# Patient Record
Sex: Male | Born: 1965
Health system: Southern US, Community
[De-identification: ages and names within clinical notes are randomized; demographics above are authoritative.]

## PROBLEM LIST (undated history)

## (undated) DIAGNOSIS — K859 Acute pancreatitis without necrosis or infection, unspecified: Secondary | ICD-10-CM

## (undated) DIAGNOSIS — I10 Essential (primary) hypertension: Secondary | ICD-10-CM

## (undated) DIAGNOSIS — E119 Type 2 diabetes mellitus without complications: Secondary | ICD-10-CM

## (undated) DIAGNOSIS — M109 Gout, unspecified: Secondary | ICD-10-CM

## (undated) HISTORY — PX: CHOLECYSTECTOMY: SHX55

---

## 2001-08-28 ENCOUNTER — Ambulatory Visit (HOSPITAL_COMMUNITY): Admission: RE | Admit: 2001-08-28 | Discharge: 2001-08-28 | Payer: Self-pay | Admitting: Internal Medicine

## 2011-05-25 ENCOUNTER — Other Ambulatory Visit: Payer: Self-pay | Admitting: Physician Assistant

## 2011-07-16 ENCOUNTER — Other Ambulatory Visit: Payer: Self-pay | Admitting: Physician Assistant

## 2011-08-11 ENCOUNTER — Other Ambulatory Visit: Payer: Self-pay | Admitting: Physician Assistant

## 2011-08-17 ENCOUNTER — Other Ambulatory Visit: Payer: Self-pay | Admitting: Physician Assistant

## 2011-09-08 ENCOUNTER — Other Ambulatory Visit: Payer: Self-pay | Admitting: Physician Assistant

## 2011-10-30 ENCOUNTER — Other Ambulatory Visit: Payer: Self-pay | Admitting: Physician Assistant

## 2013-07-14 ENCOUNTER — Ambulatory Visit: Payer: Self-pay | Admitting: Physician Assistant

## 2014-09-17 ENCOUNTER — Ambulatory Visit: Payer: Self-pay | Admitting: Internal Medicine

## 2014-09-28 ENCOUNTER — Ambulatory Visit: Payer: Self-pay | Admitting: Family

## 2015-05-12 ENCOUNTER — Ambulatory Visit: Payer: Self-pay | Admitting: Internal Medicine

## 2018-02-17 ENCOUNTER — Ambulatory Visit: Payer: Self-pay | Admitting: Family

## 2018-09-12 ENCOUNTER — Other Ambulatory Visit: Payer: Self-pay

## 2018-09-12 ENCOUNTER — Ambulatory Visit (HOSPITAL_COMMUNITY)
Admission: EM | Admit: 2018-09-12 | Discharge: 2018-09-12 | Disposition: A | Discharge status: Home / Self Care | Payer: BC Managed Care – PPO | Attending: Family Medicine | Admitting: Family Medicine

## 2018-09-12 ENCOUNTER — Encounter (HOSPITAL_COMMUNITY): Payer: Self-pay

## 2018-09-12 DIAGNOSIS — N4889 Other specified disorders of penis: Secondary | ICD-10-CM | POA: Diagnosis present

## 2018-09-12 HISTORY — DX: Gout, unspecified: M10.9

## 2018-09-12 HISTORY — DX: Type 2 diabetes mellitus without complications: E11.9

## 2018-09-12 LAB — POCT URINALYSIS DIP (DEVICE)
Glucose, UA: 500 mg/dL — AB
Hgb urine dipstick: NEGATIVE
Ketones, ur: 15 mg/dL — AB
Leukocytes,Ua: NEGATIVE
Nitrite: NEGATIVE
Protein, ur: 30 mg/dL — AB
Specific Gravity, Urine: 1.025 (ref 1.005–1.030)
Urobilinogen, UA: 0.2 mg/dL (ref 0.0–1.0)
pH: 5.5 (ref 5.0–8.0)

## 2018-09-12 MED ORDER — CLOTRIMAZOLE 1 % EX OINT: 1.0000 "application " | TOPICAL_OINTMENT | Freq: Two times a day (BID) | CUTANEOUS | 0 refills | Status: DC

## 2018-09-12 NOTE — ED Provider Notes (Signed)
[facility]    CSN: [id number] Arrival date & time: 09/12/18  4492     History   Chief Complaint Chief Complaint  Patient presents with  . Urinary Tract Infection    HPI Caleb Chambers is a 53 y.o. male.   Caleb Chambers presents with complaints of penile irritation which started approximately 6 days ago. Throbbing discomfort. Redness to tip of penis. Painful even when he showered today, burning. Denies lesions or pustules. Denies  Any previous similar. No pain with urination. No urinary frequency but states he does have the sensation of needing to urinate. Has been drinking plenty of water. Started insulin 2.5 weeks ago and he was concerned this was possible source. He is not sexually active. Denies any regular masturbation as he states he has difficulty with arousal overall. No back pain, no abdominal pain. No penile discharge. No fevers. No drainage from the affected area.     ROS per HPI, negative if not otherwise mentioned.      Past Medical History:  Diagnosis Date  . Diabetes mellitus without complication (HCC)   . Gout     There are no active problems to display for this patient.   History reviewed. No pertinent surgical history.     Home Medications    Prior to Admission medications   Medication Sig Start Date End Date Taking? Authorizing Provider  allopurinol (ZYLOPRIM) 100 MG tablet Take 100 mg by mouth daily.   Yes [provider]  bumetanide (BUMEX) 0.5 MG tablet Take 0.5 mg by mouth daily.   Yes [provider]  insulin glargine (LANTUS) 100 UNIT/ML injection Inject into the skin daily.   Yes [provider]  lisinopril (ZESTRIL) 10 MG tablet Take 10 mg by mouth daily.   Yes [provider]  metFORMIN (GLUCOPHAGE) 850 MG tablet Take 500 mg by mouth 2 (two) times daily with a meal.    Yes [provider]  metFORMIN (GLUCOPHAGE-XR) 500 MG 24 hr tablet Take 500 mg by mouth daily with breakfast.    Yes [provider]  Clotrimazole 1 % OINT Apply 1 application topically 2 (two) times a day. 09/12/18   Georgetta Haber, NP  colchicine (COLCRYS) 0.6 MG tablet Take 1 tablet (0.6 mg total) by mouth 2 (two) times daily. Needs office visit (second notice) 10/30/11   Nelva Nay, PA-C    Family History Family History  Problem Relation Age of Onset  . Healthy Mother   . Hypertension Father     Social History Social History   Tobacco Use  . Smoking status: Never Smoker  . Smokeless tobacco: Never Used  Substance Use Topics  . Alcohol use: Not Currently  . Drug use: Not on file     Allergies   Penicillins   Review of Systems Review of Systems   Physical Exam Triage Vital Signs ED Triage Vitals  Enc Vitals Group     BP 09/12/18 0937 (!) 153/91     Pulse Rate 09/12/18 0932 (!) 111     Resp 09/12/18 0932 18     Temp 09/12/18 0932 98.8 F (37.1 C)     Temp Source 09/12/18 0932 Oral     SpO2 09/12/18 0932 100 %     Weight --      Height --      Head Circumference --      Peak Flow --      Pain Score 09/12/18 0926 0  Pain Loc --      Pain Edu? --      Excl. in GC? --    No data found.  Updated Vital Signs BP (!) 153/91   Pulse (!) 111 Comment: pt states he gets nervous in medical offices  Temp 98.8 F (37.1 C) (Oral)   Resp 18   SpO2 100%   Visual Acuity Right Eye Distance:   Left Eye Distance:   Bilateral Distance:    Right Eye Near:   Left Eye Near:    Bilateral Near:     Physical Exam Exam conducted with a chaperone present.  Constitutional:      Appearance: He is well-developed.  Cardiovascular:     Rate and Rhythm: Regular rhythm. Tachycardia present.  Pulmonary:     Effort: Pulmonary effort is normal.     Breath sounds: Normal breath sounds.  Genitourinary:    Penis: Circumcised.      Comments: Skin breakdown with redness and excoriation to glans penis in almost entire circumference of glans; no vesicles, ulceration or  pustules  Skin:    General: Skin is warm and dry.  Neurological:     Mental Status: He is alert and oriented to person, place, and time.      UC Treatments / Results  Labs (all labs ordered are listed, but only abnormal results are displayed) Labs Reviewed  POCT URINALYSIS DIP (DEVICE) - Abnormal; Notable for the following components:      Result Value   Glucose, UA 500 (*)    Bilirubin Urine SMALL (*)    Ketones, ur 15 (*)    Protein, ur 30 (*)    All other components within normal limits  URINE CYTOLOGY ANCILLARY ONLY    EKG None  Radiology No results found.  Procedures Procedures (including critical care time)  Medications Ordered in UC Medications - No data to display  Initial Impression / Assessment and Plan / UC Course  I have reviewed the triage vital signs and the nursing notes.  Pertinent labs & imaging results that were available during my care of the patient were reviewed by me and considered in my medical decision making (see chart for details).     Patient with excoriation, redness and rash to glans penis. Urine cytology collected and pending as well. Will start with clotrimazole with presence of elevated blood sugars. Low suspicion for HSV at this time. Patient endorses sensation of anxiety with being seen for this in clinic as evidenced by tachycardia, with chart review has had hr 109 in clinic in the past as well. Continue to follow with PCP as may need change in management if no improvement or if worsening. Skin care discussed. Patient verbalized understanding and agreeable to plan.   Final Clinical Impressions(s) / UC Diagnoses   Final diagnoses:  Penile irritation     Discharge Instructions     Use of cream as provided.  Avoid friction, moisture and heat as able.  Please follow up for recheck, especially if no improvement.    ED Prescriptions    Medication Sig Dispense Auth. Provider   Clotrimazole 1 % OINT Apply 1 application topically 2  (two) times a day. 1 Tube Georgetta Haber, NP     Controlled Substance Prescriptions [place] Controlled Substance Registry consulted? Not Applicable   Georgetta Haber, NP 09/12/18 1024

## 2018-09-12 NOTE — Discharge Instructions (Signed)
Use of cream as provided.  Avoid friction, moisture and heat as able.  Please follow up for recheck, especially if no improvement.

## 2018-09-12 NOTE — ED Triage Notes (Signed)
Patient presents to Urgent Care with complaints of burning sensation around his penis throughout the day since starting insulin 4 days ago. Patient reports he does not have burning DURING urination, and has not had sexual intercourse in a "very very very long time" and does not think it is a STD. Pt was also started on a diuretic and urinates very frequently now.

## 2018-09-15 LAB — URINE CYTOLOGY ANCILLARY ONLY
Chlamydia: NEGATIVE
Neisseria Gonorrhea: NEGATIVE
Trichomonas: NEGATIVE

## 2018-09-16 LAB — URINE CYTOLOGY ANCILLARY ONLY: Candida vaginitis: NEGATIVE

## 2019-04-20 ENCOUNTER — Emergency Department (HOSPITAL_COMMUNITY): Payer: BC Managed Care – PPO

## 2019-04-20 ENCOUNTER — Inpatient Hospital Stay (HOSPITAL_COMMUNITY)
Admission: EM | Admit: 2019-04-20 | Discharge: 2019-04-29 | DRG: 438 | Disposition: A | Discharge status: Home / Self Care | Payer: BC Managed Care – PPO | Attending: Internal Medicine | Admitting: Internal Medicine

## 2019-04-20 ENCOUNTER — Ambulatory Visit (HOSPITAL_COMMUNITY)
Admission: EM | Admit: 2019-04-20 | Discharge: 2019-04-20 | Disposition: A | Discharge status: Home / Self Care | Payer: BC Managed Care – PPO | Source: Home / Self Care

## 2019-04-20 ENCOUNTER — Other Ambulatory Visit: Payer: Self-pay

## 2019-04-20 ENCOUNTER — Encounter (HOSPITAL_COMMUNITY): Payer: Self-pay | Admitting: *Deleted

## 2019-04-20 DIAGNOSIS — M109 Gout, unspecified: Secondary | ICD-10-CM | POA: Diagnosis present

## 2019-04-20 DIAGNOSIS — E8809 Other disorders of plasma-protein metabolism, not elsewhere classified: Secondary | ICD-10-CM | POA: Diagnosis not present

## 2019-04-20 DIAGNOSIS — K5090 Crohn's disease, unspecified, without complications: Secondary | ICD-10-CM | POA: Diagnosis present

## 2019-04-20 DIAGNOSIS — Z6841 Body Mass Index (BMI) 40.0 and over, adult: Secondary | ICD-10-CM

## 2019-04-20 DIAGNOSIS — E8770 Fluid overload, unspecified: Secondary | ICD-10-CM | POA: Diagnosis not present

## 2019-04-20 DIAGNOSIS — T383X6A Underdosing of insulin and oral hypoglycemic [antidiabetic] drugs, initial encounter: Secondary | ICD-10-CM | POA: Diagnosis present

## 2019-04-20 DIAGNOSIS — R0602 Shortness of breath: Secondary | ICD-10-CM | POA: Diagnosis not present

## 2019-04-20 DIAGNOSIS — R188 Other ascites: Secondary | ICD-10-CM | POA: Diagnosis present

## 2019-04-20 DIAGNOSIS — Z881 Allergy status to other antibiotic agents status: Secondary | ICD-10-CM

## 2019-04-20 DIAGNOSIS — E860 Dehydration: Secondary | ICD-10-CM | POA: Diagnosis present

## 2019-04-20 DIAGNOSIS — Z794 Long term (current) use of insulin: Secondary | ICD-10-CM | POA: Diagnosis not present

## 2019-04-20 DIAGNOSIS — I10 Essential (primary) hypertension: Secondary | ICD-10-CM | POA: Diagnosis present

## 2019-04-20 DIAGNOSIS — N179 Acute kidney failure, unspecified: Secondary | ICD-10-CM | POA: Diagnosis present

## 2019-04-20 DIAGNOSIS — Z880 Allergy status to penicillin: Secondary | ICD-10-CM

## 2019-04-20 DIAGNOSIS — Z20822 Contact with and (suspected) exposure to covid-19: Secondary | ICD-10-CM | POA: Diagnosis present

## 2019-04-20 DIAGNOSIS — K851 Biliary acute pancreatitis without necrosis or infection: Secondary | ICD-10-CM | POA: Diagnosis not present

## 2019-04-20 DIAGNOSIS — Z91138 Patient's unintentional underdosing of medication regimen for other reason: Secondary | ICD-10-CM | POA: Diagnosis not present

## 2019-04-20 DIAGNOSIS — Z888 Allergy status to other drugs, medicaments and biological substances status: Secondary | ICD-10-CM

## 2019-04-20 DIAGNOSIS — K859 Acute pancreatitis without necrosis or infection, unspecified: Secondary | ICD-10-CM | POA: Diagnosis present

## 2019-04-20 DIAGNOSIS — R1013 Epigastric pain: Secondary | ICD-10-CM | POA: Diagnosis not present

## 2019-04-20 DIAGNOSIS — Z8249 Family history of ischemic heart disease and other diseases of the circulatory system: Secondary | ICD-10-CM

## 2019-04-20 DIAGNOSIS — Z9049 Acquired absence of other specified parts of digestive tract: Secondary | ICD-10-CM | POA: Diagnosis not present

## 2019-04-20 DIAGNOSIS — R935 Abnormal findings on diagnostic imaging of other abdominal regions, including retroperitoneum: Secondary | ICD-10-CM

## 2019-04-20 DIAGNOSIS — E876 Hypokalemia: Secondary | ICD-10-CM | POA: Diagnosis present

## 2019-04-20 DIAGNOSIS — K760 Fatty (change of) liver, not elsewhere classified: Secondary | ICD-10-CM | POA: Diagnosis present

## 2019-04-20 DIAGNOSIS — E1110 Type 2 diabetes mellitus with ketoacidosis without coma: Secondary | ICD-10-CM

## 2019-04-20 DIAGNOSIS — E871 Hypo-osmolality and hyponatremia: Secondary | ICD-10-CM | POA: Diagnosis not present

## 2019-04-20 DIAGNOSIS — Z79899 Other long term (current) drug therapy: Secondary | ICD-10-CM

## 2019-04-20 LAB — CBG MONITORING, ED
Glucose-Capillary: >600 mg/dL (ref 70–99)
Glucose-Capillary: >600 mg/dL (ref 70–99)
Glucose-Capillary: >600 mg/dL (ref 70–99)
Glucose-Capillary: >600 mg/dL (ref 70–99)
Glucose-Capillary: 546 mg/dL (ref 70–99)
Glucose-Capillary: 513 mg/dL (ref 70–99)
Glucose-Capillary: 425 mg/dL — ABNORMAL HIGH (ref 70–99)
Glucose-Capillary: 362 mg/dL — ABNORMAL HIGH (ref 70–99)
Glucose-Capillary: 307 mg/dL — ABNORMAL HIGH (ref 70–99)
Glucose-Capillary: 319 mg/dL — ABNORMAL HIGH (ref 70–99)

## 2019-04-20 LAB — LIPID PANEL
Cholesterol: 98 mg/dL (ref 0–200)
HDL: 39 mg/dL — ABNORMAL LOW (ref >40)
LDL Cholesterol: 45 mg/dL (ref 0–99)
Total CHOL/HDL Ratio: 2.5 RATIO
Triglycerides: 70 mg/dL (ref <150)
VLDL: 14 mg/dL (ref 0–40)

## 2019-04-20 LAB — COMPREHENSIVE METABOLIC PANEL
ALT: 45 U/L — ABNORMAL HIGH (ref 0–44)
AST: 51 U/L — ABNORMAL HIGH (ref 15–41)
Albumin: 4 g/dL (ref 3.5–5.0)
Alkaline Phosphatase: 93 U/L (ref 38–126)
Anion gap: 26 — ABNORMAL HIGH (ref 5–15)
BUN: 28 mg/dL — ABNORMAL HIGH (ref 6–20)
CO2: 10 mmol/L — ABNORMAL LOW (ref 22–32)
Calcium: 9 mg/dL (ref 8.9–10.3)
Chloride: 93 mmol/L — ABNORMAL LOW (ref 98–111)
Creatinine, Ser: 1.77 mg/dL — ABNORMAL HIGH (ref 0.61–1.24)
GFR calc Af Amer: 50 mL/min — ABNORMAL LOW (ref >60)
GFR calc non Af Amer: 43 mL/min — ABNORMAL LOW (ref >60)
Glucose, Bld: 870 mg/dL (ref 70–99)
Potassium: 5.1 mmol/L (ref 3.5–5.1)
Sodium: 129 mmol/L — ABNORMAL LOW (ref 135–145)
Total Bilirubin: 1.4 mg/dL — ABNORMAL HIGH (ref 0.3–1.2)
Total Protein: 7.9 g/dL (ref 6.5–8.1)

## 2019-04-20 LAB — POCT I-STAT EG7
Acid-base deficit: 13 mmol/L — ABNORMAL HIGH (ref 0.0–2.0)
Bicarbonate: 9.8 mmol/L — ABNORMAL LOW (ref 20.0–28.0)
Calcium, Ion: 0.84 mmol/L — CL (ref 1.15–1.40)
HCT: 56 % — ABNORMAL HIGH (ref 39.0–52.0)
Hemoglobin: 19 g/dL — ABNORMAL HIGH (ref 13.0–17.0)
O2 Saturation: 98 %
Potassium: 4.9 mmol/L (ref 3.5–5.1)
Sodium: 124 mmol/L — ABNORMAL LOW (ref 135–145)
TCO2: 10 mmol/L — ABNORMAL LOW (ref 22–32)
pCO2, Ven: 19.5 mmHg — CL (ref 44.0–60.0)
pH, Ven: 7.309 (ref 7.250–7.430)
pO2, Ven: 104 mmHg — ABNORMAL HIGH (ref 32.0–45.0)

## 2019-04-20 LAB — CBC
HCT: 55.8 % — ABNORMAL HIGH (ref 39.0–52.0)
Hemoglobin: 17.5 g/dL — ABNORMAL HIGH (ref 13.0–17.0)
MCH: 25.3 pg — ABNORMAL LOW (ref 26.0–34.0)
MCHC: 31.4 g/dL (ref 30.0–36.0)
MCV: 80.6 fL (ref 80.0–100.0)
Platelets: 302 10*3/uL (ref 150–400)
RBC: 6.92 MIL/uL — ABNORMAL HIGH (ref 4.22–5.81)
RDW: 14.6 % (ref 11.5–15.5)
WBC: 15.7 10*3/uL — ABNORMAL HIGH (ref 4.0–10.5)
nRBC: 0 % (ref 0.0–0.2)

## 2019-04-20 LAB — URINALYSIS, ROUTINE W REFLEX MICROSCOPIC
Bilirubin Urine: NEGATIVE
Glucose, UA: >=500 mg/dL — AB
Ketones, ur: 5 mg/dL — AB
Leukocytes,Ua: NEGATIVE
Nitrite: NEGATIVE
Protein, ur: NEGATIVE mg/dL
Specific Gravity, Urine: 1.022 (ref 1.005–1.030)
pH: 5 (ref 5.0–8.0)

## 2019-04-20 LAB — BASIC METABOLIC PANEL
Anion gap: 21 — ABNORMAL HIGH (ref 5–15)
BUN: 30 mg/dL — ABNORMAL HIGH (ref 6–20)
CO2: 9 mmol/L — ABNORMAL LOW (ref 22–32)
Calcium: 7.2 mg/dL — ABNORMAL LOW (ref 8.9–10.3)
Chloride: 100 mmol/L (ref 98–111)
Creatinine, Ser: 1.81 mg/dL — ABNORMAL HIGH (ref 0.61–1.24)
GFR calc Af Amer: 49 mL/min — ABNORMAL LOW (ref >60)
GFR calc non Af Amer: 42 mL/min — ABNORMAL LOW (ref >60)
Glucose, Bld: 693 mg/dL (ref 70–99)
Potassium: 5.9 mmol/L — ABNORMAL HIGH (ref 3.5–5.1)
Sodium: 130 mmol/L — ABNORMAL LOW (ref 135–145)

## 2019-04-20 LAB — BETA-HYDROXYBUTYRIC ACID
Beta-Hydroxybutyric Acid: 4.5 mmol/L — ABNORMAL HIGH (ref 0.05–0.27)
Beta-Hydroxybutyric Acid: 2.11 mmol/L — ABNORMAL HIGH (ref 0.05–0.27)

## 2019-04-20 LAB — RESPIRATORY PANEL BY RT PCR (FLU A&B, COVID)
Influenza A by PCR: NEGATIVE
Influenza B by PCR: NEGATIVE
SARS Coronavirus 2 by RT PCR: NEGATIVE

## 2019-04-20 LAB — LACTIC ACID, PLASMA: Lactic Acid, Venous: 5.4 mmol/L (ref 0.5–1.9)

## 2019-04-20 LAB — MAGNESIUM: Magnesium: 2.2 mg/dL (ref 1.7–2.4)

## 2019-04-20 LAB — LIPASE, BLOOD: Lipase: 1319 U/L — ABNORMAL HIGH (ref 11–51)

## 2019-04-20 LAB — PHOSPHORUS: Phosphorus: 3.5 mg/dL (ref 2.5–4.6)

## 2019-04-20 LAB — HIV ANTIBODY (ROUTINE TESTING W REFLEX): HIV Screen 4th Generation wRfx: NONREACTIVE

## 2019-04-20 MED ORDER — INSULIN REGULAR(HUMAN) IN NACL 100-0.9 UT/100ML-% IV SOLN
INTRAVENOUS | Status: DC
  Administered 2019-04-20: 13 [IU]/h via INTRAVENOUS
  Administered 2019-04-22: 4.8 [IU]/h via INTRAVENOUS
  Filled 2019-04-20 (×5): qty 100

## 2019-04-20 MED ORDER — HYDROMORPHONE HCL 1 MG/ML IJ SOLN
1.0000 mg | Freq: Once | INTRAMUSCULAR | Status: AC
  Administered 2019-04-20: 1 mg via INTRAVENOUS
  Filled 2019-04-20: qty 1

## 2019-04-20 MED ORDER — LACTATED RINGERS IV SOLN: INTRAVENOUS | Status: DC

## 2019-04-20 MED ORDER — NALOXONE HCL 0.4 MG/ML IJ SOLN: 0.4000 mg | INTRAMUSCULAR | Status: DC | PRN

## 2019-04-20 MED ORDER — ACETAMINOPHEN 325 MG PO TABS
650.0000 mg | ORAL_TABLET | Freq: Four times a day (QID) | ORAL | Status: DC | PRN
  Administered 2019-04-29: 650 mg via ORAL
  Filled 2019-04-20: qty 2

## 2019-04-20 MED ORDER — LACTATED RINGERS IV BOLUS: 1000.0000 mL | Freq: Once | INTRAVENOUS | Status: DC

## 2019-04-20 MED ORDER — SODIUM CHLORIDE 0.9 % IV BOLUS
2000.0000 mL | Freq: Once | INTRAVENOUS | Status: AC
  Administered 2019-04-20: 13:00:00 2000 mL via INTRAVENOUS

## 2019-04-20 MED ORDER — HEPARIN SODIUM (PORCINE) 5000 UNIT/ML IJ SOLN
5000.0000 [IU] | Freq: Three times a day (TID) | INTRAMUSCULAR | Status: DC
  Administered 2019-04-20 – 2019-04-29 (×25): 5000 [IU] via SUBCUTANEOUS
  Filled 2019-04-20 (×25): qty 1

## 2019-04-20 MED ORDER — ALLOPURINOL 100 MG PO TABS
100.0000 mg | ORAL_TABLET | Freq: Every day | ORAL | Status: DC
  Administered 2019-04-22 – 2019-04-29 (×8): 100 mg via ORAL
  Filled 2019-04-20 (×9): qty 1

## 2019-04-20 MED ORDER — HYDROMORPHONE HCL 1 MG/ML IJ SOLN: 0.5000 mg | INTRAMUSCULAR | Status: DC | PRN

## 2019-04-20 MED ORDER — DIPHENHYDRAMINE HCL 12.5 MG/5ML PO ELIX: 12.5000 mg | ORAL_SOLUTION | Freq: Four times a day (QID) | ORAL | Status: DC | PRN

## 2019-04-20 MED ORDER — LACTATED RINGERS IV BOLUS
1000.0000 mL | Freq: Once | INTRAVENOUS | Status: AC
  Administered 2019-04-20: 1000 mL via INTRAVENOUS

## 2019-04-20 MED ORDER — DEXTROSE-NACL 5-0.45 % IV SOLN: INTRAVENOUS | Status: DC

## 2019-04-20 MED ORDER — DIPHENHYDRAMINE HCL 50 MG/ML IJ SOLN: 12.5000 mg | Freq: Four times a day (QID) | INTRAMUSCULAR | Status: DC | PRN

## 2019-04-20 MED ORDER — SODIUM CHLORIDE 0.9% FLUSH
3.0000 mL | Freq: Once | INTRAVENOUS | Status: AC
  Administered 2019-04-20: 3 mL via INTRAVENOUS

## 2019-04-20 MED ORDER — HYDROMORPHONE 1 MG/ML IV SOLN
INTRAVENOUS | Status: DC
  Administered 2019-04-21 (×2): 3.8 mg via INTRAVENOUS
  Administered 2019-04-22: 0.9 mg via INTRAVENOUS
  Administered 2019-04-22: 2.1 mg via INTRAVENOUS
  Filled 2019-04-20 (×10): qty 30

## 2019-04-20 MED ORDER — ONDANSETRON HCL 4 MG/2ML IJ SOLN
4.0000 mg | Freq: Four times a day (QID) | INTRAMUSCULAR | Status: DC | PRN
  Administered 2019-04-23: 4 mg via INTRAVENOUS
  Filled 2019-04-20: qty 2

## 2019-04-20 MED ORDER — SODIUM CHLORIDE 0.9 % IV SOLN: INTRAVENOUS | Status: DC

## 2019-04-20 MED ORDER — SODIUM CHLORIDE 0.9% FLUSH: 9.0000 mL | INTRAVENOUS | Status: DC | PRN

## 2019-04-20 MED ORDER — ONDANSETRON HCL 4 MG/2ML IJ SOLN
4.0000 mg | Freq: Once | INTRAMUSCULAR | Status: AC
  Administered 2019-04-20: 4 mg via INTRAVENOUS
  Filled 2019-04-20: qty 2

## 2019-04-20 MED ORDER — DEXTROSE 50 % IV SOLN: 0.0000 mL | INTRAVENOUS | Status: DC | PRN

## 2019-04-20 MED ORDER — SODIUM CHLORIDE 0.9 % IV BOLUS
1000.0000 mL | Freq: Once | INTRAVENOUS | Status: AC
  Administered 2019-04-21: 1000 mL via INTRAVENOUS

## 2019-04-20 MED ORDER — HYDROMORPHONE HCL 1 MG/ML IJ SOLN
1.0000 mg | INTRAMUSCULAR | Status: DC | PRN
  Administered 2019-04-20 – 2019-04-21 (×4): 1 mg via INTRAVENOUS
  Filled 2019-04-20 (×4): qty 1

## 2019-04-20 MED ORDER — ONDANSETRON HCL 4 MG/2ML IJ SOLN
4.0000 mg | Freq: Four times a day (QID) | INTRAMUSCULAR | Status: DC | PRN
  Administered 2019-04-20: 4 mg via INTRAVENOUS
  Filled 2019-04-20: qty 2

## 2019-04-20 NOTE — ED Notes (Signed)
Please call mom for updates at [phone]

## 2019-04-20 NOTE — ED Triage Notes (Signed)
Pt reports having upper abd pain today with n/v. Pt feeling dehydrated and now sob.

## 2019-04-20 NOTE — Consult Note (Deleted)
Referring Provider:  [facility]         Primary Care Physician:  Patient, No Pcp Per Primary Gastroenterologist:              We were asked to see this patient for:  pancreatitis           ASSESSMENT /  PLAN     54 yo male with pmh significant for obesity, HTN, DM1, gout.   1. Acute pancreatitis with SIRS / elevated HCT and elevated BUN. Elevated HCT probably from DKA, but ? some third spacing from pancreatitis.. No acute fluid collections or necrosis on CTscan.  Etiology of pancreatitis, possibly hypertriglyceridemia.  Non-drinker and hx of cholecystectomy. Liver tests minimally elevated do doubt biliary pancreatitis. He does take an ACE inhibitor which has been associated with pancreatitis.  -- check triglycerides --Aggressive fluid resuscitation in progress. Getting 2 L bolus right now. Following that is scheduled for 75 ml / hr. Will increase that rate to 125 / hr.  --Monitor BUN, HCt -- monitor urinary output --pain control. He has Dilaudid ordered  2. DM, in DKA with metaabolic acidosis. Getting insulin drip.  3. Hyponatremia in setting of hyperglycemia. Glucose 870, corrected sodium 141  4. Hypocalemia. Ionized Ca+ low, ? Secondary to #1.   5. AKI, on CKD?   5. Severe steatosis on CTscan. Risk factors: diabetes, obesity. Minimal elevated in enzymes  HPI:    Chief Complaint:  Abdominal pain, nausea and vomitng  Caleb Chambers is a 54 y.o. male presenting to ED with two days history of nausea / vomiting and severe upper abdominal pain. He had loose stools last night but none today. In ED his white count is elevated, he is tachycardic with elevated HCT and BUN. He is in DKA. CT scan >>> pancreatitis. No previous history of pancreatitis. No FMH of pancreatitic problems. Saud says he felt fine prior to onset of pain two days ago. The pain is stabbing. It spans across upper abdomen, doesn't radiate and unrelieved with Tylenol at home.     Past Medical History:    Diagnosis Date  . Diabetes mellitus without complication (HCC)   . Gout    PSH:  cholecystectomy   Prior to Admission medications   Medication Sig Start Date End Date Taking? Authorizing Provider  allopurinol (ZYLOPRIM) 100 MG tablet Take 100 mg by mouth daily.    [provider]  bumetanide (BUMEX) 0.5 MG tablet Take 0.5 mg by mouth daily.    [provider]  Clotrimazole 1 % OINT Apply 1 application topically 2 (two) times a day. 09/12/18   Georgetta Haber, NP  colchicine (COLCRYS) 0.6 MG tablet Take 1 tablet (0.6 mg total) by mouth 2 (two) times daily. Needs office visit (second notice) 10/30/11   Nelva Nay, PA-C  insulin glargine (LANTUS) 100 UNIT/ML injection Inject into the skin daily.    [provider]  lisinopril (ZESTRIL) 10 MG tablet Take 10 mg by mouth daily.    [provider]  metFORMIN (GLUCOPHAGE) 850 MG tablet Take 500 mg by mouth 2 (two) times daily with a meal.     [provider]  metFORMIN (GLUCOPHAGE-XR) 500 MG 24 hr tablet Take 500 mg by mouth daily with breakfast.    [provider]    Current Facility-Administered Medications  Medication Dose Route Frequency Provider Last Rate Last Admin  . 0.9 %  sodium chloride infusion   Intravenous Continuous Pricilla Loveless, MD      .  acetaminophen (TYLENOL) tablet 650 mg  650 mg Oral Q6H PRN Mikey College T, MD      . Melene Muller ON 04/21/2019] allopurinol (ZYLOPRIM) tablet 100 mg  100 mg Oral Daily Zhang, Ilda Foil T, MD      . dextrose 5 %-0.45 % sodium chloride infusion   Intravenous Continuous Pricilla Loveless, MD      . dextrose 50 % solution 0-50 mL  0-50 mL Intravenous PRN Pricilla Loveless, MD      . heparin injection 5,000 Units  5,000 Units Subcutaneous Q8H Mikey College T, MD      . HYDROmorphone (DILAUDID) injection 0.5 mg  0.5 mg Intravenous Q4H PRN Mikey College T, MD      . HYDROmorphone (DILAUDID) injection 1 mg  1 mg Intravenous Q4H PRN Mikey College T, MD      .  insulin regular, human (MYXREDLIN) 100 units/ 100 mL infusion   Intravenous Continuous Pricilla Loveless, MD 13 mL/hr at 04/20/19 1446 13 Units/hr at 04/20/19 1446  . ondansetron (ZOFRAN) injection 4 mg  4 mg Intravenous Q6H PRN Emeline General, MD       Current Outpatient Medications  Medication Sig Dispense Refill  . allopurinol (ZYLOPRIM) 100 MG tablet Take 100 mg by mouth daily.    . bumetanide (BUMEX) 0.5 MG tablet Take 0.5 mg by mouth daily.    . Clotrimazole 1 % OINT Apply 1 application topically 2 (two) times a day. 1 Tube 0  . colchicine (COLCRYS) 0.6 MG tablet Take 1 tablet (0.6 mg total) by mouth 2 (two) times daily. Needs office visit (second notice) 30 tablet 0  . insulin glargine (LANTUS) 100 UNIT/ML injection Inject into the skin daily.    [name] lisinopril (ZESTRIL) 10 MG tablet Take 10 mg by mouth daily.    . metFORMIN (GLUCOPHAGE) 850 MG tablet Take 500 mg by mouth 2 (two) times daily with a meal.     . metFORMIN (GLUCOPHAGE-XR) 500 MG 24 hr tablet Take 500 mg by mouth daily with breakfast.      Allergies as of 04/20/2019 - Review Complete 04/20/2019  Allergen Reaction Noted  . Penicillins Anaphylaxis and Hives 09/12/2018    Family History  Problem Relation Age of Onset  . Healthy Mother   . Hypertension Father     Social History   Socioeconomic History  . Marital status: Single    Spouse name: Not on file  . Number of children: Not on file  . Years of education: Not on file  . Highest education level: Not on file  Occupational History  . Not on file  Tobacco Use  . Smoking status: Never Smoker  . Smokeless tobacco: Never Used  Substance and Sexual Activity  . Alcohol use: Not Currently  . Drug use: Not on file  . Sexual activity: Not Currently  Other Topics Concern  . Not on file  Social History Narrative  . Not on file   Social Determinants of Health   Financial Resource Strain:   . Difficulty of Paying Living Expenses: Not on file  Food Insecurity:   .  Worried About Programme researcher, broadcasting/film/video in the Last Year: Not on file  . Ran Out of Food in the Last Year: Not on file  Transportation Needs:   . Lack of Transportation (Medical): Not on file  . Lack of Transportation (Non-Medical): Not on file  Physical Activity:   . Days of Exercise per Week: Not on file  . Minutes of Exercise per  Session: Not on file  Stress:   . Feeling of Stress : Not on file  Social Connections:   . Frequency of Communication with Friends and Family: Not on file  . Frequency of Social Gatherings with Friends and Family: Not on file  . Attends Religious Services: Not on file  . Active Member of Clubs or Organizations: Not on file  . Attends Archivist Meetings: Not on file  . Marital Status: Not on file  Intimate Partner Violence:   . Fear of Current or Ex-Partner: Not on file  . Emotionally Abused: Not on file  . Physically Abused: Not on file  . Sexually Abused: Not on file    Review of Systems: All systems reviewed and negative except where noted in HPI.  Physical Exam: Vital signs in last 24 hours: Temp:  [97.8 F (36.6 C)] 97.8 F (36.6 C) (01/04 0844) Pulse Rate:  [111-131] 111 (01/04 1430) Resp:  [22-32] 32 (01/04 1430) BP: (127-131)/(78-87) 127/78 (01/04 1430) SpO2:  [99 %] 99 % (01/04 1430)   General:   Alert, obese male in significant physical discomfort from abdominal pain.  Psych:  Pleasant, cooperative. Normal mood and affect. Eyes:  Pupils equal, sclera clear, no icterus.   Conjunctiva pink. Ears:  Normal auditory acuity. Nose:  No deformity, discharge,  or lesions. Neck:  Supple; no masses Lungs:  Clear throughout to auscultation.   No wheezes, crackles, or rhonchi.  Heart:  Sinus tachycardia. No lower extremity edema Abdomen:  Soft, non-distended, nontender, BS active, no palp mass   Rectal:  Deferred  Msk:  Symmetrical without gross deformities. . Neurologic:  Alert and  oriented x4;  grossly normal neurologically. Skin:   Intact without significant lesions or rashes.   Intake/Output from previous day: No intake/output data recorded. Intake/Output this shift: No intake/output data recorded.  Lab Results: Recent Labs    04/20/19 0923 04/20/19 1253  WBC 15.7*  --   HGB 17.5* 19.0*  HCT 55.8* 56.0*  PLT 302  --    BMET Recent Labs    04/20/19 0923 04/20/19 1253  NA 129* 124*  K 5.1 4.9  CL 93*  --   CO2 10*  --   GLUCOSE 870*  --   BUN 28*  --   CREATININE 1.77*  --   CALCIUM 9.0  --    LFT Recent Labs    04/20/19 0923  PROT 7.9  ALBUMIN 4.0  AST 51*  ALT 45*  ALKPHOS 93  BILITOT 1.4*   PT/INR No results for input(s): LABPROT, INR in the last 72 hours. Hepatitis Panel No results for input(s): HEPBSAG, HCVAB, HEPAIGM, HEPBIGM in the last 72 hours.   . CBC Latest Ref Rng & Units 04/20/2019 04/20/2019  WBC 4.0 - 10.5 K/uL - 15.7(H)  Hemoglobin 13.0 - 17.0 g/dL 19.0(H) 17.5(H)  Hematocrit 39.0 - 52.0 % 56.0(H) 55.8(H)  Platelets 150 - 400 K/uL - 302    . CMP Latest Ref Rng & Units 04/20/2019 04/20/2019  Glucose 70 - 99 mg/dL - 870(HH)  BUN 6 - 20 mg/dL - 28(H)  Creatinine 0.61 - 1.24 mg/dL - 1.77(H)  Sodium 135 - 145 mmol/L 124(L) 129(L)  Potassium 3.5 - 5.1 mmol/L 4.9 5.1  Chloride 98 - 111 mmol/L - 93(L)  CO2 22 - 32 mmol/L - 10(L)  Calcium 8.9 - 10.3 mg/dL - 9.0  Total Protein 6.5 - 8.1 g/dL - 7.9  Total Bilirubin 0.3 - 1.2 mg/dL - 1.4(H)  Alkaline  Phos 38 - 126 U/L - 93  AST 15 - 41 U/L - 51(H)  ALT 0 - 44 U/L - 45(H)   Studies/Results: CT ABDOMEN PELVIS WO CONTRAST  Addendum Date: 04/20/2019   ADDENDUM REPORT: 04/20/2019 13:40 ADDENDUM: The patient does have a midline skin lesion over the mid sternum measuring approximately 4.0 by 1.5 cm of unknown etiology. Electronically Signed   By: Francene Boyers M.D.   On: 04/20/2019 13:40   Result Date: 04/20/2019 CLINICAL DATA:  Upper abdominal pain with nausea and vomiting. Acute pancreatitis. EXAM: CT ABDOMEN AND PELVIS WITHOUT  CONTRAST TECHNIQUE: Multidetector CT imaging of the abdomen and pelvis was performed following the standard protocol without IV contrast. COMPARISON:  None. FINDINGS: Lower chest: No acute abnormality. Tiny calcified granuloma in the right lower lobe. No effusions. Hepatobiliary: Severe hepatic steatosis. Cholecystectomy. No focal liver lesions. No biliary ductal dilatation. Pancreas: Peripancreatic soft tissue edema with edema extending into the mesentery with a small amount of ascites around the spleen and around the inferior aspect of the right lobe of the liver. Spleen: Normal in size without focal abnormality. Adrenals/Urinary Tract: Adrenal glands are unremarkable. Kidneys are normal, without renal calculi, focal lesion, or hydronephrosis. Bladder is unremarkable. Stomach/Bowel: Stomach is within normal limits. Appendix appears normal. No evidence of bowel wall thickening, distention, or inflammatory changes. Vascular/Lymphatic: No significant vascular findings are present. No enlarged abdominal or pelvic lymph nodes. Reproductive: Prostate is unremarkable. Other: There is a small amount of ascites pericolic gutters and adjacent to the spleen and inferior aspect the liver as well as mesenteric edema, all felt pancreatitis. No abdominal wall hernias. Musculoskeletal: No acute or significant osseous findings. IMPRESSION: 1. Acute pancreatitis. 2. Severe hepatic steatosis. 3. Small amount of ascites. Electronically Signed: By: Francene Boyers M.D. On: 04/20/2019 13:14   DG Chest 2 View  Result Date: 04/20/2019 CLINICAL DATA:  Shortness of breath and dizziness EXAM: CHEST - 2 VIEW COMPARISON:  None. FINDINGS: Lungs are clear. Heart is upper normal in size with pulmonary vascularity normal. No adenopathy. No bone lesions. IMPRESSION: No edema or consolidation.  Heart upper normal in size. Electronically Signed   By: Bretta Bang III M.D.   On: 04/20/2019 10:03    Active Problems:   Acute pancreatitis    DKA (diabetic ketoacidoses) (HCC)    Willette Cluster, NP-C @  04/20/2019, 3:14 PM

## 2019-04-20 NOTE — H&P (Signed)
History and Physical    Caleb Chambers KZS:[id number] DOB: [birth year] DOA: 04/20/2019  PCP: Patient, No Pcp Per   Patient coming from: Home  I have personally briefly reviewed patient's old medical records in [facility]  Chief Complaint: ABd pain, nauseous vomiting diarrhea  HPI: Caleb Chambers is a 54 y.o. male with medical history significant of IDDM, HTN, abdominal pain and vomiting.  Started 2-3 days ago after heavy meal.  Pain is diffuse across his upper abdomen and severe, cramping like.  He has not taken anything specifically for it.  He has had 20-30 episodes of vomiting and loose diarrhea as well.  No fevers or cough. He stopped taking all his meds, including insulin and the Metformin since yesterday. ED Course: DKA, insulin drip was started. CT showed acute uncomplicated pancreatitis.  Review of Systems: As per HPI otherwise 10 point review of systems negative.    Past Medical History:  Diagnosis Date  . Diabetes mellitus without complication (HCC)   . Gout     History reviewed. No pertinent surgical history.   reports that he has never smoked. He has never used smokeless tobacco. He reports previous alcohol use. No history on file for drug.  Allergies  Allergen Reactions  . Penicillins Anaphylaxis and Hives    Family History  Problem Relation Age of Onset  . Healthy Mother   . Hypertension Father      Prior to Admission medications   Medication Sig Start Date End Date Taking? Authorizing Provider  allopurinol (ZYLOPRIM) 100 MG tablet Take 100 mg by mouth daily.    [provider]  bumetanide (BUMEX) 0.5 MG tablet Take 0.5 mg by mouth daily.    [provider]  Clotrimazole 1 % OINT Apply 1 application topically 2 (two) times a day. 09/12/18   Georgetta Haber, NP  colchicine (COLCRYS) 0.6 MG tablet Take 1 tablet (0.6 mg total) by mouth 2 (two) times daily. Needs office visit (second notice) 10/30/11   Nelva Nay, PA-C  insulin  glargine (LANTUS) 100 UNIT/ML injection Inject into the skin daily.    [provider]  lisinopril (ZESTRIL) 10 MG tablet Take 10 mg by mouth daily.    [provider]  metFORMIN (GLUCOPHAGE) 850 MG tablet Take 500 mg by mouth 2 (two) times daily with a meal.     [provider]  metFORMIN (GLUCOPHAGE-XR) 500 MG 24 hr tablet Take 500 mg by mouth daily with breakfast.    [provider]    Physical Exam: Vitals:   04/20/19 0844  BP: 131/87  Pulse: (!) 131  Resp: (!) 22  Temp: 97.8 F (36.6 C)  TempSrc: Oral  SpO2: 99%    Constitutional: NAD, calm, comfortable Vitals:   04/20/19 0844  BP: 131/87  Pulse: (!) 131  Resp: (!) 22  Temp: 97.8 F (36.6 C)  TempSrc: Oral  SpO2: 99%   Eyes: PERRL, lids and conjunctivae normal ENMT: Mucous membranes are dry. Posterior pharynx clear of any exudate or lesions.Normal dentition.  Neck: normal, supple, no masses, no thyromegaly Respiratory: clear to auscultation bilaterally, no wheezing, no crackles. Normal respiratory effort. No accessory muscle use.  Cardiovascular: Regular rate and rhythm, no murmurs / rubs / gallops. No extremity edema. 2+ pedal pulses. No carotid bruits.  Abdomen: severe tenderness on epigastric area, no rebound no guarding, no masses palpated. No hepatosplenomegaly. Bowel sounds positive.  Musculoskeletal: no clubbing / cyanosis. No joint deformity upper and lower extremities. Good  ROM, no contractures. Normal muscle tone.  Skin: no rashes, lesions, ulcers. No induration Neurologic: CN 2-12 grossly intact. Sensation intact, DTR normal. Strength 5/5 in all 4.  Psychiatric: Normal judgment and insight. Alert and oriented x 3. Normal mood.     Labs on Admission: I have personally reviewed following labs and imaging studies  CBC: Recent Labs  Lab 04/20/19 0923 04/20/19 1253  WBC 15.7*  --   HGB 17.5* 19.0*  HCT 55.8* 56.0*  MCV 80.6  --   PLT 302  --    Basic Metabolic  Panel: Recent Labs  Lab 04/20/19 0923 04/20/19 1253  NA 129* 124*  K 5.1 4.9  CL 93*  --   CO2 10*  --   GLUCOSE 870*  --   BUN 28*  --   CREATININE 1.77*  --   CALCIUM 9.0  --    GFR: CrCl cannot be calculated (Unknown ideal weight.). Liver Function Tests: Recent Labs  Lab 04/20/19 0923  AST 51*  ALT 45*  ALKPHOS 93  BILITOT 1.4*  PROT 7.9  ALBUMIN 4.0   Recent Labs  Lab 04/20/19 0923  LIPASE 1,319*   No results for input(s): AMMONIA in the last 168 hours. Coagulation Profile: No results for input(s): INR, PROTIME in the last 168 hours. Cardiac Enzymes: No results for input(s): CKTOTAL, CKMB, CKMBINDEX, TROPONINI in the last 168 hours. BNP (last 3 results) No results for input(s): PROBNP in the last 8760 hours. HbA1C: No results for input(s): HGBA1C in the last 72 hours. CBG: Recent Labs  Lab 04/20/19 1215  GLUCAP >600*   Lipid Profile: No results for input(s): CHOL, HDL, LDLCALC, TRIG, CHOLHDL, LDLDIRECT in the last 72 hours. Thyroid Function Tests: No results for input(s): TSH, T4TOTAL, FREET4, T3FREE, THYROIDAB in the last 72 hours. Anemia Panel: No results for input(s): VITAMINB12, FOLATE, FERRITIN, TIBC, IRON, RETICCTPCT in the last 72 hours. Urine analysis:    Component Value Date/Time   LABSPEC 1.025 09/12/2018 0949   PHURINE 5.5 09/12/2018 0949   GLUCOSEU 500 (A) 09/12/2018 0949   HGBUR NEGATIVE 09/12/2018 0949   BILIRUBINUR SMALL (A) 09/12/2018 0949   KETONESUR 15 (A) 09/12/2018 0949   PROTEINUR 30 (A) 09/12/2018 0949   UROBILINOGEN 0.2 09/12/2018 0949   NITRITE NEGATIVE 09/12/2018 0949   LEUKOCYTESUR NEGATIVE 09/12/2018 0949    Radiological Exams on Admission: CT ABDOMEN PELVIS WO CONTRAST  Addendum Date: 04/20/2019   ADDENDUM REPORT: 04/20/2019 13:40 ADDENDUM: The patient does have a midline skin lesion over the mid sternum measuring approximately 4.0 by 1.5 cm of unknown etiology. Electronically Signed   By: Francene Boyers M.D.   On:  04/20/2019 13:40   Result Date: 04/20/2019 CLINICAL DATA:  Upper abdominal pain with nausea and vomiting. Acute pancreatitis. EXAM: CT ABDOMEN AND PELVIS WITHOUT CONTRAST TECHNIQUE: Multidetector CT imaging of the abdomen and pelvis was performed following the standard protocol without IV contrast. COMPARISON:  None. FINDINGS: Lower chest: No acute abnormality. Tiny calcified granuloma in the right lower lobe. No effusions. Hepatobiliary: Severe hepatic steatosis. Cholecystectomy. No focal liver lesions. No biliary ductal dilatation. Pancreas: Peripancreatic soft tissue edema with edema extending into the mesentery with a small amount of ascites around the spleen and around the inferior aspect of the right lobe of the liver. Spleen: Normal in size without focal abnormality. Adrenals/Urinary Tract: Adrenal glands are unremarkable. Kidneys are normal, without renal calculi, focal lesion, or hydronephrosis. Bladder is unremarkable. Stomach/Bowel: Stomach is within normal limits. Appendix appears normal. No  evidence of bowel wall thickening, distention, or inflammatory changes. Vascular/Lymphatic: No significant vascular findings are present. No enlarged abdominal or pelvic lymph nodes. Reproductive: Prostate is unremarkable. Other: There is a small amount of ascites pericolic gutters and adjacent to the spleen and inferior aspect the liver as well as mesenteric edema, all felt pancreatitis. No abdominal wall hernias. Musculoskeletal: No acute or significant osseous findings. IMPRESSION: 1. Acute pancreatitis. 2. Severe hepatic steatosis. 3. Small amount of ascites. Electronically Signed: By: Lorriane Shire M.D. On: 04/20/2019 13:14   DG Chest 2 View  Result Date: 04/20/2019 CLINICAL DATA:  Shortness of breath and dizziness EXAM: CHEST - 2 VIEW COMPARISON:  None. FINDINGS: Lungs are clear. Heart is upper normal in size with pulmonary vascularity normal. No adenopathy. No bone lesions. IMPRESSION: No edema or  consolidation.  Heart upper normal in size. Electronically Signed   By: Lowella Grip III M.D.   On: 04/20/2019 10:03    EKG: Independently reviewed.   Assessment/Plan Active Problems:   Acute pancreatitis   DKA (diabetic ketoacidoses) (HCC)   Acute pancreatitis, BISAP score 2 (SIRS and elevated BUN), status post cystectomy and is nondrinker, will check triglyceride.  Discussed with GI PA, who will see the patient and give more recommendations.  N.p.o., fluid and pain meds Zofran.  SIRS, elevated WBC count, partly likely from hemoconcentration and patient's Hb lab also elevated. CT however shows no significant necrotizing lesions, x-ray showed no significant infiltrates will hold off antibiotics.  DKA, insulin drip for now, NPO.  HTN, hold off his home BP meds, until SIRS resolved.  No anion gap metabolic acidosis, from continuous nauseous vomiting diarrhea, fluids and supportive care.  AKI, likely from dehydration, will hold colchicine    DVT prophylaxis: Heparin SubQ Code Status: Full Family Communication: Mother over phone Disposition Plan: Home Consults called: [place] Admission status: PCU for insulin drip   Lequita Halt MD [facility] Pager [phone]  If 7PM-7AM, please contact night-coverage [email or web address] Password [email or web address]  04/20/2019, 2:20 PM

## 2019-04-20 NOTE — ED Notes (Signed)
Patient left before being called for registration, did not tell staff he was leaving, no answer after being called x3.

## 2019-04-20 NOTE — ED Provider Notes (Signed)
[facility] EMERGENCY DEPARTMENT Provider Note   CSN: [id number] Arrival date & time: 04/20/19  5284     History Chief Complaint  Patient presents with  . Abdominal Pain  . Emesis  . Shortness of Breath    Caleb Chambers is a 54 y.o. male.  HPI  54 year old male presents with abdominal pain and vomiting.  Started a couple days ago.  Pain is diffuse across his upper abdomen and severe.  He has not taken anything specifically for it.  He has had numerous episodes of vomiting and diarrhea as well.  No fevers or cough.  Denies any alcohol use.  Past Medical History:  Diagnosis Date  . Diabetes mellitus without complication (HCC)   . Gout     There are no problems to display for this patient.   History reviewed. No pertinent surgical history.     Family History  Problem Relation Age of Onset  . Healthy Mother   . Hypertension Father     Social History   Tobacco Use  . Smoking status: Never Smoker  . Smokeless tobacco: Never Used  Substance Use Topics  . Alcohol use: Not Currently  . Drug use: Not on file    Home Medications Prior to Admission medications   Medication Sig Start Date End Date Taking? Authorizing Provider  allopurinol (ZYLOPRIM) 100 MG tablet Take 100 mg by mouth daily.    [provider]  bumetanide (BUMEX) 0.5 MG tablet Take 0.5 mg by mouth daily.    [provider]  Clotrimazole 1 % OINT Apply 1 application topically 2 (two) times a day. 09/12/18   Georgetta Haber, NP  colchicine (COLCRYS) 0.6 MG tablet Take 1 tablet (0.6 mg total) by mouth 2 (two) times daily. Needs office visit (second notice) 10/30/11   Nelva Nay, PA-C  insulin glargine (LANTUS) 100 UNIT/ML injection Inject into the skin daily.    [provider]  lisinopril (ZESTRIL) 10 MG tablet Take 10 mg by mouth daily.    [provider]  metFORMIN (GLUCOPHAGE) 850 MG tablet Take 500 mg by mouth 2 (two) times daily with a meal.      [provider]  metFORMIN (GLUCOPHAGE-XR) 500 MG 24 hr tablet Take 500 mg by mouth daily with breakfast.    [provider]    Allergies    Penicillins  Review of Systems   Review of Systems  Constitutional: Negative for fever.  Respiratory: Negative for cough and shortness of breath.   Gastrointestinal: Positive for abdominal pain, diarrhea, nausea and vomiting.  All other systems reviewed and are negative.   Physical Exam Updated Vital Signs BP 131/87 (BP Location: Right Arm)   Pulse (!) 131   Temp 97.8 F (36.6 C) (Oral)   Resp (!) 22   SpO2 99%   Physical Exam Vitals and nursing note reviewed.  Constitutional:      General: He is in acute distress.     Appearance: He is well-developed. He is not diaphoretic.  HENT:     Head: Normocephalic and atraumatic.     Right Ear: External ear normal.     Left Ear: External ear normal.     Nose: Nose normal.  Eyes:     General:        Right eye: No discharge.        Left eye: No discharge.  Cardiovascular:     Rate and Rhythm: Regular rhythm. Tachycardia present.  Heart sounds: Normal heart sounds.  Pulmonary:     Effort: Pulmonary effort is normal.     Breath sounds: Normal breath sounds.  Abdominal:     Palpations: Abdomen is soft.     Tenderness: There is abdominal tenderness in the right upper quadrant, epigastric area and left upper quadrant.  Musculoskeletal:     Cervical back: Neck supple.  Skin:    General: Skin is warm and dry.  Neurological:     Mental Status: He is alert.  Psychiatric:        Mood and Affect: Mood is not anxious.     ED Results / Procedures / Treatments   Labs (all labs ordered are listed, but only abnormal results are displayed) Labs Reviewed  LIPASE, BLOOD - Abnormal; Notable for the following components:      Result Value   Lipase 1,319 (*)    All other components within normal limits  COMPREHENSIVE METABOLIC PANEL - Abnormal; Notable for the following  components:   Sodium 129 (*)    Chloride 93 (*)    CO2 10 (*)    Glucose, Bld 870 (*)    BUN 28 (*)    Creatinine, Ser 1.77 (*)    AST 51 (*)    ALT 45 (*)    Total Bilirubin 1.4 (*)    GFR calc non Af Amer 43 (*)    GFR calc Af Amer 50 (*)    Anion gap 26 (*)    All other components within normal limits  CBC - Abnormal; Notable for the following components:   WBC 15.7 (*)    RBC 6.92 (*)    Hemoglobin 17.5 (*)    HCT 55.8 (*)    MCH 25.3 (*)    All other components within normal limits  CULTURE, BLOOD (ROUTINE X 2)  CULTURE, BLOOD (ROUTINE X 2)  URINALYSIS, ROUTINE W REFLEX MICROSCOPIC  LACTIC ACID, PLASMA  LACTIC ACID, PLASMA  BETA-HYDROXYBUTYRIC ACID  I-STAT VENOUS BLOOD GAS, ED    EKG EKG Interpretation  Date/Time:  Monday April 20 2019 09:14:32 EST Ventricular Rate:  135 PR Interval:  144 QRS Duration: 88 QT Interval:  290 QTC Calculation: 435 R Axis:   -73 Text Interpretation: Sinus tachycardia Left anterior fascicular block Possible Lateral infarct , age undetermined Abnormal ECG No old tracing to compare Confirmed by Pricilla Loveless [phone]) on 04/20/2019 11:19:30 AM   Radiology DG Chest 2 View  Result Date: 04/20/2019 CLINICAL DATA:  Shortness of breath and dizziness EXAM: CHEST - 2 VIEW COMPARISON:  None. FINDINGS: Lungs are clear. Heart is upper normal in size with pulmonary vascularity normal. No adenopathy. No bone lesions. IMPRESSION: No edema or consolidation.  Heart upper normal in size. Electronically Signed   By: Bretta Bang III M.D.   On: 04/20/2019 10:03    Procedures .Critical Care Performed by: Pricilla Loveless, MD Authorized by: Pricilla Loveless, MD   Critical care provider statement:    Critical care time (minutes):  35   Critical care time was exclusive of:  Separately billable procedures and treating other patients   Critical care was necessary to treat or prevent imminent or life-threatening deterioration of the following  conditions:  Endocrine crisis   Critical care was time spent personally by me on the following activities:  Discussions with consultants, evaluation of patient's response to treatment, examination of patient, ordering and performing treatments and interventions, ordering and review of laboratory studies, ordering and review of radiographic studies, pulse  oximetry, re-evaluation of patient's condition, obtaining history from patient or surrogate and review of old charts   (including critical care time)  Medications Ordered in ED Medications  sodium chloride flush (NS) 0.9 % injection 3 mL (has no administration in time range)  sodium chloride 0.9 % bolus 2,000 mL (has no administration in time range)  ondansetron (ZOFRAN) injection 4 mg (has no administration in time range)  HYDROmorphone (DILAUDID) injection 1 mg (has no administration in time range)  lactated ringers bolus 1,000 mL (has no administration in time range)  lactated ringers bolus 1,000 mL (has no administration in time range)    ED Course  I have reviewed the triage vital signs and the nursing notes.  Pertinent labs & imaging results that were available during my care of the patient were reviewed by me and considered in my medical decision making (see chart for details).    MDM Rules/Calculators/A&P                      Patient appears to have acute pancreatitis, no clear cause.  This seems to be explaining his pain/vomiting.  CT otherwise unremarkable.  He is also in DKA though somewhat compensating.  He was given IV fluid boluses and started on insulin drip.  Potassium initially okay.  Admit to hospitalist service.  Caleb Chambers was evaluated in Emergency Department on 04/20/2019 for the symptoms described in the history of present illness. He was evaluated in the context of the global COVID-19 pandemic, which necessitated consideration that the patient might be at risk for infection with the SARS-CoV-2 virus that causes  COVID-19. Institutional protocols and algorithms that pertain to the evaluation of patients at risk for COVID-19 are in a state of rapid change based on information released by regulatory bodies including the CDC and federal and state organizations. These policies and algorithms were followed during the patient's care in the ED.  Final Clinical Impression(s) / ED Diagnoses Final diagnoses:  Acute pancreatitis without infection or necrosis, unspecified pancreatitis type  Diabetic ketoacidosis without coma associated with type 2 diabetes mellitus ([place])  Acute kidney injury [facility])    Rx / DC Orders ED Discharge Orders    None       Sherwood Gambler, MD 04/20/19 1645

## 2019-04-20 NOTE — ED Notes (Signed)
Glucose 870, notified Langston Masker, [place] for further orders.

## 2019-04-20 NOTE — Consult Note (Signed)
Reason for Consult: Acute pancreatitis Referring Physician: [facility]  [facility] [place] HPI: This is a 54 year old male who is well-known to me for a PMH of Crohn's disease on Humira and DM.  Two days ago he started to have some upper abdominal pain and the pain continued to worsen.  It was associated with severe nausea and vomiting.  He was brought to the hospital by his family, but he was initially unwilling to come to the hospital.  Upon arrival the patient was noted to be in severe abdominal pain, tachycardic, and tachypneic.  He was normotensive.  Over Christmas the patient states that he ate very poorly and his diabetes was under poor control.  Blood work in the ER showed an elevated in her lipase at 1,319, elevated creatinine at 1.77 (baseline around .7), HCT at 55.8, with an increase to 56.0, and glucose at 870.  He was diagnosed with an acute pancreatitis and DKA.  He received two liters of normal saline in the ER.  His CT scan shows an acute pancreatitis with hepatic steatosis and some mild ascites.  Past Medical History:  Diagnosis Date  . Diabetes mellitus without complication (HCC)   . Gout     History reviewed. No pertinent surgical history.  Family History  Problem Relation Age of Onset  . Healthy Mother   . Hypertension Father     Social History:  reports that he has never smoked. He has never used smokeless tobacco. He reports previous alcohol use. No history on file for drug.  Allergies:  Allergies  Allergen Reactions  . Penicillins Anaphylaxis and Hives    Medications:  Scheduled: . [START ON 04/21/2019] allopurinol  100 mg Oral Daily  . heparin  5,000 Units Subcutaneous Q8H  . HYDROmorphone   Intravenous Q4H   Continuous: . sodium chloride 125 mL/hr at 04/20/19 1600  . dextrose 5 % and 0.45% NaCl    . insulin 13 Units/hr (04/20/19 1610)  . lactated ringers    . lactated ringers      Results for orders placed or performed during the hospital encounter  of 04/20/19 (from the past 24 hour(s))  Lipase, blood     Status: Abnormal   Collection Time: 04/20/19  9:23 AM  Result Value Ref Range   Lipase 1,319 (H) 11 - 51 U/L  Comprehensive metabolic panel     Status: Abnormal   Collection Time: 04/20/19  9:23 AM  Result Value Ref Range   Sodium 129 (L) 135 - 145 mmol/L   Potassium 5.1 3.5 - 5.1 mmol/L   Chloride 93 (L) 98 - 111 mmol/L   CO2 10 (L) 22 - 32 mmol/L   Glucose, Bld 870 (HH) 70 - 99 mg/dL   BUN 28 (H) 6 - 20 mg/dL   Creatinine, Ser 1.61 (H) 0.61 - 1.24 mg/dL   Calcium 9.0 8.9 - 09.6 mg/dL   Total Protein 7.9 6.5 - 8.1 g/dL   Albumin 4.0 3.5 - 5.0 g/dL   AST 51 (H) 15 - 41 U/L   ALT 45 (H) 0 - 44 U/L   Alkaline Phosphatase 93 38 - 126 U/L   Total Bilirubin 1.4 (H) 0.3 - 1.2 mg/dL   GFR calc non Af Amer 43 (L) >60 mL/min   GFR calc Af Amer 50 (L) >60 mL/min   Anion gap 26 (H) 5 - 15  CBC     Status: Abnormal   Collection Time: 04/20/19  9:23 AM  Result Value  Ref Range   WBC 15.7 (H) 4.0 - 10.5 K/uL   RBC 6.92 (H) 4.22 - 5.81 MIL/uL   Hemoglobin 17.5 (H) 13.0 - 17.0 g/dL   HCT 50.5 (H) 39.7 - 67.3 %   MCV 80.6 80.0 - 100.0 fL   MCH 25.3 (L) 26.0 - 34.0 pg   MCHC 31.4 30.0 - 36.0 g/dL   RDW 41.9 37.9 - 02.4 %   Platelets 302 150 - 400 K/uL   nRBC 0.0 0.0 - 0.2 %  CBG monitoring, ED     Status: Abnormal   Collection Time: 04/20/19 12:15 PM  Result Value Ref Range   Glucose-Capillary >600 (HH) 70 - 99 mg/dL   Comment 1 Notify RN    Comment 2 Document in Chart   Beta-hydroxybutyric acid     Status: Abnormal   Collection Time: 04/20/19 12:38 PM  Result Value Ref Range   Beta-Hydroxybutyric Acid 4.50 (H) 0.05 - 0.27 mmol/L  POCT I-Stat EG7     Status: Abnormal   Collection Time: 04/20/19 12:53 PM  Result Value Ref Range   pH, Ven 7.309 7.250 - 7.430   pCO2, Ven 19.5 (LL) 44.0 - 60.0 mmHg   pO2, Ven 104.0 (H) 32.0 - 45.0 mmHg   Bicarbonate 9.8 (L) 20.0 - 28.0 mmol/L   TCO2 10 (L) 22 - 32 mmol/L   O2 Saturation 98.0 %    Acid-base deficit 13.0 (H) 0.0 - 2.0 mmol/L   Sodium 124 (L) 135 - 145 mmol/L   Potassium 4.9 3.5 - 5.1 mmol/L   Calcium, Ion 0.84 (LL) 1.15 - 1.40 mmol/L   HCT 56.0 (H) 39.0 - 52.0 %   Hemoglobin 19.0 (H) 13.0 - 17.0 g/dL   Patient temperature HIDE    Sample type VENOUS    Comment NOTIFIED PHYSICIAN   CBG monitoring, ED     Status: Abnormal   Collection Time: 04/20/19  2:43 PM  Result Value Ref Range   Glucose-Capillary >600 (HH) 70 - 99 mg/dL  CBG monitoring, ED     Status: Abnormal   Collection Time: 04/20/19  3:35 PM  Result Value Ref Range   Glucose-Capillary >600 (HH) 70 - 99 mg/dL   Comment 1 Document in Chart      CT ABDOMEN PELVIS WO CONTRAST  Addendum Date: 04/20/2019   ADDENDUM REPORT: 04/20/2019 13:40 ADDENDUM: The patient does have a midline skin lesion over the mid sternum measuring approximately 4.0 by 1.5 cm of unknown etiology. Electronically Signed   By: Francene Boyers M.D.   On: 04/20/2019 13:40   Result Date: 04/20/2019 CLINICAL DATA:  Upper abdominal pain with nausea and vomiting. Acute pancreatitis. EXAM: CT ABDOMEN AND PELVIS WITHOUT CONTRAST TECHNIQUE: Multidetector CT imaging of the abdomen and pelvis was performed following the standard protocol without IV contrast. COMPARISON:  None. FINDINGS: Lower chest: No acute abnormality. Tiny calcified granuloma in the right lower lobe. No effusions. Hepatobiliary: Severe hepatic steatosis. Cholecystectomy. No focal liver lesions. No biliary ductal dilatation. Pancreas: Peripancreatic soft tissue edema with edema extending into the mesentery with a small amount of ascites around the spleen and around the inferior aspect of the right lobe of the liver. Spleen: Normal in size without focal abnormality. Adrenals/Urinary Tract: Adrenal glands are unremarkable. Kidneys are normal, without renal calculi, focal lesion, or hydronephrosis. Bladder is unremarkable. Stomach/Bowel: Stomach is within normal limits. Appendix appears  normal. No evidence of bowel wall thickening, distention, or inflammatory changes. Vascular/Lymphatic: No significant vascular findings are present. No enlarged  abdominal or pelvic lymph nodes. Reproductive: Prostate is unremarkable. Other: There is a small amount of ascites pericolic gutters and adjacent to the spleen and inferior aspect the liver as well as mesenteric edema, all felt pancreatitis. No abdominal wall hernias. Musculoskeletal: No acute or significant osseous findings. IMPRESSION: 1. Acute pancreatitis. 2. Severe hepatic steatosis. 3. Small amount of ascites. Electronically Signed: By: Lorriane Shire M.D. On: 04/20/2019 13:14   DG Chest 2 View  Result Date: 04/20/2019 CLINICAL DATA:  Shortness of breath and dizziness EXAM: CHEST - 2 VIEW COMPARISON:  None. FINDINGS: Lungs are clear. Heart is upper normal in size with pulmonary vascularity normal. No adenopathy. No bone lesions. IMPRESSION: No edema or consolidation.  Heart upper normal in size. Electronically Signed   By: Lowella Grip III M.D.   On: 04/20/2019 10:03    ROS:  As stated above in the HPI otherwise negative.  Blood pressure 126/80, pulse (!) 120, temperature 97.8 F (36.6 C), temperature source Oral, resp. rate (!) 34, SpO2 93 %.    PE: Gen: Alert and Oriented, very uncomfortable, writhing at times in pain HEENT:  [place]/AT, EOMI Neck: Supple, no LAD Lungs: CTA Bilaterally CV: RRR without M/G/R ABM: Soft, tender in the upper abdomen, +BS Ext: No C/C/E  Assessment/Plan: 1) Acute pancreatitis. 2) ABM pain. 3) DKA.   The patient has a severe pancreatitis.  The etiology may be his uncontrolled DM, hypertriglyceridemia, or medications.  He is well-known to me and he does not use any illicit substances nor does he drink ETOH.  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  The patient is very uncomfortable and he will need to be treated aggressively with more IV hydration.  His HCT  increased up to 56, but it is unclear when he received the IV hydration in relation to the blood draw.  He will receive another liter of fluids, but he will receive LR this time.  The baseline IV hydration will be 300 ml/kg/hour at 3 ml/kg/hour.  Another liter of LR may need to be bolused depending on his blood work and response.  Part of the hemoconcentration is as a result of the DKA.  His baseline HGB is typically around 11 g/dL (HCT of 33).    Plan: 1) Aggressive IV hydration as outlined. 2) Dilaudid PCA. 3) NPO. 4) DKA management per Hospitalist. Latif Nazareno D 04/20/2019, 4:43 PM

## 2019-04-20 NOTE — ED Notes (Signed)
Glucose   693

## 2019-04-20 NOTE — ED Notes (Signed)
Got patient on there monitor patient is resting with call bell in reach  

## 2019-04-21 ENCOUNTER — Encounter (HOSPITAL_COMMUNITY): Payer: Self-pay | Admitting: Internal Medicine

## 2019-04-21 DIAGNOSIS — I10 Essential (primary) hypertension: Secondary | ICD-10-CM

## 2019-04-21 DIAGNOSIS — E871 Hypo-osmolality and hyponatremia: Secondary | ICD-10-CM

## 2019-04-21 LAB — CBG MONITORING, ED
Glucose-Capillary: 135 mg/dL — ABNORMAL HIGH (ref 70–99)
Glucose-Capillary: 152 mg/dL — ABNORMAL HIGH (ref 70–99)
Glucose-Capillary: 170 mg/dL — ABNORMAL HIGH (ref 70–99)
Glucose-Capillary: 174 mg/dL — ABNORMAL HIGH (ref 70–99)
Glucose-Capillary: 175 mg/dL — ABNORMAL HIGH (ref 70–99)
Glucose-Capillary: 194 mg/dL — ABNORMAL HIGH (ref 70–99)
Glucose-Capillary: 205 mg/dL — ABNORMAL HIGH (ref 70–99)
Glucose-Capillary: 208 mg/dL — ABNORMAL HIGH (ref 70–99)
Glucose-Capillary: 209 mg/dL — ABNORMAL HIGH (ref 70–99)
Glucose-Capillary: 216 mg/dL — ABNORMAL HIGH (ref 70–99)
Glucose-Capillary: 241 mg/dL — ABNORMAL HIGH (ref 70–99)
Glucose-Capillary: 246 mg/dL — ABNORMAL HIGH (ref 70–99)

## 2019-04-21 LAB — COMPREHENSIVE METABOLIC PANEL
ALT: 40 U/L (ref 0–44)
AST: 89 U/L — ABNORMAL HIGH (ref 15–41)
Albumin: 2.9 g/dL — ABNORMAL LOW (ref 3.5–5.0)
Alkaline Phosphatase: 61 U/L (ref 38–126)
Anion gap: 12 (ref 5–15)
BUN: 28 mg/dL — ABNORMAL HIGH (ref 6–20)
CO2: 18 mmol/L — ABNORMAL LOW (ref 22–32)
Calcium: 7.6 mg/dL — ABNORMAL LOW (ref 8.9–10.3)
Chloride: 106 mmol/L (ref 98–111)
Creatinine, Ser: 1.07 mg/dL (ref 0.61–1.24)
GFR calc Af Amer: >60 mL/min (ref >60)
GFR calc non Af Amer: >60 mL/min (ref >60)
Glucose, Bld: 198 mg/dL — ABNORMAL HIGH (ref 70–99)
Potassium: 4.8 mmol/L (ref 3.5–5.1)
Sodium: 136 mmol/L (ref 135–145)
Total Bilirubin: 1.3 mg/dL — ABNORMAL HIGH (ref 0.3–1.2)
Total Protein: 6.3 g/dL — ABNORMAL LOW (ref 6.5–8.1)

## 2019-04-21 LAB — CBC
HCT: 51 % (ref 39.0–52.0)
HCT: 49.5 % (ref 39.0–52.0)
HCT: 46.2 % (ref 39.0–52.0)
Hemoglobin: 16.6 g/dL (ref 13.0–17.0)
Hemoglobin: 15.7 g/dL (ref 13.0–17.0)
Hemoglobin: 15.1 g/dL (ref 13.0–17.0)
MCH: 25.5 pg — ABNORMAL LOW (ref 26.0–34.0)
MCH: 25.8 pg — ABNORMAL LOW (ref 26.0–34.0)
MCH: 25.3 pg — ABNORMAL LOW (ref 26.0–34.0)
MCHC: 32.5 g/dL (ref 30.0–36.0)
MCHC: 31.7 g/dL (ref 30.0–36.0)
MCHC: 32.7 g/dL (ref 30.0–36.0)
MCV: 78.2 fL — ABNORMAL LOW (ref 80.0–100.0)
MCV: 81.4 fL (ref 80.0–100.0)
MCV: 77.5 fL — ABNORMAL LOW (ref 80.0–100.0)
Platelets: 181 10*3/uL (ref 150–400)
Platelets: 175 10*3/uL (ref 150–400)
Platelets: 191 10*3/uL (ref 150–400)
RBC: 6.52 MIL/uL — ABNORMAL HIGH (ref 4.22–5.81)
RBC: 6.08 MIL/uL — ABNORMAL HIGH (ref 4.22–5.81)
RBC: 5.96 MIL/uL — ABNORMAL HIGH (ref 4.22–5.81)
RDW: 14.6 % (ref 11.5–15.5)
RDW: 14.5 % (ref 11.5–15.5)
RDW: 14.2 % (ref 11.5–15.5)
WBC: 14 10*3/uL — ABNORMAL HIGH (ref 4.0–10.5)
WBC: 12.6 10*3/uL — ABNORMAL HIGH (ref 4.0–10.5)
WBC: 11.1 10*3/uL — ABNORMAL HIGH (ref 4.0–10.5)
nRBC: 0 % (ref 0.0–0.2)
nRBC: 0 % (ref 0.0–0.2)
nRBC: 0 % (ref 0.0–0.2)

## 2019-04-21 LAB — LIPASE, BLOOD
Lipase: 684 U/L — ABNORMAL HIGH (ref 11–51)
Lipase: 554 U/L — ABNORMAL HIGH (ref 11–51)

## 2019-04-21 LAB — BASIC METABOLIC PANEL
Anion gap: 12 (ref 5–15)
BUN: 34 mg/dL — ABNORMAL HIGH (ref 6–20)
CO2: 20 mmol/L — ABNORMAL LOW (ref 22–32)
Calcium: 8 mg/dL — ABNORMAL LOW (ref 8.9–10.3)
Chloride: 103 mmol/L (ref 98–111)
Creatinine, Ser: 1.28 mg/dL — ABNORMAL HIGH (ref 0.61–1.24)
GFR calc Af Amer: >60 mL/min (ref >60)
GFR calc non Af Amer: >60 mL/min (ref >60)
Glucose, Bld: 219 mg/dL — ABNORMAL HIGH (ref 70–99)
Potassium: 4.2 mmol/L (ref 3.5–5.1)
Sodium: 135 mmol/L (ref 135–145)

## 2019-04-21 LAB — GLUCOSE, CAPILLARY
Glucose-Capillary: 149 mg/dL — ABNORMAL HIGH (ref 70–99)
Glucose-Capillary: 150 mg/dL — ABNORMAL HIGH (ref 70–99)
Glucose-Capillary: 157 mg/dL — ABNORMAL HIGH (ref 70–99)
Glucose-Capillary: 170 mg/dL — ABNORMAL HIGH (ref 70–99)
Glucose-Capillary: 193 mg/dL — ABNORMAL HIGH (ref 70–99)
Glucose-Capillary: 212 mg/dL — ABNORMAL HIGH (ref 70–99)
Glucose-Capillary: 252 mg/dL — ABNORMAL HIGH (ref 70–99)

## 2019-04-21 LAB — BETA-HYDROXYBUTYRIC ACID: Beta-Hydroxybutyric Acid: 0.16 mmol/L (ref 0.05–0.27)

## 2019-04-21 LAB — HEMOGLOBIN A1C
Hgb A1c MFr Bld: 13.1 % — ABNORMAL HIGH (ref 4.8–5.6)
Mean Plasma Glucose: 329 mg/dL

## 2019-04-21 LAB — LACTIC ACID, PLASMA
Lactic Acid, Venous: 3 mmol/L (ref 0.5–1.9)
Lactic Acid, Venous: 3.2 mmol/L (ref 0.5–1.9)

## 2019-04-21 NOTE — Plan of Care (Signed)
Monitoring patient closely.  Plan today is that patient will have better controlled pain.   Blood sugar levels will be managed.

## 2019-04-21 NOTE — Progress Notes (Signed)
Subjective: Still in pain.  Objective: Vital signs in last 24 hours: Temp:  [97.8 F (36.6 C)] 97.8 F (36.6 C) (01/04 0844) Pulse Rate:  [107-131] 107 (01/05 0515) Resp:  [21-34] 26 (01/05 0515) BP: (117-145)/(57-98) 144/98 (01/05 0515) SpO2:  [89 %-99 %] 91 % (01/05 0515)    Intake/Output from previous day: 01/04 0701 - 01/05 0700 In: 1000 [IV Piggyback:1000] Out: -  Intake/Output this shift: No intake/output data recorded.  General appearance: alert and moderate distress Resp: clear to auscultation bilaterally Cardio: regular rate and rhythm GI: tender in the upper abdomen Extremities: extremities normal, atraumatic, no cyanosis or edema  Lab Results: Recent Labs    04/20/19 0923 04/20/19 1253 04/21/19 0126 04/21/19 0630  WBC 15.7*  --  14.0* 12.6*  HGB 17.5* 19.0* 16.6 15.7  HCT 55.8* 56.0* 51.0 49.5  PLT 302  --  181 175   BMET Recent Labs    04/20/19 0923 04/20/19 1253 04/20/19 1747 04/21/19 0058  NA 129* 124* 130* 135  K 5.1 4.9 5.9* 4.2  CL 93*  --  100 103  CO2 10*  --  9* 20*  GLUCOSE 870*  --  693* 219*  BUN 28*  --  30* 34*  CREATININE 1.77*  --  1.81* 1.28*  CALCIUM 9.0  --  7.2* 8.0*   LFT Recent Labs    04/20/19 0923  PROT 7.9  ALBUMIN 4.0  AST 51*  ALT 45*  ALKPHOS 93  BILITOT 1.4*   PT/INR No results for input(s): LABPROT, INR in the last 72 hours. Hepatitis Panel No results for input(s): HEPBSAG, HCVAB, HEPAIGM, HEPBIGM in the last 72 hours. C-Diff No results for input(s): CDIFFTOX in the last 72 hours. Fecal Lactopherrin No results for input(s): FECLLACTOFRN in the last 72 hours.  Studies/Results: CT ABDOMEN PELVIS WO CONTRAST  Addendum Date: 04/20/2019   ADDENDUM REPORT: 04/20/2019 13:40 ADDENDUM: The patient does have a midline skin lesion over the mid sternum measuring approximately 4.0 by 1.5 cm of unknown etiology. Electronically Signed   By: Lorriane Shire M.D.   On: 04/20/2019 13:40   Result Date:  04/20/2019 CLINICAL DATA:  Upper abdominal pain with nausea and vomiting. Acute pancreatitis. EXAM: CT ABDOMEN AND PELVIS WITHOUT CONTRAST TECHNIQUE: Multidetector CT imaging of the abdomen and pelvis was performed following the standard protocol without IV contrast. COMPARISON:  None. FINDINGS: Lower chest: No acute abnormality. Tiny calcified granuloma in the right lower lobe. No effusions. Hepatobiliary: Severe hepatic steatosis. Cholecystectomy. No focal liver lesions. No biliary ductal dilatation. Pancreas: Peripancreatic soft tissue edema with edema extending into the mesentery with a small amount of ascites around the spleen and around the inferior aspect of the right lobe of the liver. Spleen: Normal in size without focal abnormality. Adrenals/Urinary Tract: Adrenal glands are unremarkable. Kidneys are normal, without renal calculi, focal lesion, or hydronephrosis. Bladder is unremarkable. Stomach/Bowel: Stomach is within normal limits. Appendix appears normal. No evidence of bowel wall thickening, distention, or inflammatory changes. Vascular/Lymphatic: No significant vascular findings are present. No enlarged abdominal or pelvic lymph nodes. Reproductive: Prostate is unremarkable. Other: There is a small amount of ascites pericolic gutters and adjacent to the spleen and inferior aspect the liver as well as mesenteric edema, all felt pancreatitis. No abdominal wall hernias. Musculoskeletal: No acute or significant osseous findings. IMPRESSION: 1. Acute pancreatitis. 2. Severe hepatic steatosis. 3. Small amount of ascites. Electronically Signed: By: Lorriane Shire M.D. On: 04/20/2019 13:14   DG Chest 2 View  Result Date: 04/20/2019 CLINICAL DATA:  Shortness of breath and dizziness EXAM: CHEST - 2 VIEW COMPARISON:  None. FINDINGS: Lungs are clear. Heart is upper normal in size with pulmonary vascularity normal. No adenopathy. No bone lesions. IMPRESSION: No edema or consolidation.  Heart upper normal in  size. Electronically Signed   By: Bretta Bang III M.D.   On: 04/20/2019 10:03    Medications:  Scheduled: . allopurinol  100 mg Oral Daily  . heparin  5,000 Units Subcutaneous Q8H  . HYDROmorphone   Intravenous Q4H   Continuous: . sodium chloride 125 mL/hr at 04/20/19 1600  . dextrose 5 % and 0.45% NaCl 75 mL/hr at 04/21/19 0028  . insulin 8.5 Units/hr (04/21/19 3557)  . lactated ringers 300 mL/hr at 04/20/19 2104    Assessment/Plan: 1) Acute pancreatitis. 2) DKA.   The patient is responding to the aggressive fluid resuscitation.  His HCT and creatinine are decreasing and his DKA is responding.  For reasons that are unclear he was not able to obtain his PCA and he is still in severe pain.  Plan: 1) Continue with aggressive fluid hydration at 300 ml/hour with LR. 2) Monitor HCT as a guide for adjusting his hydration. 3) I spoke with nursing about obtaining a PCA for him.   LOS: 1 day   Wilmer Santillo D 04/21/2019, 7:04 AM

## 2019-04-21 NOTE — ED Notes (Signed)
hospitalist at bedside

## 2019-04-21 NOTE — ED Notes (Signed)
Per Provider, Continue pt on Endotool until CBG Goal of 140-180 is met.

## 2019-04-21 NOTE — ED Notes (Signed)
Paged Provider regarding pts fluid intake

## 2019-04-21 NOTE — ED Notes (Signed)
PCA documentation  2.3mg  given 8 demands 6 delivered

## 2019-04-21 NOTE — Significant Event (Addendum)
Rapid Response Event Note  I assisted the ED RN in setting up the Dilaudid PCA Pump. Pump started, patient education done, continuous EtCO2/RR and SpO2 also started per protocol.    Francoise Chojnowski R

## 2019-04-21 NOTE — ED Notes (Signed)
Re-paged Provider regarding pts blood specimens

## 2019-04-21 NOTE — Progress Notes (Addendum)
PROGRESS NOTE    Caleb Chambers  WUX:[id number] DOB: [birth year] DOA: 04/20/2019 PCP: Patient, No Pcp Per   Brief Narrative:  HPI On 04/20/2019 by Dr. Wynetta Fines Caleb Chambers is a 54 y.o. male with medical history significant of IDDM, HTN, abdominal pain and vomiting. Started 2-3 days ago after heavy meal. Pain is diffuse across his upper abdomen and severe, cramping like. He has not taken anything specifically for it. He has had 20-30 episodes of vomiting and loose diarrhea as well. No fevers or cough. He stopped taking all his meds, including insulin and the Metformin since yesterday. Assessment & Plan   Acute pancreatitis/mildly elevated LFTs with abdominal pain, nausea, vomiting -Lipase 1319 on admission, however improved down to 684 -Patient was placed on IV fluids, currently n.p.o. -Has had a history of cholecystectomy -Gastroenterology consulted and appreciated.  Dr. Geoffry Paradise has recommended a PCA pump for his pain along with continuous aggressive IV fluid hydration -Continue antiemetics   DKA, anion gap metabolic acidosis -Suspect secondary to the above process -Presented with with anion gap of 26, glucose of 870 -Gap has since improved, has close -Patient placed on insulin drip, IV fluids -Bicarb improving slowly, up to 18 -Will obtain hemoglobin A1c -metformin and lantus held  Sepsis  -present on admission -unknown source, possibly secondary to the above -Leukocytosis improving, lactic acidosis also improving -Patient continues to be tachycardic and tachypneic which may be secondary to pain  Essential hypertension -BP currently stable -Lisinopril held  Acute kidney injury -Creatinine on admission was 1.77, slight worsening to 1.81 -However this morning is improved to 1.07 -Continue to monitor BMP  Hyponatremia -Sodium is 129 on admission, suspect secondary to DKA -Has since resolved, and improved to 135  DVT Prophylaxis Heparin  Code Status: Full  Family  Communication: None at bedside  Disposition Plan: Admitted.  Pending improvement in abdominal pain/pancreatitis and DKA.  Suspect home when stable  Consultants Gastroenterology  Procedures  None  Antibiotics   Anti-infectives (From admission, onward)   None      Subjective:   Caleb Chambers seen and examined today.  Continues to complain of abdominal pain, denies any further nausea or vomiting.  States he is now unable to eat and days.  Denies current chest pain or shortness of breath, dizziness or headache.   Objective:   Vitals:   04/21/19 0730 04/21/19 0815 04/21/19 0900 04/21/19 1014  BP: (!) 139/102 135/85 136/86   Pulse: (!) 113 (!) 108 (!) 109   Resp: (!) 23 (!) 23 (!) 25 (!) 28  Temp:      TempSrc:      SpO2: 93% 92% 95% 94%    Intake/Output Summary (Last 24 hours) at 04/21/2019 1041 Last data filed at 04/20/2019 1859 Gross per 24 hour  Intake 1000 ml  Output --  Net 1000 ml   There were no vitals filed for this visit.  Exam  General: Well developed, well nourished, NAD, appears stated age  72: NCAT, mucous membranes moist.   Cardiovascular: S1 S2 auscultated, tachycardic  Respiratory: Tachypneic however clear to auscultation  Abdomen: Soft, obese, generalized TTP, nondistended, positive bowel sounds  Extremities: warm dry without cyanosis clubbing or edema  Neuro: AAOx3, nonfocal  Psych: Appropriate mood and affect   Data Reviewed: I have personally reviewed following labs and imaging studies  CBC: Recent Labs  Lab 04/20/19 0923 04/20/19 1253 04/21/19 0126 04/21/19 0630  WBC 15.7*  --  14.0* 12.6*  HGB 17.5* 19.0* 16.6  15.7  HCT 55.8* 56.0* 51.0 49.5  MCV 80.6  --  78.2* 81.4  PLT 302  --  181 175   Basic Metabolic Panel: Recent Labs  Lab 04/20/19 0923 04/20/19 1253 04/20/19 1708 04/20/19 1747 04/21/19 0058 04/21/19 0630  NA 129* 124*  --  130* 135 136  K 5.1 4.9  --  5.9* 4.2 4.8  CL 93*  --   --  100 103 106  CO2 10*  --    --  9* 20* 18*  GLUCOSE 870*  --   --  693* 219* 198*  BUN 28*  --   --  30* 34* 28*  CREATININE 1.77*  --   --  1.81* 1.28* 1.07  CALCIUM 9.0  --   --  7.2* 8.0* 7.6*  MG  --   --  2.2  --   --   --   PHOS  --   --  3.5  --   --   --    GFR: CrCl cannot be calculated (Unknown ideal weight.). Liver Function Tests: Recent Labs  Lab 04/20/19 0923 04/21/19 0630  AST 51* 89*  ALT 45* 40  ALKPHOS 93 61  BILITOT 1.4* 1.3*  PROT 7.9 6.3*  ALBUMIN 4.0 2.9*   Recent Labs  Lab 04/20/19 0923 04/21/19 0630  LIPASE 1,319* 684*   No results for input(s): AMMONIA in the last 168 hours. Coagulation Profile: No results for input(s): INR, PROTIME in the last 168 hours. Cardiac Enzymes: No results for input(s): CKTOTAL, CKMB, CKMBINDEX, TROPONINI in the last 168 hours. BNP (last 3 results) No results for input(s): PROBNP in the last 8760 hours. HbA1C: No results for input(s): HGBA1C in the last 72 hours. CBG: Recent Labs  Lab 04/21/19 0452 04/21/19 0631 04/21/19 0730 04/21/19 0758 04/21/19 0842  GLUCAP 241* 194* 175* 174* 170*   Lipid Profile: Recent Labs    04/20/19 1708  CHOL 98  HDL 39*  LDLCALC 45  TRIG 70  CHOLHDL 2.5   Thyroid Function Tests: No results for input(s): TSH, T4TOTAL, FREET4, T3FREE, THYROIDAB in the last 72 hours. Anemia Panel: No results for input(s): VITAMINB12, FOLATE, FERRITIN, TIBC, IRON, RETICCTPCT in the last 72 hours. Urine analysis:    Component Value Date/Time   COLORURINE YELLOW 04/20/2019 1708   APPEARANCEUR CLEAR 04/20/2019 1708   LABSPEC 1.022 04/20/2019 1708   PHURINE 5.0 04/20/2019 1708   GLUCOSEU >=500 (A) 04/20/2019 1708   HGBUR SMALL (A) 04/20/2019 1708   BILIRUBINUR NEGATIVE 04/20/2019 1708   KETONESUR 5 (A) 04/20/2019 1708   PROTEINUR NEGATIVE 04/20/2019 1708   UROBILINOGEN 0.2 09/12/2018 0949   NITRITE NEGATIVE 04/20/2019 1708   LEUKOCYTESUR NEGATIVE 04/20/2019 1708   Sepsis  Labs: @LABRCNTIP (procalcitonin:4,lacticidven:4)  ) Recent Results (from the past 240 hour(s))  Respiratory Panel by RT PCR (Flu A&B, Covid) - Nasopharyngeal Swab     Status: None   Collection Time: 04/20/19  3:53 PM   Specimen: Nasopharyngeal Swab  Result Value Ref Range Status   SARS Coronavirus 2 by RT PCR NEGATIVE NEGATIVE Final    Comment: (NOTE) SARS-CoV-2 target nucleic acids are NOT DETECTED. The SARS-CoV-2 RNA is generally detectable in upper respiratoy specimens during the acute phase of infection. The lowest concentration of SARS-CoV-2 viral copies this assay can detect is 131 copies/mL. A negative result does not preclude SARS-Cov-2 infection and should not be used as the sole basis for treatment or other patient management decisions. A negative result may occur with  improper specimen collection/handling, submission of specimen other than nasopharyngeal swab, presence of viral mutation(s) within the areas targeted by this assay, and inadequate number of viral copies (<131 copies/mL). A negative result must be combined with clinical observations, patient history, and epidemiological information. The expected result is Negative. Fact Sheet for Patients:  https://www.moore.com/ Fact Sheet for Healthcare Providers:  https://www.young.biz/ This test is not yet ap proved or cleared by the [place] FDA and  has been authorized for detection and/or diagnosis of SARS-CoV-2 by FDA under an Emergency Use Authorization (EUA). This EUA will remain  in effect (meaning this test can be used) for the duration of the COVID-19 declaration under Section 564(b)(1) of the Act, 21 U.S.C. section 360bbb-3(b)(1), unless the authorization is terminated or revoked sooner.    Influenza A by PCR NEGATIVE NEGATIVE Final   Influenza B by PCR NEGATIVE NEGATIVE Final    Comment: (NOTE) The Xpert Xpress SARS-CoV-2/FLU/RSV assay is intended as an aid in   the diagnosis of influenza from Nasopharyngeal swab specimens and  should not be used as a sole basis for treatment. Nasal washings and  aspirates are unacceptable for Xpert Xpress SARS-CoV-2/FLU/RSV  testing. Fact Sheet for Patients: https://www.moore.com/ Fact Sheet for Healthcare Providers: https://www.young.biz/ This test is not yet approved or cleared by the [place] FDA and  has been authorized for detection and/or diagnosis of SARS-CoV-2 by  FDA under an Emergency Use Authorization (EUA). This EUA will remain  in effect (meaning this test can be used) for the duration of the  Covid-19 declaration under Section 564(b)(1) of the Act, 21  U.S.C. section 360bbb-3(b)(1), unless the authorization is  terminated or revoked. Performed at [facility], [address] [place], [place] [address]       Radiology Studies: CT ABDOMEN PELVIS WO CONTRAST  Addendum Date: 04/20/2019   ADDENDUM REPORT: 04/20/2019 13:40 ADDENDUM: The patient does have a midline skin lesion over the mid sternum measuring approximately 4.0 by 1.5 cm of unknown etiology. Electronically Signed   By: Francene Boyers M.D.   On: 04/20/2019 13:40   Result Date: 04/20/2019 CLINICAL DATA:  Upper abdominal pain with nausea and vomiting. Acute pancreatitis. EXAM: CT ABDOMEN AND PELVIS WITHOUT CONTRAST TECHNIQUE: Multidetector CT imaging of the abdomen and pelvis was performed following the standard protocol without IV contrast. COMPARISON:  None. FINDINGS: Lower chest: No acute abnormality. Tiny calcified granuloma in the right lower lobe. No effusions. Hepatobiliary: Severe hepatic steatosis. Cholecystectomy. No focal liver lesions. No biliary ductal dilatation. Pancreas: Peripancreatic soft tissue edema with edema extending into the mesentery with a small amount of ascites around the spleen and around the inferior aspect of the right lobe of the liver. Spleen: Normal in size  without focal abnormality. Adrenals/Urinary Tract: Adrenal glands are unremarkable. Kidneys are normal, without renal calculi, focal lesion, or hydronephrosis. Bladder is unremarkable. Stomach/Bowel: Stomach is within normal limits. Appendix appears normal. No evidence of bowel wall thickening, distention, or inflammatory changes. Vascular/Lymphatic: No significant vascular findings are present. No enlarged abdominal or pelvic lymph nodes. Reproductive: Prostate is unremarkable. Other: There is a small amount of ascites pericolic gutters and adjacent to the spleen and inferior aspect the liver as well as mesenteric edema, all felt pancreatitis. No abdominal wall hernias. Musculoskeletal: No acute or significant osseous findings. IMPRESSION: 1. Acute pancreatitis. 2. Severe hepatic steatosis. 3. Small amount of ascites. Electronically Signed: By: Francene Boyers M.D. On: 04/20/2019 13:14   DG Chest 2 View  Result Date: 04/20/2019  CLINICAL DATA:  Shortness of breath and dizziness EXAM: CHEST - 2 VIEW COMPARISON:  None. FINDINGS: Lungs are clear. Heart is upper normal in size with pulmonary vascularity normal. No adenopathy. No bone lesions. IMPRESSION: No edema or consolidation.  Heart upper normal in size. Electronically Signed   By: Bretta Bang III M.D.   On: 04/20/2019 10:03     Scheduled Meds: . allopurinol  100 mg Oral Daily  . heparin  5,000 Units Subcutaneous Q8H  . HYDROmorphone   Intravenous Q4H   Continuous Infusions: . sodium chloride 125 mL/hr at 04/20/19 1600  . dextrose 5 % and 0.45% NaCl 75 mL/hr at 04/21/19 0028  . insulin 7.5 Units/hr (04/21/19 0740)  . lactated ringers 300 mL/hr at 04/21/19 0930     LOS: 1 day   Time Spent in minutes   45 minutes  Kylon Philbrook D.O. on 04/21/2019 at 10:41 AM  Between 7am to 7pm - Please see pager noted on [email or web address]  After 7pm go to [email or web address]  And look for the night coverage person covering for me after hours  [facility] Office  [phone]

## 2019-04-21 NOTE — ED Notes (Signed)
Paged Provider regarding pts new Blood specimen results; awaiting further orders.

## 2019-04-21 NOTE — Progress Notes (Signed)
Inpatient Diabetes Program Recommendations  AACE/ADA: New Consensus Statement on Inpatient Glycemic Control (2015)  Target Ranges:  Prepandial:   less than 140 mg/dL      Peak postprandial:   less than 180 mg/dL (1-2 hours)      Critically ill patients:  140 - 180 mg/dL   Lab Results  Component Value Date   GLUCAP 216 (H) 04/21/2019    Review of Glycemic Control Results for MALEAK, BRAZZEL (MRN [id number]) as of 04/21/2019 11:23  Ref. Range 04/21/2019 06:31 04/21/2019 07:30 04/21/2019 07:58 04/21/2019 08:42 04/21/2019 11:03  Glucose-Capillary Latest Ref Range: 70 - 99 mg/dL 301 (H) 601 (H) 093 (H) 170 (H) 216 (H)  Results for RAYCE, BRAHMBHATT (MRN [id number]) as of 04/21/2019 11:23  Ref. Range 04/21/2019 06:30  Beta-Hydroxybutyric Acid Latest Ref Range: 0.05 - 0.27 mmol/L 0.16   Diabetes history: DM 2 Outpatient Diabetes medications:  Basaglar 18 units daily (per chart review from 12/02/18 MD visit) Metformin 500 mg bid Current orders for Inpatient glycemic control:  IV insulin/EndoTool  Inpatient Diabetes Program Recommendations:    Note patient admitted with pancreatitis.  Blood sugars improved however insulin drip rates variable.  Patient is currently NPO.  Most recent beta-hydroxybutyric acid =0.16.    If patient is to be transitioned off insulin drip, may consider weight based option.  Will ask RN to weigh patient when possible.  Will follow.    Thanks  Beryl Meager, RN, BC-ADM Inpatient Diabetes Coordinator Pager [phone] (8a-5p)

## 2019-04-22 LAB — CBC
HCT: 39 % (ref 39.0–52.0)
Hemoglobin: 13 g/dL (ref 13.0–17.0)
MCH: 25.9 pg — ABNORMAL LOW (ref 26.0–34.0)
MCHC: 33.3 g/dL (ref 30.0–36.0)
MCV: 77.8 fL — ABNORMAL LOW (ref 80.0–100.0)
Platelets: 143 10*3/uL — ABNORMAL LOW (ref 150–400)
RBC: 5.01 MIL/uL (ref 4.22–5.81)
RDW: 14.3 % (ref 11.5–15.5)
WBC: 8.1 10*3/uL (ref 4.0–10.5)
nRBC: 0 % (ref 0.0–0.2)

## 2019-04-22 LAB — GLUCOSE, CAPILLARY
Glucose-Capillary: 146 mg/dL — ABNORMAL HIGH (ref 70–99)
Glucose-Capillary: 149 mg/dL — ABNORMAL HIGH (ref 70–99)
Glucose-Capillary: 152 mg/dL — ABNORMAL HIGH (ref 70–99)
Glucose-Capillary: 156 mg/dL — ABNORMAL HIGH (ref 70–99)
Glucose-Capillary: 164 mg/dL — ABNORMAL HIGH (ref 70–99)
Glucose-Capillary: 169 mg/dL — ABNORMAL HIGH (ref 70–99)
Glucose-Capillary: 173 mg/dL — ABNORMAL HIGH (ref 70–99)
Glucose-Capillary: 173 mg/dL — ABNORMAL HIGH (ref 70–99)
Glucose-Capillary: 182 mg/dL — ABNORMAL HIGH (ref 70–99)
Glucose-Capillary: 183 mg/dL — ABNORMAL HIGH (ref 70–99)
Glucose-Capillary: 186 mg/dL — ABNORMAL HIGH (ref 70–99)

## 2019-04-22 LAB — BASIC METABOLIC PANEL
Anion gap: 5 (ref 5–15)
BUN: 11 mg/dL (ref 6–20)
CO2: 25 mmol/L (ref 22–32)
Calcium: 6.9 mg/dL — ABNORMAL LOW (ref 8.9–10.3)
Chloride: 106 mmol/L (ref 98–111)
Creatinine, Ser: 0.76 mg/dL (ref 0.61–1.24)
GFR calc Af Amer: >60 mL/min (ref >60)
GFR calc non Af Amer: >60 mL/min (ref >60)
Glucose, Bld: 175 mg/dL — ABNORMAL HIGH (ref 70–99)
Potassium: 3.3 mmol/L — ABNORMAL LOW (ref 3.5–5.1)
Sodium: 136 mmol/L (ref 135–145)

## 2019-04-22 LAB — HEMOGLOBIN A1C
Hgb A1c MFr Bld: 13.2 % — ABNORMAL HIGH (ref 4.8–5.6)
Mean Plasma Glucose: 332.14 mg/dL

## 2019-04-22 LAB — LIPASE, BLOOD: Lipase: 148 U/L — ABNORMAL HIGH (ref 11–51)

## 2019-04-22 LAB — LACTIC ACID, PLASMA: Lactic Acid, Venous: 1.3 mmol/L (ref 0.5–1.9)

## 2019-04-22 MED ORDER — HYDROMORPHONE 1 MG/ML IV SOLN
INTRAVENOUS | Status: DC
  Administered 2019-04-22: 3.22 mg via INTRAVENOUS
  Administered 2019-04-23: 1.98 mg via INTRAVENOUS
  Administered 2019-04-23: 1.8 mg via INTRAVENOUS
  Administered 2019-04-23 (×2): 2.4 mg via INTRAVENOUS
  Administered 2019-04-23: 30 mg via INTRAVENOUS
  Administered 2019-04-23 (×2): 2.1 mg via INTRAVENOUS
  Administered 2019-04-24: 2.4 mg via INTRAVENOUS
  Administered 2019-04-24: 1.8 mg via INTRAVENOUS
  Administered 2019-04-24: 2.1 mg via INTRAVENOUS
  Administered 2019-04-24: 1.8 mg via INTRAVENOUS
  Administered 2019-04-24: 3 mg via INTRAVENOUS
  Administered 2019-04-25: 2.1 mg via INTRAVENOUS
  Administered 2019-04-25: 30 mg via INTRAVENOUS
  Administered 2019-04-25 (×3): 2.1 mg via INTRAVENOUS
  Administered 2019-04-26: 1.2 mg via INTRAVENOUS
  Filled 2019-04-22 (×2): qty 30

## 2019-04-22 MED ORDER — INSULIN GLARGINE 100 UNIT/ML ~~LOC~~ SOLN
15.0000 [IU] | Freq: Every day | SUBCUTANEOUS | Status: DC
  Administered 2019-04-22 – 2019-04-23 (×2): 15 [IU] via SUBCUTANEOUS
  Filled 2019-04-22 (×2): qty 0.15

## 2019-04-22 MED ORDER — INSULIN ASPART 100 UNIT/ML ~~LOC~~ SOLN
0.0000 [IU] | SUBCUTANEOUS | Status: DC
  Administered 2019-04-22 (×3): 2 [IU] via SUBCUTANEOUS
  Administered 2019-04-23: 3 [IU] via SUBCUTANEOUS
  Administered 2019-04-23 (×2): 2 [IU] via SUBCUTANEOUS
  Administered 2019-04-23: 3 [IU] via SUBCUTANEOUS
  Administered 2019-04-23: 2 [IU] via SUBCUTANEOUS
  Administered 2019-04-23: 3 [IU] via SUBCUTANEOUS
  Administered 2019-04-24 – 2019-04-25 (×13): 2 [IU] via SUBCUTANEOUS
  Administered 2019-04-26: 3 [IU] via SUBCUTANEOUS
  Administered 2019-04-26 (×3): 2 [IU] via SUBCUTANEOUS
  Administered 2019-04-26 – 2019-04-27 (×2): 3 [IU] via SUBCUTANEOUS
  Administered 2019-04-27 (×2): 2 [IU] via SUBCUTANEOUS
  Administered 2019-04-27: 3 [IU] via SUBCUTANEOUS
  Administered 2019-04-27 (×2): 2 [IU] via SUBCUTANEOUS
  Administered 2019-04-27 – 2019-04-29 (×9): 3 [IU] via SUBCUTANEOUS

## 2019-04-22 MED ORDER — POTASSIUM CHLORIDE 10 MEQ/100ML IV SOLN
10.0000 meq | INTRAVENOUS | Status: AC
  Administered 2019-04-22 (×4): 10 meq via INTRAVENOUS
  Filled 2019-04-22: qty 100

## 2019-04-22 NOTE — Progress Notes (Signed)
MEDICATION RELATED CONSULT NOTE - INITIAL   Pharmacy Consult for Pain Management Indication: Pancreatitis  Allergies  Allergen Reactions  . Penicillins Anaphylaxis, Hives and Swelling    Did it involve swelling of the face/tongue/throat, SOB, or low BP? Yes Did it involve sudden or severe rash/hives, skin peeling, or any reaction on the inside of your mouth or nose? Yes Did you need to seek medical attention at a hospital or doctor's office? Yes When did it last happen?teenager If all above answers are "NO", may proceed with cephalosporin use.  . Clindamycin Other (See Comments)    Unknown reaction - reported by [facility] 09-30-13  . Metoprolol Other (See Comments)    Chest pains - reported by [facility] 02/08/14    Patient Measurements: Height: 5\' 11"  (180.3 cm) Weight: (!) 320 lb (145.2 kg) IBW/kg (Calculated) : 75.3   Vital Signs: Temp: 98.1 F (36.7 C) (01/06 0738) Temp Source: Oral (01/06 0738) BP: 136/68 (01/06 0407) Pulse Rate: 113 (01/06 0407) Intake/Output from previous day: 01/05 0701 - 01/06 0700 In: 4502.2 [I.V.:4502.2] Out: 950 [Urine:950] Intake/Output from this shift: No intake/output data recorded.  Labs: Recent Labs    04/20/19 0923 04/20/19 1253 04/20/19 1708 04/20/19 1747 04/21/19 0058 04/21/19 0630 04/21/19 1416 04/22/19 0729  WBC 15.7*  --   --    < >  --  12.6* 11.1* 8.1  HGB 17.5*  --   --    < >  --  15.7 15.1 13.0  HCT 55.8*  --   --    < >  --  49.5 46.2 39.0  PLT 302  --   --    < >  --  175 191 143*  CREATININE 1.77*   < >  --   --  1.28* 1.07  --  0.76  MG  --   --  2.2  --   --   --   --   --   PHOS  --   --  3.5  --   --   --   --   --   ALBUMIN 4.0  --   --   --   --  2.9*  --   --   PROT 7.9  --   --   --   --  6.3*  --   --   AST 51*  --   --   --   --  89*  --   --   ALT 45*  --   --   --   --  40  --   --   ALKPHOS 93  --   --   --   --  61  --   --   BILITOT 1.4*  --   --   --   --  1.3*  --   --     < > = values in this interval not displayed.   Estimated Creatinine Clearance: 157.8 mL/min (by C-G formula based on SCr of 0.76 mg/dL).   Microbiology: Recent Results (from the past 720 hour(s))  Culture, blood (routine x 2)     Status: None (Preliminary result)   Collection Time: 04/20/19 12:37 PM   Specimen: BLOOD  Result Value Ref Range Status   Specimen Description BLOOD LEFT ANTECUBITAL  Final   Special Requests   Final    BOTTLES DRAWN AEROBIC AND ANAEROBIC Blood Culture results may not be optimal due to  an inadequate volume of blood received in culture bottles   Culture   Final    NO GROWTH < 24 HOURS Performed at [facility], [address] [place], [place] [address]    Report Status PENDING  Incomplete  Respiratory Panel by RT PCR (Flu A&B, Covid) - Nasopharyngeal Swab     Status: None   Collection Time: 04/20/19  3:53 PM   Specimen: Nasopharyngeal Swab  Result Value Ref Range Status   SARS Coronavirus 2 by RT PCR NEGATIVE NEGATIVE Final    Comment: (NOTE) SARS-CoV-2 target nucleic acids are NOT DETECTED. The SARS-CoV-2 RNA is generally detectable in upper respiratoy specimens during the acute phase of infection. The lowest concentration of SARS-CoV-2 viral copies this assay can detect is 131 copies/mL. A negative result does not preclude SARS-Cov-2 infection and should not be used as the sole basis for treatment or other patient management decisions. A negative result may occur with  improper specimen collection/handling, submission of specimen other than nasopharyngeal swab, presence of viral mutation(s) within the areas targeted by this assay, and inadequate number of viral copies (<131 copies/mL). A negative result must be combined with clinical observations, patient history, and epidemiological information. The expected result is Negative. Fact Sheet for Patients:  https://www.moore.com/ Fact Sheet for Healthcare Providers:   https://www.young.biz/ This test is not yet ap proved or cleared by the [place] FDA and  has been authorized for detection and/or diagnosis of SARS-CoV-2 by FDA under an Emergency Use Authorization (EUA). This EUA will remain  in effect (meaning this test can be used) for the duration of the COVID-19 declaration under Section 564(b)(1) of the Act, 21 U.S.C. section 360bbb-3(b)(1), unless the authorization is terminated or revoked sooner.    Influenza A by PCR NEGATIVE NEGATIVE Final   Influenza B by PCR NEGATIVE NEGATIVE Final    Comment: (NOTE) The Xpert Xpress SARS-CoV-2/FLU/RSV assay is intended as an aid in  the diagnosis of influenza from Nasopharyngeal swab specimens and  should not be used as a sole basis for treatment. Nasal washings and  aspirates are unacceptable for Xpert Xpress SARS-CoV-2/FLU/RSV  testing. Fact Sheet for Patients: https://www.moore.com/ Fact Sheet for Healthcare Providers: https://www.young.biz/ This test is not yet approved or cleared by the [place] FDA and  has been authorized for detection and/or diagnosis of SARS-CoV-2 by  FDA under an Emergency Use Authorization (EUA). This EUA will remain  in effect (meaning this test can be used) for the duration of the  Covid-19 declaration under Section 564(b)(1) of the Act, 21  U.S.C. section 360bbb-3(b)(1), unless the authorization is  terminated or revoked. Performed at [facility], [address], [place], [place] [address]     Medical History: Past Medical History:  Diagnosis Date  . Diabetes mellitus without complication (HCC)   . Gout     Medications:  Scheduled:  . allopurinol  100 mg Oral Daily  . heparin  5,000 Units Subcutaneous Q8H  . HYDROmorphone   Intravenous Q4H    Assessment: 53 YOM admitted with abd pain, N/V and found to have DKA and acute pancreatitis. The patient was started on a full-dose dilaudid  PCA yesterday and still with uncontrolled pain per GI notes. Pharmacy consulted to provide recommendations for pain management.   The patient is currently NPO due to the pancreatitis so pain management will need to be provided intravenously or via a patch.   Of noting the patient did appear comfortable and he stated his  pain was "better" when I interviewed him this morning.   The pump was reviewed with the nursing staff - since initiation almost ~24 hours ago the patient has received a total of 9.8 mg of dilaudid, with 47 demands and 31 delivered doses.   The 1 hour dose limit is set at 1.25 mg - which means that the patient could deliver up to 30 mg per 24 hours and only delivered ~10 mg. I don't feel like making adjustments to the pump is absolutely necessary at this time however it is noted that the patient currently does not have a basal infusion rate. Adding a basal infusion rate would provide more consistent pain control and still allow for prn use of the PCA pump. Given the patient's last 24 hour use - would recommend starting at a low rate of 0.3 mg/hr which can be titrated further if needed. The patient was also educated on the 8 minute lock-out interval.   The patient needs to be monitored closely for over sedation and respiratory depression. The patient requires continuous ETCO2 monitoring while on the PCA pump. Be aware that administration of other sedative medications (e.g. benzodiazepines (Ativan, Valium, etc.), phenothiazines (Phenergan, etc.), antihistamines (Benadryl, etc.) can potentiate opioid effects.  Goal of Therapy:  Pain Control  Plan: - Patient education provided on appropriate use of the PCA pump and 8 minute lockout interval - Would recommend adding a basal rate of 0.3 mg/hr to help provide more consistent pain relief - Recommendations discussed with Dr. Catha Gosselin and will add the basal rate as discussed above.  - Pharmacy will monitor peripherally  Thank you for  allowing pharmacy to be a part of this patient's care.  Georgina Pillion, PharmD, BCPS Clinical Pharmacist Clinical phone for 04/22/2019: [phone] 04/22/2019 9:34 AM   **Pharmacist phone directory can now be found on [email or web address] ([email or web address]).  Listed under [facility].

## 2019-04-22 NOTE — Progress Notes (Signed)
Subjective: Patient complains about pain.  The full dose Dilaudid PCA is not helping.  Objective: Vital signs in last 24 hours: Temp:  [98 F (36.7 C)-99.4 F (37.4 C)] 98.2 F (36.8 C) (01/06 0407) Pulse Rate:  [108-119] 113 (01/06 0407) Resp:  [20-31] 20 (01/06 0407) BP: (119-151)/(61-104) 136/68 (01/06 0407) SpO2:  [91 %-100 %] 99 % (01/06 0407) Weight:  [145.2 kg] 145.2 kg (01/05 1538) Last BM Date: 04/21/19  Intake/Output from previous day: 01/05 0701 - 01/06 0700 In: 4502.2 [I.V.:4502.2] Out: 950 [Urine:950] Intake/Output this shift: No intake/output data recorded.  General appearance: moderate distress Resp: clear to auscultation bilaterally Cardio: regular rate and rhythm and tachycardic GI: tender in the upper abdomen Extremities: extremities normal, atraumatic, no cyanosis or edema  Lab Results: Recent Labs    04/21/19 0126 04/21/19 0630 04/21/19 1416  WBC 14.0* 12.6* 11.1*  HGB 16.6 15.7 15.1  HCT 51.0 49.5 46.2  PLT 181 175 191   BMET Recent Labs    04/20/19 1747 04/21/19 0058 04/21/19 0630  NA 130* 135 136  K 5.9* 4.2 4.8  CL 100 103 106  CO2 9* 20* 18*  GLUCOSE 693* 219* 198*  BUN 30* 34* 28*  CREATININE 1.81* 1.28* 1.07  CALCIUM 7.2* 8.0* 7.6*   LFT Recent Labs    04/21/19 0630  PROT 6.3*  ALBUMIN 2.9*  AST 89*  ALT 40  ALKPHOS 61  BILITOT 1.3*   PT/INR No results for input(s): LABPROT, INR in the last 72 hours. Hepatitis Panel No results for input(s): HEPBSAG, HCVAB, HEPAIGM, HEPBIGM in the last 72 hours. C-Diff No results for input(s): CDIFFTOX in the last 72 hours. Fecal Lactopherrin No results for input(s): FECLLACTOFRN in the last 72 hours.  Studies/Results: CT ABDOMEN PELVIS WO CONTRAST  Addendum Date: 04/20/2019   ADDENDUM REPORT: 04/20/2019 13:40 ADDENDUM: The patient does have a midline skin lesion over the mid sternum measuring approximately 4.0 by 1.5 cm of unknown etiology. Electronically Signed   By: Francene Boyers M.D.   On: 04/20/2019 13:40   Result Date: 04/20/2019 CLINICAL DATA:  Upper abdominal pain with nausea and vomiting. Acute pancreatitis. EXAM: CT ABDOMEN AND PELVIS WITHOUT CONTRAST TECHNIQUE: Multidetector CT imaging of the abdomen and pelvis was performed following the standard protocol without IV contrast. COMPARISON:  None. FINDINGS: Lower chest: No acute abnormality. Tiny calcified granuloma in the right lower lobe. No effusions. Hepatobiliary: Severe hepatic steatosis. Cholecystectomy. No focal liver lesions. No biliary ductal dilatation. Pancreas: Peripancreatic soft tissue edema with edema extending into the mesentery with a small amount of ascites around the spleen and around the inferior aspect of the right lobe of the liver. Spleen: Normal in size without focal abnormality. Adrenals/Urinary Tract: Adrenal glands are unremarkable. Kidneys are normal, without renal calculi, focal lesion, or hydronephrosis. Bladder is unremarkable. Stomach/Bowel: Stomach is within normal limits. Appendix appears normal. No evidence of bowel wall thickening, distention, or inflammatory changes. Vascular/Lymphatic: No significant vascular findings are present. No enlarged abdominal or pelvic lymph nodes. Reproductive: Prostate is unremarkable. Other: There is a small amount of ascites pericolic gutters and adjacent to the spleen and inferior aspect the liver as well as mesenteric edema, all felt pancreatitis. No abdominal wall hernias. Musculoskeletal: No acute or significant osseous findings. IMPRESSION: 1. Acute pancreatitis. 2. Severe hepatic steatosis. 3. Small amount of ascites. Electronically Signed: By: Francene Boyers M.D. On: 04/20/2019 13:14   DG Chest 2 View  Result Date: 04/20/2019 CLINICAL DATA:  Shortness of breath and  dizziness EXAM: CHEST - 2 VIEW COMPARISON:  None. FINDINGS: Lungs are clear. Heart is upper normal in size with pulmonary vascularity normal. No adenopathy. No bone lesions. IMPRESSION:  No edema or consolidation.  Heart upper normal in size. Electronically Signed   By: Lowella Grip III M.D.   On: 04/20/2019 10:03    Medications:  Scheduled: . allopurinol  100 mg Oral Daily  . heparin  5,000 Units Subcutaneous Q8H  . HYDROmorphone   Intravenous Q4H   Continuous: . sodium chloride Stopped (04/21/19 1549)  . dextrose 5 % and 0.45% NaCl 75 mL/hr at 04/22/19 0437  . insulin 4.8 Units/hr (04/22/19 0157)  . lactated ringers 300 mL/hr at 04/22/19 4010    Assessment/Plan: 1) Acute pancreatitis. 2) Uncontrolled pancreatitis pain. 3) Poorly controlled DM.   The patient has responded very well with IV hydration.  His urine output was 900 ml over the past 24 hours.  The AM labs are pending, but it is anticipated that his HCT will continue to drop down towards his baseline.  The pancreatitis pain appears to be the uncontrolled issue at this time.  The full dose Dilaudid PCA is not adequately controlling his pain.  Nursing reports that he was not able to sleep all night.  Plan: 1) Decrease LR down to 200 ml. 2) Pharmacy consult for better pain management. 3) Continue with blood sugar control per Hospitalist.  A1C is at 13%.  LOS: 2 days   Treveon Bourcier D 04/22/2019, 7:22 AM

## 2019-04-22 NOTE — Progress Notes (Signed)
Inpatient Diabetes Program Recommendations  AACE/ADA: New Consensus Statement on Inpatient Glycemic Control (2015)  Target Ranges:  Prepandial:   less than 140 mg/dL      Peak postprandial:   less than 180 mg/dL (1-2 hours)      Critically ill patients:  140 - 180 mg/dL   Lab Results  Component Value Date   GLUCAP 173 (H) 04/22/2019   HGBA1C 13.2 (H) 04/22/2019    Review of Glycemic Control  Diabetes history: Dm 2 Outpatient Diabetes medications: Basaglar 15 units one day, 10 units next day, 5 units the next day then regimen starts over. Trulicity 1.5 mg Saturday, Metformin 2000 mg Daily  Current orders for Inpatient glycemic control:  Lantus 15 units Daily  Inpatient Diabetes Program Recommendations:    Spoke with pt over the phone regarding A1c and glucose control at home. Pt reports seeing his PCP every 3 months and no changes were made at last visit. Discussed A1c level, glucose and A1c goals. Pt reports not having a meter at home and discussed that he could do better with his diet. Briefly discussed diet modifications and portion size to patient. Will attach education to VS for pt reference.   Note pt on Trulicity at home. Pt reports being on this for a long time, however one side effect is pancreatitis and GI side effects. Unsure how much this contributed to pt. Recommend Trulicity should not be restarted at time of d/c according to drug information.  Pt will need glucose meter kit at time of d/c. (order # [id number]).  Thanks,  Tama Headings RN, MSN, BC-ADM Inpatient Diabetes Coordinator Team Pager [phone] (8a-5p)

## 2019-04-22 NOTE — Plan of Care (Signed)
  Problem: Metabolic: Goal: Ability to maintain appropriate glucose levels will improve Outcome: Progressing   

## 2019-04-22 NOTE — Progress Notes (Signed)
PROGRESS NOTE    Caleb Chambers  QIH:[id number] DOB: [birth year] DOA: 04/20/2019 PCP: Patient, No Pcp Per   Brief Narrative:  HPI On 04/20/2019 by Dr. Wynetta Fines Caleb Chambers is a 54 y.o. male with medical history significant of IDDM, HTN, abdominal pain and vomiting. Started 2-3 days ago after heavy meal. Pain is diffuse across his upper abdomen and severe, cramping like. He has not taken anything specifically for it. He has had 20-30 episodes of vomiting and loose diarrhea as well. No fevers or cough. He stopped taking all his meds, including insulin and the Metformin since yesterday.  Interim history Admitted with DKA and acute pancreatitis.  Gastroenterology also consulted.  Patient was on an insulin drip however has now been transitioned off.  We will continue to monitor closely. Assessment & Plan   Acute pancreatitis/mildly elevated LFTs with abdominal pain, nausea, vomiting -Lipase 1319 on admission, however improved down to 142 -Patient was placed on IV fluids, currently n.p.o. -Has had a history of cholecystectomy -Gastroenterology consulted and appreciated.  Dr. Geoffry Paradise has recommended a PCA pump for his pain along with continuous aggressive IV fluid hydration -Continue antiemetics  -The patient is on a PCA pump, he feels his pain is not well controlled.  DKA, anion gap metabolic acidosis/ Diabetes mellitus, type II -Suspect secondary to the above process -Presented with with anion gap of 26, glucose of 870 -Has now improved and closed, bicarb is also resolved and up to 25 today. -Will transition off of insulin drip-started Lantus and insulin sliding scale every 4 hours -Diabetes coordinator consulted and appreciated -Hemoglobin A1c 13.2 -Of note patient is on Trulicity at home which may cause pancreatitis.  Should be discontinued upon discharge.  Sepsis  -present on admission -unknown source, possibly secondary to the above -Leukocytosis and lactic acidosis have  resolved -Patient continues to be tachycardic and tachypneic which may be secondary to pain  Essential hypertension -BP currently stable -Lisinopril held  Acute kidney injury -Creatinine on admission was 1.77, slight worsening to 1.81 -However this morning is improved to 0.76 -Continue to monitor BMP  Hyponatremia -Sodium is 129 on admission, suspect secondary to DKA -Has since resolved, and improved to 136  DVT Prophylaxis Heparin  Code Status: Full  Family Communication: None at bedside  Disposition Plan: Admitted.  Pending improvement in abdominal pain/pancreatitis as well as GI recommendations.  Suspect home when stable  Consultants Gastroenterology  Procedures  None  Antibiotics   Anti-infectives (From admission, onward)   None      Subjective:   Caleb Chambers seen and examined today.  Continues to complain of abdominal pain especially in the upper part of the abdomen.  Denies any nausea or vomiting.  Denies any chest pain or shortness of breath, dizziness or headache. Objective:   Vitals:   04/22/19 0400 04/22/19 0407 04/22/19 0738 04/22/19 0848  BP:  136/68    Pulse:  (!) 113    Resp: (!) 22 20  (!) 22  Temp:  98.2 F (36.8 C) 98.1 F (36.7 C)   TempSrc:  Oral Oral   SpO2: 100% 99%  96%  Weight:      Height:        Intake/Output Summary (Last 24 hours) at 04/22/2019 0946 Last data filed at 04/22/2019 0311 Gross per 24 hour  Intake 4470.87 ml  Output 950 ml  Net 3520.87 ml   Filed Weights   04/21/19 1538  Weight: (!) 145.2 kg   Exam  General: Well  developed, well nourished, NAD, appears stated age  HEENT: NCAT, mucous membranes moist.   Cardiovascular: S1 S2 auscultated, tachycardic  Respiratory: Tachypneic, however clear to auscultation  Abdomen: Soft, obese, TTP upper abdomen, nondistended, + bowel sounds  Extremities: warm dry without cyanosis clubbing or edema  Neuro: AAOx3, nonfocal  Psych: Appropriate mood and affect  Data  Reviewed: I have personally reviewed following labs and imaging studies  CBC: Recent Labs  Lab 04/20/19 0923 04/20/19 1253 04/21/19 0126 04/21/19 0630 04/21/19 1416 04/22/19 0729  WBC 15.7*  --  14.0* 12.6* 11.1* 8.1  HGB 17.5* 19.0* 16.6 15.7 15.1 13.0  HCT 55.8* 56.0* 51.0 49.5 46.2 39.0  MCV 80.6  --  78.2* 81.4 77.5* 77.8*  PLT 302  --  181 175 191 143*   Basic Metabolic Panel: Recent Labs  Lab 04/20/19 0923 04/20/19 1253 04/20/19 1708 04/20/19 1747 04/21/19 0058 04/21/19 0630 04/22/19 0729  NA 129* 124*  --  130* 135 136 136  K 5.1 4.9  --  5.9* 4.2 4.8 3.3*  CL 93*  --   --  100 103 106 106  CO2 10*  --   --  9* 20* 18* 25  GLUCOSE 870*  --   --  693* 219* 198* 175*  BUN 28*  --   --  30* 34* 28* 11  CREATININE 1.77*  --   --  1.81* 1.28* 1.07 0.76  CALCIUM 9.0  --   --  7.2* 8.0* 7.6* 6.9*  MG  --   --  2.2  --   --   --   --   PHOS  --   --  3.5  --   --   --   --    GFR: Estimated Creatinine Clearance: 157.8 mL/min (by C-G formula based on SCr of 0.76 mg/dL). Liver Function Tests: Recent Labs  Lab 04/20/19 0923 04/21/19 0630  AST 51* 89*  ALT 45* 40  ALKPHOS 93 61  BILITOT 1.4* 1.3*  PROT 7.9 6.3*  ALBUMIN 4.0 2.9*   Recent Labs  Lab 04/20/19 0923 04/21/19 0630 04/21/19 1416 04/22/19 0729  LIPASE 1,319* 684* 554* 148*   No results for input(s): AMMONIA in the last 168 hours. Coagulation Profile: No results for input(s): INR, PROTIME in the last 168 hours. Cardiac Enzymes: No results for input(s): CKTOTAL, CKMB, CKMBINDEX, TROPONINI in the last 168 hours. BNP (last 3 results) No results for input(s): PROBNP in the last 8760 hours. HbA1C: Recent Labs    04/20/19 1708 04/22/19 0243  HGBA1C 13.1* 13.2*   CBG: Recent Labs  Lab 04/22/19 0203 04/22/19 0410 04/22/19 0612 04/22/19 0802 04/22/19 0909  GLUCAP 169* 156* 183* 146* 173*   Lipid Profile: Recent Labs    04/20/19 1708  CHOL 98  HDL 39*  LDLCALC 45  TRIG 70  CHOLHDL  2.5   Thyroid Function Tests: No results for input(s): TSH, T4TOTAL, FREET4, T3FREE, THYROIDAB in the last 72 hours. Anemia Panel: No results for input(s): VITAMINB12, FOLATE, FERRITIN, TIBC, IRON, RETICCTPCT in the last 72 hours. Urine analysis:    Component Value Date/Time   COLORURINE YELLOW 04/20/2019 1708   APPEARANCEUR CLEAR 04/20/2019 1708   LABSPEC 1.022 04/20/2019 1708   PHURINE 5.0 04/20/2019 1708   GLUCOSEU >=500 (A) 04/20/2019 1708   HGBUR SMALL (A) 04/20/2019 1708   BILIRUBINUR NEGATIVE 04/20/2019 1708   KETONESUR 5 (A) 04/20/2019 1708   PROTEINUR NEGATIVE 04/20/2019 1708   UROBILINOGEN 0.2 09/12/2018 0949   NITRITE  NEGATIVE 04/20/2019 1708   LEUKOCYTESUR NEGATIVE 04/20/2019 1708   Sepsis Labs: @LABRCNTIP (procalcitonin:4,lacticidven:4)  ) Recent Results (from the past 240 hour(s))  Culture, blood (routine x 2)     Status: None (Preliminary result)   Collection Time: 04/20/19 12:37 PM   Specimen: BLOOD  Result Value Ref Range Status   Specimen Description BLOOD LEFT ANTECUBITAL  Final   Special Requests   Final    BOTTLES DRAWN AEROBIC AND ANAEROBIC Blood Culture results may not be optimal due to an inadequate volume of blood received in culture bottles   Culture   Final    NO GROWTH < 24 HOURS Performed at [facility], [address] [place], [place]    Report Status PENDING  Incomplete  Respiratory Panel by RT PCR (Flu A&B, Covid) - Nasopharyngeal Swab     Status: None   Collection Time: 04/20/19  3:53 PM   Specimen: Nasopharyngeal Swab  Result Value Ref Range Status   SARS Coronavirus 2 by RT PCR NEGATIVE NEGATIVE Final    Comment: (NOTE) SARS-CoV-2 target nucleic acids are NOT DETECTED. The SARS-CoV-2 RNA is generally detectable in upper respiratoy specimens during the acute phase of infection. The lowest concentration of SARS-CoV-2 viral copies this assay can detect is 131 copies/mL. A negative result does not preclude  SARS-Cov-2 infection and should not be used as the sole basis for treatment or other patient management decisions. A negative result may occur with  improper specimen collection/handling, submission of specimen other than nasopharyngeal swab, presence of viral mutation(s) within the areas targeted by this assay, and inadequate number of viral copies (<131 copies/mL). A negative result must be combined with clinical observations, patient history, and epidemiological information. The expected result is Negative. Fact Sheet for Patients:  06/18/19 Fact Sheet for Healthcare Providers:  https://www.moore.com/ This test is not yet ap proved or cleared by the https://www.young.biz/ FDA and  has been authorized for detection and/or diagnosis of SARS-CoV-2 by FDA under an Emergency Use Authorization (EUA). This EUA will remain  in effect (meaning this test can be used) for the duration of the COVID-19 declaration under Section 564(b)(1) of the Act, 21 U.S.C. section 360bbb-3(b)(1), unless the authorization is terminated or revoked sooner.    Influenza A by PCR NEGATIVE NEGATIVE Final   Influenza B by PCR NEGATIVE NEGATIVE Final    Comment: (NOTE) The Xpert Xpress SARS-CoV-2/FLU/RSV assay is intended as an aid in  the diagnosis of influenza from Nasopharyngeal swab specimens and  should not be used as a sole basis for treatment. Nasal washings and  aspirates are unacceptable for Xpert Xpress SARS-CoV-2/FLU/RSV  testing. Fact Sheet for Patients: [place] Fact Sheet for Healthcare Providers: https://www.moore.com/ This test is not yet approved or cleared by the https://www.young.biz/ FDA and  has been authorized for detection and/or diagnosis of SARS-CoV-2 by  FDA under an Emergency Use Authorization (EUA). This EUA will remain  in effect (meaning this test can be used) for the duration of the  Covid-19  declaration under Section 564(b)(1) of the Act, 21  U.S.C. section 360bbb-3(b)(1), unless the authorization is  terminated or revoked. Performed at [facility], [address] [place], [place]       Radiology Studies: CT ABDOMEN PELVIS WO CONTRAST  Addendum Date: 04/20/2019   ADDENDUM REPORT: 04/20/2019 13:40 ADDENDUM: The patient does have a midline skin lesion over the mid sternum measuring approximately 4.0 by 1.5 cm of unknown etiology. Electronically Signed  By: Francene Boyers M.D.   On: 04/20/2019 13:40   Result Date: 04/20/2019 CLINICAL DATA:  Upper abdominal pain with nausea and vomiting. Acute pancreatitis. EXAM: CT ABDOMEN AND PELVIS WITHOUT CONTRAST TECHNIQUE: Multidetector CT imaging of the abdomen and pelvis was performed following the standard protocol without IV contrast. COMPARISON:  None. FINDINGS: Lower chest: No acute abnormality. Tiny calcified granuloma in the right lower lobe. No effusions. Hepatobiliary: Severe hepatic steatosis. Cholecystectomy. No focal liver lesions. No biliary ductal dilatation. Pancreas: Peripancreatic soft tissue edema with edema extending into the mesentery with a small amount of ascites around the spleen and around the inferior aspect of the right lobe of the liver. Spleen: Normal in size without focal abnormality. Adrenals/Urinary Tract: Adrenal glands are unremarkable. Kidneys are normal, without renal calculi, focal lesion, or hydronephrosis. Bladder is unremarkable. Stomach/Bowel: Stomach is within normal limits. Appendix appears normal. No evidence of bowel wall thickening, distention, or inflammatory changes. Vascular/Lymphatic: No significant vascular findings are present. No enlarged abdominal or pelvic lymph nodes. Reproductive: Prostate is unremarkable. Other: There is a small amount of ascites pericolic gutters and adjacent to the spleen and inferior aspect the liver as well as mesenteric edema, all felt pancreatitis. No  abdominal wall hernias. Musculoskeletal: No acute or significant osseous findings. IMPRESSION: 1. Acute pancreatitis. 2. Severe hepatic steatosis. 3. Small amount of ascites. Electronically Signed: By: Francene Boyers M.D. On: 04/20/2019 13:14   DG Chest 2 View  Result Date: 04/20/2019 CLINICAL DATA:  Shortness of breath and dizziness EXAM: CHEST - 2 VIEW COMPARISON:  None. FINDINGS: Lungs are clear. Heart is upper normal in size with pulmonary vascularity normal. No adenopathy. No bone lesions. IMPRESSION: No edema or consolidation.  Heart upper normal in size. Electronically Signed   By: Bretta Bang III M.D.   On: 04/20/2019 10:03     Scheduled Meds: . allopurinol  100 mg Oral Daily  . heparin  5,000 Units Subcutaneous Q8H  . HYDROmorphone   Intravenous Q4H  . insulin glargine  15 Units Subcutaneous Daily   Continuous Infusions: . sodium chloride Stopped (04/21/19 1549)  . dextrose 5 % and 0.45% NaCl 75 mL/hr at 04/22/19 0437  . insulin 4.8 Units/hr (04/22/19 0157)  . lactated ringers 300 mL/hr at 04/22/19 0712     LOS: 2 days   Time Spent in minutes   45 minutes  Caleb Chambers D.O. on 04/22/2019 at 9:46 AM  Between 7am to 7pm - Please see pager noted on [email or web address]  After 7pm go to [email or web address]  And look for the night coverage person covering for me after hours  [facility] Office  [phone]

## 2019-04-23 ENCOUNTER — Inpatient Hospital Stay (HOSPITAL_COMMUNITY): Payer: BC Managed Care – PPO

## 2019-04-23 LAB — COMPREHENSIVE METABOLIC PANEL
ALT: 27 U/L (ref 0–44)
AST: 38 U/L (ref 15–41)
Albumin: 2.3 g/dL — ABNORMAL LOW (ref 3.5–5.0)
Alkaline Phosphatase: 55 U/L (ref 38–126)
Anion gap: 14 (ref 5–15)
BUN: 10 mg/dL (ref 6–20)
CO2: 18 mmol/L — ABNORMAL LOW (ref 22–32)
Calcium: 7 mg/dL — ABNORMAL LOW (ref 8.9–10.3)
Chloride: 105 mmol/L (ref 98–111)
Creatinine, Ser: 0.79 mg/dL (ref 0.61–1.24)
GFR calc Af Amer: >60 mL/min (ref >60)
GFR calc non Af Amer: >60 mL/min (ref >60)
Glucose, Bld: 203 mg/dL — ABNORMAL HIGH (ref 70–99)
Potassium: 3.7 mmol/L (ref 3.5–5.1)
Sodium: 137 mmol/L (ref 135–145)
Total Bilirubin: 1.2 mg/dL (ref 0.3–1.2)
Total Protein: 5.8 g/dL — ABNORMAL LOW (ref 6.5–8.1)

## 2019-04-23 LAB — CBC
HCT: 36.9 % — ABNORMAL LOW (ref 39.0–52.0)
Hemoglobin: 11.9 g/dL — ABNORMAL LOW (ref 13.0–17.0)
MCH: 25.6 pg — ABNORMAL LOW (ref 26.0–34.0)
MCHC: 32.2 g/dL (ref 30.0–36.0)
MCV: 79.5 fL — ABNORMAL LOW (ref 80.0–100.0)
Platelets: 166 10*3/uL (ref 150–400)
RBC: 4.64 MIL/uL (ref 4.22–5.81)
RDW: 14.6 % (ref 11.5–15.5)
WBC: 7.5 10*3/uL (ref 4.0–10.5)
nRBC: 0 % (ref 0.0–0.2)

## 2019-04-23 LAB — GLUCOSE, CAPILLARY
Glucose-Capillary: 174 mg/dL — ABNORMAL HIGH (ref 70–99)
Glucose-Capillary: 176 mg/dL — ABNORMAL HIGH (ref 70–99)
Glucose-Capillary: 181 mg/dL — ABNORMAL HIGH (ref 70–99)
Glucose-Capillary: 188 mg/dL — ABNORMAL HIGH (ref 70–99)
Glucose-Capillary: 211 mg/dL — ABNORMAL HIGH (ref 70–99)
Glucose-Capillary: 211 mg/dL — ABNORMAL HIGH (ref 70–99)
Glucose-Capillary: 220 mg/dL — ABNORMAL HIGH (ref 70–99)

## 2019-04-23 LAB — LIPASE, BLOOD: Lipase: 63 U/L — ABNORMAL HIGH (ref 11–51)

## 2019-04-23 LAB — BRAIN NATRIURETIC PEPTIDE: B Natriuretic Peptide: 99.2 pg/mL (ref 0.0–100.0)

## 2019-04-23 MED ORDER — INSULIN GLARGINE 100 UNIT/ML ~~LOC~~ SOLN
3.0000 [IU] | Freq: Once | SUBCUTANEOUS | Status: AC
  Administered 2019-04-23: 3 [IU] via SUBCUTANEOUS
  Filled 2019-04-23: qty 0.03

## 2019-04-23 MED ORDER — FUROSEMIDE 10 MG/ML IJ SOLN
20.0000 mg | Freq: Once | INTRAMUSCULAR | Status: AC
  Administered 2019-04-23: 20 mg via INTRAVENOUS
  Filled 2019-04-23: qty 4

## 2019-04-23 MED ORDER — INSULIN GLARGINE 100 UNIT/ML ~~LOC~~ SOLN
18.0000 [IU] | Freq: Every day | SUBCUTANEOUS | Status: DC
  Administered 2019-04-24 – 2019-04-28 (×5): 18 [IU] via SUBCUTANEOUS
  Filled 2019-04-23 (×6): qty 0.18

## 2019-04-23 NOTE — Plan of Care (Signed)
  Problem: Metabolic: Goal: Ability to maintain appropriate glucose levels will improve Outcome: Progressing   

## 2019-04-23 NOTE — Progress Notes (Signed)
7 mg hydromorphone- PCA  was wasted,  witnessed by the charge nurse - Loree Fee- RN

## 2019-04-23 NOTE — Progress Notes (Signed)
Subjective: Feeling much better, but SOB.  Objective: Vital signs in last 24 hours: Temp:  [98 F (36.7 C)-99 F (37.2 C)] 98.2 F (36.8 C) (01/07 0340) Pulse Rate:  [113-118] 113 (01/07 0340) Resp:  [16-27] 19 (01/07 0415) BP: (141-163)/(82-106) 163/94 (01/07 0340) SpO2:  [93 %-100 %] 93 % (01/07 0415) Last BM Date: 04/21/19  Intake/Output from previous day: 01/06 0701 - 01/07 0700 In: 3610.2 [I.V.:2963.5; IV Piggyback:646.7] Out: 1150 [Urine:1150] Intake/Output this shift: No intake/output data recorded.  General appearance: alert and no distress Resp: some fine crackles in the left base Cardio: regular rate and rhythm GI: tender in the upper abdomen Extremities: extremities normal, atraumatic, no cyanosis or edema  Lab Results: Recent Labs    04/21/19 1416 04/22/19 0729 04/23/19 0450  WBC 11.1* 8.1 7.5  HGB 15.1 13.0 11.9*  HCT 46.2 39.0 36.9*  PLT 191 143* 166   BMET Recent Labs    04/21/19 0630 04/22/19 0729 04/23/19 0450  NA 136 136 137  K 4.8 3.3* 3.7  CL 106 106 105  CO2 18* 25 18*  GLUCOSE 198* 175* 203*  BUN 28* 11 10  CREATININE 1.07 0.76 0.79  CALCIUM 7.6* 6.9* 7.0*   LFT Recent Labs    04/23/19 0450  PROT 5.8*  ALBUMIN 2.3*  AST 38  ALT 27  ALKPHOS 55  BILITOT 1.2   PT/INR No results for input(s): LABPROT, INR in the last 72 hours. Hepatitis Panel No results for input(s): HEPBSAG, HCVAB, HEPAIGM, HEPBIGM in the last 72 hours. C-Diff No results for input(s): CDIFFTOX in the last 72 hours. Fecal Lactopherrin No results for input(s): FECLLACTOFRN in the last 72 hours.  Studies/Results: No results found.  Medications:  Scheduled: . allopurinol  100 mg Oral Daily  . furosemide  20 mg Intravenous Once  . heparin  5,000 Units Subcutaneous Q8H  . HYDROmorphone   Intravenous Q4H  . insulin aspart  0-9 Units Subcutaneous Q4H  . insulin glargine  15 Units Subcutaneous Daily   Continuous: . sodium chloride Stopped (04/21/19 1549)   . dextrose 5 % and 0.45% NaCl 75 mL/hr at 04/22/19 0437  . insulin Stopped (04/22/19 1238)  . lactated ringers 200 mL/hr at 04/23/19 8101    Assessment/Plan: 1) Acute pancreatitis. 2) DM. 3) SOB.   The patient's SOB is most likely from fluid overload with the aggressive IV hydration.  Fine crackles were noted today, which was no present yesterday.  Pain is much better controlled.  Plan: 1) Lasix 20 mg IV x 1. 2) Decrease IV hydration to 125 ml/hour.  LOS: 3 days   Janille Draughon D 04/23/2019, 7:15 AM

## 2019-04-23 NOTE — Progress Notes (Signed)
PROGRESS NOTE    Caleb Chambers  PFX:[id number] DOB: 08-14-1965 DOA: 04/20/2019 PCP: Patient, No Pcp Per   Brief Narrative:  HPI On 04/20/2019 by Dr. Mikey College Caleb Chambers is a 54 y.o. male with medical history significant of IDDM, HTN, abdominal pain and vomiting. Started 2-3 days ago after heavy meal. Pain is diffuse across his upper abdomen and severe, cramping like. He has not taken anything specifically for it. He has had 20-30 episodes of vomiting and loose diarrhea as well. No fevers or cough. He stopped taking all his meds, including insulin and the Metformin since yesterday.  Interim history Admitted with DKA and acute pancreatitis.  Gastroenterology also consulted.  Patient was on an insulin drip however has now been transitioned off.  Her diet is improving.  Patient now with shortness of breath, suspect volume overload.  Chest x-ray obtained showing volume overload, no history of heart failure.  Assessment & Plan   Acute pancreatitis/mildly elevated LFTs with abdominal pain, nausea, vomiting -Lipase 1319 on admission, however improved down to 63 -Patient was placed on IV fluids, currently n.p.o.- will discuss with GI if patient can be placed on clear fluids -Has had a history of cholecystectomy -Gastroenterology consulted and appreciated.  Dr. Alto Denver has recommended a PCA pump for his pain along with continuous aggressive IV fluid hydration.  However now fluids have been reduced. -Continue antiemetics  -Patient feels his pain is better this morning, currently on a loaded PCA  Dyspnea -Suspect secondary to volume overload -Chest x-ray does show low volumes, and volume overload -Would like to discontinue IV fluids, however waiting to discuss this with gastroenterology -was given 1 dose of IV Lasix 20 mg -No history of CHF -Will obtain BNP  DKA, anion gap metabolic acidosis/ Diabetes mellitus, type II -Suspect secondary to the above process -Presented with with anion gap  of 26, glucose of 870 -Has now improved and closed, bicarb is also resolved and up to 25 today. -Will transition off of insulin drip-started Lantus and insulin sliding scale every 4 hours -Diabetes coordinator consulted and appreciated -Hemoglobin A1c 13.2 -Of note patient is on Trulicity at home which may cause pancreatitis.  Should be discontinued upon discharge.  Sepsis  -present on admission -unknown source, possibly secondary to the above -Leukocytosis and lactic acidosis have resolved -Patient continues to be tachycardic and tachypneic which may be secondary to pain  Essential hypertension -BP currently stable -Lisinopril held  Acute kidney injury -Creatinine on admission was 1.77, slight worsening to 1.81 -However this morning is improved to 0.79 -Continue to monitor BMP  Hyponatremia -Sodium is 129 on admission, suspect secondary to DKA -Has since resolved, and improved to 137  DVT Prophylaxis Heparin  Code Status: Full  Family Communication: None at bedside  Disposition Plan: Admitted.  Pending improvement in abdominal pain/pancreatitis as well as GI recommendations, as well respiratory status.  Suspect home when stable  Consultants Gastroenterology  Procedures  None  Antibiotics   Anti-infectives (From admission, onward)   None      Subjective:   Caleb Chambers seen and examined today.  Patient feels abdominal pain has mildly improved would like to drink water.  Denies current nausea or vomiting.  Denies chest pain.  Does have shortness of breath.  Feels that sitting up makes his breathing better.  Denies current dizziness or headache. Objective:   Vitals:   04/23/19 0013 04/23/19 0340 04/23/19 0415 04/23/19 0824  BP:  (!) 163/94    Pulse:  [name])  113    Resp: (!) 21 16 19  (!) 24  Temp:  98.2 F (36.8 C)    TempSrc:  Oral    SpO2: 93% 97% 93% 97%  Weight:      Height:        Intake/Output Summary (Last 24 hours) at 04/23/2019 1044 Last data filed  at 04/23/2019 0900 Gross per 24 hour  Intake 3610.2 ml  Output 1750 ml  Net 1860.2 ml   Filed Weights   04/21/19 1538  Weight: (!) 145.2 kg   Exam  General: Well developed, well nourished, NAD, appears stated age  HEENT: NCAT, mucous membranes moist.   Cardiovascular: S1 S2 auscultated, tachycardic  Respiratory: Diminished however clear auscultation, no wheezing, tachypneic  Abdomen: Soft, obese, upper abdominal TTP, nondistended, + bowel sounds  Extremities: warm dry without cyanosis clubbing or edema  Neuro: AAOx3, nonfocal  Psych: Appropriate mood and affect  Data Reviewed: I have personally reviewed following labs and imaging studies  CBC: Recent Labs  Lab 04/21/19 0126 04/21/19 0630 04/21/19 1416 04/22/19 0729 04/23/19 0450  WBC 14.0* 12.6* 11.1* 8.1 7.5  HGB 16.6 15.7 15.1 13.0 11.9*  HCT 51.0 49.5 46.2 39.0 36.9*  MCV 78.2* 81.4 77.5* 77.8* 79.5*  PLT 181 175 191 143* 166   Basic Metabolic Panel: Recent Labs  Lab 04/20/19 0923 04/20/19 1708 04/20/19 1747 04/21/19 0058 04/21/19 0630 04/22/19 0729 04/23/19 0450  NA   < >  --  130* 135 136 136 137  K   < >  --  5.9* 4.2 4.8 3.3* 3.7  CL  --   --  100 103 106 106 105  CO2  --   --  9* 20* 18* 25 18*  GLUCOSE  --   --  693* 219* 198* 175* 203*  BUN  --   --  30* 34* 28* 11 10  CREATININE  --   --  1.81* 1.28* 1.07 0.76 0.79  CALCIUM  --   --  7.2* 8.0* 7.6* 6.9* 7.0*  MG  --  2.2  --   --   --   --   --   PHOS  --  3.5  --   --   --   --   --    < > = values in this interval not displayed.   GFR: Estimated Creatinine Clearance: 157.8 mL/min (by C-G formula based on SCr of 0.79 mg/dL). Liver Function Tests: Recent Labs  Lab 04/20/19 0923 04/21/19 0630 04/23/19 0450  AST 51* 89* 38  ALT 45* 40 27  ALKPHOS 93 61 55  BILITOT 1.4* 1.3* 1.2  PROT 7.9 6.3* 5.8*  ALBUMIN 4.0 2.9* 2.3*   Recent Labs  Lab 04/20/19 0923 04/21/19 0630 04/21/19 1416 04/22/19 0729 04/23/19 0450  LIPASE 1,319*  684* 554* 148* 63*   No results for input(s): AMMONIA in the last 168 hours. Coagulation Profile: No results for input(s): INR, PROTIME in the last 168 hours. Cardiac Enzymes: No results for input(s): CKTOTAL, CKMB, CKMBINDEX, TROPONINI in the last 168 hours. BNP (last 3 results) No results for input(s): PROBNP in the last 8760 hours. HbA1C: Recent Labs    04/20/19 1708 04/22/19 0243  HGBA1C 13.1* 13.2*   CBG: Recent Labs  Lab 04/22/19 2001 04/22/19 2347 04/23/19 0005 04/23/19 0335 04/23/19 0738  GLUCAP 186* 164* 188* 181* 211*   Lipid Profile: Recent Labs    04/20/19 1708  CHOL 98  HDL 39*  LDLCALC 45  TRIG 70  CHOLHDL 2.5   Thyroid Function Tests: No results for input(s): TSH, T4TOTAL, FREET4, T3FREE, THYROIDAB in the last 72 hours. Anemia Panel: No results for input(s): VITAMINB12, FOLATE, FERRITIN, TIBC, IRON, RETICCTPCT in the last 72 hours. Urine analysis:    Component Value Date/Time   COLORURINE YELLOW 04/20/2019 1708   APPEARANCEUR CLEAR 04/20/2019 1708   LABSPEC 1.022 04/20/2019 1708   PHURINE 5.0 04/20/2019 1708   GLUCOSEU >=500 (A) 04/20/2019 1708   HGBUR SMALL (A) 04/20/2019 1708   BILIRUBINUR NEGATIVE 04/20/2019 1708   KETONESUR 5 (A) 04/20/2019 1708   PROTEINUR NEGATIVE 04/20/2019 1708   UROBILINOGEN 0.2 09/12/2018 0949   NITRITE NEGATIVE 04/20/2019 1708   LEUKOCYTESUR NEGATIVE 04/20/2019 1708   Sepsis Labs: @LABRCNTIP (procalcitonin:4,lacticidven:4)  ) Recent Results (from the past 240 hour(s))  Culture, blood (routine x 2)     Status: None (Preliminary result)   Collection Time: 04/20/19 12:37 PM   Specimen: BLOOD  Result Value Ref Range Status   Specimen Description BLOOD LEFT ANTECUBITAL  Final   Special Requests   Final    BOTTLES DRAWN AEROBIC AND ANAEROBIC Blood Culture results may not be optimal due to an inadequate volume of blood received in culture bottles   Culture   Final    NO GROWTH 2 DAYS Performed at [facility], [address], [place], [place]    Report Status PENDING  Incomplete  Respiratory Panel by RT PCR (Flu A&B, Covid) - Nasopharyngeal Swab     Status: None   Collection Time: 04/20/19  3:53 PM   Specimen: Nasopharyngeal Swab  Result Value Ref Range Status   SARS Coronavirus 2 by RT PCR NEGATIVE NEGATIVE Final    Comment: (NOTE) SARS-CoV-2 target nucleic acids are NOT DETECTED. The SARS-CoV-2 RNA is generally detectable in upper respiratoy specimens during the acute phase of infection. The lowest concentration of SARS-CoV-2 viral copies this assay can detect is 131 copies/mL. A negative result does not preclude SARS-Cov-2 infection and should not be used as the sole basis for treatment or other patient management decisions. A negative result may occur with  improper specimen collection/handling, submission of specimen other than nasopharyngeal swab, presence of viral mutation(s) within the areas targeted by this assay, and inadequate number of viral copies (<131 copies/mL). A negative result must be combined with clinical observations, patient history, and epidemiological information. The expected result is Negative. Fact Sheet for Patients:  06/18/19 Fact Sheet for Healthcare Providers:  https://www.moore.com/ This test is not yet ap proved or cleared by the https://www.young.biz/ FDA and  has been authorized for detection and/or diagnosis of SARS-CoV-2 by FDA under an Emergency Use Authorization (EUA). This EUA will remain  in effect (meaning this test can be used) for the duration of the COVID-19 declaration under Section 564(b)(1) of the Act, 21 U.S.C. section 360bbb-3(b)(1), unless the authorization is terminated or revoked sooner.    Influenza A by PCR NEGATIVE NEGATIVE Final   Influenza B by PCR NEGATIVE NEGATIVE Final    Comment: (NOTE) The Xpert Xpress SARS-CoV-2/FLU/RSV assay is intended as an aid in  the  diagnosis of influenza from Nasopharyngeal swab specimens and  should not be used as a sole basis for treatment. Nasal washings and  aspirates are unacceptable for Xpert Xpress SARS-CoV-2/FLU/RSV  testing. Fact Sheet for Patients: [place] Fact Sheet for Healthcare Providers: https://www.moore.com/ This test is not yet approved or cleared by the https://www.young.biz/ FDA and  has been authorized for detection and/or diagnosis of  SARS-CoV-2 by  FDA under an Emergency Use Authorization (EUA). This EUA will remain  in effect (meaning this test can be used) for the duration of the  Covid-19 declaration under Section 564(b)(1) of the Act, 21  U.S.C. section 360bbb-3(b)(1), unless the authorization is  terminated or revoked. Performed at [facility], [place] [address] [place], [place] [address]       Radiology Studies: DG CHEST PORT 1 VIEW  Result Date: 04/23/2019 CLINICAL DATA:  Shortness of breath evaluate for pulmonary edema EXAM: PORTABLE CHEST 1 VIEW COMPARISON:  04/20/2019 FINDINGS: Cardiomediastinal contours are stable, accentuated by low lung volumes on today's study and projectional, lordotic type positioning. There is silhouetting of the left hemidiaphragm. Perihilar opacities have developed on the right greater than left since the previous exam. No signs of blunting of costodiaphragmatic sulci. No acute bone finding. IMPRESSION: 1. Low lung volumes with silhouetting of the left hemidiaphragm potentially related to developing volume loss or consolidation in the left lung base. 2. Perihilar opacities have developed on the right greater than left. Findings could reflect asymmetric edema or atypical pneumonia. 3. Stable cardiomediastinal contours accounting for projection and low lung volume. Electronically Signed   By: Zetta Bills M.D.   On: 04/23/2019 08:49     Scheduled Meds: . allopurinol  100 mg Oral Daily  . heparin  5,000 Units  Subcutaneous Q8H  . HYDROmorphone   Intravenous Q4H  . insulin aspart  0-9 Units Subcutaneous Q4H  . insulin glargine  15 Units Subcutaneous Daily   Continuous Infusions: . sodium chloride Stopped (04/21/19 1549)  . dextrose 5 % and 0.45% NaCl 75 mL/hr at 04/22/19 0437  . insulin Stopped (04/22/19 1238)  . lactated ringers 125 mL/hr at 04/23/19 0825     LOS: 3 days   Time Spent in minutes   45 minutes  Donie Moulton D.O. on 04/23/2019 at 10:44 AM  Between 7am to 7pm - Please see pager noted on [email or web address]  After 7pm go to [email or web address]  And look for the night coverage person covering for me after hours  [facility] Office  [phone]

## 2019-04-23 NOTE — Progress Notes (Signed)
Inpatient Diabetes Program Recommendations  AACE/ADA: New Consensus Statement on Inpatient Glycemic Control (2015)  Target Ranges:  Prepandial:   less than 140 mg/dL      Peak postprandial:   less than 180 mg/dL (1-2 hours)      Critically ill patients:  140 - 180 mg/dL   Lab Results  Component Value Date   GLUCAP 211 (H) 04/23/2019   HGBA1C 13.2 (H) 04/22/2019    Review of Glycemic Control Results for LORIN, GAWRON (MRN [id number]) as of 04/23/2019 10:40  Ref. Range 04/23/2019 00:05 04/23/2019 03:35 04/23/2019 07:38  Glucose-Capillary Latest Ref Range: 70 - 99 mg/dL 188 (H) 181 (H) 211 (H)   Diabetes history: Dm 2 Outpatient Diabetes medications: Basaglar 15 units one day, 10 units next day, 5 units the next day then regimen starts over. Trulicity 1.5 mg Saturday, Metformin 2000 mg Daily  Current orders for Inpatient glycemic control:  Lantus 15 units Daily, Novolog 0-9 units Q4H  Inpatient Diabetes Program Recommendations:    Consider increasing Lantus to 18 units QD.    Note pt on Trulicity at home. Pt reports being on this for a long time, however one side effect is pancreatitis and GI side effects. Unsure how much this contributed to pt. Recommend Trulicity should not be restarted at time of d/c according to drug information.  Pt will need glucose meter kit at time of d/c. (order # [id number]).  Thanks, Bronson Curb, MSN, RNC-OB Diabetes Coordinator [phone] (8a-5p)

## 2019-04-24 LAB — MAGNESIUM: Magnesium: 1.8 mg/dL (ref 1.7–2.4)

## 2019-04-24 LAB — COMPREHENSIVE METABOLIC PANEL
ALT: 30 U/L (ref 0–44)
AST: 29 U/L (ref 15–41)
Albumin: 2.2 g/dL — ABNORMAL LOW (ref 3.5–5.0)
Alkaline Phosphatase: 56 U/L (ref 38–126)
Anion gap: 13 (ref 5–15)
BUN: 11 mg/dL (ref 6–20)
CO2: 23 mmol/L (ref 22–32)
Calcium: 7.1 mg/dL — ABNORMAL LOW (ref 8.9–10.3)
Chloride: 101 mmol/L (ref 98–111)
Creatinine, Ser: 0.69 mg/dL (ref 0.61–1.24)
GFR calc Af Amer: >60 mL/min (ref >60)
GFR calc non Af Amer: >60 mL/min (ref >60)
Glucose, Bld: 194 mg/dL — ABNORMAL HIGH (ref 70–99)
Potassium: 3.3 mmol/L — ABNORMAL LOW (ref 3.5–5.1)
Sodium: 137 mmol/L (ref 135–145)
Total Bilirubin: 0.9 mg/dL (ref 0.3–1.2)
Total Protein: 5.7 g/dL — ABNORMAL LOW (ref 6.5–8.1)

## 2019-04-24 LAB — CBC
HCT: 34.6 % — ABNORMAL LOW (ref 39.0–52.0)
Hemoglobin: 10.9 g/dL — ABNORMAL LOW (ref 13.0–17.0)
MCH: 25.2 pg — ABNORMAL LOW (ref 26.0–34.0)
MCHC: 31.5 g/dL (ref 30.0–36.0)
MCV: 79.9 fL — ABNORMAL LOW (ref 80.0–100.0)
Platelets: 194 10*3/uL (ref 150–400)
RBC: 4.33 MIL/uL (ref 4.22–5.81)
RDW: 14.8 % (ref 11.5–15.5)
WBC: 6.3 10*3/uL (ref 4.0–10.5)
nRBC: 0.3 % — ABNORMAL HIGH (ref 0.0–0.2)

## 2019-04-24 LAB — GLUCOSE, CAPILLARY
Glucose-Capillary: 154 mg/dL — ABNORMAL HIGH (ref 70–99)
Glucose-Capillary: 161 mg/dL — ABNORMAL HIGH (ref 70–99)
Glucose-Capillary: 172 mg/dL — ABNORMAL HIGH (ref 70–99)
Glucose-Capillary: 191 mg/dL — ABNORMAL HIGH (ref 70–99)
Glucose-Capillary: 198 mg/dL — ABNORMAL HIGH (ref 70–99)
Glucose-Capillary: 200 mg/dL — ABNORMAL HIGH (ref 70–99)

## 2019-04-24 MED ORDER — FUROSEMIDE 10 MG/ML IJ SOLN
40.0000 mg | Freq: Once | INTRAMUSCULAR | Status: AC
  Administered 2019-04-24: 40 mg via INTRAVENOUS
  Filled 2019-04-24: qty 4

## 2019-04-24 MED ORDER — POTASSIUM CHLORIDE CRYS ER 20 MEQ PO TBCR
40.0000 meq | EXTENDED_RELEASE_TABLET | Freq: Once | ORAL | Status: AC
  Administered 2019-04-24: 40 meq via ORAL
  Filled 2019-04-24: qty 2

## 2019-04-24 NOTE — Progress Notes (Signed)
Subjective: Abdominal pain with clear liquids.  Objective: Vital signs in last 24 hours: Temp:  [97.5 F (36.4 C)-100.1 F (37.8 C)] 99.3 F (37.4 C) (01/08 1126) Pulse Rate:  [98-110] 98 (01/08 1126) Resp:  [18-26] 19 (01/08 1126) BP: (120-151)/(69-87) 149/87 (01/08 1126) SpO2:  [93 %-100 %] 100 % (01/08 1126) Last BM Date: 04/21/19  Intake/Output from previous day: 01/07 0701 - 01/08 0700 In: 720 [P.O.:720] Out: 1950 [Urine:1950] Intake/Output this shift: No intake/output data recorded.  General appearance: alert and no distress Resp: some fine crackles in the right lung base. Cardio: regular rate and rhythm GI: tender in the upper abdomen Extremities: extremities normal, atraumatic, no cyanosis or edema  Lab Results: Recent Labs    04/22/19 0729 04/23/19 0450 04/24/19 0412  WBC 8.1 7.5 6.3  HGB 13.0 11.9* 10.9*  HCT 39.0 36.9* 34.6*  PLT 143* 166 194   BMET Recent Labs    04/22/19 0729 04/23/19 0450 04/24/19 0412  NA 136 137 137  K 3.3* 3.7 3.3*  CL 106 105 101  CO2 25 18* 23  GLUCOSE 175* 203* 194*  BUN 11 10 11   CREATININE 0.76 0.79 0.69  CALCIUM 6.9* 7.0* 7.1*   LFT Recent Labs    04/24/19 0412  PROT 5.7*  ALBUMIN 2.2*  AST 29  ALT 30  ALKPHOS 56  BILITOT 0.9   PT/INR No results for input(s): LABPROT, INR in the last 72 hours. Hepatitis Panel No results for input(s): HEPBSAG, HCVAB, HEPAIGM, HEPBIGM in the last 72 hours. C-Diff No results for input(s): CDIFFTOX in the last 72 hours. Fecal Lactopherrin No results for input(s): FECLLACTOFRN in the last 72 hours.  Studies/Results: DG CHEST PORT 1 VIEW  Result Date: 04/23/2019 CLINICAL DATA:  Shortness of breath evaluate for pulmonary edema EXAM: PORTABLE CHEST 1 VIEW COMPARISON:  04/20/2019 FINDINGS: Cardiomediastinal contours are stable, accentuated by low lung volumes on today's study and projectional, lordotic type positioning. There is silhouetting of the left hemidiaphragm. Perihilar  opacities have developed on the right greater than left since the previous exam. No signs of blunting of costodiaphragmatic sulci. No acute bone finding. IMPRESSION: 1. Low lung volumes with silhouetting of the left hemidiaphragm potentially related to developing volume loss or consolidation in the left lung base. 2. Perihilar opacities have developed on the right greater than left. Findings could reflect asymmetric edema or atypical pneumonia. 3. Stable cardiomediastinal contours accounting for projection and low lung volume. Electronically Signed   By: 06/18/2019 M.D.   On: 04/23/2019 08:49    Medications:  Scheduled: . allopurinol  100 mg Oral Daily  . heparin  5,000 Units Subcutaneous Q8H  . HYDROmorphone   Intravenous Q4H  . insulin aspart  0-9 Units Subcutaneous Q4H  . insulin glargine  18 Units Subcutaneous Daily   Continuous:   Assessment/Plan: 1) Acute pancreatitis. 2) DM. 3) Fluid overload.   The patient's breathing is better with lasix.  He received another dosing today by Dr. 06/21/2019 at 40 mg.  There is still some crackles in the right lung base.  His blood work values are now back to baseline.  With the fluid overload he was discontinued from IV fluids and he was provided with clear liquids, but he has pain with PO intake.  At this time it does not appear that he is ready for PO intake.  Plan: 1) NPO. 2) Agree with another dosing of lasix. 3) Follow HCT/BUN/Cr as he is NPO and receiving any IV fluids. 4)  Continue with Dilaudid PCA. 5) Advance diet only when the patient feels that he is ready for the transition.  LOS: 4 days   Jennifr Gaeta D 04/24/2019, 12:23 PM

## 2019-04-24 NOTE — Progress Notes (Signed)
PROGRESS NOTE    Caleb Chambers  IWP:[id number] DOB: Apr 02, 1966 DOA: 04/20/2019 PCP: Patient, No Pcp Per   Brief Narrative:  HPI On 04/20/2019 by Dr. Mikey College Caleb Chambers is a 54 y.o. male with medical history significant of IDDM, HTN, abdominal pain and vomiting. Started 2-3 days ago after heavy meal. Pain is diffuse across his upper abdomen and severe, cramping like. He has not taken anything specifically for it. He has had 20-30 episodes of vomiting and loose diarrhea as well. No fevers or cough. He stopped taking all his meds, including insulin and the Metformin since yesterday.  Interim history Admitted with DKA and acute pancreatitis.  Gastroenterology also consulted.  Patient was on an insulin drip however has now been transitioned off.  Her diet is improving.  Patient now with shortness of breath, suspect volume overload.  Chest x-ray obtained showing volume overload, no history of heart failure.  Assessment & Plan   Acute pancreatitis/mildly elevated LFTs with abdominal pain, nausea, vomiting -Lipase 1319 on admission, however improved down to 63 -Patient was placed on IV fluids, currently n.p.o.- will discuss with GI if patient can be placed on clear fluids -Has had a history of cholecystectomy -Gastroenterology consulted and appreciated.  Dr. Alto Denver has recommended a PCA pump for his pain along with continuous aggressive IV fluid hydration.   -IVF discontinued -Continue antiemetics  -Patient feels his pain is better this morning, currently on dilaudid PCA -placed on clear liquids, however patient feels abdominal pain with clears  Dyspnea -Suspect secondary to volume overload -Chest x-ray does show low volumes, and volume overload -Discontinued IVF  -will give additional dose of lasix -No history of CHF -BNP 99.2  DKA, anion gap metabolic acidosis/ Diabetes mellitus, type II -Suspect secondary to the above process -Presented with with anion gap of 26, glucose of  870 -Has now improved and closed, bicarb is also resolved and up to 25 today. -Will transition off of insulin drip-started Lantus and insulin sliding scale every 4 hours -Diabetes coordinator consulted and appreciated -Hemoglobin A1c 13.2 -Of note patient is on Trulicity at home which may cause pancreatitis.  Should be discontinued upon discharge.  Sepsis  -present on admission -unknown source, possibly secondary to the above -Leukocytosis and lactic acidosis have resolved -Patient continues to be tachycardic and tachypneic which may be secondary to pain- improving   Essential hypertension -BP currently stable -Lisinopril held  Acute kidney injury -Creatinine on admission was 1.77, slight worsening to 1.81 -However this morning is improved to 0.69 -Continue to monitor BMP  Hyponatremia -Sodium is 129 on admission, suspect secondary to DKA -Has since resolved, and improved to 137  DVT Prophylaxis Heparin  Code Status: Full  Family Communication: None at bedside  Disposition Plan: Admitted.  Pending improvement in abdominal pain/pancreatitis as well as GI recommendations, as well respiratory status.  Suspect home when stable  Consultants Gastroenterology  Procedures  None  Antibiotics   Anti-infectives (From admission, onward)   None      Subjective:   Caleb Chambers seen and examined today.  These have abdominal pain, and feels that whenever he drinks goes straight through him.  Continues to have some nausea, however no vomiting.  Denies current chest pain, dizziness or headache.  Feels shortness of breath is mildly improved. Objective:   Vitals:   04/24/19 0405 04/24/19 0425 04/24/19 0726 04/24/19 0814  BP: (!) 151/83  120/75   Pulse: (!) 101  100   Resp: (!) 22 20 18  20  Temp: 98.2 F (36.8 C)  98.4 F (36.9 C)   TempSrc: Oral  Oral   SpO2: 98% 98% 99% 94%  Weight:      Height:        Intake/Output Summary (Last 24 hours) at 04/24/2019 1120 Last data  filed at 04/24/2019 0500 Gross per 24 hour  Intake 720 ml  Output 1050 ml  Net -330 ml   Filed Weights   04/21/19 1538  Weight: (!) 145.2 kg   Exam  General: Well developed, well nourished, NAD  HEENT: NCAT, mucous membranes moist.   Cardiovascular: S1 S2 auscultated, RRR  Respiratory: Diminished breath sounds, no wheezing   Abdomen: Soft, obese, upper abdominal TTP, nondistended, + bowel sounds  Extremities: warm dry without cyanosis clubbing or edema  Neuro: AAOx3, nonfocal  Psych: Appropriate mood and affect, pleasant  Data Reviewed: I have personally reviewed following labs and imaging studies  CBC: Recent Labs  Lab 04/21/19 0630 04/21/19 1416 04/22/19 0729 04/23/19 0450 04/24/19 0412  WBC 12.6* 11.1* 8.1 7.5 6.3  HGB 15.7 15.1 13.0 11.9* 10.9*  HCT 49.5 46.2 39.0 36.9* 34.6*  MCV 81.4 77.5* 77.8* 79.5* 79.9*  PLT 175 191 143* 166 540   Basic Metabolic Panel: Recent Labs  Lab 04/20/19 1708 04/21/19 0058 04/21/19 0630 04/22/19 0729 04/23/19 0450 04/24/19 0412  NA  --  135 136 136 137 137  K  --  4.2 4.8 3.3* 3.7 3.3*  CL  --  103 106 106 105 101  CO2  --  20* 18* 25 18* 23  GLUCOSE  --  219* 198* 175* 203* 194*  BUN  --  34* 28* 11 10 11   CREATININE  --  1.28* 1.07 0.76 0.79 0.69  CALCIUM  --  8.0* 7.6* 6.9* 7.0* 7.1*  MG 2.2  --   --   --   --  1.8  PHOS 3.5  --   --   --   --   --    GFR: Estimated Creatinine Clearance: 157.8 mL/min (by C-G formula based on SCr of 0.69 mg/dL). Liver Function Tests: Recent Labs  Lab 04/20/19 0923 04/21/19 0630 04/23/19 0450 04/24/19 0412  AST 51* 89* 38 29  ALT 45* 40 27 30  ALKPHOS 93 61 55 56  BILITOT 1.4* 1.3* 1.2 0.9  PROT 7.9 6.3* 5.8* 5.7*  ALBUMIN 4.0 2.9* 2.3* 2.2*   Recent Labs  Lab 04/20/19 0923 04/21/19 0630 04/21/19 1416 04/22/19 0729 04/23/19 0450  LIPASE 1,319* 684* 554* 148* 63*   No results for input(s): AMMONIA in the last 168 hours. Coagulation Profile: No results for  input(s): INR, PROTIME in the last 168 hours. Cardiac Enzymes: No results for input(s): CKTOTAL, CKMB, CKMBINDEX, TROPONINI in the last 168 hours. BNP (last 3 results) No results for input(s): PROBNP in the last 8760 hours. HbA1C: Recent Labs    04/22/19 0243  HGBA1C 13.2*   CBG: Recent Labs  Lab 04/23/19 1943 04/23/19 2000 04/24/19 0039 04/24/19 0400 04/24/19 0729  GLUCAP 176* 174* 198* 191* 161*   Lipid Profile: No results for input(s): CHOL, HDL, LDLCALC, TRIG, CHOLHDL, LDLDIRECT in the last 72 hours. Thyroid Function Tests: No results for input(s): TSH, T4TOTAL, FREET4, T3FREE, THYROIDAB in the last 72 hours. Anemia Panel: No results for input(s): VITAMINB12, FOLATE, FERRITIN, TIBC, IRON, RETICCTPCT in the last 72 hours. Urine analysis:    Component Value Date/Time   COLORURINE YELLOW 04/20/2019 1708   APPEARANCEUR CLEAR 04/20/2019 1708  LABSPEC 1.022 04/20/2019 1708   PHURINE 5.0 04/20/2019 1708   GLUCOSEU >=500 (A) 04/20/2019 1708   HGBUR SMALL (A) 04/20/2019 1708   BILIRUBINUR NEGATIVE 04/20/2019 1708   KETONESUR 5 (A) 04/20/2019 1708   PROTEINUR NEGATIVE 04/20/2019 1708   UROBILINOGEN 0.2 09/12/2018 0949   NITRITE NEGATIVE 04/20/2019 1708   LEUKOCYTESUR NEGATIVE 04/20/2019 1708   Sepsis Labs: @LABRCNTIP (procalcitonin:4,lacticidven:4)  ) Recent Results (from the past 240 hour(s))  Culture, blood (routine x 2)     Status: None (Preliminary result)   Collection Time: 04/20/19 12:37 PM   Specimen: BLOOD  Result Value Ref Range Status   Specimen Description BLOOD LEFT ANTECUBITAL  Final   Special Requests   Final    BOTTLES DRAWN AEROBIC AND ANAEROBIC Blood Culture results may not be optimal due to an inadequate volume of blood received in culture bottles   Culture   Final    NO GROWTH 4 DAYS Performed at [facility], [address] [place], [place]    Report Status PENDING  Incomplete  Respiratory Panel by RT PCR (Flu A&B, Covid) -  Nasopharyngeal Swab     Status: None   Collection Time: 04/20/19  3:53 PM   Specimen: Nasopharyngeal Swab  Result Value Ref Range Status   SARS Coronavirus 2 by RT PCR NEGATIVE NEGATIVE Final    Comment: (NOTE) SARS-CoV-2 target nucleic acids are NOT DETECTED. The SARS-CoV-2 RNA is generally detectable in upper respiratoy specimens during the acute phase of infection. The lowest concentration of SARS-CoV-2 viral copies this assay can detect is 131 copies/mL. A negative result does not preclude SARS-Cov-2 infection and should not be used as the sole basis for treatment or other patient management decisions. A negative result may occur with  improper specimen collection/handling, submission of specimen other than nasopharyngeal swab, presence of viral mutation(s) within the areas targeted by this assay, and inadequate number of viral copies (<131 copies/mL). A negative result must be combined with clinical observations, patient history, and epidemiological information. The expected result is Negative. Fact Sheet for Patients:  06/18/19 Fact Sheet for Healthcare Providers:  https://www.moore.com/ This test is not yet ap proved or cleared by the https://www.young.biz/ FDA and  has been authorized for detection and/or diagnosis of SARS-CoV-2 by FDA under an Emergency Use Authorization (EUA). This EUA will remain  in effect (meaning this test can be used) for the duration of the COVID-19 declaration under Section 564(b)(1) of the Act, 21 U.S.C. section 360bbb-3(b)(1), unless the authorization is terminated or revoked sooner.    Influenza A by PCR NEGATIVE NEGATIVE Final   Influenza B by PCR NEGATIVE NEGATIVE Final    Comment: (NOTE) The Xpert Xpress SARS-CoV-2/FLU/RSV assay is intended as an aid in  the diagnosis of influenza from Nasopharyngeal swab specimens and  should not be used as a sole basis for treatment. Nasal washings and  aspirates  are unacceptable for Xpert Xpress SARS-CoV-2/FLU/RSV  testing. Fact Sheet for Patients: [place] Fact Sheet for Healthcare Providers: https://www.moore.com/ This test is not yet approved or cleared by the https://www.young.biz/ FDA and  has been authorized for detection and/or diagnosis of SARS-CoV-2 by  FDA under an Emergency Use Authorization (EUA). This EUA will remain  in effect (meaning this test can be used) for the duration of the  Covid-19 declaration under Section 564(b)(1) of the Act, 21  U.S.C. section 360bbb-3(b)(1), unless the authorization is  terminated or revoked. Performed at [facility], [address]  [place], [place] [address]       Radiology Studies: DG CHEST PORT 1 VIEW  Result Date: 04/23/2019 CLINICAL DATA:  Shortness of breath evaluate for pulmonary edema EXAM: PORTABLE CHEST 1 VIEW COMPARISON:  04/20/2019 FINDINGS: Cardiomediastinal contours are stable, accentuated by low lung volumes on today's study and projectional, lordotic type positioning. There is silhouetting of the left hemidiaphragm. Perihilar opacities have developed on the right greater than left since the previous exam. No signs of blunting of costodiaphragmatic sulci. No acute bone finding. IMPRESSION: 1. Low lung volumes with silhouetting of the left hemidiaphragm potentially related to developing volume loss or consolidation in the left lung base. 2. Perihilar opacities have developed on the right greater than left. Findings could reflect asymmetric edema or atypical pneumonia. 3. Stable cardiomediastinal contours accounting for projection and low lung volume. Electronically Signed   By: Donzetta Kohut M.D.   On: 04/23/2019 08:49     Scheduled Meds: . allopurinol  100 mg Oral Daily  . heparin  5,000 Units Subcutaneous Q8H  . HYDROmorphone   Intravenous Q4H  . insulin aspart  0-9 Units Subcutaneous Q4H  . insulin glargine  18 Units Subcutaneous  Daily   Continuous Infusions:    LOS: 4 days   Time Spent in minutes   45 minutes  Joven Mom D.O. on 04/24/2019 at 11:20 AM  Between 7am to 7pm - Please see pager noted on [email or web address]  After 7pm go to [email or web address]  And look for the night coverage person covering for me after hours  [facility] Office  [phone]

## 2019-04-25 DIAGNOSIS — R1013 Epigastric pain: Secondary | ICD-10-CM

## 2019-04-25 DIAGNOSIS — R935 Abnormal findings on diagnostic imaging of other abdominal regions, including retroperitoneum: Secondary | ICD-10-CM

## 2019-04-25 DIAGNOSIS — K851 Biliary acute pancreatitis without necrosis or infection: Secondary | ICD-10-CM

## 2019-04-25 LAB — COMPREHENSIVE METABOLIC PANEL
ALT: 30 U/L (ref 0–44)
AST: 25 U/L (ref 15–41)
Albumin: 2.1 g/dL — ABNORMAL LOW (ref 3.5–5.0)
Alkaline Phosphatase: 60 U/L (ref 38–126)
Anion gap: 14 (ref 5–15)
BUN: 7 mg/dL (ref 6–20)
CO2: 23 mmol/L (ref 22–32)
Calcium: 7.5 mg/dL — ABNORMAL LOW (ref 8.9–10.3)
Chloride: 100 mmol/L (ref 98–111)
Creatinine, Ser: 0.71 mg/dL (ref 0.61–1.24)
GFR calc Af Amer: >60 mL/min (ref >60)
GFR calc non Af Amer: >60 mL/min (ref >60)
Glucose, Bld: 183 mg/dL — ABNORMAL HIGH (ref 70–99)
Potassium: 3 mmol/L — ABNORMAL LOW (ref 3.5–5.1)
Sodium: 137 mmol/L (ref 135–145)
Total Bilirubin: 0.9 mg/dL (ref 0.3–1.2)
Total Protein: 5.5 g/dL — ABNORMAL LOW (ref 6.5–8.1)

## 2019-04-25 LAB — GLUCOSE, CAPILLARY
Glucose-Capillary: 168 mg/dL — ABNORMAL HIGH (ref 70–99)
Glucose-Capillary: 191 mg/dL — ABNORMAL HIGH (ref 70–99)
Glucose-Capillary: 165 mg/dL — ABNORMAL HIGH (ref 70–99)
Glucose-Capillary: 170 mg/dL — ABNORMAL HIGH (ref 70–99)
Glucose-Capillary: 161 mg/dL — ABNORMAL HIGH (ref 70–99)
Glucose-Capillary: 159 mg/dL — ABNORMAL HIGH (ref 70–99)
Glucose-Capillary: 177 mg/dL — ABNORMAL HIGH (ref 70–99)

## 2019-04-25 LAB — CBC
HCT: 35.1 % — ABNORMAL LOW (ref 39.0–52.0)
Hemoglobin: 11.3 g/dL — ABNORMAL LOW (ref 13.0–17.0)
MCH: 25.4 pg — ABNORMAL LOW (ref 26.0–34.0)
MCHC: 32.2 g/dL (ref 30.0–36.0)
MCV: 78.9 fL — ABNORMAL LOW (ref 80.0–100.0)
Platelets: 205 10*3/uL (ref 150–400)
RBC: 4.45 MIL/uL (ref 4.22–5.81)
RDW: 14.8 % (ref 11.5–15.5)
WBC: 6.5 10*3/uL (ref 4.0–10.5)
nRBC: 0.6 % — ABNORMAL HIGH (ref 0.0–0.2)

## 2019-04-25 LAB — CULTURE, BLOOD (ROUTINE X 2): Culture: NO GROWTH

## 2019-04-25 MED ORDER — POTASSIUM CHLORIDE 10 MEQ/100ML IV SOLN
10.0000 meq | INTRAVENOUS | Status: AC
  Administered 2019-04-25 (×4): 10 meq via INTRAVENOUS
  Filled 2019-04-25 (×4): qty 100

## 2019-04-25 MED ORDER — POTASSIUM CHLORIDE 10 MEQ/100ML IV SOLN
10.0000 meq | INTRAVENOUS | Status: AC
  Administered 2019-04-25 (×2): 10 meq via INTRAVENOUS
  Filled 2019-04-25: qty 100

## 2019-04-25 NOTE — Progress Notes (Signed)
[facility] COVERING FOR DR. PATRICK HUNG   HISTORY OF PRESENT ILLNESS:  Caleb Chambers is a 54 y.o. male who is known to Dr. Elnoria Howard for Crohn's disease.  Admitted with acute pancreatitis of uncertain etiology.  Patient tells me that he continues to have abdominal pain.  Better than admission.  He describes it as 7 out of 10.  PCA pump helps.  Pain seems to be exacerbated by meals.  He is ambulating.  He looks well.  Laboratories: Blood work looks good.  Liver tests are normal.  Albumin 2.1.  White blood cell count 6.5.  Lipase 2 days ago 63.  BUN 7 and creatinine 0.7  REVIEW OF SYSTEMS:  All non-GI ROS negative except for  Past Medical History:  Diagnosis Date  . Diabetes mellitus without complication (HCC)   . Gout     History reviewed. No pertinent surgical history.  Social History Caleb Chambers  reports that he has never smoked. He has never used smokeless tobacco. He reports previous alcohol use. No history on file for drug.  family history includes Healthy in his mother; Hypertension in his father.  Allergies  Allergen Reactions  . Penicillins Anaphylaxis, Hives and Swelling    Did it involve swelling of the face/tongue/throat, SOB, or low BP? Yes Did it involve sudden or severe rash/hives, skin peeling, or any reaction on the inside of your mouth or nose? Yes Did you need to seek medical attention at a hospital or doctor's office? Yes When did it last happen?teenager If all above answers are "NO", may proceed with cephalosporin use.  . Clindamycin Other (See Comments)    Unknown reaction - reported by [facility] 09-30-13  . Metoprolol Other (See Comments)    Chest pains - [facility] by [facility] and  [facility] 02/08/14       PHYSICAL EXAMINATION: Vital signs: BP 131/73   Pulse (!) 103   Temp 98.4 F (36.9 C) (Oral)   Resp 20   Ht 5\' 11"  (1.803 m)   Wt (!) 145.2 kg   SpO2 99%   BMI 44.63 kg/m   Constitutional: generally well-appearing, no acute  distress Psychiatric: alert and oriented x3, cooperative Eyes: extraocular movements intact, anicteric, conjunctiva pink Mouth: oral pharynx moist, no lesions Neck: supple no lymphadenopathy Cardiovascular: heart regular rate and rhythm, no murmur Lungs: clear to auscultation bilaterally Abdomen: Markedly obese with mild tenderness in the epigastric region, nondistended, no obvious ascites, no peritoneal signs, normal bowel sounds, no organomegaly Rectal: Omitted Extremities: no clubbing or cyanosis.  Trace lower extremity edema bilaterally Skin: no lesions on visible extremities Neuro: No focal deficits.  Cranial nerves intact  ASSESSMENT:  1.  Acute pancreatitis of uncertain etiology.  No worrisome features.  Not toxic.  The main issue now is postprandial pain 2.  History of Crohn's.  Quiesced sent on Humira 3.  Diabetes and obesity   PLAN:  1.  Advance diet as tolerated 2.  Encouraged to ambulate regularly 3.  Will be ready for discharge when his p.o. intake is good and pain level manageable.  . Wilhemina Bonito., M.D. [facility] Division of Gastroenterology

## 2019-04-25 NOTE — Progress Notes (Signed)
PROGRESS NOTE    Caleb Chambers  NWG:[id number] DOB: 1966/01/07 DOA: 04/20/2019 PCP: Patient, No Pcp Per   Brief Narrative:  HPI On 04/20/2019 by Dr. Wynetta Fines Caleb Chambers is a 54 y.o. male with medical history significant of IDDM, HTN, abdominal pain and vomiting. Started 2-3 days ago after heavy meal. Pain is diffuse across his upper abdomen and severe, cramping like. He has not taken anything specifically for it. He has had 20-30 episodes of vomiting and loose diarrhea as well. No fevers or cough. He stopped taking all his meds, including insulin and the Metformin since yesterday.  Interim history Admitted with DKA and acute pancreatitis.  Gastroenterology also consulted.  Patient was on an insulin drip however has now been transitioned off.  Her diet is improving.  Patient now with shortness of breath, suspect volume overload.  Chest x-ray obtained showing volume overload, no history of heart failure. Given IV lasix. Was placed on clear liquids but continued to have abdominal pain. Would like to try liquids again today. Assessment & Plan   Acute pancreatitis/mildly elevated LFTs with abdominal pain, nausea, vomiting -Lipase 1319 on admission, however improved down to 63 -Patient was placed on IV fluids, currently n.p.o.- will discuss with GI if patient can be placed on clear fluids -Has had a history of cholecystectomy -Gastroenterology consulted and appreciated.  Dr. Benson Norway recommended a PCA pump for his pain along with continuous aggressive IV fluid hydration.   -IVF discontinued due to dyspnea -Continue antiemetics  -Patient feels his pain is better this morning, currently on dilaudid PCA -placed on clear liquids, however patient feels abdominal pain with clears and was made NPO on 1/8. He would like to try again today.  Dyspnea -improving  -Suspect secondary to volume overload -Chest x-ray does show low volumes, and volume overload -Discontinued IVF  -Was given dose of IV  lasix -No history of CHF -BNP 99.2  DKA, anion gap metabolic acidosis/ Diabetes mellitus, type II -Suspect secondary to the above process -Presented with with anion gap of 26, glucose of 870 -Has now improved and closed, bicarb is also resolved and up to 25 today. -Will transition off of insulin drip-started Lantus and insulin sliding scale every 4 hours -Diabetes coordinator consulted and appreciated -Hemoglobin A1c 13.2 -Of note patient is on Trulicity at home which may cause pancreatitis.  Should be discontinued upon discharge.  Sepsis  -present on admission -unknown source, possibly secondary to the above -Leukocytosis and lactic acidosis have resolved -Patient continues to be tachycardic and tachypneic which may be secondary to pain- improving   Essential hypertension -BP currently stable -Lisinopril held  Acute kidney injury -Creatinine on admission was 1.77, slight worsening to 1.81 -However this morning is improved to 0.71 -Continue to monitor BMP  Hyponatremia -Sodium is 129 on admission, suspect secondary to DKA -Has since resolved, and improved to 137  DVT Prophylaxis Heparin  Code Status: Full  Family Communication: None at bedside  Disposition Plan: Admitted.  Pending improvement in abdominal pain/pancreatitis as well as GI recommendations, as well respiratory status.  Suspect home when stable  Consultants Gastroenterology  Procedures  None  Antibiotics   Anti-infectives (From admission, onward)   None      Subjective:   Caleb Chambers seen and examined today.  Abdominal pain is mildly improved.  Denies any nausea or vomiting at this time.  Denies chest pain, shortness of breath, diarrhea constipation, dizziness or headache.  Patient does wish to try liquids again, as he  wants to drink cold water.  He also inquires about discharge. Objective:   Vitals:   04/25/19 0411 04/25/19 0414 04/25/19 0731 04/25/19 0858  BP: (!) 153/83  (!) 147/67   Pulse:  (!) 106  (!) 102   Resp: (!) 21 20 18 17   Temp: 98.1 F (36.7 C)  99.2 F (37.3 C)   TempSrc: Oral  Oral   SpO2: 98%  100% 98%  Weight:      Height:        Intake/Output Summary (Last 24 hours) at 04/25/2019 0913 Last data filed at 04/25/2019 0400 Gross per 24 hour  Intake 33.8 ml  Output 3450 ml  Net -3416.2 ml   Filed Weights   04/21/19 1538  Weight: (!) 145.2 kg   Exam  General: Well developed, well nourished, NAD, appears stated age  HEENT: NCAT, mucous membranes moist.   Cardiovascular: S1 S2 auscultated, RRR  Respiratory: Diminished breath sounds, no wheezing  Abdomen: Soft, obese, nontender, nondistended, + bowel sounds  Extremities: warm dry without cyanosis clubbing or edema  Neuro: AAOx3, nonfocal  Skin: Without rashes exudates or nodules  Psych: Appropriate mood and affect, pleasant  Data Reviewed: I have personally reviewed following labs and imaging studies  CBC: Recent Labs  Lab 04/21/19 1416 04/22/19 0729 04/23/19 0450 04/24/19 0412 04/25/19 0359  WBC 11.1* 8.1 7.5 6.3 6.5  HGB 15.1 13.0 11.9* 10.9* 11.3*  HCT 46.2 39.0 36.9* 34.6* 35.1*  MCV 77.5* 77.8* 79.5* 79.9* 78.9*  PLT 191 143* 166 194 205   Basic Metabolic Panel: Recent Labs  Lab 04/20/19 1708 04/21/19 0630 04/22/19 0729 04/23/19 0450 04/24/19 0412 04/25/19 0359  NA  --  136 136 137 137 137  K  --  4.8 3.3* 3.7 3.3* 3.0*  CL  --  106 106 105 101 100  CO2  --  18* 25 18* 23 23  GLUCOSE  --  198* 175* 203* 194* 183*  BUN  --  28* 11 10 11 7   CREATININE  --  1.07 0.76 0.79 0.69 0.71  CALCIUM  --  7.6* 6.9* 7.0* 7.1* 7.5*  MG 2.2  --   --   --  1.8  --   PHOS 3.5  --   --   --   --   --    GFR: Estimated Creatinine Clearance: 157.8 mL/min (by C-G formula based on SCr of 0.71 mg/dL). Liver Function Tests: Recent Labs  Lab 04/20/19 0923 04/21/19 0630 04/23/19 0450 04/24/19 0412 04/25/19 0359  AST 51* 89* 38 29 25  ALT 45* 40 27 30 30   ALKPHOS 93 61 55 56 60    BILITOT 1.4* 1.3* 1.2 0.9 0.9  PROT 7.9 6.3* 5.8* 5.7* 5.5*  ALBUMIN 4.0 2.9* 2.3* 2.2* 2.1*   Recent Labs  Lab 04/20/19 0923 04/21/19 0630 04/21/19 1416 04/22/19 0729 04/23/19 0450  LIPASE 1,319* 684* 554* 148* 63*   No results for input(s): AMMONIA in the last 168 hours. Coagulation Profile: No results for input(s): INR, PROTIME in the last 168 hours. Cardiac Enzymes: No results for input(s): CKTOTAL, CKMB, CKMBINDEX, TROPONINI in the last 168 hours. BNP (last 3 results) No results for input(s): PROBNP in the last 8760 hours. HbA1C: No results for input(s): HGBA1C in the last 72 hours. CBG: Recent Labs  Lab 04/24/19 1518 04/24/19 2021 04/25/19 0002 04/25/19 0417 04/25/19 0728  GLUCAP 154* 200* 168* 191* 165*   Lipid Profile: No results for input(s): CHOL, HDL, LDLCALC, TRIG, CHOLHDL, LDLDIRECT  in the last 72 hours. Thyroid Function Tests: No results for input(s): TSH, T4TOTAL, FREET4, T3FREE, THYROIDAB in the last 72 hours. Anemia Panel: No results for input(s): VITAMINB12, FOLATE, FERRITIN, TIBC, IRON, RETICCTPCT in the last 72 hours. Urine analysis:    Component Value Date/Time   COLORURINE YELLOW 04/20/2019 1708   APPEARANCEUR CLEAR 04/20/2019 1708   LABSPEC 1.022 04/20/2019 1708   PHURINE 5.0 04/20/2019 1708   GLUCOSEU >=500 (A) 04/20/2019 1708   HGBUR SMALL (A) 04/20/2019 1708   BILIRUBINUR NEGATIVE 04/20/2019 1708   KETONESUR 5 (A) 04/20/2019 1708   PROTEINUR NEGATIVE 04/20/2019 1708   UROBILINOGEN 0.2 09/12/2018 0949   NITRITE NEGATIVE 04/20/2019 1708   LEUKOCYTESUR NEGATIVE 04/20/2019 1708   Sepsis Labs: @LABRCNTIP (procalcitonin:4,lacticidven:4)  ) Recent Results (from the past 240 hour(s))  Culture, blood (routine x 2)     Status: None   Collection Time: 04/20/19 12:37 PM   Specimen: BLOOD  Result Value Ref Range Status   Specimen Description BLOOD LEFT ANTECUBITAL  Final   Special Requests   Final    BOTTLES DRAWN AEROBIC AND ANAEROBIC  Blood Culture results may not be optimal due to an inadequate volume of blood received in culture bottles   Culture   Final    NO GROWTH 5 DAYS Performed at [facility], [address], [place], [place]    Report Status 04/25/2019 FINAL  Final  Respiratory Panel by RT PCR (Flu A&B, Covid) - Nasopharyngeal Swab     Status: None   Collection Time: 04/20/19  3:53 PM   Specimen: Nasopharyngeal Swab  Result Value Ref Range Status   SARS Coronavirus 2 by RT PCR NEGATIVE NEGATIVE Final    Comment: (NOTE) SARS-CoV-2 target nucleic acids are NOT DETECTED. The SARS-CoV-2 RNA is generally detectable in upper respiratoy specimens during the acute phase of infection. The lowest concentration of SARS-CoV-2 viral copies this assay can detect is 131 copies/mL. A negative result does not preclude SARS-Cov-2 infection and should not be used as the sole basis for treatment or other patient management decisions. A negative result may occur with  improper specimen collection/handling, submission of specimen other than nasopharyngeal swab, presence of viral mutation(s) within the areas targeted by this assay, and inadequate number of viral copies (<131 copies/mL). A negative result must be combined with clinical observations, patient history, and epidemiological information. The expected result is Negative. Fact Sheet for Patients:  06/18/19 Fact Sheet for Healthcare Providers:  https://www.moore.com/ This test is not yet ap proved or cleared by the https://www.young.biz/ FDA and  has been authorized for detection and/or diagnosis of SARS-CoV-2 by FDA under an Emergency Use Authorization (EUA). This EUA will remain  in effect (meaning this test can be used) for the duration of the COVID-19 declaration under Section 564(b)(1) of the Act, 21 U.S.C. section 360bbb-3(b)(1), unless the authorization is terminated or revoked sooner.    Influenza A  by PCR NEGATIVE NEGATIVE Final   Influenza B by PCR NEGATIVE NEGATIVE Final    Comment: (NOTE) The Xpert Xpress SARS-CoV-2/FLU/RSV assay is intended as an aid in  the diagnosis of influenza from Nasopharyngeal swab specimens and  should not be used as a sole basis for treatment. Nasal washings and  aspirates are unacceptable for Xpert Xpress SARS-CoV-2/FLU/RSV  testing. Fact Sheet for Patients: [place] Fact Sheet for Healthcare Providers: https://www.moore.com/ This test is not yet approved or cleared by the https://www.young.biz/ FDA and  has been authorized for detection and/or diagnosis of  SARS-CoV-2 by  FDA under an Emergency Use Authorization (EUA). This EUA will remain  in effect (meaning this test can be used) for the duration of the  Covid-19 declaration under Section 564(b)(1) of the Act, 21  U.S.C. section 360bbb-3(b)(1), unless the authorization is  terminated or revoked. Performed at [facility], [address], [place], [place] [address]       Radiology Studies: No results found.   Scheduled Meds: . allopurinol  100 mg Oral Daily  . heparin  5,000 Units Subcutaneous Q8H  . HYDROmorphone   Intravenous Q4H  . insulin aspart  0-9 Units Subcutaneous Q4H  . insulin glargine  18 Units Subcutaneous Daily   Continuous Infusions: . potassium chloride 10 mEq (04/25/19 0823)     LOS: 5 days   Time Spent in minutes   45 minutes  Loni Delbridge D.O. on 04/25/2019 at 9:13 AM  Between 7am to 7pm - Please see pager noted on [email or web address]  After 7pm go to [email or web address]  And look for the night coverage person covering for me after hours  [facility] Office  [phone]

## 2019-04-26 DIAGNOSIS — K859 Acute pancreatitis without necrosis or infection, unspecified: Principal | ICD-10-CM

## 2019-04-26 LAB — GLUCOSE, CAPILLARY
Glucose-Capillary: 165 mg/dL — ABNORMAL HIGH (ref 70–99)
Glucose-Capillary: 168 mg/dL — ABNORMAL HIGH (ref 70–99)
Glucose-Capillary: 215 mg/dL — ABNORMAL HIGH (ref 70–99)
Glucose-Capillary: 203 mg/dL — ABNORMAL HIGH (ref 70–99)
Glucose-Capillary: 176 mg/dL — ABNORMAL HIGH (ref 70–99)

## 2019-04-26 LAB — BASIC METABOLIC PANEL
Anion gap: 16 — ABNORMAL HIGH (ref 5–15)
BUN: 8 mg/dL (ref 6–20)
CO2: 18 mmol/L — ABNORMAL LOW (ref 22–32)
Calcium: 7.6 mg/dL — ABNORMAL LOW (ref 8.9–10.3)
Chloride: 101 mmol/L (ref 98–111)
Creatinine, Ser: 0.68 mg/dL (ref 0.61–1.24)
GFR calc Af Amer: >60 mL/min (ref >60)
GFR calc non Af Amer: >60 mL/min (ref >60)
Glucose, Bld: 171 mg/dL — ABNORMAL HIGH (ref 70–99)
Potassium: 3.8 mmol/L (ref 3.5–5.1)
Sodium: 135 mmol/L (ref 135–145)

## 2019-04-26 LAB — CBC
HCT: 35.4 % — ABNORMAL LOW (ref 39.0–52.0)
Hemoglobin: 11.4 g/dL — ABNORMAL LOW (ref 13.0–17.0)
MCH: 25.7 pg — ABNORMAL LOW (ref 26.0–34.0)
MCHC: 32.2 g/dL (ref 30.0–36.0)
MCV: 79.9 fL — ABNORMAL LOW (ref 80.0–100.0)
Platelets: 179 10*3/uL (ref 150–400)
RBC: 4.43 MIL/uL (ref 4.22–5.81)
RDW: 15 % (ref 11.5–15.5)
WBC: 8.9 10*3/uL (ref 4.0–10.5)
nRBC: 0.8 % — ABNORMAL HIGH (ref 0.0–0.2)

## 2019-04-26 LAB — LIPASE, BLOOD: Lipase: 32 U/L (ref 11–51)

## 2019-04-26 MED ORDER — ONDANSETRON HCL 4 MG/2ML IJ SOLN
4.0000 mg | Freq: Three times a day (TID) | INTRAMUSCULAR | Status: DC
  Administered 2019-04-26 – 2019-04-29 (×9): 4 mg via INTRAVENOUS
  Filled 2019-04-26 (×10): qty 2

## 2019-04-26 MED ORDER — HYDROMORPHONE HCL 1 MG/ML IJ SOLN
1.0000 mg | INTRAMUSCULAR | Status: DC | PRN
  Administered 2019-04-26 – 2019-04-28 (×5): 1 mg via INTRAVENOUS
  Filled 2019-04-26 (×7): qty 1

## 2019-04-26 NOTE — Progress Notes (Signed)
PCA pump discontinued and switched to IV Dilaudid. 75ml of dilaudid left in PCA pump. Wasted in [employer].

## 2019-04-26 NOTE — Progress Notes (Signed)
[facility] COVERING FOR DR. HUNG  HISTORY OF PRESENT ILLNESS:  TARVARES LANT is a 54 y.o. male with colitis and Crohn's disease on Humira who was admitted with acute pancreatitis.  Etiology undetermined.  Patient is lying in bed.  No complaints.  However, tells me that his pain is about the same.  Eating some.  No bowel movements with passing gas.  No nausea or vomiting.  He tells me that he is ambulating.  Laboratories: White blood cell count 8.9, hemoglobin 11.4, BUN 8, creatinine 0.68, lipase now normal at 32  REVIEW OF SYSTEMS:  All non-GI ROS negative except for  Past Medical History:  Diagnosis Date  . Diabetes mellitus without complication (HCC)   . Gout     History reviewed. No pertinent surgical history.  Social History YESHAYA VATH  reports that he has never smoked. He has never used smokeless tobacco. He reports previous alcohol use. No history on file for drug.  family history includes Healthy in his mother; Hypertension in his father.  Allergies  Allergen Reactions  . Penicillins Anaphylaxis, Hives and Swelling    Did it involve swelling of the face/tongue/throat, SOB, or low BP? Yes Did it involve sudden or severe rash/hives, skin peeling, or any reaction on the inside of your mouth or nose? Yes Did you need to seek medical attention at a hospital or doctor's office? Yes When did it last happen?teenager If all above answers are "NO", may proceed with cephalosporin use.  . Clindamycin Other (See Comments)    Unknown reaction - reported by [facility] and [facility] 09-30-13  . Metoprolol Other (See Comments)    Chest pains - reported by [facility] and  [facility] 02/08/14       PHYSICAL EXAMINATION: Vital signs: BP (!) 145/70 (BP Location: Right Wrist)   Pulse (!) 102   Temp 97.6 F (36.4 C) (Oral)   Resp 16   Ht 5\' 11"  (1.803 m)   Wt (!) 145.2 kg   SpO2 98%   BMI 44.63 kg/m   Constitutional: generally well-appearing, no acute distress Psychiatric:  alert and oriented x3, cooperative Eyes: extraocular movements intact, anicteric, conjunctiva pink Mouth: oral pharynx moist, no lesions Neck: supple no lymphadenopathy Cardiovascular: heart regular rate and rhythm, no murmur Lungs: clear to auscultation bilaterally Abdomen: soft, obese, mildly tender in the epigastric region, nondistended, no obvious ascites, no peritoneal signs, normal bowel sounds, no organomegaly Rectal: Omitted Extremities: no clubbing, cyanosis, or lower extremity edema bilaterally Skin: no lesions on visible extremities Neuro: No focal deficits.  Cranial nerves intact  ASSESSMENT:  1.  Acute pancreatitis.  Etiology undetermined 2.  History of Crohn's disease.  Question   PLAN:  1.  Advance diet as tolerated 2.  Continue ambulating 3.  Dr. to resume GI care tomorrow  Elnoria Howard. Wilhemina Bonito., M.D. [facility] Division of Gastroenterology

## 2019-04-26 NOTE — Progress Notes (Signed)
PROGRESS NOTE    Caleb Chambers  ZSW:[id number] DOB: [birth year] DOA: 04/20/2019 PCP: Patient, No Pcp Per   Brief Narrative:  HPI On 04/20/2019 by Dr. Wynetta Fines Caleb Chambers is a 54 y.o. male with medical history significant of IDDM, HTN, abdominal pain and vomiting. Started 2-3 days ago after heavy meal. Pain is diffuse across his upper abdomen and severe, cramping like. He has not taken anything specifically for it. He has had 20-30 episodes of vomiting and loose diarrhea as well. No fevers or cough. He stopped taking all his meds, including insulin and the Metformin since yesterday.  Interim history Admitted with DKA and acute pancreatitis.  Gastroenterology also consulted.  Patient was on an insulin drip however has now been transitioned off.  Her diet is improving.  Patient now with shortness of breath, suspect volume overload.  Chest x-ray obtained showing volume overload, no history of heart failure. Given IV lasix. Was placed on clear liquids but continued to have abdominal pain.  Assessment & Plan   Acute pancreatitis/mildly elevated LFTs with abdominal pain, nausea, vomiting -Progressing very slowly -Lipase 1319 on admission, however improved down to 63 -Patient was placed on IV fluids, currently n.p.o.- will discuss with GI if patient can be placed on clear fluids -Has had a history of cholecystectomy -Gastroenterology consulted and appreciated.  Dr. Benson Norway recommended a PCA pump for his pain along with continuous aggressive IV fluid hydration.   -IVF discontinued due to dyspnea -Continue antiemetics  -Patient feels his pain is better this morning, currently on dilaudid PCA-transition off of PCA pump and placed on IV as needed -Able to tolerate some liquids. -Will schedule Zofran 3 times daily to see if patient is able to tolerate more oral intake  Dyspnea -Improving -Suspect secondary to volume overload -Chest x-ray does show low volumes, and volume  overload -Discontinued IVF  -Was given dose of IV lasix -No history of CHF -BNP 99.2  DKA, anion gap metabolic acidosis/ Diabetes mellitus, type II -Suspect secondary to the above process -Presented with with anion gap of 26, glucose of 870 -Has now improved and closed, bicarb is also resolved and up to 25 today. -Will transition off of insulin drip-started Lantus and insulin sliding scale every 4 hours -Diabetes coordinator consulted and appreciated -Hemoglobin A1c 13.2 -Of note patient is on Trulicity at home which may cause pancreatitis.  Should be discontinued upon discharge.  Sepsis  -present on admission -unknown source, possibly secondary to the above -Leukocytosis and lactic acidosis have resolved -Patient continues to be tachycardic and tachypneic which may be secondary to pain- improving   Essential hypertension -BP currently stable -Lisinopril held  Acute kidney injury -Creatinine on admission was 1.77, slight worsening to 1.81 -However this morning is improved to 0.68 -Continue to monitor BMP  Hyponatremia -Sodium is 129 on admission, suspect secondary to DKA -Has since resolved, and improved to 135  DVT Prophylaxis Heparin  Code Status: Full  Family Communication: None at bedside  Disposition Plan: Admitted.  Pending improvement in abdominal pain/pancreatitis as well as GI recommendations, as well respiratory status.  Suspect home when stable  Consultants Gastroenterology  Procedures  None  Antibiotics   Anti-infectives (From admission, onward)   None      Subjective:   Caleb Chambers seen and examined today.  Patient continues to have abdominal pain but states it is mildly improved.  Was able to tolerate some liquids but not Jell-O yesterday.  Denies current chest pain or shortness of breath.  Complains of occasional nausea.  Denies constipation or diarrhea, dizziness or headache. Objective:   Vitals:   04/26/19 0344 04/26/19 0350 04/26/19 0723  04/26/19 0814  BP: 137/81  130/63   Pulse: (!) 106  (!) 102   Resp: 16 16 16 19   Temp: 98.4 F (36.9 C)  99.2 F (37.3 C)   TempSrc: Oral  Oral   SpO2: 100% 96% 100% 100%  Weight:      Height:        Intake/Output Summary (Last 24 hours) at 04/26/2019 0955 Last data filed at 04/26/2019 0230 Gross per 24 hour  Intake 590 ml  Output 1650 ml  Net -1060 ml   Filed Weights   04/21/19 1538  Weight: (!) 145.2 kg   Exam  General: Well developed, well nourished, NAD, appears stated age  HEENT: NCAT,  mucous membranes moist.  Cardiovascular: S1 S2 auscultated, RRR  Respiratory: Diminished breath sounds however clear, no wheezing  Abdomen: Soft, obese, mildly TTP, nondistended, + bowel sounds  Extremities: warm dry without cyanosis clubbing or edema  Neuro: AAOx3, nonfocal  Psych: Appropriate mood and affect  Data Reviewed: I have personally reviewed following labs and imaging studies  CBC: Recent Labs  Lab 04/22/19 0729 04/23/19 0450 04/24/19 0412 04/25/19 0359 04/26/19 0140  WBC 8.1 7.5 6.3 6.5 8.9  HGB 13.0 11.9* 10.9* 11.3* 11.4*  HCT 39.0 36.9* 34.6* 35.1* 35.4*  MCV 77.8* 79.5* 79.9* 78.9* 79.9*  PLT 143* 166 194 205 179   Basic Metabolic Panel: Recent Labs  Lab 04/20/19 1708 04/22/19 0729 04/23/19 0450 04/24/19 0412 04/25/19 0359 04/26/19 0140  NA  --  136 137 137 137 135  K  --  3.3* 3.7 3.3* 3.0* 3.8  CL  --  106 105 101 100 101  CO2  --  25 18* 23 23 18*  GLUCOSE  --  175* 203* 194* 183* 171*  BUN  --  11 10 11 7 8   CREATININE  --  0.76 0.79 0.69 0.71 0.68  CALCIUM  --  6.9* 7.0* 7.1* 7.5* 7.6*  MG 2.2  --   --  1.8  --   --   PHOS 3.5  --   --   --   --   --    GFR: Estimated Creatinine Clearance: 157.8 mL/min (by C-G formula based on SCr of 0.68 mg/dL). Liver Function Tests: Recent Labs  Lab 04/20/19 0923 04/21/19 0630 04/23/19 0450 04/24/19 0412 04/25/19 0359  AST 51* 89* 38 29 25  ALT 45* 40 27 30 30   ALKPHOS 93 61 55 56 60   BILITOT 1.4* 1.3* 1.2 0.9 0.9  PROT 7.9 6.3* 5.8* 5.7* 5.5*  ALBUMIN 4.0 2.9* 2.3* 2.2* 2.1*   Recent Labs  Lab 04/21/19 0630 04/21/19 1416 04/22/19 0729 04/23/19 0450 04/26/19 0140  LIPASE 684* 554* 148* 63* 32   No results for input(s): AMMONIA in the last 168 hours. Coagulation Profile: No results for input(s): INR, PROTIME in the last 168 hours. Cardiac Enzymes: No results for input(s): CKTOTAL, CKMB, CKMBINDEX, TROPONINI in the last 168 hours. BNP (last 3 results) No results for input(s): PROBNP in the last 8760 hours. HbA1C: No results for input(s): HGBA1C in the last 72 hours. CBG: Recent Labs  Lab 04/25/19 1214 04/25/19 1651 04/25/19 1932 04/25/19 2306 04/26/19 0341  GLUCAP 170* 161* 159* 177* 165*   Lipid Profile: No results for input(s): CHOL, HDL, LDLCALC, TRIG, CHOLHDL, LDLDIRECT in the last 72 hours. Thyroid Function Tests:  No results for input(s): TSH, T4TOTAL, FREET4, T3FREE, THYROIDAB in the last 72 hours. Anemia Panel: No results for input(s): VITAMINB12, FOLATE, FERRITIN, TIBC, IRON, RETICCTPCT in the last 72 hours. Urine analysis:    Component Value Date/Time   COLORURINE YELLOW 04/20/2019 1708   APPEARANCEUR CLEAR 04/20/2019 1708   LABSPEC 1.022 04/20/2019 1708   PHURINE 5.0 04/20/2019 1708   GLUCOSEU >=500 (A) 04/20/2019 1708   HGBUR SMALL (A) 04/20/2019 1708   BILIRUBINUR NEGATIVE 04/20/2019 1708   KETONESUR 5 (A) 04/20/2019 1708   PROTEINUR NEGATIVE 04/20/2019 1708   UROBILINOGEN 0.2 09/12/2018 0949   NITRITE NEGATIVE 04/20/2019 1708   LEUKOCYTESUR NEGATIVE 04/20/2019 1708   Sepsis Labs: @LABRCNTIP (procalcitonin:4,lacticidven:4)  ) Recent Results (from the past 240 hour(s))  Culture, blood (routine x 2)     Status: None   Collection Time: 04/20/19 12:37 PM   Specimen: BLOOD  Result Value Ref Range Status   Specimen Description BLOOD LEFT ANTECUBITAL  Final   Special Requests   Final    BOTTLES DRAWN AEROBIC AND ANAEROBIC Blood  Culture results may not be optimal due to an inadequate volume of blood received in culture bottles   Culture   Final    NO GROWTH 5 DAYS Performed at [facility], [address], [place], [place]    Report Status 04/25/2019 FINAL  Final  Respiratory Panel by RT PCR (Flu A&B, Covid) - Nasopharyngeal Swab     Status: None   Collection Time: 04/20/19  3:53 PM   Specimen: Nasopharyngeal Swab  Result Value Ref Range Status   SARS Coronavirus 2 by RT PCR NEGATIVE NEGATIVE Final    Comment: (NOTE) SARS-CoV-2 target nucleic acids are NOT DETECTED. The SARS-CoV-2 RNA is generally detectable in upper respiratoy specimens during the acute phase of infection. The lowest concentration of SARS-CoV-2 viral copies this assay can detect is 131 copies/mL. A negative result does not preclude SARS-Cov-2 infection and should not be used as the sole basis for treatment or other patient management decisions. A negative result may occur with  improper specimen collection/handling, submission of specimen other than nasopharyngeal swab, presence of viral mutation(s) within the areas targeted by this assay, and inadequate number of viral copies (<131 copies/mL). A negative result must be combined with clinical observations, patient history, and epidemiological information. The expected result is Negative. Fact Sheet for Patients:  06/18/19 Fact Sheet for Healthcare Providers:  https://www.moore.com/ This test is not yet ap proved or cleared by the https://www.young.biz/ FDA and  has been authorized for detection and/or diagnosis of SARS-CoV-2 by FDA under an Emergency Use Authorization (EUA). This EUA will remain  in effect (meaning this test can be used) for the duration of the COVID-19 declaration under Section 564(b)(1) of the Act, 21 U.S.C. section 360bbb-3(b)(1), unless the authorization is terminated or revoked sooner.    Influenza A by PCR  NEGATIVE NEGATIVE Final   Influenza B by PCR NEGATIVE NEGATIVE Final    Comment: (NOTE) The Xpert Xpress SARS-CoV-2/FLU/RSV assay is intended as an aid in  the diagnosis of influenza from Nasopharyngeal swab specimens and  should not be used as a sole basis for treatment. Nasal washings and  aspirates are unacceptable for Xpert Xpress SARS-CoV-2/FLU/RSV  testing. Fact Sheet for Patients: [place] Fact Sheet for Healthcare Providers: https://www.moore.com/ This test is not yet approved or cleared by the https://www.young.biz/ FDA and  has been authorized for detection and/or diagnosis of SARS-CoV-2 by  FDA under an Emergency Use  Authorization (EUA). This EUA will remain  in effect (meaning this test can be used) for the duration of the  Covid-19 declaration under Section 564(b)(1) of the Act, 21  U.S.C. section 360bbb-3(b)(1), unless the authorization is  terminated or revoked. Performed at [facility], [address], [place], [place] [address]       Radiology Studies: No results found.   Scheduled Meds: . allopurinol  100 mg Oral Daily  . heparin  5,000 Units Subcutaneous Q8H  . insulin aspart  0-9 Units Subcutaneous Q4H  . insulin glargine  18 Units Subcutaneous Daily  . ondansetron (ZOFRAN) IV  4 mg Intravenous TID AC   Continuous Infusions:    LOS: 6 days   Time Spent in minutes   45 minutes  Annalisa Colonna D.O. on 04/26/2019 at 9:55 AM  Between 7am to 7pm - Please see pager noted on [email or web address]  After 7pm go to [email or web address]  And look for the night coverage person covering for me after hours  [facility] Office  [phone]

## 2019-04-27 LAB — GLUCOSE, CAPILLARY
Glucose-Capillary: 181 mg/dL — ABNORMAL HIGH (ref 70–99)
Glucose-Capillary: 168 mg/dL — ABNORMAL HIGH (ref 70–99)
Glucose-Capillary: 185 mg/dL — ABNORMAL HIGH (ref 70–99)
Glucose-Capillary: 193 mg/dL — ABNORMAL HIGH (ref 70–99)
Glucose-Capillary: 213 mg/dL — ABNORMAL HIGH (ref 70–99)
Glucose-Capillary: 239 mg/dL — ABNORMAL HIGH (ref 70–99)
Glucose-Capillary: 207 mg/dL — ABNORMAL HIGH (ref 70–99)
Glucose-Capillary: 184 mg/dL — ABNORMAL HIGH (ref 70–99)

## 2019-04-27 LAB — BASIC METABOLIC PANEL
Anion gap: 13 (ref 5–15)
BUN: 5 mg/dL — ABNORMAL LOW (ref 6–20)
CO2: 26 mmol/L (ref 22–32)
Calcium: 7.9 mg/dL — ABNORMAL LOW (ref 8.9–10.3)
Chloride: 97 mmol/L — ABNORMAL LOW (ref 98–111)
Creatinine, Ser: 0.73 mg/dL (ref 0.61–1.24)
GFR calc Af Amer: >60 mL/min (ref >60)
GFR calc non Af Amer: >60 mL/min (ref >60)
Glucose, Bld: 204 mg/dL — ABNORMAL HIGH (ref 70–99)
Potassium: 2.7 mmol/L — CL (ref 3.5–5.1)
Sodium: 136 mmol/L (ref 135–145)

## 2019-04-27 LAB — CBC
HCT: 35.3 % — ABNORMAL LOW (ref 39.0–52.0)
Hemoglobin: 11.3 g/dL — ABNORMAL LOW (ref 13.0–17.0)
MCH: 25 pg — ABNORMAL LOW (ref 26.0–34.0)
MCHC: 32 g/dL (ref 30.0–36.0)
MCV: 78.1 fL — ABNORMAL LOW (ref 80.0–100.0)
Platelets: 257 10*3/uL (ref 150–400)
RBC: 4.52 MIL/uL (ref 4.22–5.81)
RDW: 14.8 % (ref 11.5–15.5)
WBC: 9.4 10*3/uL (ref 4.0–10.5)
nRBC: 0.4 % — ABNORMAL HIGH (ref 0.0–0.2)

## 2019-04-27 LAB — MAGNESIUM: Magnesium: 1.8 mg/dL (ref 1.7–2.4)

## 2019-04-27 MED ORDER — POTASSIUM CHLORIDE CRYS ER 20 MEQ PO TBCR
40.0000 meq | EXTENDED_RELEASE_TABLET | ORAL | Status: AC
  Administered 2019-04-27 (×2): 40 meq via ORAL
  Filled 2019-04-27 (×2): qty 2

## 2019-04-27 MED ORDER — OXYCODONE-ACETAMINOPHEN 5-325 MG PO TABS
1.0000 | ORAL_TABLET | ORAL | Status: DC | PRN
  Administered 2019-04-27 – 2019-04-29 (×4): 1 via ORAL
  Filled 2019-04-27 (×4): qty 1

## 2019-04-27 NOTE — Progress Notes (Addendum)
Subjective: [name] is a 54 year old black male with a history of Crohn's disease on Humira and insulin-dependent diabetes.  He was admitted on 04/21/2019 with DKA and acute pancreatitis.  He continues to have abdominal pain and rated at a 7 out of 10.  He has been very nauseated this morning and feels like he is about to vomit.  He has had 4-5 loose bowel movements today.  He is he wants to advance his diet and has tried some potato soup this morning but claims his abdominal pain got much worse after he ate.  He has been consuming a lot of dietary sodas this morning as well.  He denies any melena hematochezia..   Objective: Vital signs in last 24 hours: Temp:  [97.9 F (36.6 C)-98.7 F (37.1 C)] 98.6 F (37 C) (01/11 1101) Pulse Rate:  [97-107] 103 (01/11 1101) Resp:  [15-24] 24 (01/11 1101) BP: (110-136)/(47-75) 134/71 (01/11 1101) SpO2:  [94 %-100 %] 96 % (01/11 1101) Last BM Date: 04/27/19  Intake/Output from previous day: 01/10 0701 - 01/11 0700 In: -  Out: 500 [Urine:500] Intake/Output this shift: No intake/output data recorded.  General appearance: alert, cooperative, appears older than stated age, fatigued, moderate distress and morbidly obese Resp: clear to auscultation bilaterally Cardio: regular rate and rhythm, S1, S2 normal, no murmur, click, rub or gallop GI: soft, morbidly obese with periumbilical tenderness on palpation with guarding but without rebound or rigidity; bowel sounds are hypoactive; no masses,  no organomegaly Extremities: extremities normal, atraumatic, no cyanosis with 1 + edema  Lab Results: Recent Labs    04/25/19 0359 04/26/19 0140 04/27/19 0727  WBC 6.5 8.9 9.4  HGB 11.3* 11.4* 11.3*  HCT 35.1* 35.4* 35.3*  PLT 205 179 257   BMET Recent Labs    04/25/19 0359 04/26/19 0140 04/27/19 0727  NA 137 135 136  K 3.0* 3.8 2.7*  CL 100 101 97*  CO2 23 18* 26  GLUCOSE 183* 171* 204*  BUN 7 8 5*  CREATININE 0.71 0.68 0.73  CALCIUM 7.5*  7.6* 7.9*   LFT Recent Labs    04/25/19 0359  PROT 5.5*  ALBUMIN 2.1*  AST 25  ALT 30  ALKPHOS 60  BILITOT 0.9   Medications: I have reviewed the patient's current medications.  Assessment/Plan: 1) Acute pancreatitis probably drug-induced from Trulicity and HCTZ-which are presently on hold as per my discussion with Dr. Catha Gosselin.  He will need extreme close monitoring and I have encouraged him to stay on ice chips for a couple of days before attempting to eat again. 2) Crohn's disease on Humira. 3) Insulin-dependent diabetes/morbid obesity. 4) Hypokalemia-replace as needed. 5) Hypocalcemia. 5) Hypoalbuminemia.  LOS: 7 days   Charna Elizabeth 04/27/2019, 1:14 PM

## 2019-04-27 NOTE — Plan of Care (Signed)
  Problem: Education: Goal: Ability to describe self-care measures that may prevent or decrease complications (Diabetes Survival Skills Education) will improve Outcome: Progressing   Problem: Cardiac: Goal: Ability to maintain an adequate cardiac output will improve Outcome: Progressing   Problem: Fluid Volume: Goal: Ability to achieve a balanced intake and output will improve Outcome: Progressing   Problem: Metabolic: Goal: Ability to maintain appropriate glucose levels will improve Outcome: Progressing   Problem: Nutritional: Goal: Maintenance of adequate nutrition will improve Outcome: Progressing   Problem: Urinary Elimination: Goal: Ability to achieve and maintain adequate renal perfusion and functioning will improve Outcome: Progressing   Problem: Clinical Measurements: Goal: Complications related to the disease process, condition or treatment will be avoided or minimized Outcome: Progressing

## 2019-04-27 NOTE — Progress Notes (Signed)
PROGRESS NOTE    Caleb Chambers  HYQ:[id number] DOB: November 29, 1965 DOA: 04/20/2019 PCP: Patient, No Pcp Per   Brief Narrative:  HPI On 04/20/2019 by Dr. Wynetta Fines Caleb Chambers is a 54 y.o. male with medical history significant of IDDM, HTN, abdominal pain and vomiting. Started 2-3 days ago after heavy meal. Pain is diffuse across his upper abdomen and severe, cramping like. He has not taken anything specifically for it. He has had 20-30 episodes of vomiting and loose diarrhea as well. No fevers or cough. He stopped taking all his meds, including insulin and the Metformin since yesterday.  Interim history Admitted with DKA and acute pancreatitis.  Gastroenterology also consulted.  Patient was on an insulin drip however has now been transitioned off.  Her diet is improving.  Patient now with shortness of breath, suspect volume overload.  Chest x-ray obtained showing volume overload, no history of heart failure. Given IV lasix. Taken off of dialudid drip, placed on IV doses. Will increase to full liquids today.  Assessment & Plan   Acute pancreatitis/mildly elevated LFTs with abdominal pain, nausea, vomiting -Progressing very slowly -Lipase 1319 on admission, however improved down to 63 -Patient was placed on IV fluids, currently n.p.o.- will discuss with GI if patient can be placed on clear fluids -Has had a history of cholecystectomy -Gastroenterology consulted and appreciated.  Dr. Benson Norway recommended a PCA pump for his pain along with continuous aggressive IV fluid hydration.   -IVF discontinued due to dyspnea -Continue antiemetics  -Patient feels his pain is better this morning, currently on dilaudid PCA-transition off of PCA pump and placed on IV as needed -Able to tolerate some liquids. -Will schedule Zofran 3 times daily to see if patient is able to tolerate more oral intake -patient states he was able to tolerate clears, will advance to full liquids today and continue to  monitor -patient states he feels he is ready to go home, but has not been able to eat anything and is inquiring about pain meds when he goes home  Dyspnea -Improving -Suspect secondary to volume overload -Chest x-ray does show low volumes, and volume overload -Discontinued IVF  -Was given dose of IV lasix -No history of CHF -BNP 99.2  DKA, anion gap metabolic acidosis/ Diabetes mellitus, type II -Suspect secondary to the above process -Presented with with anion gap of 26, glucose of 870 -Has now improved and closed, bicarb is also resolved and up to 26 today. -Transitioned off of insulin drip tp Lantus and insulin sliding scale every 4 hours -Diabetes coordinator consulted and appreciated -Hemoglobin A1c 13.2 -Of note patient is on Trulicity at home which may cause pancreatitis.  Should be discontinued upon discharge.  Sepsis  -present on admission -unknown source, possibly secondary to the above -Leukocytosis and lactic acidosis have resolved -Patient continues to be tachycardic and tachypneic which may be secondary to pain- improving   Essential hypertension -BP currently stable -Lisinopril held  Acute kidney injury -Creatinine peaked to 1.81 -However this morning is improved to 0.73 -Continue to monitor BMP  Hyponatremia -Sodium is 129 on admission, suspect secondary to DKA -Has since resolved, and improved to 136  Hypokalemia -will replace and continue to monitor BMP -Will obtain magnesium level  DVT Prophylaxis Heparin  Code Status: Full  Family Communication: None at bedside  Disposition Plan: Admitted.  Pending improvement in abdominal pain/pancreatitis as well as GI recommendations, as well respiratory status.  Suspect home when stable  Consultants Gastroenterology  Procedures  None  Antibiotics   Anti-infectives (From admission, onward)   None      Subjective:   Caleb Chambers seen and examined today.  Patient states he did not take any pain  medication overnight but states his abdominal pain has improved and he feels he is ready to go home.  Patient also states he has not been able to eat anything yet and is not sure if he can.  He inquires about pain medicines to take home.  No vomiting, chest pain or shortness of breath, dizziness or headache.   Objective:   Vitals:   04/26/19 2302 04/26/19 2303 04/27/19 0400 04/27/19 0742  BP:  132/75 136/69 (!) 120/48  Pulse: (!) 104 (!) 105 (!) 101 97  Resp:  15 17 (!) 21  Temp:  98.7 F (37.1 C) 98 F (36.7 C) 97.9 F (36.6 C)  TempSrc:  Oral Axillary Oral  SpO2:  94% 96% 100%  Weight:      Height:       No intake or output data in the 24 hours ending 04/27/19 0923 Filed Weights   04/21/19 1538  Weight: (!) 145.2 kg   Exam  General: Well developed, well nourished, NAD, appears stated age  HEENT: NCAT, mucous membranes moist.   Cardiovascular: S1 S2 auscultated, RRR  Respiratory: Clear to auscultation bilaterally with equal chest rise  Abdomen: Soft, obese, mildly TTP epigastric region, nondistended, + bowel sounds  Extremities: warm dry without cyanosis clubbing or edema  Neuro: AAOx3, nonfocal  Psych: Appropriate mood and affect  Data Reviewed: I have personally reviewed following labs and imaging studies  CBC: Recent Labs  Lab 04/23/19 0450 04/24/19 0412 04/25/19 0359 04/26/19 0140 04/27/19 0727  WBC 7.5 6.3 6.5 8.9 9.4  HGB 11.9* 10.9* 11.3* 11.4* 11.3*  HCT 36.9* 34.6* 35.1* 35.4* 35.3*  MCV 79.5* 79.9* 78.9* 79.9* 78.1*  PLT 166 194 205 179 257   Basic Metabolic Panel: Recent Labs  Lab 04/20/19 1708 04/23/19 0450 04/24/19 0412 04/25/19 0359 04/26/19 0140 04/27/19 0727  NA  --  137 137 137 135 136  K  --  3.7 3.3* 3.0* 3.8 2.7*  CL  --  105 101 100 101 97*  CO2  --  18* 23 23 18* 26  GLUCOSE  --  203* 194* 183* 171* 204*  BUN  --  10 11 7 8  5*  CREATININE  --  0.79 0.69 0.71 0.68 0.73  CALCIUM  --  7.0* 7.1* 7.5* 7.6* 7.9*  MG 2.2  --   1.8  --   --   --   PHOS 3.5  --   --   --   --   --    GFR: Estimated Creatinine Clearance: 157.8 mL/min (by C-G formula based on SCr of 0.73 mg/dL). Liver Function Tests: Recent Labs  Lab 04/21/19 0630 04/23/19 0450 04/24/19 0412 04/25/19 0359  AST 89* 38 29 25  ALT 40 27 30 30   ALKPHOS 61 55 56 60  BILITOT 1.3* 1.2 0.9 0.9  PROT 6.3* 5.8* 5.7* 5.5*  ALBUMIN 2.9* 2.3* 2.2* 2.1*   Recent Labs  Lab 04/21/19 0630 04/21/19 1416 04/22/19 0729 04/23/19 0450 04/26/19 0140  LIPASE 684* 554* 148* 63* 32   No results for input(s): AMMONIA in the last 168 hours. Coagulation Profile: No results for input(s): INR, PROTIME in the last 168 hours. Cardiac Enzymes: No results for input(s): CKTOTAL, CKMB, CKMBINDEX, TROPONINI in the last 168 hours. BNP (last 3 results) No results for  input(s): PROBNP in the last 8760 hours. HbA1C: No results for input(s): HGBA1C in the last 72 hours. CBG: Recent Labs  Lab 04/26/19 2234 04/26/19 2309 04/27/19 0121 04/27/19 0340 04/27/19 0748  GLUCAP 176* 181* 168* 185* 193*   Lipid Profile: No results for input(s): CHOL, HDL, LDLCALC, TRIG, CHOLHDL, LDLDIRECT in the last 72 hours. Thyroid Function Tests: No results for input(s): TSH, T4TOTAL, FREET4, T3FREE, THYROIDAB in the last 72 hours. Anemia Panel: No results for input(s): VITAMINB12, FOLATE, FERRITIN, TIBC, IRON, RETICCTPCT in the last 72 hours. Urine analysis:    Component Value Date/Time   COLORURINE YELLOW 04/20/2019 1708   APPEARANCEUR CLEAR 04/20/2019 1708   LABSPEC 1.022 04/20/2019 1708   PHURINE 5.0 04/20/2019 1708   GLUCOSEU >=500 (A) 04/20/2019 1708   HGBUR SMALL (A) 04/20/2019 1708   BILIRUBINUR NEGATIVE 04/20/2019 1708   KETONESUR 5 (A) 04/20/2019 1708   PROTEINUR NEGATIVE 04/20/2019 1708   UROBILINOGEN 0.2 09/12/2018 0949   NITRITE NEGATIVE 04/20/2019 1708   LEUKOCYTESUR NEGATIVE 04/20/2019 1708   Sepsis  Labs: @LABRCNTIP (procalcitonin:4,lacticidven:4)  ) Recent Results (from the past 240 hour(s))  Culture, blood (routine x 2)     Status: None   Collection Time: 04/20/19 12:37 PM   Specimen: BLOOD  Result Value Ref Range Status   Specimen Description BLOOD LEFT ANTECUBITAL  Final   Special Requests   Final    BOTTLES DRAWN AEROBIC AND ANAEROBIC Blood Culture results may not be optimal due to an inadequate volume of blood received in culture bottles   Culture   Final    NO GROWTH 5 DAYS Performed at [facility], [address] [place], [place]    Report Status 04/25/2019 FINAL  Final  Respiratory Panel by RT PCR (Flu A&B, Covid) - Nasopharyngeal Swab     Status: None   Collection Time: 04/20/19  3:53 PM   Specimen: Nasopharyngeal Swab  Result Value Ref Range Status   SARS Coronavirus 2 by RT PCR NEGATIVE NEGATIVE Final    Comment: (NOTE) SARS-CoV-2 target nucleic acids are NOT DETECTED. The SARS-CoV-2 RNA is generally detectable in upper respiratoy specimens during the acute phase of infection. The lowest concentration of SARS-CoV-2 viral copies this assay can detect is 131 copies/mL. A negative result does not preclude SARS-Cov-2 infection and should not be used as the sole basis for treatment or other patient management decisions. A negative result may occur with  improper specimen collection/handling, submission of specimen other than nasopharyngeal swab, presence of viral mutation(s) within the areas targeted by this assay, and inadequate number of viral copies (<131 copies/mL). A negative result must be combined with clinical observations, patient history, and epidemiological information. The expected result is Negative. Fact Sheet for Patients:  06/18/19 Fact Sheet for Healthcare Providers:  https://www.moore.com/ This test is not yet ap proved or cleared by the https://www.young.biz/ FDA and  has been  authorized for detection and/or diagnosis of SARS-CoV-2 by FDA under an Emergency Use Authorization (EUA). This EUA will remain  in effect (meaning this test can be used) for the duration of the COVID-19 declaration under Section 564(b)(1) of the Act, 21 U.S.C. section 360bbb-3(b)(1), unless the authorization is terminated or revoked sooner.    Influenza A by PCR NEGATIVE NEGATIVE Final   Influenza B by PCR NEGATIVE NEGATIVE Final    Comment: (NOTE) The Xpert Xpress SARS-CoV-2/FLU/RSV assay is intended as an aid in  the diagnosis of influenza from Nasopharyngeal swab specimens and  should not be  used as a sole basis for treatment. Nasal washings and  aspirates are unacceptable for Xpert Xpress SARS-CoV-2/FLU/RSV  testing. Fact Sheet for Patients: https://www.moore.com/ Fact Sheet for Healthcare Providers: https://www.young.biz/ This test is not yet approved or cleared by the [place] FDA and  has been authorized for detection and/or diagnosis of SARS-CoV-2 by  FDA under an Emergency Use Authorization (EUA). This EUA will remain  in effect (meaning this test can be used) for the duration of the  Covid-19 declaration under Section 564(b)(1) of the Act, 21  U.S.C. section 360bbb-3(b)(1), unless the authorization is  terminated or revoked. Performed at [facility], [address] [place], [place] [address]       Radiology Studies: No results found.   Scheduled Meds: . allopurinol  100 mg Oral Daily  . heparin  5,000 Units Subcutaneous Q8H  . insulin aspart  0-9 Units Subcutaneous Q4H  . insulin glargine  18 Units Subcutaneous Daily  . ondansetron (ZOFRAN) IV  4 mg Intravenous TID AC  . potassium chloride  40 mEq Oral Q4H   Continuous Infusions:    LOS: 7 days   Time Spent in minutes   45 minutes  Caleb Chambers D.O. on 04/27/2019 at 9:23 AM  Between 7am to 7pm - Please see pager noted on [email or web address]  After 7pm go to  [email or web address]  And look for the night coverage person covering for me after hours  [facility] Office  [phone]

## 2019-04-28 LAB — BASIC METABOLIC PANEL
Anion gap: 12 (ref 5–15)
Anion gap: 13 (ref 5–15)
BUN: 5 mg/dL — ABNORMAL LOW (ref 6–20)
BUN: 5 mg/dL — ABNORMAL LOW (ref 6–20)
CO2: 24 mmol/L (ref 22–32)
CO2: 22 mmol/L (ref 22–32)
Calcium: 8 mg/dL — ABNORMAL LOW (ref 8.9–10.3)
Calcium: 7.9 mg/dL — ABNORMAL LOW (ref 8.9–10.3)
Chloride: 101 mmol/L (ref 98–111)
Chloride: 99 mmol/L (ref 98–111)
Creatinine, Ser: 0.86 mg/dL (ref 0.61–1.24)
Creatinine, Ser: 0.78 mg/dL (ref 0.61–1.24)
GFR calc Af Amer: >60 mL/min (ref >60)
GFR calc Af Amer: >60 mL/min (ref >60)
GFR calc non Af Amer: >60 mL/min (ref >60)
GFR calc non Af Amer: >60 mL/min (ref >60)
Glucose, Bld: 247 mg/dL — ABNORMAL HIGH (ref 70–99)
Glucose, Bld: 237 mg/dL — ABNORMAL HIGH (ref 70–99)
Potassium: 2.8 mmol/L — ABNORMAL LOW (ref 3.5–5.1)
Potassium: 2.9 mmol/L — ABNORMAL LOW (ref 3.5–5.1)
Sodium: 137 mmol/L (ref 135–145)
Sodium: 134 mmol/L — ABNORMAL LOW (ref 135–145)

## 2019-04-28 LAB — MAGNESIUM: Magnesium: 1.7 mg/dL (ref 1.7–2.4)

## 2019-04-28 LAB — GLUCOSE, CAPILLARY
Glucose-Capillary: 204 mg/dL — ABNORMAL HIGH (ref 70–99)
Glucose-Capillary: 209 mg/dL — ABNORMAL HIGH (ref 70–99)
Glucose-Capillary: 210 mg/dL — ABNORMAL HIGH (ref 70–99)
Glucose-Capillary: 217 mg/dL — ABNORMAL HIGH (ref 70–99)
Glucose-Capillary: 239 mg/dL — ABNORMAL HIGH (ref 70–99)
Glucose-Capillary: 239 mg/dL — ABNORMAL HIGH (ref 70–99)

## 2019-04-28 MED ORDER — POTASSIUM CHLORIDE 10 MEQ/100ML IV SOLN
10.0000 meq | INTRAVENOUS | Status: AC
  Administered 2019-04-28 (×4): 10 meq via INTRAVENOUS
  Filled 2019-04-28 (×4): qty 100

## 2019-04-28 MED ORDER — POTASSIUM CHLORIDE 20 MEQ PO PACK
40.0000 meq | PACK | Freq: Two times a day (BID) | ORAL | Status: DC
  Administered 2019-04-28 (×2): 40 meq via ORAL
  Filled 2019-04-28 (×2): qty 2

## 2019-04-28 NOTE — Progress Notes (Signed)
Inpatient Diabetes Program Recommendations  AACE/ADA: New Consensus Statement on Inpatient Glycemic Control (2015)  Target Ranges:  Prepandial:   less than 140 mg/dL      Peak postprandial:   less than 180 mg/dL (1-2 hours)      Critically ill patients:  140 - 180 mg/dL   Lab Results  Component Value Date   GLUCAP 239 (H) 04/28/2019   HGBA1C 13.2 (H) 04/22/2019    Review of Glycemic Control Results for Caleb Chambers, Caleb Chambers (MRN [id number]) as of 04/28/2019 12:55  Ref. Range 04/27/2019 23:16 04/28/2019 03:26 04/28/2019 07:28 04/28/2019 11:59  Glucose-Capillary Latest Ref Range: 70 - 99 mg/dL 184 (H) 210 (H) 239 (H) 239 (H)   Diabetes history:Dm 2 Outpatient Diabetes medications:Basaglar 15 units one day, 10 units next day, 5 units the next day then regimen starts over. Trulicity 1.5 mg Saturday, Metformin 2000 mg Daily Current orders for Inpatient glycemic control: Lantus 18 units Daily, Novolog 0-9 units Q4H  Inpatient Diabetes Program Recommendations:  Consider increasing Lantus to 22 units QD.   Pt will need glucose meter kit at time of d/c. (order # [id number]).  Thanks,  , MSN, RNC-OB Diabetes Coordinator [phone] (8a-5p)  

## 2019-04-28 NOTE — Progress Notes (Signed)
PROGRESS NOTE    Caleb Chambers  OXB:[id number] DOB: [birth year] DOA: 04/20/2019 PCP: Patient, No Pcp Per   Brief Narrative:  HPI On 04/20/2019 by Dr. Wynetta Fines Caleb Chambers is a 54 y.o. male with medical history significant of IDDM, HTN, abdominal pain and vomiting. Started 2-3 days ago after heavy meal. Pain is diffuse across his upper abdomen and severe, cramping like. He has not taken anything specifically for it. He has had 20-30 episodes of vomiting and loose diarrhea as well. No fevers or cough. He stopped taking all his meds, including insulin and the Metformin since yesterday.  Interim history Admitted with DKA and acute pancreatitis.  Gastroenterology also consulted.  Patient was on an insulin drip however has now been transitioned off.  Her diet is improving.  Patient now with shortness of breath, suspect volume overload.  Chest x-ray obtained showing volume overload, no history of heart failure. Given IV lasix. Taken off of dialudid drip, placed on IV doses. Will increase to full liquids today.  Assessment & Plan   Acute pancreatitis/mildly elevated LFTs with abdominal pain, nausea, vomiting -Progressing very slowly -Lipase 1319 on admission, however improved down to 63 -Patient was placed on IV fluids, currently n.p.o.- will discuss with GI if patient can be placed on clear fluids -Has had a history of cholecystectomy -Gastroenterology consulted and appreciated.  Dr. Benson Norway recommended a PCA pump for his pain along with continuous aggressive IV fluid hydration.   -IVF discontinued due to dyspnea -Continue antiemetics  -Patient feels his pain is better this morning, currently on dilaudid PCA-transition off of PCA pump and placed on IV as needed -Able to tolerate some liquids. -Continue scheduled Zofran 3 times daily to see if patient is able to tolerate more oral intake -Continue full as today as patient has not been able to tolerate much.  He states that he had some nausea.   Per RN, patient had vomiting overnight.  Dyspnea -Resolved -Suspect secondary to volume overload -Chest x-ray showed low volumes, and volume overload -Discontinued IVF  -Was given dose of IV lasix -No history of CHF -BNP 99.2  DKA, anion gap metabolic acidosis/ Diabetes mellitus, type II -Suspect secondary to the above process -Presented with with anion gap of 26, glucose of 870 -Has now improved and closed, bicarb is also resolved and up to 26 today. -Transitioned off of insulin drip tp Lantus and insulin sliding scale every 4 hours -Diabetes coordinator consulted and appreciated -Hemoglobin A1c 13.2 -Of note patient is on Trulicity at home which may cause pancreatitis.  Should be discontinued upon discharge.  Sepsis  -present on admission -unknown source, possibly secondary to the above -Leukocytosis and lactic acidosis have resolved -Patient continues to be tachycardic and tachypneic which may be secondary to pain- improving   Essential hypertension -BP currently stable -Lisinopril held  Acute kidney injury -Creatinine peaked to 1.81 -However this morning is improved to 0.86 -Continue to monitor BMP  Hyponatremia -Sodium is 129 on admission, suspect secondary to DKA -Has since resolved, and improved to 137  Hypokalemia -Continue to replace and monitor BMP -Obtain magnesium level  DVT Prophylaxis Heparin  Code Status: Full  Family Communication: None at bedside  Disposition Plan: Admitted.  Pending improvement in abdominal pain/pancreatitis and ability to maintain oral intake, as well as correction of electrolytes. Suspect home when stable  Consultants Gastroenterology  Procedures  None  Antibiotics   Anti-infectives (From admission, onward)   None      Subjective:  Caleb Chambers seen and examined today.  States he has not taken many pain medications.  He denies current abdominal pain, but did have some nausea yesterday with vomiting overnight.   States he feels better today and will try to increase his oral intake today.  Denies chest pain or shortness of breath, dizziness or headache. Objective:   Vitals:   04/28/19 0045 04/28/19 0348 04/28/19 0802 04/28/19 1109  BP: (!) 117/57 (!) 141/80 134/70 (!) 148/78  Pulse: 100 (!) 102 92 94  Resp: 18 18 16  (!) 22  Temp: 97.9 F (36.6 C) (!) 97.4 F (36.3 C) 97.8 F (36.6 C) 99.1 F (37.3 C)  TempSrc: Oral Oral Oral Oral  SpO2: 96% 95% 98% 97%  Weight:      Height:       No intake or output data in the 24 hours ending 04/28/19 1159 Filed Weights   04/21/19 1538  Weight: (!) 145.2 kg   Exam  General: Well developed, well nourished, NAD, appears stated age  HEENT: NCAT, mucous membranes moist.   Cardiovascular: S1 S2 auscultated, RRR  Respiratory: Clear to auscultation bilaterally   Abdomen: Soft, obese, minimal upper abdominal TTP, nondistended, + bowel sounds  Extremities: warm dry without cyanosis clubbing or edema  Neuro: AAOx3, nonfocal   Psych: Appropriate mood and affect, pleasant  Data Reviewed: I have personally reviewed following labs and imaging studies  CBC: Recent Labs  Lab 04/23/19 0450 04/24/19 0412 04/25/19 0359 04/26/19 0140 04/27/19 0727  WBC 7.5 6.3 6.5 8.9 9.4  HGB 11.9* 10.9* 11.3* 11.4* 11.3*  HCT 36.9* 34.6* 35.1* 35.4* 35.3*  MCV 79.5* 79.9* 78.9* 79.9* 78.1*  PLT 166 194 205 179 257   Basic Metabolic Panel: Recent Labs  Lab 04/24/19 0412 04/25/19 0359 04/26/19 0140 04/27/19 0727 04/28/19 0747  NA 137 137 135 136 137  K 3.3* 3.0* 3.8 2.7* 2.8*  CL 101 100 101 97* 101  CO2 23 23 18* 26 24  GLUCOSE 194* 183* 171* 204* 247*  BUN 11 7 8  5* 5*  CREATININE 0.69 0.71 0.68 0.73 0.86  CALCIUM 7.1* 7.5* 7.6* 7.9* 8.0*  MG 1.8  --   --  1.8  --    GFR: Estimated Creatinine Clearance: 146.8 mL/min (by C-G formula based on SCr of 0.86 mg/dL). Liver Function Tests: Recent Labs  Lab 04/23/19 0450 04/24/19 0412 04/25/19 0359    AST 38 29 25  ALT 27 30 30   ALKPHOS 55 56 60  BILITOT 1.2 0.9 0.9  PROT 5.8* 5.7* 5.5*  ALBUMIN 2.3* 2.2* 2.1*   Recent Labs  Lab 04/21/19 1416 04/22/19 0729 04/23/19 0450 04/26/19 0140  LIPASE 554* 148* 63* 32   No results for input(s): AMMONIA in the last 168 hours. Coagulation Profile: No results for input(s): INR, PROTIME in the last 168 hours. Cardiac Enzymes: No results for input(s): CKTOTAL, CKMB, CKMBINDEX, TROPONINI in the last 168 hours. BNP (last 3 results) No results for input(s): PROBNP in the last 8760 hours. HbA1C: No results for input(s): HGBA1C in the last 72 hours. CBG: Recent Labs  Lab 04/27/19 1538 04/27/19 1937 04/27/19 2316 04/28/19 0326 04/28/19 0728  GLUCAP 239* 207* 184* 210* 239*   Lipid Profile: No results for input(s): CHOL, HDL, LDLCALC, TRIG, CHOLHDL, LDLDIRECT in the last 72 hours. Thyroid Function Tests: No results for input(s): TSH, T4TOTAL, FREET4, T3FREE, THYROIDAB in the last 72 hours. Anemia Panel: No results for input(s): VITAMINB12, FOLATE, FERRITIN, TIBC, IRON, RETICCTPCT in the last 72  hours. Urine analysis:    Component Value Date/Time   COLORURINE YELLOW 04/20/2019 1708   APPEARANCEUR CLEAR 04/20/2019 1708   LABSPEC 1.022 04/20/2019 1708   PHURINE 5.0 04/20/2019 1708   GLUCOSEU >=500 (A) 04/20/2019 1708   HGBUR SMALL (A) 04/20/2019 1708   BILIRUBINUR NEGATIVE 04/20/2019 1708   KETONESUR 5 (A) 04/20/2019 1708   PROTEINUR NEGATIVE 04/20/2019 1708   UROBILINOGEN 0.2 09/12/2018 0949   NITRITE NEGATIVE 04/20/2019 1708   LEUKOCYTESUR NEGATIVE 04/20/2019 1708   Sepsis Labs: @LABRCNTIP (procalcitonin:4,lacticidven:4)  ) Recent Results (from the past 240 hour(s))  Culture, blood (routine x 2)     Status: None   Collection Time: 04/20/19 12:37 PM   Specimen: BLOOD  Result Value Ref Range Status   Specimen Description BLOOD LEFT ANTECUBITAL  Final   Special Requests   Final    BOTTLES DRAWN AEROBIC AND ANAEROBIC Blood  Culture results may not be optimal due to an inadequate volume of blood received in culture bottles   Culture   Final    NO GROWTH 5 DAYS Performed at [facility], [address] [place], [place]    Report Status 04/25/2019 FINAL  Final  Respiratory Panel by RT PCR (Flu A&B, Covid) - Nasopharyngeal Swab     Status: None   Collection Time: 04/20/19  3:53 PM   Specimen: Nasopharyngeal Swab  Result Value Ref Range Status   SARS Coronavirus 2 by RT PCR NEGATIVE NEGATIVE Final    Comment: (NOTE) SARS-CoV-2 target nucleic acids are NOT DETECTED. The SARS-CoV-2 RNA is generally detectable in upper respiratoy specimens during the acute phase of infection. The lowest concentration of SARS-CoV-2 viral copies this assay can detect is 131 copies/mL. A negative result does not preclude SARS-Cov-2 infection and should not be used as the sole basis for treatment or other patient management decisions. A negative result may occur with  improper specimen collection/handling, submission of specimen other than nasopharyngeal swab, presence of viral mutation(s) within the areas targeted by this assay, and inadequate number of viral copies (<131 copies/mL). A negative result must be combined with clinical observations, patient history, and epidemiological information. The expected result is Negative. Fact Sheet for Patients:  06/18/19 Fact Sheet for Healthcare Providers:  https://www.moore.com/ This test is not yet ap proved or cleared by the https://www.young.biz/ FDA and  has been authorized for detection and/or diagnosis of SARS-CoV-2 by FDA under an Emergency Use Authorization (EUA). This EUA will remain  in effect (meaning this test can be used) for the duration of the COVID-19 declaration under Section 564(b)(1) of the Act, 21 U.S.C. section 360bbb-3(b)(1), unless the authorization is terminated or revoked sooner.    Influenza A by PCR  NEGATIVE NEGATIVE Final   Influenza B by PCR NEGATIVE NEGATIVE Final    Comment: (NOTE) The Xpert Xpress SARS-CoV-2/FLU/RSV assay is intended as an aid in  the diagnosis of influenza from Nasopharyngeal swab specimens and  should not be used as a sole basis for treatment. Nasal washings and  aspirates are unacceptable for Xpert Xpress SARS-CoV-2/FLU/RSV  testing. Fact Sheet for Patients: [place] Fact Sheet for Healthcare Providers: https://www.moore.com/ This test is not yet approved or cleared by the https://www.young.biz/ FDA and  has been authorized for detection and/or diagnosis of SARS-CoV-2 by  FDA under an Emergency Use Authorization (EUA). This EUA will remain  in effect (meaning this test can be used) for the duration of the  Covid-19 declaration under Section 564(b)(1) of the Act, 21  U.S.C. section 360bbb-3(b)(1), unless the authorization is  terminated or revoked. Performed at [facility], [address] [place], [place] [address]       Radiology Studies: No results found.   Scheduled Meds: . allopurinol  100 mg Oral Daily  . heparin  5,000 Units Subcutaneous Q8H  . insulin aspart  0-9 Units Subcutaneous Q4H  . insulin glargine  18 Units Subcutaneous Daily  . ondansetron (ZOFRAN) IV  4 mg Intravenous TID AC  . potassium chloride  40 mEq Oral BID   Continuous Infusions: . potassium chloride 10 mEq (04/28/19 1100)     LOS: 8 days   Time Spent in minutes   45 minutes  Daksh Coates D.O. on 04/28/2019 at 11:59 AM  Between 7am to 7pm - Please see pager noted on [email or web address]  After 7pm go to [email or web address]  And look for the night coverage person covering for me after hours  [facility] Office  [phone]

## 2019-04-28 NOTE — Progress Notes (Signed)
Subjective: No complaints.  He desires to go home.  Objective: Vital signs in last 24 hours: Temp:  [97.4 F (36.3 C)-98.6 F (37 C)] 97.8 F (36.6 C) (01/12 0802) Pulse Rate:  [92-106] 92 (01/12 0802) Resp:  [16-24] 16 (01/12 0802) BP: (117-145)/(57-82) 134/70 (01/12 0802) SpO2:  [95 %-98 %] 98 % (01/12 0802) Last BM Date: 04/27/19  Intake/Output from previous day: No intake/output data recorded. Intake/Output this shift: No intake/output data recorded.  General appearance: alert and no distress GI: minimal upper abdominal discomfort  Lab Results: Recent Labs    04/26/19 0140 04/27/19 0727  WBC 8.9 9.4  HGB 11.4* 11.3*  HCT 35.4* 35.3*  PLT 179 257   BMET Recent Labs    04/26/19 0140 04/27/19 0727  NA 135 136  K 3.8 2.7*  CL 101 97*  CO2 18* 26  GLUCOSE 171* 204*  BUN 8 5*  CREATININE 0.68 0.73  CALCIUM 7.6* 7.9*   LFT No results for input(s): PROT, ALBUMIN, AST, ALT, ALKPHOS, BILITOT, BILIDIR, IBILI in the last 72 hours. PT/INR No results for input(s): LABPROT, INR in the last 72 hours. Hepatitis Panel No results for input(s): HEPBSAG, HCVAB, HEPAIGM, HEPBIGM in the last 72 hours. C-Diff No results for input(s): CDIFFTOX in the last 72 hours. Fecal Lactopherrin No results for input(s): FECLLACTOFRN in the last 72 hours.  Studies/Results: No results found.  Medications:  Scheduled: . allopurinol  100 mg Oral Daily  . heparin  5,000 Units Subcutaneous Q8H  . insulin aspart  0-9 Units Subcutaneous Q4H  . insulin glargine  18 Units Subcutaneous Daily  . ondansetron (ZOFRAN) IV  4 mg Intravenous TID AC  . potassium chloride  40 mEq Oral BID   Continuous: . potassium chloride      Assessment/Plan: 1) Acute pancreatitis. 2) Hypokalemia. 3) DM.   The patient's potassium is low and this needs to be repleted before he can be discharged home.  His blood sugar is also elevated in the 200 range.  He was able to tolerate PO without any significant  difficulty.  The use of pain medications is low.  Plan: 1) KCL 40 meq IV and then 40 meq BID.  If his potassium can be improved and stabilized by the end of the day, he can be discharged home. 2) He will need to follow up with his PCP to have aggressive control of his DM.  LOS: 8 days   Averly Ericson D 04/28/2019, 8:10 AM

## 2019-04-29 LAB — BASIC METABOLIC PANEL
Anion gap: 12 (ref 5–15)
BUN: <5 mg/dL — ABNORMAL LOW (ref 6–20)
CO2: 23 mmol/L (ref 22–32)
Calcium: 8 mg/dL — ABNORMAL LOW (ref 8.9–10.3)
Chloride: 99 mmol/L (ref 98–111)
Creatinine, Ser: 0.75 mg/dL (ref 0.61–1.24)
GFR calc Af Amer: >60 mL/min (ref >60)
GFR calc non Af Amer: >60 mL/min (ref >60)
Glucose, Bld: 208 mg/dL — ABNORMAL HIGH (ref 70–99)
Potassium: 3 mmol/L — ABNORMAL LOW (ref 3.5–5.1)
Sodium: 134 mmol/L — ABNORMAL LOW (ref 135–145)

## 2019-04-29 LAB — MAGNESIUM: Magnesium: 1.7 mg/dL (ref 1.7–2.4)

## 2019-04-29 LAB — CBC
HCT: 33.3 % — ABNORMAL LOW (ref 39.0–52.0)
Hemoglobin: 11 g/dL — ABNORMAL LOW (ref 13.0–17.0)
MCH: 25.4 pg — ABNORMAL LOW (ref 26.0–34.0)
MCHC: 33 g/dL (ref 30.0–36.0)
MCV: 76.9 fL — ABNORMAL LOW (ref 80.0–100.0)
Platelets: 258 10*3/uL (ref 150–400)
RBC: 4.33 MIL/uL (ref 4.22–5.81)
RDW: 14.6 % (ref 11.5–15.5)
WBC: 11 10*3/uL — ABNORMAL HIGH (ref 4.0–10.5)
nRBC: 0 % (ref 0.0–0.2)

## 2019-04-29 LAB — GLUCOSE, CAPILLARY
Glucose-Capillary: 211 mg/dL — ABNORMAL HIGH (ref 70–99)
Glucose-Capillary: 250 mg/dL — ABNORMAL HIGH (ref 70–99)

## 2019-04-29 MED ORDER — POTASSIUM CHLORIDE CRYS ER 20 MEQ PO TBCR: 40.0000 meq | EXTENDED_RELEASE_TABLET | Freq: Once | ORAL | Status: DC

## 2019-04-29 MED ORDER — POTASSIUM CHLORIDE CRYS ER 20 MEQ PO TBCR
40.0000 meq | EXTENDED_RELEASE_TABLET | ORAL | Status: DC
  Administered 2019-04-29: 40 meq via ORAL
  Filled 2019-04-29: qty 2

## 2019-04-29 MED ORDER — POTASSIUM CHLORIDE ER 10 MEQ PO TBCR: 20.0000 meq | EXTENDED_RELEASE_TABLET | Freq: Every day | ORAL | 0 refills | Status: DC

## 2019-04-29 MED ORDER — OXYCODONE-ACETAMINOPHEN 5-325 MG PO TABS: 1.0000 | ORAL_TABLET | ORAL | 0 refills | Status: AC | PRN

## 2019-04-29 NOTE — Discharge Instructions (Signed)
Follow with Primary MD in 2 weeks   Please get a complete blood count and chemistry panel checked by your Primary MD at your next visit, and again as instructed by your Primary MD. Please get your medications reviewed and adjusted by your Primary MD.  Please request your Primary MD to go over all Hospital Tests and Procedure/Radiological results at the follow up, please get all Hospital records sent to your Prim MD by signing hospital release before you go home.  In some cases, there will be blood work, cultures and biopsy results pending at the time of your discharge. Please request that your primary care M.D. goes through all the records of your hospital data and follows up on these results.  If you had Pneumonia of Lung problems at the Hospital: Please get a 2 view Chest X ray done in 6-8 weeks after hospital discharge or sooner if instructed by your Primary MD.  If you have Congestive Heart Failure: Please call your Cardiologist or Primary MD anytime you have any of the following symptoms:  1) 3 pound weight gain in 24 hours or 5 pounds in 1 week  2) shortness of breath, with or without a dry hacking cough  3) swelling in the hands, feet or stomach  4) if you have to sleep on extra pillows at night in order to breathe  Follow cardiac low salt diet and 1.5 lit/day fluid restriction.  If you have diabetes Accuchecks 4 times/day, Once in AM empty stomach and then before each meal. Log in all results and show them to your primary doctor at your next visit. If any glucose reading is under 80 or above 300 call your primary MD immediately.  If you have Seizure/Convulsions/Epilepsy: Please do not drive, operate heavy machinery, participate in activities at heights or participate in high speed sports until you have seen by Primary MD or a Neurologist and advised to do so again. Per [facility] statutes, patients with seizures are not allowed to drive until they have been seizure-free for  six months.  Use caution when using heavy equipment or power tools. Avoid working on ladders or at heights. Take showers instead of baths. Ensure the water temperature is not too high on the home water heater. Do not go swimming alone. Do not lock yourself in a room alone (i.e. bathroom). When caring for infants or small children, sit down when holding, feeding, or changing them to minimize risk of injury to the child in the event you have a seizure. Maintain good sleep hygiene. Avoid alcohol.   If you had Gastrointestinal Bleeding: Please ask your Primary MD to check a complete blood count within one week of discharge or at your next visit. Your endoscopic/colonoscopic biopsies that are pending at the time of discharge, will also need to followed by your Primary MD.  Get Medicines reviewed and adjusted. Please take all your medications with you for your next visit with your Primary MD  Please request your Primary MD to go over all hospital tests and procedure/radiological results at the follow up, please ask your Primary MD to get all Hospital records sent to his/her office.  If you experience worsening of your admission symptoms, develop shortness of breath, life threatening emergency, suicidal or homicidal thoughts you must seek medical attention immediately by calling 911 or calling your MD immediately  if symptoms less severe.  You must read complete instructions/literature along with all the possible adverse reactions/side effects for all the Medicines you take  and that have been prescribed to you. Take any new Medicines after you have completely understood and accpet all the possible adverse reactions/side effects.   Do not drive or operate heavy machinery when taking Pain medications.   Do not take more than prescribed Pain, Sleep and Anxiety Medications  Special Instructions: If you have smoked or chewed Tobacco  in the last 2 yrs please stop smoking, stop any regular Alcohol  and or any  Recreational drug use.  Wear Seat belts while driving.  Please note You were cared for by a hospitalist during your hospital stay. If you have any questions about your discharge medications or the care you received while you were in the hospital after you are discharged, you can call the unit and asked to speak with the hospitalist on call if the hospitalist that took care of you is not available. Once you are discharged, your primary care physician will handle any further medical issues. Please note that NO REFILLS for any discharge medications will be authorized once you are discharged, as it is imperative that you return to your primary care physician (or establish a relationship with a primary care physician if you do not have one) for your aftercare needs so that they can reassess your need for medications and monitor your lab values.  You can reach the hospitalist office at phone [phone] or fax [phone]   If you do not have a primary care physician, you can call [phone] for a physician referral.  Activity: As tolerated with Full fall precautions use walker/cane & assistance as needed    Diet: low fat  Disposition Home

## 2019-04-29 NOTE — Plan of Care (Signed)
Independent with ADLS. 

## 2019-04-29 NOTE — TOC Transition Note (Signed)
Transition of Care [facility]) - CM/SW Discharge Note   Patient Details  Name: Caleb Chambers MRN: [id number] Date of Birth: [birth year]  [facility] CM/SW Contact:  Kermit Balo, RN Phone Number: 04/29/2019, 9:49 AM   Clinical Narrative:    Pt discharging home with self care. Pt has hospital f/u, insurance and transportation home.   Final next level of care: Home/Self Care Barriers to Discharge: No Barriers Identified   Patient Goals and CMS Choice        Discharge Placement                       Discharge Plan and Services                                     Social Determinants of Health (SDOH) Interventions     Readmission Risk Interventions No flowsheet data found.

## 2019-04-29 NOTE — Discharge Summary (Signed)
Physician Discharge Summary  Caleb Chambers ZOX:[id number] DOB: 08-27-1965 DOA: 04/20/2019  PCP: Patient, No Pcp Per  Admit date: 04/20/2019 Discharge date: 04/29/2019  Admitted From: home Disposition:  home  Recommendations for Outpatient Follow-up:  1. Follow up with PCP in 1-2 weeks 2. Please obtain BMP/CBC in one week  Home Health: none Equipment/Devices: none  Discharge Condition: stable CODE STATUS: Full code Diet recommendation: low fat  HPI: Per admitting MD, Caleb Chambers is a 54 y.o. male with medical history significant of IDDM, HTN, abdominal pain and vomiting. Started 2-3 days ago after heavy meal. Pain is diffuse across his upper abdomen and severe, cramping like. He has not taken anything specifically for it. He has had 20-30 episodes of vomiting and loose diarrhea as well. No fevers or cough. He stopped taking all his meds, including insulin and the Metformin since yesterday. ED Course: DKA, insulin drip was started. CT showed acute uncomplicated pancreatitis.  Hospital Course / Discharge diagnoses: Acute pancreatitis/mildly elevated LFTs with abdominal pain, nausea, vomiting -Progressing very slowly but eventually improved, hie is able to tolerate a regular diet with some residual pain controlled on oral pain medications. GI consulted and followed patient while hospitalized. He will be discharged home in stable condition and advised to follow up with his PCP in 1-2 weeks. Has had a history of cholecystectomy Dyspnea -Resolved, suspect secondary to volume overload. Received IV Lasix. BNP normanl, no history of CHF DKA, anion gap metabolic acidosis/ Diabetes mellitus, type II -Suspect secondary to the above process, presented with with anion gap of 26, glucose of 870, has now improved and closed, bicarb is also resolved. CBGs stable, resume home medications. Hemoglobin A1c 13.2. Discontinue trulicity as a possible cause for his pancreatitis. .  Sepsis, ruled out   -present on admission, unknown source, possibly secondary to the above, less likely sepsis but more due to his acute pancreatitis.  Essential hypertension -BP currently stable, resume home medications Acute kidney injury -Creatinine peaked to 1.81, now normalized. Repeat labs at PCP within a week Hyponatremia -Sodium is 129 on admission, suspect secondary to DKA, resolved Hypokalemia -Continue to replace and monitor BMP, will be given a short course of K on d/c, advised to follow up with PCP for repeat labs  Discharge Instructions   Allergies as of 04/29/2019      Reactions   Penicillins Anaphylaxis, Hives, Swelling   Did it involve swelling of the face/tongue/throat, SOB, or low BP? Yes Did it involve sudden or severe rash/hives, skin peeling, or any reaction on the inside of your mouth or nose? Yes Did you need to seek medical attention at a hospital or doctor's office? Yes When did it last happen?teenager If all above answers are "NO", may proceed with cephalosporin use.   Clindamycin Other (See Comments)   Unknown reaction - reported by [facility] and [facility] 09-30-13   Metoprolol Other (See Comments)   Chest pains - reported by [facility] and  [facility] 02/08/14      Medication List    STOP taking these medications   lisinopril 10 MG tablet Commonly known as: ZESTRIL   Trulicity 0.75 MG/0.5ML Sopn Generic drug: Dulaglutide     TAKE these medications   allopurinol 300 MG tablet Commonly known as: ZYLOPRIM Take 300 mg by mouth daily.   amLODipine 10 MG tablet Commonly known as: NORVASC Take 10 mg by mouth daily.   atorvastatin 10 MG tablet Commonly known as: LIPITOR Take 10 mg by mouth every  other day.   Basaglar KwikPen 100 UNIT/ML Sopn Inject 5-15 Units into the skin See admin instructions. Take (??) units every other day starting with 15u, 10u, 5u, then repeat trend. (ex. Sun-15, Tue-10, Thur-5, Sat-15.Marland KitchenMarland KitchenMarland KitchenMarland Kitchen)   bumetanide 0.5 MG tablet Commonly known as: BUMEX Take  0.5 mg by mouth daily as needed (fluid).   colchicine 0.6 MG tablet Commonly known as: Colcrys Take 1 tablet (0.6 mg total) by mouth 2 (two) times daily. Needs office visit (second notice) What changed:   when to take this  reasons to take this  additional instructions   Humira Pen 40 MG/0.4ML Pnkt Generic drug: Adalimumab 0.4 mLs by Subdermal route every 14 (fourteen) days.   lisinopril-hydrochlorothiazide 20-12.5 MG tablet Commonly known as: ZESTORETIC Take 2 tablets by mouth daily.   metFORMIN 500 MG 24 hr tablet Commonly known as: GLUCOPHAGE-XR Take 1,000 mg by mouth 2 (two) times daily. What changed: Another medication with the same name was removed. Continue taking this medication, and follow the directions you see here.   oxyCODONE-acetaminophen 5-325 MG tablet Commonly known as: PERCOCET/ROXICET Take 1 tablet by mouth every 4 (four) hours as needed for up to 5 days for moderate pain.   potassium chloride 10 MEQ tablet Commonly known as: KLOR-CON Take 2 tablets (20 mEq total) by mouth daily.   tiZANidine 2 MG tablet Commonly known as: ZANAFLEX Take 2 mg by mouth every 6 (six) hours as needed for muscle spasms.      Follow-up Information    Jeani Hawking, MD. Schedule an appointment as soon as possible for a visit in 2 week(s).   Specialty: Gastroenterology Contact information: [address] [place] [address] [phone]           Consultations:  GI  Procedures/Studies  CT ABDOMEN PELVIS WO CONTRAST  Addendum Date: 04/20/2019   ADDENDUM REPORT: 04/20/2019 13:40 ADDENDUM: The patient does have a midline skin lesion over the mid sternum measuring approximately 4.0 by 1.5 cm of unknown etiology. Electronically Signed   By: Francene Boyers M.D.   On: 04/20/2019 13:40   Result Date: 04/20/2019 CLINICAL DATA:  Upper abdominal pain with nausea and vomiting. Acute pancreatitis. EXAM: CT ABDOMEN AND PELVIS WITHOUT CONTRAST TECHNIQUE:  Multidetector CT imaging of the abdomen and pelvis was performed following the standard protocol without IV contrast. COMPARISON:  None. FINDINGS: Lower chest: No acute abnormality. Tiny calcified granuloma in the right lower lobe. No effusions. Hepatobiliary: Severe hepatic steatosis. Cholecystectomy. No focal liver lesions. No biliary ductal dilatation. Pancreas: Peripancreatic soft tissue edema with edema extending into the mesentery with a small amount of ascites around the spleen and around the inferior aspect of the right lobe of the liver. Spleen: Normal in size without focal abnormality. Adrenals/Urinary Tract: Adrenal glands are unremarkable. Kidneys are normal, without renal calculi, focal lesion, or hydronephrosis. Bladder is unremarkable. Stomach/Bowel: Stomach is within normal limits. Appendix appears normal. No evidence of bowel wall thickening, distention, or inflammatory changes. Vascular/Lymphatic: No significant vascular findings are present. No enlarged abdominal or pelvic lymph nodes. Reproductive: Prostate is unremarkable. Other: There is a small amount of ascites pericolic gutters and adjacent to the spleen and inferior aspect the liver as well as mesenteric edema, all felt pancreatitis. No abdominal wall hernias. Musculoskeletal: No acute or significant osseous findings. IMPRESSION: 1. Acute pancreatitis. 2. Severe hepatic steatosis. 3. Small amount of ascites. Electronically Signed: By: Francene Boyers M.D. On: 04/20/2019 13:14   DG Chest 2 View  Result Date: 04/20/2019 CLINICAL  DATA:  Shortness of breath and dizziness EXAM: CHEST - 2 VIEW COMPARISON:  None. FINDINGS: Lungs are clear. Heart is upper normal in size with pulmonary vascularity normal. No adenopathy. No bone lesions. IMPRESSION: No edema or consolidation.  Heart upper normal in size. Electronically Signed   By: Lowella Grip III M.D.   On: 04/20/2019 10:03   DG CHEST PORT 1 VIEW  Result Date: 04/23/2019 CLINICAL DATA:   Shortness of breath evaluate for pulmonary edema EXAM: PORTABLE CHEST 1 VIEW COMPARISON:  04/20/2019 FINDINGS: Cardiomediastinal contours are stable, accentuated by low lung volumes on today's study and projectional, lordotic type positioning. There is silhouetting of the left hemidiaphragm. Perihilar opacities have developed on the right greater than left since the previous exam. No signs of blunting of costodiaphragmatic sulci. No acute bone finding. IMPRESSION: 1. Low lung volumes with silhouetting of the left hemidiaphragm potentially related to developing volume loss or consolidation in the left lung base. 2. Perihilar opacities have developed on the right greater than left. Findings could reflect asymmetric edema or atypical pneumonia. 3. Stable cardiomediastinal contours accounting for projection and low lung volume. Electronically Signed   By: Zetta Bills M.D.   On: 04/23/2019 08:49     Subjective: - no chest pain, shortness of breath, no abdominal pain, nausea or vomiting.   Discharge Exam: BP (!) 147/81 (BP Location: Right Arm)   Pulse (!) 104   Temp 98.3 F (36.8 C) (Oral)   Resp 18   Ht 5\' 11"  (1.803 m)   Wt (!) 145.2 kg   SpO2 99%   BMI 44.63 kg/m   General: Pt is alert, awake, not in acute distress Cardiovascular: RRR, S1/S2 +, no rubs, no gallops Respiratory: CTA bilaterally, no wheezing, no rhonchi Abdominal: Soft, NT, ND, bowel sounds + Extremities: no edema, no cyanosis    The results of significant diagnostics from this hospitalization (including imaging, microbiology, ancillary and laboratory) are listed below for reference.     Microbiology: Recent Results (from the past 240 hour(s))  Culture, blood (routine x 2)     Status: None   Collection Time: 04/20/19 12:37 PM   Specimen: BLOOD  Result Value Ref Range Status   Specimen Description BLOOD LEFT ANTECUBITAL  Final   Special Requests   Final    BOTTLES DRAWN AEROBIC AND ANAEROBIC Blood Culture results  may not be optimal due to an inadequate volume of blood received in culture bottles   Culture   Final    NO GROWTH 5 DAYS Performed at [facility], [address], [place], [place] [address]    Report Status 04/25/2019 FINAL  Final  Respiratory Panel by RT PCR (Flu A&B, Covid) - Nasopharyngeal Swab     Status: None   Collection Time: 04/20/19  3:53 PM   Specimen: Nasopharyngeal Swab  Result Value Ref Range Status   SARS Coronavirus 2 by RT PCR NEGATIVE NEGATIVE Final    Comment: (NOTE) SARS-CoV-2 target nucleic acids are NOT DETECTED. The SARS-CoV-2 RNA is generally detectable in upper respiratoy specimens during the acute phase of infection. The lowest concentration of SARS-CoV-2 viral copies this assay can detect is 131 copies/mL. A negative result does not preclude SARS-Cov-2 infection and should not be used as the sole basis for treatment or other patient management decisions. A negative result may occur with  improper specimen collection/handling, submission of specimen other than nasopharyngeal swab, presence of viral mutation(s) within the areas targeted by this assay, and inadequate number  of viral copies (<131 copies/mL). A negative result must be combined with clinical observations, patient history, and epidemiological information. The expected result is Negative. Fact Sheet for Patients:  https://www.moore.com/ Fact Sheet for Healthcare Providers:  https://www.young.biz/ This test is not yet ap proved or cleared by the [place] FDA and  has been authorized for detection and/or diagnosis of SARS-CoV-2 by FDA under an Emergency Use Authorization (EUA). This EUA will remain  in effect (meaning this test can be used) for the duration of the COVID-19 declaration under Section 564(b)(1) of the Act, 21 U.S.C. section 360bbb-3(b)(1), unless the authorization is terminated or revoked sooner.    Influenza A by PCR NEGATIVE  NEGATIVE Final   Influenza B by PCR NEGATIVE NEGATIVE Final    Comment: (NOTE) The Xpert Xpress SARS-CoV-2/FLU/RSV assay is intended as an aid in  the diagnosis of influenza from Nasopharyngeal swab specimens and  should not be used as a sole basis for treatment. Nasal washings and  aspirates are unacceptable for Xpert Xpress SARS-CoV-2/FLU/RSV  testing. Fact Sheet for Patients: https://www.moore.com/ Fact Sheet for Healthcare Providers: https://www.young.biz/ This test is not yet approved or cleared by the [place] FDA and  has been authorized for detection and/or diagnosis of SARS-CoV-2 by  FDA under an Emergency Use Authorization (EUA). This EUA will remain  in effect (meaning this test can be used) for the duration of the  Covid-19 declaration under Section 564(b)(1) of the Act, 21  U.S.C. section 360bbb-3(b)(1), unless the authorization is  terminated or revoked. Performed at [facility], [address], [place], [place] [address]      Labs: Basic Metabolic Panel: Recent Labs  Lab 04/24/19 0412 04/26/19 0140 04/27/19 0727 04/28/19 0747 04/28/19 1636 04/29/19 0249  NA 137 135 136 137 134* 134*  K 3.3* 3.8 2.7* 2.8* 2.9* 3.0*  CL 101 101 97* 101 99 99  CO2 23 18* 26 24 22 23   GLUCOSE 194* 171* 204* 247* 237* 208*  BUN 11 8 5* 5* 5* <5*  CREATININE 0.69 0.68 0.73 0.86 0.78 0.75  CALCIUM 7.1* 7.6* 7.9* 8.0* 7.9* 8.0*  MG 1.8  --  1.8 1.7  --  1.7   Liver Function Tests: Recent Labs  Lab 04/23/19 0450 04/24/19 0412 04/25/19 0359  AST 38 29 25  ALT 27 30 30   ALKPHOS 55 56 60  BILITOT 1.2 0.9 0.9  PROT 5.8* 5.7* 5.5*  ALBUMIN 2.3* 2.2* 2.1*   CBC: Recent Labs  Lab 04/24/19 0412 04/25/19 0359 04/26/19 0140 04/27/19 0727 04/29/19 0249  WBC 6.3 6.5 8.9 9.4 11.0*  HGB 10.9* 11.3* 11.4* 11.3* 11.0*  HCT 34.6* 35.1* 35.4* 35.3* 33.3*  MCV 79.9* 78.9* 79.9* 78.1* 76.9*  PLT 194 205 179 257 258    CBG: Recent Labs  Lab 04/28/19 1159 04/28/19 1625 04/28/19 1935 04/28/19 2353 04/29/19 0350  GLUCAP 239* 217* 204* 209* 211*   Hgb A1c No results for input(s): HGBA1C in the last 72 hours. Lipid Profile No results for input(s): CHOL, HDL, LDLCALC, TRIG, CHOLHDL, LDLDIRECT in the last 72 hours. Thyroid function studies No results for input(s): TSH, T4TOTAL, T3FREE, THYROIDAB in the last 72 hours.  Invalid input(s): FREET3 Urinalysis    Component Value Date/Time   COLORURINE YELLOW 04/20/2019 1708   APPEARANCEUR CLEAR 04/20/2019 1708   LABSPEC 1.022 04/20/2019 1708   PHURINE 5.0 04/20/2019 1708   GLUCOSEU >=500 (A) 04/20/2019 1708   HGBUR SMALL (A) 04/20/2019 1708   BILIRUBINUR NEGATIVE 04/20/2019 1708  KETONESUR 5 (A) 04/20/2019 1708   PROTEINUR NEGATIVE 04/20/2019 1708   UROBILINOGEN 0.2 09/12/2018 0949   NITRITE NEGATIVE 04/20/2019 1708   LEUKOCYTESUR NEGATIVE 04/20/2019 1708    FURTHER DISCHARGE INSTRUCTIONS:   Get Medicines reviewed and adjusted: Please take all your medications with you for your next visit with your Primary MD   Laboratory/radiological data: Please request your Primary MD to go over all hospital tests and procedure/radiological results at the follow up, please ask your Primary MD to get all Hospital records sent to his/her office.   In some cases, they will be blood work, cultures and biopsy results pending at the time of your discharge. Please request that your primary care M.D. goes through all the records of your hospital data and follows up on these results.   Also Note the following: If you experience worsening of your admission symptoms, develop shortness of breath, life threatening emergency, suicidal or homicidal thoughts you must seek medical attention immediately by calling 911 or calling your MD immediately  if symptoms less severe.   You must read complete instructions/literature along with all the possible adverse reactions/side  effects for all the Medicines you take and that have been prescribed to you. Take any new Medicines after you have completely understood and accpet all the possible adverse reactions/side effects.    Do not drive when taking Pain medications or sleeping medications (Benzodaizepines)   Do not take more than prescribed Pain, Sleep and Anxiety Medications. It is not advisable to combine anxiety,sleep and pain medications without talking with your primary care practitioner   Special Instructions: If you have smoked or chewed Tobacco  in the last 2 yrs please stop smoking, stop any regular Alcohol  and or any Recreational drug use.   Wear Seat belts while driving.   Please note: You were cared for by a hospitalist during your hospital stay. Once you are discharged, your primary care physician will handle any further medical issues. Please note that NO REFILLS for any discharge medications will be authorized once you are discharged, as it is imperative that you return to your primary care physician (or establish a relationship with a primary care physician if you do not have one) for your post hospital discharge needs so that they can reassess your need for medications and monitor your lab values.  Time coordinating discharge: 40 minutes  SIGNED:  Pamella Pert, MD, PhD 04/29/2019, 7:05 AM

## 2019-05-12 ENCOUNTER — Encounter (HOSPITAL_COMMUNITY): Payer: Self-pay

## 2019-05-12 ENCOUNTER — Other Ambulatory Visit: Payer: Self-pay

## 2019-05-12 ENCOUNTER — Inpatient Hospital Stay (HOSPITAL_COMMUNITY)
Admission: EM | Admit: 2019-05-12 | Discharge: 2019-06-22 | DRG: 405 | Disposition: A | Discharge status: Home Health | Payer: BC Managed Care – PPO | Attending: Student | Admitting: Student

## 2019-05-12 DIAGNOSIS — Z794 Long term (current) use of insulin: Secondary | ICD-10-CM

## 2019-05-12 DIAGNOSIS — E11649 Type 2 diabetes mellitus with hypoglycemia without coma: Secondary | ICD-10-CM | POA: Diagnosis not present

## 2019-05-12 DIAGNOSIS — R1084 Generalized abdominal pain: Secondary | ICD-10-CM | POA: Diagnosis not present

## 2019-05-12 DIAGNOSIS — R5381 Other malaise: Secondary | ICD-10-CM | POA: Diagnosis not present

## 2019-05-12 DIAGNOSIS — I10 Essential (primary) hypertension: Secondary | ICD-10-CM | POA: Diagnosis present

## 2019-05-12 DIAGNOSIS — Z9049 Acquired absence of other specified parts of digestive tract: Secondary | ICD-10-CM

## 2019-05-12 DIAGNOSIS — Z713 Dietary counseling and surveillance: Secondary | ICD-10-CM

## 2019-05-12 DIAGNOSIS — K8582 Other acute pancreatitis with infected necrosis: Secondary | ICD-10-CM | POA: Diagnosis not present

## 2019-05-12 DIAGNOSIS — T380X5A Adverse effect of glucocorticoids and synthetic analogues, initial encounter: Secondary | ICD-10-CM | POA: Diagnosis not present

## 2019-05-12 DIAGNOSIS — R112 Nausea with vomiting, unspecified: Secondary | ICD-10-CM | POA: Diagnosis present

## 2019-05-12 DIAGNOSIS — E876 Hypokalemia: Secondary | ICD-10-CM | POA: Diagnosis present

## 2019-05-12 DIAGNOSIS — M25562 Pain in left knee: Secondary | ICD-10-CM | POA: Diagnosis not present

## 2019-05-12 DIAGNOSIS — R633 Feeding difficulties: Secondary | ICD-10-CM | POA: Diagnosis present

## 2019-05-12 DIAGNOSIS — E46 Unspecified protein-calorie malnutrition: Secondary | ICD-10-CM

## 2019-05-12 DIAGNOSIS — K8689 Other specified diseases of pancreas: Secondary | ICD-10-CM

## 2019-05-12 DIAGNOSIS — Z4659 Encounter for fitting and adjustment of other gastrointestinal appliance and device: Secondary | ICD-10-CM

## 2019-05-12 DIAGNOSIS — K859 Acute pancreatitis without necrosis or infection, unspecified: Secondary | ICD-10-CM | POA: Diagnosis not present

## 2019-05-12 DIAGNOSIS — J029 Acute pharyngitis, unspecified: Secondary | ICD-10-CM | POA: Diagnosis not present

## 2019-05-12 DIAGNOSIS — Z6841 Body Mass Index (BMI) 40.0 and over, adult: Secondary | ICD-10-CM

## 2019-05-12 DIAGNOSIS — D509 Iron deficiency anemia, unspecified: Secondary | ICD-10-CM | POA: Diagnosis present

## 2019-05-12 DIAGNOSIS — M25511 Pain in right shoulder: Secondary | ICD-10-CM | POA: Diagnosis present

## 2019-05-12 DIAGNOSIS — E43 Unspecified severe protein-calorie malnutrition: Secondary | ICD-10-CM | POA: Diagnosis not present

## 2019-05-12 DIAGNOSIS — R410 Disorientation, unspecified: Secondary | ICD-10-CM | POA: Diagnosis present

## 2019-05-12 DIAGNOSIS — E860 Dehydration: Secondary | ICD-10-CM | POA: Diagnosis present

## 2019-05-12 DIAGNOSIS — R519 Headache, unspecified: Secondary | ICD-10-CM | POA: Diagnosis not present

## 2019-05-12 DIAGNOSIS — Z1611 Resistance to penicillins: Secondary | ICD-10-CM | POA: Diagnosis present

## 2019-05-12 DIAGNOSIS — R109 Unspecified abdominal pain: Secondary | ICD-10-CM

## 2019-05-12 DIAGNOSIS — K3189 Other diseases of stomach and duodenum: Secondary | ICD-10-CM | POA: Diagnosis not present

## 2019-05-12 DIAGNOSIS — E871 Hypo-osmolality and hyponatremia: Secondary | ICD-10-CM | POA: Diagnosis not present

## 2019-05-12 DIAGNOSIS — E1165 Type 2 diabetes mellitus with hyperglycemia: Secondary | ICD-10-CM | POA: Diagnosis not present

## 2019-05-12 DIAGNOSIS — K209 Esophagitis, unspecified without bleeding: Secondary | ICD-10-CM | POA: Diagnosis not present

## 2019-05-12 DIAGNOSIS — R197 Diarrhea, unspecified: Secondary | ICD-10-CM | POA: Diagnosis not present

## 2019-05-12 DIAGNOSIS — K862 Cyst of pancreas: Secondary | ICD-10-CM | POA: Diagnosis not present

## 2019-05-12 DIAGNOSIS — Z881 Allergy status to other antibiotic agents status: Secondary | ICD-10-CM

## 2019-05-12 DIAGNOSIS — Z888 Allergy status to other drugs, medicaments and biological substances status: Secondary | ICD-10-CM

## 2019-05-12 DIAGNOSIS — L729 Follicular cyst of the skin and subcutaneous tissue, unspecified: Secondary | ICD-10-CM | POA: Diagnosis present

## 2019-05-12 DIAGNOSIS — E1110 Type 2 diabetes mellitus with ketoacidosis without coma: Secondary | ICD-10-CM | POA: Diagnosis present

## 2019-05-12 DIAGNOSIS — D638 Anemia in other chronic diseases classified elsewhere: Secondary | ICD-10-CM | POA: Diagnosis present

## 2019-05-12 DIAGNOSIS — M109 Gout, unspecified: Secondary | ICD-10-CM | POA: Diagnosis present

## 2019-05-12 DIAGNOSIS — R1013 Epigastric pain: Secondary | ICD-10-CM | POA: Diagnosis not present

## 2019-05-12 DIAGNOSIS — E1310 Other specified diabetes mellitus with ketoacidosis without coma: Secondary | ICD-10-CM | POA: Diagnosis not present

## 2019-05-12 DIAGNOSIS — Z880 Allergy status to penicillin: Secondary | ICD-10-CM

## 2019-05-12 DIAGNOSIS — K863 Pseudocyst of pancreas: Secondary | ICD-10-CM | POA: Diagnosis not present

## 2019-05-12 DIAGNOSIS — I959 Hypotension, unspecified: Secondary | ICD-10-CM | POA: Diagnosis present

## 2019-05-12 DIAGNOSIS — G4700 Insomnia, unspecified: Secondary | ICD-10-CM | POA: Diagnosis not present

## 2019-05-12 DIAGNOSIS — R000 Tachycardia, unspecified: Secondary | ICD-10-CM | POA: Diagnosis not present

## 2019-05-12 DIAGNOSIS — K2100 Gastro-esophageal reflux disease with esophagitis, without bleeding: Secondary | ICD-10-CM | POA: Diagnosis present

## 2019-05-12 DIAGNOSIS — D649 Anemia, unspecified: Secondary | ICD-10-CM | POA: Diagnosis not present

## 2019-05-12 DIAGNOSIS — B9561 Methicillin susceptible Staphylococcus aureus infection as the cause of diseases classified elsewhere: Secondary | ICD-10-CM | POA: Diagnosis present

## 2019-05-12 DIAGNOSIS — R600 Localized edema: Secondary | ICD-10-CM | POA: Diagnosis not present

## 2019-05-12 DIAGNOSIS — R1010 Upper abdominal pain, unspecified: Secondary | ICD-10-CM | POA: Diagnosis not present

## 2019-05-12 DIAGNOSIS — Z9114 Patient's other noncompliance with medication regimen: Secondary | ICD-10-CM

## 2019-05-12 DIAGNOSIS — N179 Acute kidney failure, unspecified: Secondary | ICD-10-CM | POA: Diagnosis not present

## 2019-05-12 DIAGNOSIS — Z789 Other specified health status: Secondary | ICD-10-CM | POA: Diagnosis not present

## 2019-05-12 DIAGNOSIS — Z79899 Other long term (current) drug therapy: Secondary | ICD-10-CM

## 2019-05-12 DIAGNOSIS — K851 Biliary acute pancreatitis without necrosis or infection: Secondary | ICD-10-CM | POA: Diagnosis not present

## 2019-05-12 DIAGNOSIS — I872 Venous insufficiency (chronic) (peripheral): Secondary | ICD-10-CM | POA: Diagnosis present

## 2019-05-12 DIAGNOSIS — Z20822 Contact with and (suspected) exposure to covid-19: Secondary | ICD-10-CM | POA: Diagnosis present

## 2019-05-12 DIAGNOSIS — Z8249 Family history of ischemic heart disease and other diseases of the circulatory system: Secondary | ICD-10-CM

## 2019-05-12 DIAGNOSIS — R935 Abnormal findings on diagnostic imaging of other abdominal regions, including retroperitoneum: Secondary | ICD-10-CM | POA: Diagnosis not present

## 2019-05-12 DIAGNOSIS — K2980 Duodenitis without bleeding: Secondary | ICD-10-CM | POA: Diagnosis present

## 2019-05-12 HISTORY — DX: Essential (primary) hypertension: I10

## 2019-05-12 HISTORY — DX: Type 2 diabetes mellitus with ketoacidosis without coma: E11.10

## 2019-05-12 HISTORY — DX: Acute pancreatitis without necrosis or infection, unspecified: K85.90

## 2019-05-12 LAB — CBC
HCT: 33.4 % — ABNORMAL LOW (ref 39.0–52.0)
Hemoglobin: 10.7 g/dL — ABNORMAL LOW (ref 13.0–17.0)
MCH: 24.7 pg — ABNORMAL LOW (ref 26.0–34.0)
MCHC: 32 g/dL (ref 30.0–36.0)
MCV: 77.1 fL — ABNORMAL LOW (ref 80.0–100.0)
Platelets: 282 10*3/uL (ref 150–400)
RBC: 4.33 MIL/uL (ref 4.22–5.81)
RDW: 14.9 % (ref 11.5–15.5)
WBC: 6.4 10*3/uL (ref 4.0–10.5)
nRBC: 0 % (ref 0.0–0.2)

## 2019-05-12 LAB — BASIC METABOLIC PANEL
Anion gap: 22 — ABNORMAL HIGH (ref 5–15)
Anion gap: 16 — ABNORMAL HIGH (ref 5–15)
Anion gap: 14 (ref 5–15)
Anion gap: 12 (ref 5–15)
BUN: 58 mg/dL — ABNORMAL HIGH (ref 6–20)
BUN: 55 mg/dL — ABNORMAL HIGH (ref 6–20)
BUN: 51 mg/dL — ABNORMAL HIGH (ref 6–20)
BUN: 46 mg/dL — ABNORMAL HIGH (ref 6–20)
CO2: 14 mmol/L — ABNORMAL LOW (ref 22–32)
CO2: 15 mmol/L — ABNORMAL LOW (ref 22–32)
CO2: 19 mmol/L — ABNORMAL LOW (ref 22–32)
CO2: 20 mmol/L — ABNORMAL LOW (ref 22–32)
Calcium: 11.5 mg/dL — ABNORMAL HIGH (ref 8.9–10.3)
Calcium: 11.5 mg/dL — ABNORMAL HIGH (ref 8.9–10.3)
Calcium: 11.9 mg/dL — ABNORMAL HIGH (ref 8.9–10.3)
Calcium: 11.3 mg/dL — ABNORMAL HIGH (ref 8.9–10.3)
Chloride: 88 mmol/L — ABNORMAL LOW (ref 98–111)
Chloride: 97 mmol/L — ABNORMAL LOW (ref 98–111)
Chloride: 98 mmol/L (ref 98–111)
Chloride: 97 mmol/L — ABNORMAL LOW (ref 98–111)
Creatinine, Ser: 2.82 mg/dL — ABNORMAL HIGH (ref 0.61–1.24)
Creatinine, Ser: 2.4 mg/dL — ABNORMAL HIGH (ref 0.61–1.24)
Creatinine, Ser: 2.05 mg/dL — ABNORMAL HIGH (ref 0.61–1.24)
Creatinine, Ser: 1.67 mg/dL — ABNORMAL HIGH (ref 0.61–1.24)
GFR calc Af Amer: 29 mL/min — ABNORMAL LOW (ref >60)
GFR calc Af Amer: 35 mL/min — ABNORMAL LOW (ref >60)
GFR calc Af Amer: 42 mL/min — ABNORMAL LOW (ref >60)
GFR calc Af Amer: 54 mL/min — ABNORMAL LOW (ref >60)
GFR calc non Af Amer: 25 mL/min — ABNORMAL LOW (ref >60)
GFR calc non Af Amer: 30 mL/min — ABNORMAL LOW (ref >60)
GFR calc non Af Amer: 36 mL/min — ABNORMAL LOW (ref >60)
GFR calc non Af Amer: 46 mL/min — ABNORMAL LOW (ref >60)
Glucose, Bld: 607 mg/dL (ref 70–99)
Glucose, Bld: 329 mg/dL — ABNORMAL HIGH (ref 70–99)
Glucose, Bld: 221 mg/dL — ABNORMAL HIGH (ref 70–99)
Glucose, Bld: 198 mg/dL — ABNORMAL HIGH (ref 70–99)
Potassium: 3.5 mmol/L (ref 3.5–5.1)
Potassium: 2.9 mmol/L — ABNORMAL LOW (ref 3.5–5.1)
Potassium: 3 mmol/L — ABNORMAL LOW (ref 3.5–5.1)
Potassium: 2.9 mmol/L — ABNORMAL LOW (ref 3.5–5.1)
Sodium: 124 mmol/L — ABNORMAL LOW (ref 135–145)
Sodium: 128 mmol/L — ABNORMAL LOW (ref 135–145)
Sodium: 131 mmol/L — ABNORMAL LOW (ref 135–145)
Sodium: 129 mmol/L — ABNORMAL LOW (ref 135–145)

## 2019-05-12 LAB — COMPREHENSIVE METABOLIC PANEL
ALT: 17 U/L (ref 0–44)
AST: 15 U/L (ref 15–41)
Albumin: 1.9 g/dL — ABNORMAL LOW (ref 3.5–5.0)
Alkaline Phosphatase: 81 U/L (ref 38–126)
Anion gap: 22 — ABNORMAL HIGH (ref 5–15)
BUN: 57 mg/dL — ABNORMAL HIGH (ref 6–20)
CO2: 14 mmol/L — ABNORMAL LOW (ref 22–32)
Calcium: 11.6 mg/dL — ABNORMAL HIGH (ref 8.9–10.3)
Chloride: 87 mmol/L — ABNORMAL LOW (ref 98–111)
Creatinine, Ser: 2.96 mg/dL — ABNORMAL HIGH (ref 0.61–1.24)
GFR calc Af Amer: 27 mL/min — ABNORMAL LOW (ref >60)
GFR calc non Af Amer: 23 mL/min — ABNORMAL LOW (ref >60)
Glucose, Bld: 609 mg/dL (ref 70–99)
Potassium: 3.6 mmol/L (ref 3.5–5.1)
Sodium: 123 mmol/L — ABNORMAL LOW (ref 135–145)
Total Bilirubin: 1.3 mg/dL — ABNORMAL HIGH (ref 0.3–1.2)
Total Protein: 6.9 g/dL (ref 6.5–8.1)

## 2019-05-12 LAB — CBG MONITORING, ED
Glucose-Capillary: 584 mg/dL (ref 70–99)
Glucose-Capillary: 576 mg/dL (ref 70–99)
Glucose-Capillary: 583 mg/dL (ref 70–99)
Glucose-Capillary: 519 mg/dL (ref 70–99)
Glucose-Capillary: 425 mg/dL — ABNORMAL HIGH (ref 70–99)
Glucose-Capillary: 373 mg/dL — ABNORMAL HIGH (ref 70–99)

## 2019-05-12 LAB — GLUCOSE, CAPILLARY
Glucose-Capillary: 333 mg/dL — ABNORMAL HIGH (ref 70–99)
Glucose-Capillary: 271 mg/dL — ABNORMAL HIGH (ref 70–99)

## 2019-05-12 LAB — BETA-HYDROXYBUTYRIC ACID
Beta-Hydroxybutyric Acid: 6.05 mmol/L — ABNORMAL HIGH (ref 0.05–0.27)
Beta-Hydroxybutyric Acid: 1.68 mmol/L — ABNORMAL HIGH (ref 0.05–0.27)

## 2019-05-12 LAB — LIPASE, BLOOD: Lipase: 49 U/L (ref 11–51)

## 2019-05-12 LAB — SARS CORONAVIRUS 2 (TAT 6-24 HRS): SARS Coronavirus 2: NEGATIVE

## 2019-05-12 MED ORDER — DEXTROSE-NACL 5-0.45 % IV SOLN: INTRAVENOUS | Status: DC

## 2019-05-12 MED ORDER — SODIUM CHLORIDE 0.9 % IV BOLUS
1000.0000 mL | Freq: Once | INTRAVENOUS | Status: AC
  Administered 2019-05-12: 1000 mL via INTRAVENOUS

## 2019-05-12 MED ORDER — POTASSIUM CHLORIDE 10 MEQ/100ML IV SOLN
10.0000 meq | INTRAVENOUS | Status: AC
  Administered 2019-05-12 (×2): 10 meq via INTRAVENOUS
  Filled 2019-05-12 (×2): qty 100

## 2019-05-12 MED ORDER — PROMETHAZINE HCL 25 MG RE SUPP
25.0000 mg | Freq: Four times a day (QID) | RECTAL | Status: DC | PRN
  Administered 2019-05-12: 25 mg via RECTAL
  Filled 2019-05-12 (×3): qty 1

## 2019-05-12 MED ORDER — ONDANSETRON HCL 4 MG/2ML IJ SOLN
4.0000 mg | Freq: Four times a day (QID) | INTRAMUSCULAR | Status: AC | PRN
  Administered 2019-05-12 – 2019-06-05 (×15): 4 mg via INTRAVENOUS
  Filled 2019-05-12 (×15): qty 2

## 2019-05-12 MED ORDER — ACETAMINOPHEN 325 MG PO TABS
650.0000 mg | ORAL_TABLET | Freq: Four times a day (QID) | ORAL | Status: DC | PRN
  Administered 2019-05-24 – 2019-06-21 (×13): 650 mg via ORAL
  Filled 2019-05-12 (×14): qty 2

## 2019-05-12 MED ORDER — INSULIN REGULAR(HUMAN) IN NACL 100-0.9 UT/100ML-% IV SOLN
INTRAVENOUS | Status: DC
  Administered 2019-05-12 (×2): 11.5 [IU]/h via INTRAVENOUS
  Administered 2019-05-13: 8.5 [IU]/h via INTRAVENOUS
  Administered 2019-05-13: 7.5 [IU]/h via INTRAVENOUS
  Administered 2019-05-14: 1 [IU]/h via INTRAVENOUS
  Administered 2019-05-14: 9.5 [IU]/h via INTRAVENOUS
  Filled 2019-05-12 (×6): qty 100

## 2019-05-12 MED ORDER — SODIUM CHLORIDE 0.9 % IV BOLUS: 1000.0000 mL | INTRAVENOUS | Status: AC

## 2019-05-12 MED ORDER — DEXTROSE 50 % IV SOLN
0.0000 mL | INTRAVENOUS | Status: DC | PRN
  Filled 2019-05-12: qty 50

## 2019-05-12 MED ORDER — SODIUM CHLORIDE 0.9% FLUSH
3.0000 mL | Freq: Once | INTRAVENOUS | Status: AC
  Administered 2019-05-12: 3 mL via INTRAVENOUS

## 2019-05-12 MED ORDER — ALLOPURINOL 300 MG PO TABS
300.0000 mg | ORAL_TABLET | Freq: Every day | ORAL | Status: DC
  Administered 2019-05-15 – 2019-06-22 (×30): 300 mg via ORAL
  Filled 2019-05-12 (×35): qty 1

## 2019-05-12 MED ORDER — ENOXAPARIN SODIUM 40 MG/0.4ML ~~LOC~~ SOLN
40.0000 mg | SUBCUTANEOUS | Status: DC
  Administered 2019-05-12 – 2019-05-26 (×15): 40 mg via SUBCUTANEOUS
  Filled 2019-05-12 (×15): qty 0.4

## 2019-05-12 MED ORDER — FAMOTIDINE 20 MG PO TABS
40.0000 mg | ORAL_TABLET | Freq: Every day | ORAL | Status: DC
  Filled 2019-05-12: qty 2

## 2019-05-12 MED ORDER — SODIUM CHLORIDE 0.9 % IV SOLN: INTRAVENOUS | Status: DC

## 2019-05-12 MED ORDER — ONDANSETRON HCL 4 MG/2ML IJ SOLN
4.0000 mg | Freq: Once | INTRAMUSCULAR | Status: AC
  Administered 2019-05-12: 11:00:00 4 mg via INTRAVENOUS
  Filled 2019-05-12: qty 2

## 2019-05-12 MED ORDER — POTASSIUM CHLORIDE 10 MEQ/100ML IV SOLN
10.0000 meq | INTRAVENOUS | Status: AC
  Administered 2019-05-12 (×4): 10 meq via INTRAVENOUS
  Filled 2019-05-12 (×3): qty 100

## 2019-05-12 MED ORDER — PROCHLORPERAZINE EDISYLATE 10 MG/2ML IJ SOLN
10.0000 mg | Freq: Four times a day (QID) | INTRAMUSCULAR | Status: DC | PRN
  Administered 2019-05-13: 10 mg via INTRAVENOUS
  Filled 2019-05-12 (×2): qty 2

## 2019-05-12 NOTE — Progress Notes (Signed)
Vomited to a bile vomitus in small amount. zofran iv given. Dozing off after few min. Continue to monitor.

## 2019-05-12 NOTE — Progress Notes (Signed)
Admission from the ED awake and alert. Insulin gtt infusing.

## 2019-05-12 NOTE — H&P (Signed)
History and Physical    Caleb Chambers DDU:[id number] DOB: [birth year] DOA: 05/12/2019  PCP: Patient, No Pcp Per Consultants:  None Patient coming from:  Home - lives with mother; NOK: Mother, Caleb Chambers, [phone]  Chief Complaint: DKA  HPI: Caleb Chambers is a 54 y.o. male with medical history significant of HTN; DM; gout; and recent admission for pancreatitis and DKA (1/4-13) presenting with minimal PO since d/c, now in DKA.  He reports SOB when he is laying certain ways.  He hasn't been able to eat and drink since d/c.  Taking insulin sometimes.  Pancreatitis is better but has some ongoing abdominal pain.  No fever.  No energy.   ED Course: DKA - started Endotool.  Low BP on admission.   Review of Systems: As per HPI; otherwise review of systems reviewed and negative.  Limited by decreased MS in the setting of DKA.  Ambulatory Status:  Ambulates minimally since last hospitalization  Past Medical History:  Diagnosis Date  . Diabetes mellitus without complication (HCC)   . DKA (diabetic ketoacidoses) (HCC) 05/12/2019  . Gout   . Hypertension   . Pancreatitis     Past Surgical History:  Procedure Laterality Date  . CHOLECYSTECTOMY      Social History   Socioeconomic History  . Marital status: Single    Spouse name: Not on file  . Number of children: Not on file  . Years of education: Not on file  . Highest education level: Not on file  Occupational History  . Not on file  Tobacco Use  . Smoking status: Never Smoker  . Smokeless tobacco: Never Used  Substance and Sexual Activity  . Alcohol use: Not Currently  . Drug use: Not on file  . Sexual activity: Not Currently  Other Topics Concern  . Not on file  Social History Narrative  . Not on file   Social Determinants of Health   Financial Resource Strain:   . Difficulty of Paying Living Expenses: Not on file  Food Insecurity:   . Worried About Programme researcher, broadcasting/film/video in the Last Year: Not on file  .  Ran Out of Food in the Last Year: Not on file  Transportation Needs:   . Lack of Transportation (Medical): Not on file  . Lack of Transportation (Non-Medical): Not on file  Physical Activity:   . Days of Exercise per Week: Not on file  . Minutes of Exercise per Session: Not on file  Stress:   . Feeling of Stress : Not on file  Social Connections:   . Frequency of Communication with Friends and Family: Not on file  . Frequency of Social Gatherings with Friends and Family: Not on file  . Attends Religious Services: Not on file  . Active Member of Clubs or Organizations: Not on file  . Attends Banker Meetings: Not on file  . Marital Status: Not on file  Intimate Partner Violence:   . Fear of Current or Ex-Partner: Not on file  . Emotionally Abused: Not on file  . Physically Abused: Not on file  . Sexually Abused: Not on file    Allergies  Allergen Reactions  . Penicillins Anaphylaxis, Hives and Swelling    Did it involve swelling of the face/tongue/throat, SOB, or low BP? Yes Did it involve sudden or severe rash/hives, skin peeling, or any reaction on the inside of your mouth or nose? Yes Did you need to seek medical attention at a hospital or doctor's  office? Yes When did it last happen?teenager If all above answers are "NO", may proceed with cephalosporin use.  . Metoprolol Other (See Comments)    Chest pains - [facility] by [facility] and  [facility] 02/08/14  . Clindamycin Rash and Other (See Comments)    Unknown reaction - reported by [facility] and [facility] 09-30-13    Family History  Problem Relation Age of Onset  . Healthy Mother   . Hypertension Father     Prior to Admission medications   Medication Sig Start Date End Date Taking? Authorizing Provider  allopurinol (ZYLOPRIM) 300 MG tablet Take 300 mg by mouth daily.    Yes [provider]  amLODipine (NORVASC) 10 MG tablet Take 10 mg by mouth daily. 11/30/16  Yes [provider]  bumetanide  (BUMEX) 0.5 MG tablet Take 0.5 mg by mouth daily as needed (fluid).    Yes [provider]  colchicine (COLCRYS) 0.6 MG tablet Take 1 tablet (0.6 mg total) by mouth 2 (two) times daily. Needs office visit (second notice) Patient taking differently: Take 0.6 mg by mouth daily as needed (gout flare up).  10/30/11  Yes Marte, Heather M, PA-C  famotidine (PEPCID) 40 MG tablet Take 40 mg by mouth daily. 05/07/19  Yes [provider]  HUMIRA PEN 40 MG/0.4ML PNKT 0.4 mLs by Subdermal route every 14 (fourteen) days. 02/09/19  Yes [provider]  Insulin Glargine (BASAGLAR KWIKPEN) 100 UNIT/ML SOPN Inject 5-15 Units into the skin See admin instructions. Take (??) units every other day starting with 15u, 10u, 5u, then repeat trend. (ex. Sun-15, Tue-10, Thur-5, Sat-15.[name]...)   Yes [provider]  lisinopril-hydrochlorothiazide (ZESTORETIC) 20-12.5 MG tablet Take 2 tablets by mouth daily. 07/16/16  Yes [provider]  metFORMIN (GLUCOPHAGE-XR) 500 MG 24 hr tablet Take 1,000 mg by mouth 2 (two) times daily.   Yes [provider]  potassium chloride (KLOR-CON) 10 MEQ tablet Take 2 tablets (20 mEq total) by mouth daily. 04/29/19  Yes Caleb Griffins, MD    Physical Exam: Vitals:   05/12/19 1535 05/12/19 1615 05/12/19 1645 05/12/19 1700  BP:  96/71 100/67 (!) 93/52  Pulse:  (!) 102  (!) 105  Resp:  (!) 23 (!) 22 (!) 34  Temp: 98.6 F (37 C)     TempSrc: Oral     SpO2:  100% 98% 99%  Weight:      Height:         . General:  Appears calm and somnolent . Eyes:  PERRL, EOMI, normal lids, iris . ENT:  grossly normal hearing, lips & tongue, mildly dry mm; appropriate dentition . Neck:  no LAD, masses or thyromegaly . Cardiovascular:  RRR, no m/r/g. No LE edema.  [name] Respiratory:   CTA bilaterally with no wheezes/rales/rhonchi.  Normal respiratory effort. . Abdomen:  soft, RUQ TTP, ND, NABS . Skin:  no rash or induration seen on limited  exam . Musculoskeletal:  grossly normal tone BUE/BLE, good ROM, no bony abnormality . Psychiatric:  somnolent mood and affect, speech sparse but appropriate, AOx3 . Neurologic:  CN 2-12 grossly intact, moves all extremities in coordinated fashion    Radiological Exams on Admission: No results found.  EKG: Independently reviewed.  NSR with rate 98; low voltage with no evidence of acute ischemia   Labs on Admission: I have personally reviewed the available labs and imaging studies at the time of the admission.  Pertinent labs:   Na++ 123 -> 129 CO2  14 -> 15 Glucose 609, 607, 329 BUN 57/Creatinine 2.96/GFR 23; >5/0.75/>60 on 1/13 -> 55/2.40/35 Calcium 11.6 -> 11.5 Anion gap 22 -> 16 WBC 6.4 Hgb 10.7 Beta-hydroxybutyrate 6.05 COVID pending   Assessment/Plan Principal Problem:   DKA (diabetic ketoacidosis) (HCC) Active Problems:   Acute pancreatitis   Acute kidney injury (HCC)   Morbid obesity (HCC)   Debility   DKA -Patient with poor baseline control (A1c was 13.2 on 1/6) -Glycemic control was improved at the time of d/c from his last hospitalization -No indication of illness as source -He has simply not been eating, drinking, taking insulin, or moving around since his recent discharge -Moderate DKA on admission based on CO2 14, anion gap > 12, patient drowsy -Will admit to SDU with DKA protocol -Would recommend continuing insulin drip at least until morning regardless of rapidity of closure of gap and normalization of labs -K+ repeatedly low and so potassium supplementation added -IVF at 150 cc/hr, NS until glucose <250 and then decrease rate to 125 and change to D51/2NS  AKI -Likely due to prerenal failure secondary to dehydration in the setting of DKA and continuation of ACEI, diruetics. -Hold diuretics for now -IVF as above -Follow up renal function by BMP -Avoid ACEI and NSAIDs as well as other nephrotoxic agents  HTN -Low BP due to volume depletion -Hold  Norvasc for now -Hold Zestoretic due to AKI  Morbid obesity -BMI 43.4 -Weight loss should be encouraged  Debility -He has had difficulty eating since the time of d/c and limited mobility with progressively worsening weakness -Appears likely to need PT/OT starting tomorrow or once more medically stable -He may require placement  H/o Pancreatitis -Recent admission for pancreatitis -Reports improved symptoms, RUQ abdominal pain on exam    Note: This patient has been tested and is pending for the novel coronavirus COVID-19.  DVT prophylaxis:  Lovenox  Code Status:  Full - confirmed with prior hospitalization Family Communication: None present; I was unable to reach his mother by telephone Disposition Plan:  Home once clinically improved Consults called: DM Coordinator; PT/OT, nutrition  Admission status: Admit - It is my clinical opinion that admission to INPATIENT is reasonable and necessary because of the expectation that this patient will require hospital care that crosses at least 2 midnights to treat this condition based on the medical complexity of the problems presented.  Given the aforementioned information, the predictability of an adverse outcome is felt to be significant.    Jonah Blue MD [facility]   How to contact the [facility] Attending or Consulting provider 7A - 7P or covering provider during after hours 7P -7A, for this patient?  1. Check the care team in [facility] and look for a) attending/consulting [facility] provider listed and b) the [facility] team listed 2. Log into [email or web address] and use [place]'s universal password to access. If you do not have the password, please contact the hospital operator. 3. Locate the [facility] provider you are looking for under [facility] and page to a number that you can be directly reached. 4. If you still have difficulty reaching the provider, please page the [facility] (Director on Call) for the Hospitalists listed on amion for  assistance.   05/12/2019, 5:47 PM

## 2019-05-12 NOTE — ED Notes (Signed)
Pt aware of need for urine sample, urinal at bedside. Call button within reach. Resting and calm in bed. A&Ox4

## 2019-05-12 NOTE — Progress Notes (Signed)
Subjective: See below.  Objective: Vital signs in last 24 hours: Temp:  [97.9 F (36.6 C)] 97.9 F (36.6 C) (01/26 1004) Pulse Rate:  [95-107] 107 (01/26 1245) Resp:  [20-35] 35 (01/26 1245) BP: (79-113)/(38-74) 104/70 (01/26 1245) SpO2:  [97 %-100 %] 97 % (01/26 1245) Weight:  [145.2 kg] 145.2 kg (01/26 0946)    Intake/Output from previous day: No intake/output data recorded. Intake/Output this shift: Total I/O In: 1100 [IV Piggyback:1100] Out: -   General appearance: moderate distress and soft spoken Resp: clear to auscultation bilaterally Cardio: regular rate and rhythm GI: soft, non-tender; bowel sounds normal; no masses,  no organomegaly Extremities: extremities normal, atraumatic, no cyanosis or edema  Lab Results: Recent Labs    05/12/19 0950  WBC 6.4  HGB 10.7*  HCT 33.4*  PLT 282   BMET Recent Labs    05/12/19 0953 05/12/19 1137  NA 123* 124*  K 3.6 3.5  CL 87* 88*  CO2 14* 14*  GLUCOSE 609* 607*  BUN 57* 58*  CREATININE 2.96* 2.82*  CALCIUM 11.6* 11.5*   LFT Recent Labs    05/12/19 0953  PROT 6.9  ALBUMIN 1.9*  AST 15  ALT 17  ALKPHOS 81  BILITOT 1.3*   PT/INR No results for input(s): LABPROT, INR in the last 72 hours. Hepatitis Panel No results for input(s): HEPBSAG, HCVAB, HEPAIGM, HEPBIGM in the last 72 hours. C-Diff No results for input(s): CDIFFTOX in the last 72 hours. Fecal Lactopherrin No results for input(s): FECLLACTOFRN in the last 72 hours.  Studies/Results: No results found.  Medications:  Scheduled: . allopurinol  300 mg Oral Daily  . famotidine  40 mg Oral Daily   Continuous: . sodium chloride    . dextrose 5 % and 0.45% NaCl    . insulin 12 Units/hr (05/12/19 1309)  . potassium chloride 10 mEq (05/12/19 1253)  . sodium chloride      Assessment/Plan: 1) DKA. 2) Weight loss. 3) Fatigue. 4) AKI. 5) Dehydration.   I have been following the patient closely as an outpatient.  Originally, I counseled him  about leaving the hospital, but he needed to take care of bills and his dogs.  His clinical status has rapidly declined since his hospitalization.  He had persistent nausea and vomiting, but it appeared that his pancreatitis resolved.  The patient did not have any pain, but he was unable to tolerate any PO.  His blood glucose was never under good control while he was in the hospital and it only worsened as an outpatient.  After a phone update yesterday he sounded much more poorly and he was instructed to present to the ER yesterday.  He delayed his admission as he stated that he needed to pay bills.  Plan: 1) Treat his DKA, per hospitalist. 2) IV hydration. 3) Monitor creatinine and electrolytes. 4) NPO for now.     LOS: 0 days   Jaideep Pollack D 05/12/2019, 1:21 PM

## 2019-05-12 NOTE — Progress Notes (Signed)
Inpatient Diabetes Program Recommendations  AACE/ADA: New Consensus Statement on Inpatient Glycemic Control (2015)  Target Ranges:  Prepandial:   less than 140 mg/dL      Peak postprandial:   less than 180 mg/dL (1-2 hours)      Critically ill patients:  140 - 180 mg/dL   Lab Results  Component Value Date   GLUCAP 519 (HH) 05/12/2019   HGBA1C 13.2 (H) 04/22/2019    Review of Glycemic Control Results for Caleb Chambers, Caleb Chambers (MRN [id number]) as of 05/12/2019 13:08  Ref. Range 05/12/2019 09:45 05/12/2019 11:40 05/12/2019 12:26 05/12/2019 13:02  Glucose-Capillary Latest Ref Range: 70 - 99 mg/dL 449 (HH) 675 (HH) 916 (HH) 519 (HH)   Diabetes history: DM - Outpatient Diabetes medications:  Basaglar 20 units daily, Metformin 1000 mg bid Current orders for Inpatient glycemic control:  IV insulin/DKA order set Inpatient Diabetes Program Recommendations:    Note patient was d/c'd from hospital on 1/13.  Per MD note, he has not felt well since and was not taking his insulin.  Beta Hydroxybutyrate elevated and DKA order set started.  Will need to remain on insulin drip until acidosis cleared (AG< or equal to 12 or Beta Hydroxy. <0.5).  When patient is ready for transition off insulin drip, will likely need Lantus 18 units 1-2 hours prior to stopping IV insulin.  Will follow patient and speak with him regarding sick day rules when appropriate.   Thanks,  Beryl Meager, RN, BC-ADM Inpatient Diabetes Coordinator Pager [phone] (8a-5p)

## 2019-05-12 NOTE — ED Notes (Signed)
Admitting MD paged concerning pt N/V and need for covid swab orders

## 2019-05-12 NOTE — ED Triage Notes (Signed)
Per [facility] pt from home, pt with weakness, dc from here 1 week ago for pancreatitis, increase weakness since his dc. unable to ambulate d/t weakness.  Pt c/o N/V, decrease appetite, not taking his insulin, pt states he hasn't eaten in 8 days    20g LAC  350 cc NS  BP 85/48 HR 106 RR 30 98% RA  ETCO2 22 CBG 468

## 2019-05-12 NOTE — Progress Notes (Addendum)
Patient on side of bed continues to throw up. Patient states," I just don't feel good." Heart rate elevated 110s-120s. B/P WNL. Afebrile. Remains on insulin drip continuous. Paged [facility] about N/V. Also paged [facility] about patient's c/o of sharp abdominal pain and c/o indigestion. Paged [facility] again about patient's attempt to have a bowel movement and him c/o of sharp abdominal pain. Patient continues to moan out. Attempting to reassure patient.   2336- New orders noted.  2350-Paged [facility] again, patient continues to moan in bed for stomach pain. Attempting to continue to reassure patient. No new orders for pain noted at this time.   0011- Received call back, updated patient on the test that the dr has ordered for him.   0030- Transport here to take patient to radiology. Taken in bed on cardiac monitoring. Compazine given prn n/v prior to going.   0211- Paged [facility] about patient's continued c/o abdominal pain.   0230- New orders noted.   0330- Patient in bed resting with eyes closed.   2158- Paged [facility] about critical potassium of 2.4

## 2019-05-12 NOTE — Progress Notes (Signed)
Latest k level- 2.9,  Anion gap- 16, CO2-15. MD made aware with order.

## 2019-05-12 NOTE — ED Provider Notes (Addendum)
[facility] EMERGENCY DEPARTMENT Provider Note   CSN: [id number] Arrival date & time: 05/12/19  7416     History Chief Complaint  Patient presents with  . Weakness  . Hyperglycemia  . Hypotension    Caleb Chambers is a 54 y.o. male.  The history is provided by the patient. No language interpreter was used.  Weakness Severity:  Moderate Onset quality:  Gradual Duration:  1 week Timing:  Constant Progression:  Worsening Chronicity:  New Context: dehydration   Relieved by:  Nothing Worsened by:  Nothing Ineffective treatments:  None tried Associated symptoms: abdominal pain and nausea   Risk factors: diabetes   Hyperglycemia Associated symptoms: abdominal pain, nausea and weakness    Pt reports he was discharged from the hospital on 1/13 after having pancreatitis.  Pt reports he has had difficulty eating and drinking since discharge.  Pt reports he has not been able to eat in a weak.  Pt reports he was unable to stand today.      Past Medical History:  Diagnosis Date  . Diabetes mellitus without complication (HCC)   . Gout   . Hypertension   . Pancreatitis     Patient Active Problem List   Diagnosis Date Noted  . Morbid obesity (HCC)   . Abdominal pain, epigastric   . Abnormal CT of the abdomen   . Acute pancreatitis 04/20/2019  . DKA (diabetic ketoacidoses) (HCC) 04/20/2019  . Acute kidney injury [facility])     History reviewed. No pertinent surgical history.     Family History  Problem Relation Age of Onset  . Healthy Mother   . Hypertension Father     Social History   Tobacco Use  . Smoking status: Never Smoker  . Smokeless tobacco: Never Used  Substance Use Topics  . Alcohol use: Not Currently  . Drug use: Not on file    Home Medications Prior to Admission medications   Medication Sig Start Date End Date Taking? Authorizing Provider  allopurinol (ZYLOPRIM) 300 MG tablet Take 300 mg by mouth daily.    Yes [provider]  amLODipine (NORVASC) 10 MG tablet Take 10 mg by mouth daily. 11/30/16  Yes [provider]  bumetanide (BUMEX) 0.5 MG tablet Take 0.5 mg by mouth daily as needed (fluid).    Yes [provider]  colchicine (COLCRYS) 0.6 MG tablet Take 1 tablet (0.6 mg total) by mouth 2 (two) times daily. Needs office visit (second notice) Patient taking differently: Take 0.6 mg by mouth daily as needed (gout flare up).  10/30/11  Yes Marte, Heather M, PA-C  famotidine (PEPCID) 40 MG tablet Take 40 mg by mouth daily. 05/07/19  Yes [provider]  HUMIRA PEN 40 MG/0.4ML PNKT 0.4 mLs by Subdermal route every 14 (fourteen) days. 02/09/19  Yes [provider]  Insulin Glargine (BASAGLAR KWIKPEN) 100 UNIT/ML SOPN Inject 5-15 Units into the skin See admin instructions. Take (??) units every other day starting with 15u, 10u, 5u, then repeat trend. (ex. Sun-15, Tue-10, Thur-5, Sat-15.[name]...)   Yes [provider]  lisinopril-hydrochlorothiazide (ZESTORETIC) 20-12.5 MG tablet Take 2 tablets by mouth daily. 07/16/16  Yes [provider]  metFORMIN (GLUCOPHAGE-XR) 500 MG 24 hr tablet Take 1,000 mg by mouth 2 (two) times daily.   Yes [provider]  potassium chloride (KLOR-CON) 10 MEQ tablet Take 2 tablets (20 mEq total) by mouth daily. 04/29/19  Yes Leatha Gilding, MD    Allergies  Penicillins, Metoprolol, and Clindamycin  Review of Systems   Review of Systems  Gastrointestinal: Positive for abdominal pain and nausea.  Neurological: Positive for weakness.  All other systems reviewed and are negative.   Physical Exam Updated Vital Signs BP 105/62   Pulse (!) 103   Temp 97.9 F (36.6 C) (Oral)   Resp 20   Ht 6' (1.829 m)   Wt (!) 145.2 kg   SpO2 100%   BMI 43.40 kg/m   Physical Exam Vitals and nursing note reviewed.  Constitutional:      Appearance: He is well-developed.  HENT:     Head: Normocephalic and atraumatic.     Nose:  Nose normal.     Mouth/Throat:     Mouth: Mucous membranes are moist.  Eyes:     Conjunctiva/sclera: Conjunctivae normal.  Cardiovascular:     Rate and Rhythm: Regular rhythm. Tachycardia present.     Heart sounds: No murmur.  Pulmonary:     Effort: Pulmonary effort is normal. No respiratory distress.     Breath sounds: Normal breath sounds.  Abdominal:     General: Abdomen is flat.     Palpations: Abdomen is soft.     Tenderness: There is abdominal tenderness.  Musculoskeletal:        General: Normal range of motion.     Cervical back: Neck supple.  Skin:    General: Skin is warm and dry.  Neurological:     General: No focal deficit present.     Mental Status: He is alert.  Psychiatric:        Mood and Affect: Mood normal.     ED Results / Procedures / Treatments   Labs (all labs ordered are listed, but only abnormal results are displayed) Labs Reviewed  CBC - Abnormal; Notable for the following components:      Result Value   Hemoglobin 10.7 (*)    HCT 33.4 (*)    MCV 77.1 (*)    MCH 24.7 (*)    All other components within normal limits  COMPREHENSIVE METABOLIC PANEL - Abnormal; Notable for the following components:   Sodium 123 (*)    Chloride 87 (*)    CO2 14 (*)    Glucose, Bld 609 (*)    BUN 57 (*)    Creatinine, Ser 2.96 (*)    Calcium 11.6 (*)    Albumin 1.9 (*)    Total Bilirubin 1.3 (*)    GFR calc non Af Amer 23 (*)    GFR calc Af Amer 27 (*)    Anion gap 22 (*)    All other components within normal limits  CBG MONITORING, ED - Abnormal; Notable for the following components:   Glucose-Capillary 584 (*)    All other components within normal limits  LIPASE, BLOOD  URINALYSIS, ROUTINE W REFLEX MICROSCOPIC  BASIC METABOLIC PANEL  BASIC METABOLIC PANEL  BASIC METABOLIC PANEL  BASIC METABOLIC PANEL  BETA-HYDROXYBUTYRIC ACID  BETA-HYDROXYBUTYRIC ACID  CBG MONITORING, ED  CBG MONITORING, ED    EKG EKG Interpretation  Date/Time:  Tuesday  May 12 2019 09:45:58 EST Ventricular Rate:  98 PR Interval:    QRS Duration: 106 QT Interval:  335 QTC Calculation: 428 R Axis:   -24 Text Interpretation: Sinus rhythm Borderline left axis deviation Low voltage, extremity leads Confirmed by Sherwood Gambler [phone]) on 05/12/2019 11:18:42 AM   Radiology No results found.  Procedures .Critical Care Performed by: Fransico Meadow, PA-C Authorized by: Threasa Alpha,  Lonia Skinner, PA-C   Critical care provider statement:    Critical care time (minutes):  45   Critical care start time:  05/12/2019 9:45 AM   Critical care end time:  05/12/2019 11:30 AM   Critical care time was exclusive of:  Separately billable procedures and treating other patients   Critical care was necessary to treat or prevent imminent or life-threatening deterioration of the following conditions:  Circulatory failure, dehydration and endocrine crisis   Critical care was time spent personally by me on the following activities:  Discussions with consultants, evaluation of patient's response to treatment, examination of patient, ordering and performing treatments and interventions, ordering and review of laboratory studies, ordering and review of radiographic studies, pulse oximetry, re-evaluation of patient's condition, obtaining history from patient or surrogate and review of old charts   I assumed direction of critical care for this patient from another provider in my specialty: no     (including critical care time)  Medications Ordered in ED Medications  sodium chloride 0.9 % bolus 1,000 mL (has no administration in time range)  insulin regular, human (MYXREDLIN) 100 units/ 100 mL infusion (has no administration in time range)  0.9 %  sodium chloride infusion (has no administration in time range)  dextrose 5 %-0.45 % sodium chloride infusion (has no administration in time range)  dextrose 50 % solution 0-50 mL (has no administration in time range)  sodium chloride 0.9 % bolus  1,000 mL (has no administration in time range)  potassium chloride 10 mEq in 100 mL IVPB (has no administration in time range)  sodium chloride flush (NS) 0.9 % injection 3 mL (3 mLs Intravenous Given 05/12/19 1003)  ondansetron (ZOFRAN) injection 4 mg (4 mg Intravenous Given 05/12/19 1042)  sodium chloride 0.9 % bolus 1,000 mL (1,000 mLs Intravenous New Bag/Given 05/12/19 1109)    ED Course  I have reviewed the triage vital signs and the nursing notes.  Pertinent labs & imaging results that were available during my care of the patient were reviewed by me and considered in my medical decision making (see chart for details).    MDM Rules/Calculators/A&P                      MDM  Pt has glucose of 609, BUn of 57 and creatine of 2.96.  Sodium is 123. Co2 is 14 and anion gap is 22.   Iv fluids ordered.  Pt was hypotensive when he arrived at 80/40.  Hyperglycemic orders.   Consult to Hospitalist for admission. Dr. Ophelia Charter will see and admit  Final Clinical Impression(s) / ED Diagnoses Final diagnoses:  Diabetic ketoacidosis without coma associated with other specified diabetes mellitus (HCC)  Dehydration  AKI (acute kidney injury) [facility])    Rx / DC Orders ED Discharge Orders    None       Osie Cheeks 05/12/19 1130    Osie Cheeks 05/12/19 1158    Pricilla Loveless, MD 05/13/19 [phone]

## 2019-05-13 ENCOUNTER — Inpatient Hospital Stay (HOSPITAL_COMMUNITY): Payer: BC Managed Care – PPO

## 2019-05-13 DIAGNOSIS — E43 Unspecified severe protein-calorie malnutrition: Secondary | ICD-10-CM | POA: Insufficient documentation

## 2019-05-13 DIAGNOSIS — R112 Nausea with vomiting, unspecified: Secondary | ICD-10-CM

## 2019-05-13 LAB — BASIC METABOLIC PANEL
Anion gap: 11 (ref 5–15)
Anion gap: 15 (ref 5–15)
Anion gap: 10 (ref 5–15)
Anion gap: 12 (ref 5–15)
BUN: 42 mg/dL — ABNORMAL HIGH (ref 6–20)
BUN: 35 mg/dL — ABNORMAL HIGH (ref 6–20)
BUN: 33 mg/dL — ABNORMAL HIGH (ref 6–20)
BUN: 31 mg/dL — ABNORMAL HIGH (ref 6–20)
CO2: 20 mmol/L — ABNORMAL LOW (ref 22–32)
CO2: 18 mmol/L — ABNORMAL LOW (ref 22–32)
CO2: 23 mmol/L (ref 22–32)
CO2: 22 mmol/L (ref 22–32)
Calcium: 11.6 mg/dL — ABNORMAL HIGH (ref 8.9–10.3)
Calcium: 11.5 mg/dL — ABNORMAL HIGH (ref 8.9–10.3)
Calcium: 11.3 mg/dL — ABNORMAL HIGH (ref 8.9–10.3)
Calcium: 11.2 mg/dL — ABNORMAL HIGH (ref 8.9–10.3)
Chloride: 100 mmol/L (ref 98–111)
Chloride: 99 mmol/L (ref 98–111)
Chloride: 101 mmol/L (ref 98–111)
Chloride: 99 mmol/L (ref 98–111)
Creatinine, Ser: 1.49 mg/dL — ABNORMAL HIGH (ref 0.61–1.24)
Creatinine, Ser: 1.23 mg/dL (ref 0.61–1.24)
Creatinine, Ser: 1.3 mg/dL — ABNORMAL HIGH (ref 0.61–1.24)
Creatinine, Ser: 1.16 mg/dL (ref 0.61–1.24)
GFR calc Af Amer: >60 mL/min (ref >60)
GFR calc Af Amer: >60 mL/min (ref >60)
GFR calc Af Amer: >60 mL/min (ref >60)
GFR calc Af Amer: >60 mL/min (ref >60)
GFR calc non Af Amer: 53 mL/min — ABNORMAL LOW (ref >60)
GFR calc non Af Amer: >60 mL/min (ref >60)
GFR calc non Af Amer: >60 mL/min (ref >60)
GFR calc non Af Amer: >60 mL/min (ref >60)
Glucose, Bld: 207 mg/dL — ABNORMAL HIGH (ref 70–99)
Glucose, Bld: 185 mg/dL — ABNORMAL HIGH (ref 70–99)
Glucose, Bld: 176 mg/dL — ABNORMAL HIGH (ref 70–99)
Glucose, Bld: 159 mg/dL — ABNORMAL HIGH (ref 70–99)
Potassium: 2.4 mmol/L — CL (ref 3.5–5.1)
Potassium: 3.4 mmol/L — ABNORMAL LOW (ref 3.5–5.1)
Potassium: 3.3 mmol/L — ABNORMAL LOW (ref 3.5–5.1)
Potassium: 2.9 mmol/L — ABNORMAL LOW (ref 3.5–5.1)
Sodium: 131 mmol/L — ABNORMAL LOW (ref 135–145)
Sodium: 132 mmol/L — ABNORMAL LOW (ref 135–145)
Sodium: 134 mmol/L — ABNORMAL LOW (ref 135–145)
Sodium: 133 mmol/L — ABNORMAL LOW (ref 135–145)

## 2019-05-13 LAB — GLUCOSE, CAPILLARY
Glucose-Capillary: 193 mg/dL — ABNORMAL HIGH (ref 70–99)
Glucose-Capillary: 194 mg/dL — ABNORMAL HIGH (ref 70–99)
Glucose-Capillary: 208 mg/dL — ABNORMAL HIGH (ref 70–99)
Glucose-Capillary: 208 mg/dL — ABNORMAL HIGH (ref 70–99)
Glucose-Capillary: 230 mg/dL — ABNORMAL HIGH (ref 70–99)
Glucose-Capillary: 260 mg/dL — ABNORMAL HIGH (ref 70–99)
Glucose-Capillary: 176 mg/dL — ABNORMAL HIGH (ref 70–99)
Glucose-Capillary: 180 mg/dL — ABNORMAL HIGH (ref 70–99)
Glucose-Capillary: 203 mg/dL — ABNORMAL HIGH (ref 70–99)
Glucose-Capillary: 165 mg/dL — ABNORMAL HIGH (ref 70–99)
Glucose-Capillary: 180 mg/dL — ABNORMAL HIGH (ref 70–99)
Glucose-Capillary: 195 mg/dL — ABNORMAL HIGH (ref 70–99)
Glucose-Capillary: 168 mg/dL — ABNORMAL HIGH (ref 70–99)
Glucose-Capillary: 184 mg/dL — ABNORMAL HIGH (ref 70–99)
Glucose-Capillary: 173 mg/dL — ABNORMAL HIGH (ref 70–99)
Glucose-Capillary: 187 mg/dL — ABNORMAL HIGH (ref 70–99)
Glucose-Capillary: 159 mg/dL — ABNORMAL HIGH (ref 70–99)
Glucose-Capillary: 170 mg/dL — ABNORMAL HIGH (ref 70–99)
Glucose-Capillary: 137 mg/dL — ABNORMAL HIGH (ref 70–99)
Glucose-Capillary: 137 mg/dL — ABNORMAL HIGH (ref 70–99)
Glucose-Capillary: 148 mg/dL — ABNORMAL HIGH (ref 70–99)

## 2019-05-13 LAB — MRSA PCR SCREENING: MRSA by PCR: NEGATIVE

## 2019-05-13 LAB — BETA-HYDROXYBUTYRIC ACID: Beta-Hydroxybutyric Acid: 0.36 mmol/L — ABNORMAL HIGH (ref 0.05–0.27)

## 2019-05-13 LAB — PHOSPHORUS: Phosphorus: 1.6 mg/dL — ABNORMAL LOW (ref 2.5–4.6)

## 2019-05-13 LAB — MAGNESIUM: Magnesium: 1.7 mg/dL (ref 1.7–2.4)

## 2019-05-13 MED ORDER — NALOXONE HCL 0.4 MG/ML IJ SOLN: 0.4000 mg | INTRAMUSCULAR | Status: DC | PRN

## 2019-05-13 MED ORDER — ALUM & MAG HYDROXIDE-SIMETH 200-200-20 MG/5ML PO SUSP: 30.0000 mL | ORAL | Status: DC | PRN

## 2019-05-13 MED ORDER — HYDROMORPHONE 1 MG/ML IV SOLN
INTRAVENOUS | Status: AC
  Administered 2019-05-13: 30 mg via INTRAVENOUS
  Administered 2019-05-14: 0.5 mg via INTRAVENOUS
  Administered 2019-05-14 (×2): 0.3 mg via INTRAVENOUS
  Administered 2019-05-14 (×2): 1.5 mg via INTRAVENOUS
  Administered 2019-05-14: 0 mg via INTRAVENOUS
  Administered 2019-05-15: 0.9 mg via INTRAVENOUS
  Administered 2019-05-15: 1.5 mg via INTRAVENOUS
  Administered 2019-05-15: 0.9 mg via INTRAVENOUS
  Administered 2019-05-15: 0 mg via INTRAVENOUS
  Administered 2019-05-15: 1.2 mg via INTRAVENOUS
  Filled 2019-05-13: qty 30

## 2019-05-13 MED ORDER — DIPHENHYDRAMINE HCL 50 MG/ML IJ SOLN: 12.5000 mg | Freq: Four times a day (QID) | INTRAMUSCULAR | Status: DC | PRN

## 2019-05-13 MED ORDER — ONDANSETRON HCL 4 MG/2ML IJ SOLN: 4.0000 mg | Freq: Four times a day (QID) | INTRAMUSCULAR | Status: DC | PRN

## 2019-05-13 MED ORDER — FENTANYL CITRATE (PF) 100 MCG/2ML IJ SOLN
25.0000 ug | Freq: Once | INTRAMUSCULAR | Status: AC
  Administered 2019-05-13: 25 ug via INTRAVENOUS
  Filled 2019-05-13: qty 2

## 2019-05-13 MED ORDER — IOHEXOL 300 MG/ML  SOLN
100.0000 mL | Freq: Once | INTRAMUSCULAR | Status: AC | PRN
  Administered 2019-05-13: 100 mL via INTRAVENOUS

## 2019-05-13 MED ORDER — DIPHENHYDRAMINE HCL 12.5 MG/5ML PO ELIX
12.5000 mg | ORAL_SOLUTION | Freq: Four times a day (QID) | ORAL | Status: DC | PRN
  Administered 2019-06-03 – 2019-06-05 (×3): 12.5 mg via ORAL
  Filled 2019-05-13 (×4): qty 5

## 2019-05-13 MED ORDER — PANTOPRAZOLE SODIUM 40 MG IV SOLR
40.0000 mg | Freq: Two times a day (BID) | INTRAVENOUS | Status: DC
  Administered 2019-05-13 – 2019-05-28 (×31): 40 mg via INTRAVENOUS
  Filled 2019-05-13 (×31): qty 40

## 2019-05-13 MED ORDER — POTASSIUM CHLORIDE 10 MEQ/100ML IV SOLN
10.0000 meq | INTRAVENOUS | Status: AC
  Administered 2019-05-13 (×4): 10 meq via INTRAVENOUS
  Filled 2019-05-13 (×4): qty 100

## 2019-05-13 MED ORDER — MORPHINE SULFATE (PF) 2 MG/ML IV SOLN
2.0000 mg | INTRAVENOUS | Status: DC | PRN
  Administered 2019-05-13: 2 mg via INTRAVENOUS
  Filled 2019-05-13: qty 1

## 2019-05-13 MED ORDER — VITAL AF 1.2 CAL PO LIQD
1000.0000 mL | ORAL | Status: DC
  Administered 2019-05-14 – 2019-05-19 (×7): 1000 mL

## 2019-05-13 MED ORDER — SODIUM CHLORIDE 0.9% FLUSH
9.0000 mL | INTRAVENOUS | Status: DC | PRN
  Administered 2019-05-20 – 2019-05-27 (×4): 9 mL via INTRAVENOUS

## 2019-05-13 MED ORDER — MORPHINE SULFATE (PF) 4 MG/ML IV SOLN
4.0000 mg | INTRAVENOUS | Status: DC | PRN
  Administered 2019-05-13: 4 mg via INTRAVENOUS
  Filled 2019-05-13: qty 1

## 2019-05-13 NOTE — Progress Notes (Addendum)
Inpatient Diabetes Program Recommendations  AACE/ADA: New Consensus Statement on Inpatient Glycemic Control   Target Ranges:  Prepandial:   less than 140 mg/dL      Peak postprandial:   less than 180 mg/dL (1-2 hours)      Critically ill patients:  140 - 180 mg/dL  Results for Caleb Chambers, Caleb Chambers (MRN [id number]) as of 05/13/2019 08:16  Ref. Range 05/12/2019 23:30 05/13/2019 01:43 05/13/2019 03:42 05/13/2019 04:40 05/13/2019 05:40 05/13/2019 06:27 05/13/2019 07:35  Glucose-Capillary Latest Ref Range: 70 - 99 mg/dL 756 (H) 433 (H) 295 (H) 180 (H) 195 (H) 180 (H) 165 (H)  Results for Caleb Chambers, Caleb Chambers (MRN [id number]) as of 05/13/2019 08:16  Ref. Range 05/12/2019 09:53  Glucose Latest Ref Range: 70 - 99 mg/dL [facility])   Results for Caleb Chambers, Caleb Chambers (MRN [id number]) as of 05/13/2019 08:16  Ref. Range 04/22/2019 02:43  Hemoglobin A1C Latest Ref Range: 4.8 - 5.6 % 13.2 (H)   Review of Glycemic Control  Diabetes history: DM2 Outpatient Diabetes medications: Basaglar 20 units daily, Metformin XR 1000 mg BID Current orders for Inpatient glycemic control: IV insulin  Inpatient Diabetes Program Recommendations:   At time of transition from IV to SQ insulin: Once MD is ready to transition patient from IV to SQ insulin, please consider ordering Lantus 30 units Q24H, CBGs Q4H, Novolog 0-15 units Q4H.  NOTE: In reviewing chart, noted patient was inpatient 04/20/19 to 04/29/19 for pancreatitis. When patient was discharged on 04/29/19 he was instructed to stop taking Trulicity (due to risk of pancreatitis with the medication). Inpatient Diabetes Coordinator spoke with patient on 04/22/19 during last hospitalization.  Patient seen Dr. Leavy Cella on 05/07/19 and was told to start taking Lantus 18 units daily and increase it by 2 units every 2 days until he reached 28 units daily.  Per note by Dr. Leavy Cella, patient was referred to Endocrinology but patient has been too sick and weak to follow up and also noted that patient still does not have  a glucometer at home to check glucose. Will plan to talk with patient today.  Addendum 05/13/19@13 :45-Spoke with patient regarding DM control and outpatient DM medications. Patient sitting up in chair moving every few seconds trying to get comfortable with emesis bag at side. Patient states he feels bad and continues to have abdominal pain and nausea. Patient reports that he was taking Basaglar 20 units daily and Metformin 1000 mg BID at home and he confirms that he has not taken the Trulcity since being discharged from the hospital. Patient reports that he has gotten a glucometer and testing supplies and despite taking the Basaglar and Metformin, his glucose was running in the 300's mg/dl at home over the past few days. Explained that currently on IV insulin, he is requiring a lot of insulin to keep glucose within targets. Explained that glucose trends and insulin needs will continue to be followed to help determine appropriate SQ insulin requirements.  Patient verbalized understanding of information discussed and states he has no questions related to DM at this time.  Thanks, Orlando Penner, RN, MSN, CDE Diabetes Coordinator Inpatient Diabetes Program [phone] (Team Pager from 8am to 5pm)

## 2019-05-13 NOTE — Progress Notes (Signed)
Patient was c/o abd pain and received morphine 2mg  IVP & Zofran for nausea, but it didn't help much. Patient couldn't get comfortable in the bed. Blood sugar doesn't go to down much stays 180's. Continue to monitor blood sugar. HS 

## 2019-05-13 NOTE — Progress Notes (Addendum)
Initial Nutrition Assessment  DOCUMENTATION CODES:   Severe malnutrition in context of acute illness/injury  INTERVENTION:   Monitor magnesium, potassium, and phosphorus daily for at least 3 days, MD to replete as needed, as pt is at risk for refeeding syndrome given poor PO intake x1 month.  Initiate TF via feeding tube with Vital AF 1.2 Cal at 44mL/hour, advance 32mL Q8 until goal rate of 85 ml/h (2040 ml per day) to provide 2448 kcals, 153 gm protein, 1654 ml free water daily.   NUTRITION DIAGNOSIS:   Severe Malnutrition related to acute illness(pancreatitis) as evidenced by energy intake < or equal to 50% for > or equal to 5 days, moderate fat depletion, mild fat depletion, moderate muscle depletion, mild muscle depletion.   GOAL:   Patient will meet greater than or equal to 90% of their needs   MONITOR:   TF tolerance, Weight trends, Labs, I & O's  REASON FOR ASSESSMENT:   Consult Assessment of nutrition requirement/status  ASSESSMENT:   Pt with a PMH significant for HTN, DM, gout, and recent admission for pancreatitis and DKA presented to ED with DKA, abdominal pain, SOB, and minimal PO intake since last admission.   No PO intake documented; pt NPO.  RD received secure chat from pharmacy stating pharmacy had received a consult from Dr. Benson Norway for tube feeding recommendations and that a feeding tube is to be placed today.   Pt very tired during visit and unable to provide answers to most of RD's questions. Pt reports very little PO intake this month; also states he doesn't remember what he typically ate before this month.   Discussed pt with RN; RN reports difficulty obtaining information from pt due to difficulty waking pt. RN states pt has been C/O abdominal pain but not nausea.   Pt reports 50 lbs wt loss over approximately the last month, but states he cannot remember his usual body weight. Unable to verify accuracy of wt loss based on weights available in chart.  Current wt is listed as 320 lbs, though question accuracy of weight. Based on Care Everywhere, pt weighed was 284 lbs on 05/07/2019. Only other weights available in Care Everywhere are from 2018, when pt is listed to have weighed 317 lbs. RD will use 284 lb wt reading to calculate needs, feel this wt is most accurate.  Medications reviewed and include: Pepcid, D5-1/2 NS @125mL /hour, Myxredlin, KCl   Labs reviewed: Na 132 (L), K+ 3.4 (L), Corrected Calcium (from 1/26) 12.98 (H) CBGs 165-187  UOP: 1,179mL x24 hours I/O: +2,328.69mL since admit  689mL emesis output and 2x unmeasured output x24 hours  NUTRITION - FOCUSED PHYSICAL EXAM:    Most Recent Value  Orbital Region  Mild depletion  Upper Arm Region  Moderate depletion  Thoracic and Lumbar Region  No depletion  Buccal Region  No depletion  Temple Region  Moderate depletion  Clavicle Bone Region  Moderate depletion  Clavicle and Acromion Bone Region  Moderate depletion  Scapular Bone Region  Mild depletion  Dorsal Hand  Moderate depletion  Patellar Region  Mild depletion  Anterior Thigh Region  Moderate depletion  Posterior Calf Region  Moderate depletion  Edema (RD Assessment)  None  Hair  Reviewed  Eyes  Reviewed  Mouth  Reviewed  Skin  Reviewed  Nails  Reviewed       Diet Order:   Diet Order            Diet NPO time specified  Diet effective  now              EDUCATION NEEDS:   Not appropriate for education at this time  Skin:  Skin Assessment: Reviewed RN Assessment  Last BM:  1/25  Height:   Ht Readings from Last 1 Encounters:  05/12/19 6' (1.829 m)    Weight:   Wt Readings from Last 1 Encounters:  05/12/19 (!) 145.2 kg    Ideal Body Weight:  80.9 kg  BMI:  Body mass index is 43.4 kg/m.  Estimated Nutritional Needs:   Kcal:  2300-2500  Protein:  150-165 grams  Fluid:  >2L/d    Eugene Gavia, MS, RD, LDN Pager: [phone] Weekend/After Hours Pager: [phone]

## 2019-05-13 NOTE — Evaluation (Signed)
Occupational Therapy Evaluation Patient Details Name: Caleb Chambers MRN: [id number] DOB: [birth year] Today's Date: 05/13/2019    History of Present Illness Pt is a 54 year old man admitted 05/12/19 with N/V, abdominal pain and DKA. Pt discharged from [facility] on 04/29/19 with DKA and pancreatitis. He has not been able to eat much since his discharge. PMH: morbid obesity, HTN, gout, Crohn's disease, IDDM.    Clinical Impression   Pt was independent prior to admission, resides with his 42 year old mother. Pt presents with lethargy, generalized weakness and impaired standing balance. He needs +2 assist for all mobility and up to total assist for ADL. Pt will need intensive rehab, depending on progress once he is rested and DM is managed. Will follow.    Follow Up Recommendations  CIR    Equipment Recommendations  3 in 1 bedside commode    Recommendations for Other Services       Precautions / Restrictions Precautions Precautions: Fall Precaution Comments: lethargic Restrictions Weight Bearing Restrictions: No      Mobility Bed Mobility Overal bed mobility: Needs Assistance Bed Mobility: Supine to Sit     Supine to sit: Mod assist;+2 for physical assistance     General bed mobility comments: pt attempted to initiate transfer but was so fatigued that he required modAx2 to bring self to EOB  Transfers Overall transfer level: Needs assistance Equipment used: 2 person hand held assist Transfers: Sit to/from Stand;Stand Pivot Transfers Sit to Stand: Mod assist;+2 physical assistance Stand pivot transfers: Mod assist;+2 physical assistance       General transfer comment: bed elevated, pt required rocking into momentum to stand, modAx2 for std pvt to chair with assist for weight shifting and directional verbal cues    Balance Overall balance assessment: Needs assistance Sitting-balance support: Feet supported;Single extremity supported Sitting balance-Leahy Scale: Fair Sitting  balance - Comments: pt very fatigued, leans to the left   Standing balance support: Bilateral upper extremity supported Standing balance-Leahy Scale: Poor Standing balance comment: dependent on physical assist                           ADL either performed or assessed with clinical judgement   ADL Overall ADL's : Needs assistance/impaired Eating/Feeding: NPO   Grooming: Set up;Sitting   Upper Body Bathing: Moderate assistance;Sitting   Lower Body Bathing: Maximal assistance;Sit to/from stand;+2 for physical assistance   Upper Body Dressing : Moderate assistance;Sitting   Lower Body Dressing: Maximal assistance;+2 for physical assistance;Sit to/from stand   Toilet Transfer: +2 for physical assistance;Moderate assistance;Stand-pivot   Toileting- Clothing Manipulation and Hygiene: Total assistance;+2 for physical assistance;Sit to/from stand               Vision Patient Visual Report: No change from baseline       Perception     Praxis      Pertinent Vitals/Pain Pain Assessment: 0-10 Pain Score: 9  Pain Location: abdomen Pain Descriptors / Indicators: Constant;Discomfort Pain Intervention(s): Monitored during session;Repositioned     Hand Dominance Right   Extremity/Trunk Assessment Upper Extremity Assessment Upper Extremity Assessment: Generalized weakness   Lower Extremity Assessment Lower Extremity Assessment: Generalized weakness   Cervical / Trunk Assessment Cervical / Trunk Assessment: Other exceptions Cervical / Trunk Exceptions: distended abdomen   Communication Communication Communication: No difficulties   Cognition Arousal/Alertness: Lethargic Behavior During Therapy: WFL for tasks assessed/performed Overall Cognitive Status: Within Functional Limits for tasks assessed  General Comments: pt has to have blood sugars taken every hour, was up all night   General Comments  distended  abdomen, HR increased from 100 to 134bpm during PT/OT evaluation    Exercises     Shoulder Instructions      Home Living Family/patient expects to be discharged to:: Private residence Living Arrangements: Parent(54 year old mom) Available Help at Discharge: Family;Available 24 hours/day Type of Home: House Home Access: Stairs to enter [employer] of Steps: 5 Entrance Stairs-Rails: Can reach both Home Layout: Two level Alternate Level Stairs-Number of Steps: flight Alternate Level Stairs-Rails: Right Bathroom Shower/Tub: Teacher, early years/pre: Handicapped height     Home Equipment: Grab bars - toilet;Grab bars - tub/shower          Prior Functioning/Environment Level of Independence: Independent        Comments: pt was indep however hasn't been able to walk as much since last hospital admission        OT Problem List: Decreased strength;Decreased activity tolerance;Impaired balance (sitting and/or standing);Decreased knowledge of use of DME or AE;Pain      OT Treatment/Interventions: Self-care/ADL training;Therapeutic activities;Patient/family education;Balance training    OT Goals(Current goals can be found in the care plan section) Acute Rehab OT Goals Patient Stated Goal: feel better OT Goal Formulation: With patient Time For Goal Achievement: 05/27/19 Potential to Achieve Goals: Good ADL Goals Pt Will Perform Grooming: with supervision;standing Pt Will Perform Upper Body Bathing: with set-up;sitting Pt Will Perform Lower Body Bathing: with supervision;sit to/from stand Pt Will Perform Upper Body Dressing: with set-up;sitting Pt Will Perform Lower Body Dressing: with supervision;sit to/from stand Pt Will Transfer to Toilet: with supervision Pt Will Perform Toileting - Clothing Manipulation and hygiene: with supervision;sit to/from stand  OT Frequency: Min 2X/week   Barriers to D/C:            Co-evaluation PT/OT/SLP  Co-Evaluation/Treatment: Yes Reason for Co-Treatment: For patient/therapist safety PT goals addressed during session: Mobility/safety with mobility OT goals addressed during session: ADL's and self-care      AM-PAC OT "6 Clicks" Daily Activity     Outcome Measure Help from another person eating meals?: None Help from another person taking care of personal grooming?: A Little Help from another person toileting, which includes using toliet, bedpan, or urinal?: Total Help from another person bathing (including washing, rinsing, drying)?: A Lot Help from another person to put on and taking off regular upper body clothing?: A Lot Help from another person to put on and taking off regular lower body clothing?: Total 6 Click Score: 13   End of Session Nurse Communication: Mobility status  Activity Tolerance: Patient limited by lethargy Patient left: in chair;with call bell/phone within reach  OT Visit Diagnosis: Unsteadiness on feet (R26.81);Pain;Muscle weakness (generalized) (M62.81)                Time: 7616-0737 OT Time Calculation (min): 13 min Charges:  OT General Charges $OT Visit: 1 Visit OT Evaluation $OT Eval Moderate Complexity: 1 Mod  [name], OTR/L Acute Rehabilitation Services Pager: [phone] Office: [phone]  [name] 05/13/2019, 1:36 PM

## 2019-05-13 NOTE — Plan of Care (Signed)
  Problem: Education: Goal: Ability to describe self-care measures that may prevent or decrease complications (Diabetes Survival Skills Education) will improve 05/13/2019 2340 by Rema Fendt, RN Outcome: Progressing 05/13/2019 2108 by Rema Fendt, RN Outcome: Progressing Goal: Individualized Educational Video(s) 05/13/2019 2340 by Rema Fendt, RN Outcome: Progressing 05/13/2019 2108 by Rema Fendt, RN Outcome: Progressing   Problem: Cardiac: Goal: Ability to maintain an adequate cardiac output will improve 05/13/2019 2340 by Rema Fendt, RN Outcome: Progressing 05/13/2019 2108 by Rema Fendt, RN Outcome: Progressing   Problem: Health Behavior/Discharge Planning: Goal: Ability to identify and utilize available resources and services will improve 05/13/2019 2340 by Rema Fendt, RN Outcome: Progressing 05/13/2019 2108 by Rema Fendt, RN Outcome: Progressing Goal: Ability to manage health-related needs will improve 05/13/2019 2340 by Rema Fendt, RN Outcome: Progressing 05/13/2019 2108 by Rema Fendt, RN Outcome: Progressing   Problem: Fluid Volume: Goal: Ability to achieve a balanced intake and output will improve 05/13/2019 2340 by Rema Fendt, RN Outcome: Progressing 05/13/2019 2108 by Rema Fendt, RN Outcome: Progressing   Problem: Metabolic: Goal: Ability to maintain appropriate glucose levels will improve 05/13/2019 2340 by Rema Fendt, RN Outcome: Progressing 05/13/2019 2108 by Rema Fendt, RN Outcome: Progressing   Problem: Nutritional: Goal: Maintenance of adequate nutrition will improve 05/13/2019 2340 by Rema Fendt, RN Outcome: Progressing 05/13/2019 2108 by Rema Fendt, RN Outcome: Progressing Goal: Maintenance of adequate weight for body size and type will improve 05/13/2019 2340 by Rema Fendt, RN Outcome: Progressing 05/13/2019 2108 by Rema Fendt, RN Outcome: Progressing    Problem: Respiratory: Goal: Will regain and/or maintain adequate ventilation 05/13/2019 2340 by Rema Fendt, RN Outcome: Progressing 05/13/2019 2108 by Rema Fendt, RN Outcome: Progressing   Problem: Urinary Elimination: Goal: Ability to achieve and maintain adequate renal perfusion and functioning will improve 05/13/2019 2340 by Rema Fendt, RN Outcome: Progressing 05/13/2019 2108 by Rema Fendt, RN Outcome: Progressing   Problem: Education: Goal: Knowledge of General Education information will improve Description: Including pain rating scale, medication(s)/side effects and non-pharmacologic comfort measures Outcome: Progressing   Problem: Health Behavior/Discharge Planning: Goal: Ability to manage health-related needs will improve Outcome: Progressing   Problem: Clinical Measurements: Goal: Ability to maintain clinical measurements within normal limits will improve Outcome: Progressing Goal: Will remain free from infection Outcome: Progressing Goal: Diagnostic test results will improve Outcome: Progressing Goal: Respiratory complications will improve Outcome: Progressing Goal: Cardiovascular complication will be avoided Outcome: Progressing   Problem: Activity: Goal: Risk for activity intolerance will decrease Outcome: Progressing   Problem: Nutrition: Goal: Adequate nutrition will be maintained Outcome: Progressing   Problem: Coping: Goal: Level of anxiety will decrease Outcome: Progressing   Problem: Elimination: Goal: Will not experience complications related to bowel motility Outcome: Progressing Goal: Will not experience complications related to urinary retention Outcome: Progressing   Problem: Pain Managment: Goal: General experience of comfort will improve Outcome: Progressing   Problem: Safety: Goal: Ability to remain free from injury will improve Outcome: Progressing   Problem: Skin Integrity: Goal: Risk for impaired skin  integrity will decrease Outcome: Progressing

## 2019-05-13 NOTE — Progress Notes (Signed)
PROGRESS NOTE  Caleb Chambers HQP:[id number] DOB: Nov 21, 1965 DOA: 05/12/2019 PCP: Patient, No Pcp Per   LOS: 1 day   Brief narrative: As per HPI,  Caleb Chambers is a 54 y.o. male with medical history significant of HTN; DM; gout; and recent admission for pancreatitis and DKA  presented to the hospital with complaints of minimal oral intake since his discharge.  He presented to hospital he was beginning IV ketoacidosis.  He reported shortness of breath and decreased ability to eat and drink.  He has not been fully compliant with insulin regimen as well.  Pancreatitis is better but has some ongoing abdominal pain.  Patient was then admitted to the hospital again for DKA and poor oral intolerance.  Assessment/Plan:  Principal Problem:   DKA (diabetic ketoacidosis) (HCC) Active Problems:   Acute pancreatitis   Acute kidney injury (HCC)   Morbid obesity (HCC)   Debility  Recurrent DKA -Secondary to ongoing nausea, vomiting and poor intolerance with noncompliance to insulin regimen.  A1c was 13.2 on 1/6, signifying poorly controlled diabetes.  Currently on insulin drip.  Anion gap has closed twice and he is bicarb is 20.  He is currently n.p.o. and complaining of abdominal pain nausea vomiting.  Continue IV fluids.  Keep n.p.o. for now.  Abdominal pain nausea vomiting.  Keep n.p.o.  We will put the patient on low-dose morphine, MiraLAX 8 changed to IV Protonix.  Will closely monitor.  Lipase within normal limits.  If patient persists to have pain might need repeat CT scan.  Hypokalemia Potassium 2.4.  IV potassium has been added to the regimen.  Patient has received 40 mEq of potassium IV today.  Will closely monitor.  BMP every 4 hourly.  Acute kidney injury on presentation.  Likely secondary to volume depletion poor oral intake and use of ACE inhibitor's and diuretics.  Continue to hold ACE inhibitor's and diuretics.  Continue IV fluids.  Improving creatinine levels.  Check BMP in a.m.   Latest creatinine of 1.4.  Essential hypertension Patient was on Zestoretic and Norvasc at home.  Will hold for now.  Borderline low blood pressure at this time.  Morbid obesity -BMI 43.4, life style modification to be emphasized on discharge.  Debility and deconditioning. Will obtain physical therapy, Occupational Therapy evaluation.  History of recent pancreatitis Complains of moderate to severe abdominal pain.  Lipase was 49.  VTE Prophylaxis: Lovenox   Code Status:  Full code  Family Communication: Spoke with the patient at bedside.  Disposition Plan: Home in 2 to 3 days, pending clinical improvement.   Consultants:  GI  Procedures:  None  Antibiotics:  Anti-infectives (From admission, onward)   None     Subjective:  Today, patient was seen and examined at bedside.  Patient complains of severe burning sensation in his epigastric region, 9/ 10 in intensity.  Complains of nausea and 3-4 episodes of vomiting yesterday.  Patient did have a bowel movement day before yesterday.  Objective: Vitals:   05/13/19 0300 05/13/19 0400  BP: 110/64 (!) 104/47  Pulse: (!) 107 (!) 107  Resp: (!) 21 (!) 22  Temp:    SpO2: 96% 98%    Intake/Output Summary (Last 24 hours) at 05/13/2019 0720 Last data filed at 05/13/2019 0600 Gross per 24 hour  Intake 4108.79 ml  Output 1300 ml  Net 2808.79 ml   Filed Weights   05/12/19 0946  Weight: (!) 145.2 kg   Body mass index is 43.4 kg/m.  Physical Exam:  GENERAL: Patient is alert awake and oriented. Not in obvious distress.  In mild distress due to pain, morbidly obese HENT: No scleral pallor or icterus. Pupils equally reactive to light. Oral mucosa is mildly dry. NECK: is supple, no palpable thyroid enlargement. CHEST: Clear to auscultation. No crackles or wheezes. Non tender on palpation. Diminished breath sounds bilaterally. CVS: S1 and S2 heard, no murmur. Regular rate and rhythm.  Tachycardic. ABDOMEN: Soft, mild  epigastric tenderness on palpation., bowel sounds are present. EXTREMITIES: No edema. CNS: Cranial nerves are intact. No focal motor or sensory deficits. SKIN: warm and dry without rashes.   Data Review: I have personally reviewed the following laboratory data and studies,  CBC: Recent Labs  Lab 05/12/19 0950  WBC 6.4  HGB 10.7*  HCT 33.4*  MCV 77.1*  PLT 841   Basic Metabolic Panel: Recent Labs  Lab 05/12/19 1137 05/12/19 1546 05/12/19 1935 05/12/19 2256 05/13/19 0308  NA 124* 128* 131* 129* 131*  K 3.5 2.9* 3.0* 2.9* 2.4*  CL 88* 97* 98 97* 100  CO2 14* 15* 19* 20* 20*  GLUCOSE 607* 329* 221* 198* 207*  BUN 58* 55* 51* 46* 42*  CREATININE 2.82* 2.40* 2.05* 1.67* 1.49*  CALCIUM 11.5* 11.5* 11.9* 11.3* 11.6*   Liver Function Tests: Recent Labs  Lab 05/12/19 0953  AST 15  ALT 17  ALKPHOS 81  BILITOT 1.3*  PROT 6.9  ALBUMIN 1.9*   Recent Labs  Lab 05/12/19 0953  LIPASE 49   No results for input(s): AMMONIA in the last 168 hours. Cardiac Enzymes: No results for input(s): CKTOTAL, CKMB, CKMBINDEX, TROPONINI in the last 168 hours. BNP (last 3 results) Recent Labs    04/23/19 0450  BNP 99.2    ProBNP (last 3 results) No results for input(s): PROBNP in the last 8760 hours.  CBG: Recent Labs  Lab 05/12/19 2218 05/12/19 2330 05/13/19 0143 05/13/19 0342 05/13/19 0440  GLUCAP 194* 193* 203* 176* 180*   Recent Results (from the past 240 hour(s))  SARS CORONAVIRUS 2 (TAT 6-24 HRS) Nasopharyngeal Nasopharyngeal Swab     Status: None   Collection Time: 05/12/19  2:36 PM   Specimen: Nasopharyngeal Swab  Result Value Ref Range Status   SARS Coronavirus 2 NEGATIVE NEGATIVE Final    Comment: (NOTE) SARS-CoV-2 target nucleic acids are NOT DETECTED. The SARS-CoV-2 RNA is generally detectable in upper and lower respiratory specimens during the acute phase of infection. Negative results do not preclude SARS-CoV-2 infection, do not rule out co-infections  with other pathogens, and should not be used as the sole basis for treatment or other patient management decisions. Negative results must be combined with clinical observations, patient history, and epidemiological information. The expected result is Negative. Fact Sheet for Patients: SugarRoll.be Fact Sheet for Healthcare Providers: https://www.woods-mathews.com/ This test is not yet approved or cleared by the [place] FDA and  has been authorized for detection and/or diagnosis of SARS-CoV-2 by FDA under an Emergency Use Authorization (EUA). This EUA will remain  in effect (meaning this test can be used) for the duration of the COVID-19 declaration under Section 56 4(b)(1) of the Act, 21 U.S.C. section 360bbb-3(b)(1), unless the authorization is terminated or revoked sooner. Performed at [facility], [place] [address] [place], [place] [address]      Studies: DG Abd 1 View  Result Date: 05/13/2019 CLINICAL DATA:  Initial evaluation for acute nausea, vomiting, abdominal pain. EXAM: ABDOMEN - 1 VIEW COMPARISON:  Prior  CT from 04/20/2019. FINDINGS: Bowel gas pattern is within normal limits without obstruction or ileus. No abnormal bowel wall thickening. No visible free air on the supine views of the abdomen. No soft tissue mass or abnormal calcification. Cholecystectomy clips noted within the right upper quadrant. No acute osseous abnormality. Degenerative changes noted about the hips bilaterally. IMPRESSION: Nonobstructive bowel gas pattern with no radiographic evidence for acute intra-abdominal process. Electronically Signed   By: Rise Mu M.D.   On: 05/13/2019 01:01    Scheduled Meds: . allopurinol  300 mg Oral Daily  . enoxaparin (LOVENOX) injection  40 mg Subcutaneous Q24H  . famotidine  40 mg Oral Daily    Continuous Infusions: . sodium chloride Stopped (05/12/19 1912)  . dextrose 5 % and 0.45% NaCl 125 mL/hr at  05/13/19 0346  . insulin 9.5 Units/hr (05/13/19 0629)  . potassium chloride 10 mEq (05/13/19 5672)     Joycelyn Das, MD  [facility] 05/13/2019

## 2019-05-13 NOTE — Evaluation (Signed)
Physical Therapy Evaluation Patient Details Name: Caleb Chambers MRN: [id number] DOB: Oct 20, 1965 Today's Date: 05/13/2019   History of Present Illness  Pt is a 54 year old man admitted 05/12/19 with N/V, abdominal pain and DKA. Pt discharged from [facility] on 04/29/19 with DKA and pancreatitis. He has not been able to eat much since his discharge. PMH: morbid obesity, HTN, gout, Crohn's disease, IDDM.     Clinical Impression  Pt admitted with above. Pt very sleepy/lethargic due to inability to rest due to freq blood sugar checks. Pt cognitively intact however very fatigued and lethargic requiring modAx2 for transfers and OOB to chair. Anticipate once pt's DM is under control pt will progress well however at this time recommend CIR as pt was indep living with his mother PTA and is currently requiring modAx2. Acute PT to cont to follow.    Follow Up Recommendations CIR;Supervision/Assistance - 24 hour(may progress well enough for HHPT)    Equipment Recommendations  Rolling walker with 5" wheels    Recommendations for Other Services Rehab consult     Precautions / Restrictions Precautions Precautions: Fall Precaution Comments: lethargic Restrictions Weight Bearing Restrictions: No      Mobility  Bed Mobility Overal bed mobility: Needs Assistance Bed Mobility: Supine to Sit     Supine to sit: Mod assist;+2 for physical assistance     General bed mobility comments: pt attempted to initiate transfer but was so fatigued that he required modAx2 to bring self to EOB  Transfers Overall transfer level: Needs assistance Equipment used: 2 person hand held assist Transfers: Sit to/from Stand;Stand Pivot Transfers Sit to Stand: Mod assist;+2 physical assistance Stand pivot transfers: Mod assist;+2 physical assistance       General transfer comment: bed elevated, pt required rocking into momentum to stand, modAx2 for std pvt to chair with assist for weight shifting and directional verbal  cues  Ambulation/Gait             General Gait Details: limited to stand pvt to chair due to fatigue and lethargy  Stairs            Wheelchair Mobility    Modified Rankin (Stroke Patients Only)       Balance Overall balance assessment: Needs assistance Sitting-balance support: Feet supported;Single extremity supported Sitting balance-Leahy Scale: Fair Sitting balance - Comments: pt very fatigued, leans to the left   Standing balance support: Bilateral upper extremity supported Standing balance-Leahy Scale: Poor Standing balance comment: dependent on physical assist                             Pertinent Vitals/Pain Pain Assessment: 0-10 Pain Score: 9  Pain Location: abdomen Pain Descriptors / Indicators: Constant;Discomfort Pain Intervention(s): Monitored during session    Home Living Family/patient expects to be discharged to:: Private residence Living Arrangements: Parent Available Help at Discharge: Family;Available 24 hours/day(elderly mother) Type of Home: House Home Access: Stairs to enter Entrance Stairs-Rails: Can reach both Entrance Stairs-Number of Steps: 5 Home Layout: Two level Home Equipment: Grab bars - toilet;Grab bars - tub/shower      Prior Function Level of Independence: Independent         Comments: pt was indep however hasn't been able to walk as much since last hospital admission     Hand Dominance   Dominant Hand: Right    Extremity/Trunk Assessment   Upper Extremity Assessment Upper Extremity Assessment: Generalized weakness    Lower Extremity Assessment  Lower Extremity Assessment: Generalized weakness    Cervical / Trunk Assessment Cervical / Trunk Assessment: Other exceptions Cervical / Trunk Exceptions: distended abdomen  Communication   Communication: No difficulties  Cognition Arousal/Alertness: Lethargic Behavior During Therapy: WFL for tasks assessed/performed Overall Cognitive Status:  Within Functional Limits for tasks assessed                                 General Comments: pt has to have blood sugars taken every hour, was up all night      General Comments General comments (skin integrity, edema, etc.): distended abdomen, HR increased from 100 to 134bpm during PT/OT evaluation    Exercises     Assessment/Plan    PT Assessment Patient needs continued PT services  PT Problem List Decreased strength;Decreased range of motion;Decreased activity tolerance;Decreased balance;Decreased mobility;Decreased coordination;Decreased cognition;Decreased knowledge of use of DME;Decreased safety awareness       PT Treatment Interventions DME instruction;Gait training;Stair training;Functional mobility training;Therapeutic activities;Therapeutic exercise;Balance training    PT Goals (Current goals can be found in the Care Plan section)  Acute Rehab PT Goals Patient Stated Goal: feel better PT Goal Formulation: With patient Time For Goal Achievement: 05/27/19 Potential to Achieve Goals: Good    Frequency Min 3X/week   Barriers to discharge        Co-evaluation PT/OT/SLP Co-Evaluation/Treatment: Yes Reason for Co-Treatment: For patient/therapist safety;Complexity of the patient's impairments (multi-system involvement) PT goals addressed during session: Mobility/safety with mobility         AM-PAC PT "6 Clicks" Mobility  Outcome Measure Help needed turning from your back to your side while in a flat bed without using bedrails?: A Lot Help needed moving from lying on your back to sitting on the side of a flat bed without using bedrails?: A Lot Help needed moving to and from a bed to a chair (including a wheelchair)?: A Lot Help needed standing up from a chair using your arms (e.g., wheelchair or bedside chair)?: A Lot Help needed to walk in hospital room?: A Lot Help needed climbing 3-5 steps with a railing? : Total 6 Click Score: 11    End of  Session Equipment Utilized During Treatment: Gait belt Activity Tolerance: Patient tolerated treatment well Patient left: in chair;with call bell/phone within reach Nurse Communication: Mobility status PT Visit Diagnosis: Unsteadiness on feet (R26.81);Muscle weakness (generalized) (M62.81);Difficulty in walking, not elsewhere classified (R26.2)    Time: 7517-0017 PT Time Calculation (min) (ACUTE ONLY): 23 min   Charges:   PT Evaluation $PT Eval Moderate Complexity: 1 Mod          Lewis Shock, PT, DPT Acute Rehabilitation Services Pager #: [phone] Office #: [phone]   Iona Hansen 05/13/2019, 1:08 PM

## 2019-05-13 NOTE — Plan of Care (Signed)

## 2019-05-13 NOTE — Progress Notes (Signed)
PT Cancellation Note  Patient Details Name: Caleb Chambers MRN: [id number] DOB: 09/04/1965   Cancelled Treatment:    Reason Eval/Treat Not Completed: Other (comment). RN asked to hold til afternoon as pt was up all night due to sugar checks every hour and remains to have severe abdominal pain. Acute PT to return as able to complete PT eval.  Lewis Shock, PT, DPT Acute Rehabilitation Services Pager #: [phone] Office #: [phone]    Iona Hansen 05/13/2019, 9:19 AM

## 2019-05-13 NOTE — Progress Notes (Signed)
Rehab Admissions Coordinator Note:  Per PT/OT recommendation, patient was screened by Stephania Fragmin for appropriateness for an Inpatient Acute Rehab Consult.  At this time, we are recommending Inpatient Rehab consult. I will place an order so we can evaluate pt for candidacy.   Stephania Fragmin 05/13/2019, 1:49 PM  I can be reached at [phone].

## 2019-05-13 NOTE — Progress Notes (Addendum)
Subjective: See below.  Objective: Vital signs in last 24 hours: Temp:  [97.8 F (36.6 C)-98.6 F (37 C)] 98.4 F (36.9 C) (01/27 1156) Pulse Rate:  [102-118] 106 (01/27 1156) Resp:  [17-35] 24 (01/27 1156) BP: (87-148)/(47-79) 148/74 (01/27 1156) SpO2:  [93 %-100 %] 93 % (01/27 1156) Last BM Date: 05/11/19  Intake/Output from previous day: 01/26 0701 - 01/27 0700 In: 4108.8 [I.V.:1798; IV Piggyback:2310.8] Out: 1300 [Urine:700; Emesis/NG output:600] Intake/Output this shift: Total I/O In: -  Out: 480 [Urine:480]  General appearance: somnolent, but arousable, weak, and soft spoken. GI: soft, non-tender; bowel sounds normal; no masses,  no organomegaly  Lab Results: Recent Labs    05/12/19 0950  WBC 6.4  HGB 10.7*  HCT 33.4*  PLT 282   BMET Recent Labs    05/12/19 2256 05/13/19 0308 05/13/19 1109  NA 129* 131* 132*  K 2.9* 2.4* 3.4*  CL 97* 100 99  CO2 20* 20* 18*  GLUCOSE 198* 207* 185*  BUN 46* 42* 35*  CREATININE 1.67* 1.49* 1.23  CALCIUM 11.3* 11.6* 11.5*   LFT Recent Labs    05/12/19 0953  PROT 6.9  ALBUMIN 1.9*  AST 15  ALT 17  ALKPHOS 81  BILITOT 1.3*   PT/INR No results for input(s): LABPROT, INR in the last 72 hours. Hepatitis Panel No results for input(s): HEPBSAG, HCVAB, HEPAIGM, HEPBIGM in the last 72 hours. C-Diff No results for input(s): CDIFFTOX in the last 72 hours. Fecal Lactopherrin No results for input(s): FECLLACTOFRN in the last 72 hours.  Studies/Results: DG Abd 1 View  Result Date: 05/13/2019 CLINICAL DATA:  Initial evaluation for acute nausea, vomiting, abdominal pain. EXAM: ABDOMEN - 1 VIEW COMPARISON:  Prior CT from 04/20/2019. FINDINGS: Bowel gas pattern is within normal limits without obstruction or ileus. No abnormal bowel wall thickening. No visible free air on the supine views of the abdomen. No soft tissue mass or abnormal calcification. Cholecystectomy clips noted within the right upper quadrant. No acute  osseous abnormality. Degenerative changes noted about the hips bilaterally. IMPRESSION: Nonobstructive bowel gas pattern with no radiographic evidence for acute intra-abdominal process. Electronically Signed   By: Rise Mu M.D.   On: 05/13/2019 01:01    Medications:  Scheduled: . allopurinol  300 mg Oral Daily  . enoxaparin (LOVENOX) injection  40 mg Subcutaneous Q24H  . pantoprazole (PROTONIX) IV  40 mg Intravenous Q12H   Continuous: . sodium chloride Stopped (05/12/19 1912)  . dextrose 5 % and 0.45% NaCl 125 mL/hr at 05/13/19 1213  . insulin 9 Units/hr (05/13/19 1307)  . potassium chloride      Assessment/Plan: 1) Persistent nausea and vomiting. 2) DKA - resolving. 3) Recent history of pancreatitis. 4) Malnutrition.   The patient's complaints of abdominal pain fluctuates.  As an outpatient he did not have any pancreatitis, but now he reports intermittent mid to upper abdominal pain.  He currently does not have pain, but he received morphine 2 mg at 11 AM.  His creatinine has markedly improved and it is at a point where a CT scan with IV contrast can be obtained.  Plan: 1) Continue with supportive care. 2) Await CT scan of the abdomen/pelvis. 3) Continue with pain control. 4) The patient will need some type of nutrition.  The preferred route is enterally.  He will be started on tube feeding once a feeding is tube is placed by IR.  LOS: 1 day   Charell Faulk D 05/13/2019, 1:14 PM

## 2019-05-13 NOTE — Progress Notes (Signed)
Dr. Elnoria Howard order for cortrak tube feeding, but Cortrak team was not working this time. Talked to radiologist regarding this matter, but he told that it was not emergency procedures, it will be tomorrow in the morning. Dr. Elnoria Howard made aware of this matter, no tube feeding today. HS [employer]

## 2019-05-14 ENCOUNTER — Inpatient Hospital Stay (HOSPITAL_COMMUNITY): Payer: BC Managed Care – PPO

## 2019-05-14 LAB — BASIC METABOLIC PANEL
Anion gap: 9 (ref 5–15)
Anion gap: 11 (ref 5–15)
BUN: 19 mg/dL (ref 6–20)
BUN: 18 mg/dL (ref 6–20)
CO2: 21 mmol/L — ABNORMAL LOW (ref 22–32)
CO2: 21 mmol/L — ABNORMAL LOW (ref 22–32)
Calcium: 10.2 mg/dL (ref 8.9–10.3)
Calcium: 10.5 mg/dL — ABNORMAL HIGH (ref 8.9–10.3)
Chloride: 103 mmol/L (ref 98–111)
Chloride: 101 mmol/L (ref 98–111)
Creatinine, Ser: 1.04 mg/dL (ref 0.61–1.24)
Creatinine, Ser: 0.98 mg/dL (ref 0.61–1.24)
GFR calc Af Amer: >60 mL/min (ref >60)
GFR calc Af Amer: >60 mL/min (ref >60)
GFR calc non Af Amer: >60 mL/min (ref >60)
GFR calc non Af Amer: >60 mL/min (ref >60)
Glucose, Bld: 207 mg/dL — ABNORMAL HIGH (ref 70–99)
Glucose, Bld: 211 mg/dL — ABNORMAL HIGH (ref 70–99)
Potassium: 3.1 mmol/L — ABNORMAL LOW (ref 3.5–5.1)
Potassium: 3.3 mmol/L — ABNORMAL LOW (ref 3.5–5.1)
Sodium: 133 mmol/L — ABNORMAL LOW (ref 135–145)
Sodium: 133 mmol/L — ABNORMAL LOW (ref 135–145)

## 2019-05-14 LAB — COMPREHENSIVE METABOLIC PANEL
ALT: 15 U/L (ref 0–44)
AST: 14 U/L — ABNORMAL LOW (ref 15–41)
Albumin: 1.5 g/dL — ABNORMAL LOW (ref 3.5–5.0)
Alkaline Phosphatase: 68 U/L (ref 38–126)
Anion gap: 8 (ref 5–15)
BUN: 22 mg/dL — ABNORMAL HIGH (ref 6–20)
CO2: 23 mmol/L (ref 22–32)
Calcium: 10.8 mg/dL — ABNORMAL HIGH (ref 8.9–10.3)
Chloride: 103 mmol/L (ref 98–111)
Creatinine, Ser: 1.03 mg/dL (ref 0.61–1.24)
GFR calc Af Amer: >60 mL/min (ref >60)
GFR calc non Af Amer: >60 mL/min (ref >60)
Glucose, Bld: 155 mg/dL — ABNORMAL HIGH (ref 70–99)
Potassium: 2.8 mmol/L — ABNORMAL LOW (ref 3.5–5.1)
Sodium: 134 mmol/L — ABNORMAL LOW (ref 135–145)
Total Bilirubin: 0.4 mg/dL (ref 0.3–1.2)
Total Protein: 6 g/dL — ABNORMAL LOW (ref 6.5–8.1)

## 2019-05-14 LAB — GLUCOSE, CAPILLARY
Glucose-Capillary: 114 mg/dL — ABNORMAL HIGH (ref 70–99)
Glucose-Capillary: 129 mg/dL — ABNORMAL HIGH (ref 70–99)
Glucose-Capillary: 140 mg/dL — ABNORMAL HIGH (ref 70–99)
Glucose-Capillary: 142 mg/dL — ABNORMAL HIGH (ref 70–99)
Glucose-Capillary: 159 mg/dL — ABNORMAL HIGH (ref 70–99)
Glucose-Capillary: 160 mg/dL — ABNORMAL HIGH (ref 70–99)
Glucose-Capillary: 160 mg/dL — ABNORMAL HIGH (ref 70–99)
Glucose-Capillary: 160 mg/dL — ABNORMAL HIGH (ref 70–99)
Glucose-Capillary: 160 mg/dL — ABNORMAL HIGH (ref 70–99)
Glucose-Capillary: 160 mg/dL — ABNORMAL HIGH (ref 70–99)
Glucose-Capillary: 165 mg/dL — ABNORMAL HIGH (ref 70–99)
Glucose-Capillary: 165 mg/dL — ABNORMAL HIGH (ref 70–99)
Glucose-Capillary: 172 mg/dL — ABNORMAL HIGH (ref 70–99)
Glucose-Capillary: 176 mg/dL — ABNORMAL HIGH (ref 70–99)
Glucose-Capillary: 183 mg/dL — ABNORMAL HIGH (ref 70–99)
Glucose-Capillary: 188 mg/dL — ABNORMAL HIGH (ref 70–99)
Glucose-Capillary: 193 mg/dL — ABNORMAL HIGH (ref 70–99)
Glucose-Capillary: 195 mg/dL — ABNORMAL HIGH (ref 70–99)
Glucose-Capillary: 212 mg/dL — ABNORMAL HIGH (ref 70–99)

## 2019-05-14 LAB — CBC
HCT: 32.9 % — ABNORMAL LOW (ref 39.0–52.0)
Hemoglobin: 10.7 g/dL — ABNORMAL LOW (ref 13.0–17.0)
MCH: 24.2 pg — ABNORMAL LOW (ref 26.0–34.0)
MCHC: 32.5 g/dL (ref 30.0–36.0)
MCV: 74.4 fL — ABNORMAL LOW (ref 80.0–100.0)
Platelets: 238 10*3/uL (ref 150–400)
RBC: 4.42 MIL/uL (ref 4.22–5.81)
RDW: 14.8 % (ref 11.5–15.5)
WBC: 8.3 10*3/uL (ref 4.0–10.5)
nRBC: 0.8 % — ABNORMAL HIGH (ref 0.0–0.2)

## 2019-05-14 LAB — PHOSPHORUS
Phosphorus: 1.6 mg/dL — ABNORMAL LOW (ref 2.5–4.6)
Phosphorus: 2 mg/dL — ABNORMAL LOW (ref 2.5–4.6)

## 2019-05-14 LAB — MAGNESIUM
Magnesium: 1.5 mg/dL — ABNORMAL LOW (ref 1.7–2.4)
Magnesium: 2 mg/dL (ref 1.7–2.4)

## 2019-05-14 MED ORDER — POTASSIUM CHLORIDE 10 MEQ/100ML IV SOLN
10.0000 meq | INTRAVENOUS | Status: DC
  Administered 2019-05-14 (×2): 10 meq via INTRAVENOUS
  Filled 2019-05-14 (×4): qty 100

## 2019-05-14 MED ORDER — MAGNESIUM SULFATE 2 GM/50ML IV SOLN
2.0000 g | Freq: Once | INTRAVENOUS | Status: AC
  Administered 2019-05-14: 2 g via INTRAVENOUS
  Filled 2019-05-14: qty 50

## 2019-05-14 MED ORDER — POTASSIUM PHOSPHATES 15 MMOLE/5ML IV SOLN
30.0000 mmol | Freq: Once | INTRAVENOUS | Status: AC
  Administered 2019-05-14: 30 mmol via INTRAVENOUS
  Filled 2019-05-14: qty 10

## 2019-05-14 MED ORDER — POTASSIUM CHLORIDE 2 MEQ/ML IV SOLN
INTRAVENOUS | Status: DC
  Filled 2019-05-14 (×5): qty 1000

## 2019-05-14 MED ORDER — LIDOCAINE VISCOUS HCL 2 % MT SOLN
6.0000 mL | Freq: Once | OROMUCOSAL | Status: AC
  Administered 2019-05-14: 6 mL via OROMUCOSAL

## 2019-05-14 MED ORDER — IOHEXOL 300 MG/ML  SOLN
25.0000 mL | Freq: Once | INTRAMUSCULAR | Status: AC | PRN
  Administered 2019-05-14: 25 mL

## 2019-05-14 NOTE — Progress Notes (Signed)
Inpatient Rehabilitation Admissions Coordinator  Rehabilitation consult received. I assessed patient at the bedside. Sleeping soundly. Tube feeding, Insulin IV and Dilaudid PCA. Not at a level that I can fully assess his rehab potential yet. I will follow his progress. Please call me with any questions.  Ottie Glazier, RN, MSN Rehab Admissions Coordinator [phone] 05/14/2019 1:57 PM

## 2019-05-14 NOTE — Progress Notes (Addendum)
Subjective: Feeling better with pain control.  Objective: Vital signs in last 24 hours: Temp:  [97.6 F (36.4 C)-99 F (37.2 C)] 98.1 F (36.7 C) (01/28 0654) Pulse Rate:  [102-114] 113 (01/28 0654) Resp:  [21-31] 21 (01/28 0826) BP: (92-148)/(58-74) 110/59 (01/28 0654) SpO2:  [93 %-99 %] 99 % (01/28 0826) Weight:  [133.3 kg] 133.3 kg (01/28 0334) Last BM Date: 05/11/19  Intake/Output from previous day: 01/27 0701 - 01/28 0700 In: 3108.1 [I.V.:2775.5; IV Piggyback:332.6] Out: 880 [Urine:880] Intake/Output this shift: No intake/output data recorded.  General appearance: somnolent with PCA, but arousable Resp: clear to auscultation bilaterally Cardio: regular rate and rhythm GI: soft, non-tender; bowel sounds normal; no masses,  no organomegaly Extremities: extremities normal, atraumatic, no cyanosis or edema  Lab Results: Recent Labs    05/12/19 0950 05/14/19 0525  WBC 6.4 8.3  HGB 10.7* 10.7*  HCT 33.4* 32.9*  PLT 282 238   BMET Recent Labs    05/13/19 1438 05/13/19 1821 05/14/19 0525  NA 134* 133* 134*  K 3.3* 2.9* 2.8*  CL 101 99 103  CO2 23 22 23   GLUCOSE 176* 159* 155*  BUN 33* 31* 22*  CREATININE 1.30* 1.16 1.03  CALCIUM 11.3* 11.2* 10.8*   LFT Recent Labs    05/14/19 0525  PROT 6.0*  ALBUMIN 1.5*  AST 14*  ALT 15  ALKPHOS 68  BILITOT 0.4   PT/INR No results for input(s): LABPROT, INR in the last 72 hours. Hepatitis Panel No results for input(s): HEPBSAG, HCVAB, HEPAIGM, HEPBIGM in the last 72 hours. C-Diff No results for input(s): CDIFFTOX in the last 72 hours. Fecal Lactopherrin No results for input(s): FECLLACTOFRN in the last 72 hours.  Studies/Results: DG Abd 1 View  Result Date: 05/14/2019 CLINICAL DATA:  Single fluoroscopic image from placement of a feeding tube. EXAM: ABDOMEN - 1 VIEW COMPARISON:  CT, 05/13/2019 FINDINGS: Tube tip projects at the ligament of Treitz. A small amount of contrast is seen in the proximal jejunum  just distal to the tube tip. IMPRESSION: Imaging provided for feeding tube placement. Single spot fluoro graphic image acquired. Feeding tube well positioned. Electronically Signed   By: Lajean Manes M.D.   On: 05/14/2019 10:45   DG Abd 1 View  Result Date: 05/13/2019 CLINICAL DATA:  Initial evaluation for acute nausea, vomiting, abdominal pain. EXAM: ABDOMEN - 1 VIEW COMPARISON:  Prior CT from 04/20/2019. FINDINGS: Bowel gas pattern is within normal limits without obstruction or ileus. No abnormal bowel wall thickening. No visible free air on the supine views of the abdomen. No soft tissue mass or abnormal calcification. Cholecystectomy clips noted within the right upper quadrant. No acute osseous abnormality. Degenerative changes noted about the hips bilaterally. IMPRESSION: Nonobstructive bowel gas pattern with no radiographic evidence for acute intra-abdominal process. Electronically Signed   By: Jeannine Boga M.D.   On: 05/13/2019 01:01   CT ABDOMEN PELVIS W CONTRAST  Result Date: 05/13/2019 CLINICAL DATA:  Intermittent abdominal pain. EXAM: CT ABDOMEN AND PELVIS WITH CONTRAST TECHNIQUE: Multidetector CT imaging of the abdomen and pelvis was performed using the standard protocol following bolus administration of intravenous contrast. CONTRAST:  120mL OMNIPAQUE IOHEXOL 300 MG/ML  SOLN COMPARISON:  April 20, 2019 FINDINGS: Lower chest: No acute abnormality. Hepatobiliary: No focal liver abnormality is seen. Diffuse fatty infiltration of the liver parenchyma is noted. Status post cholecystectomy. No biliary dilatation. Pancreas: There is marked severity peripancreatic inflammatory fat stranding. A very large (17.2 cm x 17.4 cm x 16.6  cm) collection of predominantly anterior peripancreatic fluid is seen. This extends from the region of the gastric fundus along the anterior aspect of the mid abdomen. Posteromedial extension is also seen, along the region surrounding the proximal portion of the  duodenum. Spleen: Normal in size without focal abnormality. Adrenals/Urinary Tract: Adrenal glands are unremarkable. Kidneys are normal, without renal calculi, focal lesion, or hydronephrosis. Bladder is unremarkable. Stomach/Bowel: Stomach is within normal limits. Appendix appears normal. No evidence of bowel wall thickening, distention, or inflammatory changes. Vascular/Lymphatic: There is mild calcification of the left common iliac artery. No enlarged abdominal or pelvic lymph nodes. Reproductive: Prostate is unremarkable. Other: No abdominal wall hernia or abnormality. Musculoskeletal: No acute or significant osseous findings. IMPRESSION: 1. Marked severity acute pancreatitis with associated very large peripancreatic fluid collection. 2. Fatty liver. 3. Evidence of prior cholecystectomy. Electronically Signed   By: Aram Candela M.D.   On: 05/13/2019 20:46    Medications:  Scheduled: . allopurinol  300 mg Oral Daily  . enoxaparin (LOVENOX) injection  40 mg Subcutaneous Q24H  . HYDROmorphone   Intravenous Q4H  . pantoprazole (PROTONIX) IV  40 mg Intravenous Q12H   Continuous: . dextrose 5 %-0.45% NaCl with KCl/Additives Pediatric custom IV fluid    . feeding supplement (VITAL AF 1.2 CAL)    . insulin 1 Units/hr (05/14/19 0629)  . potassium PHOSPHATE IVPB (in mmol) 30 mmol (05/14/19 0936)    Assessment/Plan: 1) Acute pancreatitis with fluid collection. 2) DM - much better controlled.  DKA resolved. 3) ABM pain - controlled with PCA. 4) Malnutrition.   The patient is s/p feeding tube placement.  The KUB shows that the tube placement is in excellent position.  He will be started on tube feedings today.  His pain is much better controlled with the PCA and he is able to rest.  The CT scan was positive for a worsening of his pancreatitis and the development of a fluid collection.  There is no evidence of pancreatic necrosis.  The patient's mother was updated on his son's situation as well as  his brother Casimiro Needle [phone].  Plan: 1) Tube feedings. 2) PCA. 3) Continue with diabetic control.   LOS: 2 days   Branna Cortina D 05/14/2019, 11:27 AM

## 2019-05-14 NOTE — Progress Notes (Signed)
PROGRESS NOTE  Caleb Chambers IRW:[id number] DOB: 02-Sep-1965 DOA: 05/12/2019 PCP: Patient, No Pcp Per   LOS: 2 days   Brief narrative: As per HPI,  Caleb Chambers is a 54 y.o. male with medical history significant of HTN, DM, gout; and recent admission for pancreatitis and DKA  presented to the hospital with complaints of minimal oral intake since his discharge.  Patient reported shortness of breath and decreased ability to eat and drink.  He has not been fully compliant with insulin regimen as well.  Patient was then admitted to the hospital again for DKA and poor oral intolerance.  Assessment/Plan:  Principal Problem:   DKA (diabetic ketoacidosis) (HCC) Active Problems:   Acute pancreatitis   Acute kidney injury (HCC)   Morbid obesity (HCC)   Debility   Protein-calorie malnutrition, severe  Recurrent DKA -Secondary to ongoing nausea, vomiting and poor intolerance, noncompliance to insulin regimen.  Hemoglobin A1c was 13.2 on 1/6, signifying poorly controlled diabetes.  Currently on insulin drip.  He is currently n.p.o. and complaining of abdominal pain nausea vomiting.  Continue IV fluids with dextrose and insulin drip for now.  Continue n.p.o. to  Intractable abdominal pain nausea vomiting with severe pancreatitis.[name] History of cholecystectomy in the past.  AST ALT within normal limits.  Patient was started on morphine yesterday but despite that is pain was a severe and persistent.  Has been started on Dilaudid pump overnight.  Continue IV Protonix.  Seen by GI.  CT scan of the abdomen and pelvis was performed which showed severe pancreatitis with large pancreatic fluid collection with dimensions of 17.2 x 17.4 x 16.6 cm.  Will follow GI recommendations.  GI has recommended tube for feeding.  IR has been consulted.  Hypokalemia Potassium 2.8 today.  Continue IV potassium.  Patient will also receive IV potassium phosphate.  Continue to monitor closely.  BMP in the evening and  a.m.  Hypomagnesemia.  Will replenish IV magnesium sulfate.  Check magnesium levels in a.m.  Hypophosphatemia.  Will replenish with potassium phosphate.  Check magnesium and phosphorus levels in a.m.  Acute kidney injury on presentation.  Likely secondary to volume depletion poor oral intake and use of ACE inhibitor's and diuretics.  Continue to hold ACE inhibitor's and diuretics.  Continue IV fluids.   Latest creatinine of 1.03.  Monitor closely  Essential hypertension Patient was on Zestoretic and Norvasc at home.  Will continue to hold for now.    Morbid obesity -BMI 43.4, life style modification to be emphasized on discharge.  Debility and deconditioning. Patient was seen by physical therapy, recommend CIR  VTE Prophylaxis: Lovenox subq  Code Status:  Full code  Family Communication: Spoke with the patient's mother Ms. Brooks Sailors on the phone and updated her about the clinical condition of the patient.  Disposition Plan: CIR.  Patient is very sick with acute pancreatitis.  Patient will likely be in the hospital for several days prior to discharge.  Will follow GI recommendations  Consultants:  GI  Interventional radiology  Procedures:  None  Antibiotics:  Anti-infectives (From admission, onward)   None     Subjective:  Today, patient was seen and examined at bedside.  He complains of epigastric pain but better with Dilaudid drip.  Denied ongoing nausea or vomiting.  Has not had a bowel movement in 2 to 3 days.     Objective: Vitals:   05/14/19 0654 05/14/19 0826  BP: (!) 110/59   Pulse: (!) 113  Resp: (!) 21 (!) 21  Temp: 98.1 F (36.7 C)   SpO2: 99% 99%    Intake/Output Summary (Last 24 hours) at 05/14/2019 1106 Last data filed at 05/14/2019 0400 Gross per 24 hour  Intake 2854.36 ml  Output 400 ml  Net 2454.36 ml   Filed Weights   05/12/19 0946 05/14/19 0334  Weight: (!) 145.2 kg 133.3 kg   Body mass index is 39.86 kg/m.   Physical  Exam:  GENERAL: Patient is mildly sedated but communicative, morbidly obese, HENT: No scleral pallor or icterus. Pupils equally reactive to light. Oral mucosa is mildly dry. NECK: is supple, no palpable thyroid enlargement. CHEST: Clear to auscultation. No crackles or wheezes. Non tender on palpation. Diminished breath sounds bilaterally. CVS: S1 and S2 heard, no murmur. Regular rate and rhythm.  Tachycardic. ABDOMEN: Soft,epigastric tenderness on palpation EXTREMITIES: No edema. CNS: Cranial nerves are intact. No focal motor or sensory deficits. SKIN: warm and dry without rashes.   Data Review: I have personally reviewed the following laboratory data and studies,  CBC: Recent Labs  Lab 05/12/19 0950 05/14/19 0525  WBC 6.4 8.3  HGB 10.7* 10.7*  HCT 33.4* 32.9*  MCV 77.1* 74.4*  PLT 282 322   Basic Metabolic Panel: Recent Labs  Lab 05/13/19 0308 05/13/19 1109 05/13/19 1438 05/13/19 1821 05/14/19 0525  NA 131* 132* 134* 133* 134*  K 2.4* 3.4* 3.3* 2.9* 2.8*  CL 100 99 101 99 103  CO2 20* 18* 23 22 23   GLUCOSE 207* 185* 176* 159* 155*  BUN 42* 35* 33* 31* 22*  CREATININE 1.49* 1.23 1.30* 1.16 1.03  CALCIUM 11.6* 11.5* 11.3* 11.2* 10.8*  MG  --   --  1.7  --  1.5*  PHOS  --   --  1.6*  --  1.6*   Liver Function Tests: Recent Labs  Lab 05/12/19 0953 05/14/19 0525  AST 15 14*  ALT 17 15  ALKPHOS 81 68  BILITOT 1.3* 0.4  PROT 6.9 6.0*  ALBUMIN 1.9* 1.5*   Recent Labs  Lab 05/12/19 0953  LIPASE 49   No results for input(s): AMMONIA in the last 168 hours. Cardiac Enzymes: No results for input(s): CKTOTAL, CKMB, CKMBINDEX, TROPONINI in the last 168 hours. BNP (last 3 results) Recent Labs    04/23/19 0450  BNP 99.2    ProBNP (last 3 results) No results for input(s): PROBNP in the last 8760 hours.  CBG: Recent Labs  Lab 05/13/19 2037 05/13/19 2131 05/13/19 2311 05/14/19 0015 05/14/19 0121  GLUCAP 165* 188* 159* 160* 160*   Recent Results (from  the past 240 hour(s))  SARS CORONAVIRUS 2 (TAT 6-24 HRS) Nasopharyngeal Nasopharyngeal Swab     Status: None   Collection Time: 05/12/19  2:36 PM   Specimen: Nasopharyngeal Swab  Result Value Ref Range Status   SARS Coronavirus 2 NEGATIVE NEGATIVE Final    Comment: (NOTE) SARS-CoV-2 target nucleic acids are NOT DETECTED. The SARS-CoV-2 RNA is generally detectable in upper and lower respiratory specimens during the acute phase of infection. Negative results do not preclude SARS-CoV-2 infection, do not rule out co-infections with other pathogens, and should not be used as the sole basis for treatment or other patient management decisions. Negative results must be combined with clinical observations, patient history, and epidemiological information. The expected result is Negative. Fact Sheet for Patients: SugarRoll.be Fact Sheet for Healthcare Providers: https://www.woods-mathews.com/ This test is not yet approved or cleared by the [place] FDA and  has been  authorized for detection and/or diagnosis of SARS-CoV-2 by FDA under an Emergency Use Authorization (EUA). This EUA will remain  in effect (meaning this test can be used) for the duration of the COVID-19 declaration under Section 56 4(b)(1) of the Act, 21 U.S.C. section 360bbb-3(b)(1), unless the authorization is terminated or revoked sooner. Performed at [facility], [address], [place], [place] [address]   MRSA PCR Screening     Status: None   Collection Time: 05/13/19  7:29 PM   Specimen: Nasal Mucosa; Nasopharyngeal  Result Value Ref Range Status   MRSA by PCR NEGATIVE NEGATIVE Final    Comment:        The GeneXpert MRSA Assay (FDA approved for NASAL specimens only), is one component of a comprehensive MRSA colonization surveillance program. It is not intended to diagnose MRSA infection nor to guide or monitor treatment for MRSA infections. Performed at [facility], [address] [place], [place] [address]      Studies: DG Abd 1 View  Result Date: 05/14/2019 CLINICAL DATA:  Single fluoroscopic image from placement of a feeding tube. EXAM: ABDOMEN - 1 VIEW COMPARISON:  CT, 05/13/2019 FINDINGS: Tube tip projects at the ligament of Treitz. A small amount of contrast is seen in the proximal jejunum just distal to the tube tip. IMPRESSION: Imaging provided for feeding tube placement. Single spot fluoro graphic image acquired. Feeding tube well positioned. Electronically Signed   By: Amie Portland M.D.   On: 05/14/2019 10:45   DG Abd 1 View  Result Date: 05/13/2019 CLINICAL DATA:  Initial evaluation for acute nausea, vomiting, abdominal pain. EXAM: ABDOMEN - 1 VIEW COMPARISON:  Prior CT from 04/20/2019. FINDINGS: Bowel gas pattern is within normal limits without obstruction or ileus. No abnormal bowel wall thickening. No visible free air on the supine views of the abdomen. No soft tissue mass or abnormal calcification. Cholecystectomy clips noted within the right upper quadrant. No acute osseous abnormality. Degenerative changes noted about the hips bilaterally. IMPRESSION: Nonobstructive bowel gas pattern with no radiographic evidence for acute intra-abdominal process. Electronically Signed   By: Rise Mu M.D.   On: 05/13/2019 01:01   CT ABDOMEN PELVIS W CONTRAST  Result Date: 05/13/2019 CLINICAL DATA:  Intermittent abdominal pain. EXAM: CT ABDOMEN AND PELVIS WITH CONTRAST TECHNIQUE: Multidetector CT imaging of the abdomen and pelvis was performed using the standard protocol following bolus administration of intravenous contrast. CONTRAST:  OMNIPAQUE IOHEXOL 300 MG/ML  SOLN COMPARISON:  April 20, 2019 FINDINGS: Lower chest: No acute abnormality. Hepatobiliary: No focal liver abnormality is seen. Diffuse fatty infiltration of the liver parenchyma is noted. Status post cholecystectomy. No biliary dilatation. Pancreas: There is marked  severity peripancreatic inflammatory fat stranding. A very large (17.2 cm x 17.4 cm x 16.6 cm) collection of predominantly anterior peripancreatic fluid is seen. This extends from the region of the gastric fundus along the anterior aspect of the mid abdomen. Posteromedial extension is also seen, along the region surrounding the proximal portion of the duodenum. Spleen: Normal in size without focal abnormality. Adrenals/Urinary Tract: Adrenal glands are unremarkable. Kidneys are normal, without renal calculi, focal lesion, or hydronephrosis. Bladder is unremarkable. Stomach/Bowel: Stomach is within normal limits. Appendix appears normal. No evidence of bowel wall thickening, distention, or inflammatory changes. Vascular/Lymphatic: There is mild calcification of the left common iliac artery. No enlarged abdominal or pelvic lymph nodes. Reproductive: Prostate is unremarkable. Other: No abdominal wall hernia or abnormality. Musculoskeletal: No acute or significant  osseous findings. IMPRESSION: 1. Marked severity acute pancreatitis with associated very large peripancreatic fluid collection. 2. Fatty liver. 3. Evidence of prior cholecystectomy. Electronically Signed   By: Aram Candela M.D.   On: 05/13/2019 20:46    Scheduled Meds: . allopurinol  300 mg Oral Daily  . enoxaparin (LOVENOX) injection  40 mg Subcutaneous Q24H  . HYDROmorphone   Intravenous Q4H  . pantoprazole (PROTONIX) IV  40 mg Intravenous Q12H    Continuous Infusions: . sodium chloride Stopped (05/12/19 1912)  . dextrose 5 % and 0.45% NaCl 125 mL/hr at 05/14/19 0849  . feeding supplement (VITAL AF 1.2 CAL)    . insulin 1 Units/hr (05/14/19 0629)  . potassium chloride 10 mEq (05/14/19 0956)  . potassium PHOSPHATE IVPB (in mmol) 30 mmol (05/14/19 0936)     Joycelyn Das, MD  [facility] 05/14/2019

## 2019-05-14 NOTE — Progress Notes (Signed)
Cortrak Tube Team Note:  Received phone call from Diagnostic Radiology requesting bridle retainement placement  Radiology placed 10 french small bore feeding tube in LEFT nare. Bridle successfully inserted and tube clipped in place around the 105 cm mark.  [employer] MS, RDN, LDN, CNSC [phone] Pager  [phone] Weekend/On-Call Pager

## 2019-05-15 LAB — CBC
HCT: 29.3 % — ABNORMAL LOW (ref 39.0–52.0)
Hemoglobin: 9.8 g/dL — ABNORMAL LOW (ref 13.0–17.0)
MCH: 24.6 pg — ABNORMAL LOW (ref 26.0–34.0)
MCHC: 33.4 g/dL (ref 30.0–36.0)
MCV: 73.6 fL — ABNORMAL LOW (ref 80.0–100.0)
Platelets: 178 10*3/uL (ref 150–400)
RBC: 3.98 MIL/uL — ABNORMAL LOW (ref 4.22–5.81)
RDW: 15.1 % (ref 11.5–15.5)
WBC: 7.2 10*3/uL (ref 4.0–10.5)
nRBC: 0.7 % — ABNORMAL HIGH (ref 0.0–0.2)

## 2019-05-15 LAB — GLUCOSE, CAPILLARY
Glucose-Capillary: 141 mg/dL — ABNORMAL HIGH (ref 70–99)
Glucose-Capillary: 163 mg/dL — ABNORMAL HIGH (ref 70–99)
Glucose-Capillary: 168 mg/dL — ABNORMAL HIGH (ref 70–99)
Glucose-Capillary: 176 mg/dL — ABNORMAL HIGH (ref 70–99)
Glucose-Capillary: 182 mg/dL — ABNORMAL HIGH (ref 70–99)
Glucose-Capillary: 182 mg/dL — ABNORMAL HIGH (ref 70–99)
Glucose-Capillary: 184 mg/dL — ABNORMAL HIGH (ref 70–99)
Glucose-Capillary: 187 mg/dL — ABNORMAL HIGH (ref 70–99)
Glucose-Capillary: 204 mg/dL — ABNORMAL HIGH (ref 70–99)
Glucose-Capillary: 282 mg/dL — ABNORMAL HIGH (ref 70–99)
Glucose-Capillary: 314 mg/dL — ABNORMAL HIGH (ref 70–99)
Glucose-Capillary: 334 mg/dL — ABNORMAL HIGH (ref 70–99)
Glucose-Capillary: 348 mg/dL — ABNORMAL HIGH (ref 70–99)
Glucose-Capillary: 363 mg/dL — ABNORMAL HIGH (ref 70–99)

## 2019-05-15 LAB — COMPREHENSIVE METABOLIC PANEL
ALT: 14 U/L (ref 0–44)
AST: 11 U/L — ABNORMAL LOW (ref 15–41)
Albumin: 1.4 g/dL — ABNORMAL LOW (ref 3.5–5.0)
Alkaline Phosphatase: 74 U/L (ref 38–126)
Anion gap: 7 (ref 5–15)
BUN: 16 mg/dL (ref 6–20)
CO2: 22 mmol/L (ref 22–32)
Calcium: 9.8 mg/dL (ref 8.9–10.3)
Chloride: 104 mmol/L (ref 98–111)
Creatinine, Ser: 0.74 mg/dL (ref 0.61–1.24)
GFR calc Af Amer: >60 mL/min (ref >60)
GFR calc non Af Amer: >60 mL/min (ref >60)
Glucose, Bld: 199 mg/dL — ABNORMAL HIGH (ref 70–99)
Potassium: 3.3 mmol/L — ABNORMAL LOW (ref 3.5–5.1)
Sodium: 133 mmol/L — ABNORMAL LOW (ref 135–145)
Total Bilirubin: 0.4 mg/dL (ref 0.3–1.2)
Total Protein: 5.7 g/dL — ABNORMAL LOW (ref 6.5–8.1)

## 2019-05-15 LAB — PHOSPHORUS: Phosphorus: 1.3 mg/dL — ABNORMAL LOW (ref 2.5–4.6)

## 2019-05-15 LAB — BASIC METABOLIC PANEL
Anion gap: 9 (ref 5–15)
BUN: 14 mg/dL (ref 6–20)
CO2: 20 mmol/L — ABNORMAL LOW (ref 22–32)
Calcium: 9.9 mg/dL (ref 8.9–10.3)
Chloride: 102 mmol/L (ref 98–111)
Creatinine, Ser: 0.99 mg/dL (ref 0.61–1.24)
GFR calc Af Amer: >60 mL/min (ref >60)
GFR calc non Af Amer: >60 mL/min (ref >60)
Glucose, Bld: 382 mg/dL — ABNORMAL HIGH (ref 70–99)
Potassium: 4.3 mmol/L (ref 3.5–5.1)
Sodium: 131 mmol/L — ABNORMAL LOW (ref 135–145)

## 2019-05-15 LAB — MAGNESIUM: Magnesium: 1.9 mg/dL (ref 1.7–2.4)

## 2019-05-15 MED ORDER — HYDROMORPHONE 1 MG/ML IV SOLN
INTRAVENOUS | Status: DC
  Administered 2019-05-15: 0 mg via INTRAVENOUS
  Administered 2019-05-16: 7 mg via INTRAVENOUS
  Administered 2019-05-16: 0 mg via INTRAVENOUS

## 2019-05-15 MED ORDER — INSULIN ASPART 100 UNIT/ML ~~LOC~~ SOLN
4.0000 [IU] | SUBCUTANEOUS | Status: DC
  Administered 2019-05-15 – 2019-05-16 (×5): 4 [IU] via SUBCUTANEOUS

## 2019-05-15 MED ORDER — MAGNESIUM SULFATE 2 GM/50ML IV SOLN
2.0000 g | Freq: Once | INTRAVENOUS | Status: AC
  Administered 2019-05-15: 2 g via INTRAVENOUS
  Filled 2019-05-15: qty 50

## 2019-05-15 MED ORDER — POTASSIUM PHOSPHATES 15 MMOLE/5ML IV SOLN
30.0000 mmol | Freq: Once | INTRAVENOUS | Status: AC
  Administered 2019-05-15: 30 mmol via INTRAVENOUS
  Filled 2019-05-15: qty 10

## 2019-05-15 MED ORDER — INSULIN GLARGINE 100 UNIT/ML ~~LOC~~ SOLN
35.0000 [IU] | Freq: Every day | SUBCUTANEOUS | Status: DC
  Administered 2019-05-15: 35 [IU] via SUBCUTANEOUS
  Filled 2019-05-15 (×2): qty 0.35

## 2019-05-15 MED ORDER — INSULIN ASPART 100 UNIT/ML ~~LOC~~ SOLN
0.0000 [IU] | SUBCUTANEOUS | Status: DC
  Administered 2019-05-15: 11 [IU] via SUBCUTANEOUS
  Administered 2019-05-15: 15 [IU] via SUBCUTANEOUS
  Administered 2019-05-15 – 2019-05-16 (×3): 11 [IU] via SUBCUTANEOUS
  Administered 2019-05-16: 5 [IU] via SUBCUTANEOUS

## 2019-05-15 NOTE — Progress Notes (Addendum)
PROGRESS NOTE  [name]:[id number] DOB: 12-23-1965 DOA: 05/12/2019 PCP: Patient, No Pcp Per   LOS: 3 days   Brief narrative: As per HPI,  Caleb Chambers is a 54 y.o. male with medical history significant of HTN, DM, gout; and recent admission for pancreatitis and DKA  presented to the hospital with complaints of minimal oral intake since his discharge.  Patient reported shortness of breath and decreased ability to eat and drink.  He has not been fully compliant with insulin regimen as well.  Patient was then admitted to the hospital again for DKA and poor oral intolerance.  Assessment/Plan:  Principal Problem:   DKA (diabetic ketoacidosis) ([place]) Active Problems:   Acute pancreatitis   Acute kidney injury ([place])   Morbid obesity ([facility])   Debility   Protein-calorie malnutrition, severe  Recurrent DKA -Secondary to ongoing nausea, vomiting and poor intolerance, noncompliance to insulin regimen.  Hemoglobin A1c was 13.2 on 1/6, signifying poorly controlled diabetes. Currently on insulin drip.  Patient has been transitioned to tube feeding so we will discontinue insulin drip and put the patient on subcu insulin.  Seen by diabetic coordinator.  Intractable abdominal pain, nausea vomiting with severe pancreatitis.Boyce Medici on Dilaudid pump.  Patient was counseled about narcotics.  History of cholecystectomy in the past.  AST ALT within normal limits.  Continue IV Protonix.   CT scan of the abdomen and pelvis was performed which showed severe pancreatitis with large pancreatic fluid collection with dimensions of 17.2 x 17.4 x 16.6 cm. Status post IR guided feeding tube placement.  GI on board.  Communicated with GI yesterday and will continue current level of treatment.  Hypokalemia Patient with 3.3 today.  Patient has been receiving potassium with IV fluids.  On insulin drip as well.  Continue to monitor closely and replace as necessary.  Hypomagnesemia.  Received IV magnesium  sulfate.  Magnesium of 1.9 today.  Hypophosphatemia.  replenished potassium phosphate.  Phosphate of 1.3 today.  We will continue to replenish  Acute kidney injury on presentation.  Likely secondary to volume depletion poor oral intake and use of ACE inhibitor's and diuretics.  Continue to hold ACE inhibitor's and diuretics. Improved creatinine levels.  0.7 today.  Essential hypertension Patient was on Zestoretic and Norvasc at home.  Will continue to hold for now.    Morbid obesity -BMI 43.4, life style modification to be emphasized on discharge.  Debility and deconditioning. Patient was seen by physical therapy, recommend CIR  Severe protein calorie malnutrition present on admission.  Nutrition on board.  On tube feeding at this time.  VTE Prophylaxis: Lovenox subq  Code Status:  Full code  Family Communication: None today.  Spoke with the patient's mother yesterday  Disposition Plan: CIR. and clinical improvement.  Patient will likely be in the hospital for several days  Consultants:  GI  Interventional radiology  Procedures:  Feeding tube placement 05/14/2019  Antibiotics:  Anti-infectives (From admission, onward)   None     Subjective:  Today states that his pain is better controlled with Dilaudid pump.  Had more pain during the night.  No nausea vomiting but has not had a bowel movement.  He has been passing gas.  Tolerating tube feeding.   Objective: Vitals:   05/15/19 0428 05/15/19 0435  BP:  (!) 111/56  Pulse:  (!) 106  Resp: 18 (!) 26  Temp:  97.7 F (36.5 C)  SpO2: 99%     Intake/Output Summary (Last 24 hours)  at 05/15/2019 0727 Last data filed at 05/14/2019 1916 Gross per 24 hour  Intake 1093.03 ml  Output 300 ml  Net 793.03 ml   Filed Weights   05/12/19 0946 05/14/19 0334 05/15/19 0435  Weight: (!) 145.2 kg 133.3 kg 134.1 kg   Body mass index is 40.1 kg/m.   Physical Exam:  GENERAL: Patient is alert awake communicative, but has  weak voice. morbidly obese, feeding tube in place. HENT: No scleral pallor or icterus. Pupils equally reactive to light. Oral mucosa is moist NECK: is supple, no palpable thyroid enlargement. CHEST: Clear to auscultation. No crackles or wheezes. Non tender on palpation. Diminished breath sounds bilaterally. CVS: S1 and S2 heard, no murmur. Regular rate and rhythm.  Tachycardic. ABDOMEN: Soft distended abdomen but was not tender on palpation today. EXTREMITIES: No edema. CNS: Cranial nerves are intact. No focal motor or sensory deficits. SKIN: warm and dry without rashes.   Data Review: I have personally reviewed the following laboratory data and studies,  CBC: Recent Labs  Lab 05/12/19 0950 05/14/19 0525  WBC 6.4 8.3  HGB 10.7* 10.7*  HCT 33.4* 32.9*  MCV 77.1* 74.4*  PLT 282 238   Basic Metabolic Panel: Recent Labs  Lab 05/13/19 1438 05/13/19 1821 05/14/19 0525 05/14/19 1158 05/14/19 2003  NA 134* 133* 134* 133* 133*  K 3.3* 2.9* 2.8* 3.1* 3.3*  CL 101 99 103 103 101  CO2 23 22 23  21* 21*  GLUCOSE 176* 159* 155* 207* 211*  BUN 33* 31* 22* 19 18  CREATININE 1.30* 1.16 1.03 1.04 0.98  CALCIUM 11.3* 11.2* 10.8* 10.2 10.5*  MG 1.7  --  1.5*  --  2.0  PHOS 1.6*  --  1.6*  --  2.0*   Liver Function Tests: Recent Labs  Lab 05/12/19 0953 05/14/19 0525  AST 15 14*  ALT 17 15  ALKPHOS 81 68  BILITOT 1.3* 0.4  PROT 6.9 6.0*  ALBUMIN 1.9* 1.5*   Recent Labs  Lab 05/12/19 0953  LIPASE 49   No results for input(s): AMMONIA in the last 168 hours. Cardiac Enzymes: No results for input(s): CKTOTAL, CKMB, CKMBINDEX, TROPONINI in the last 168 hours. BNP (last 3 results) Recent Labs    04/23/19 0450  BNP 99.2    ProBNP (last 3 results) No results for input(s): PROBNP in the last 8760 hours.  CBG: Recent Labs  Lab 05/15/19 0305 05/15/19 0403 05/15/19 0508 05/15/19 0603 05/15/19 0710  GLUCAP 204* 141* 163* 184* 182*   Recent Results (from the past 240  hour(s))  SARS CORONAVIRUS 2 (TAT 6-24 HRS) Nasopharyngeal Nasopharyngeal Swab     Status: None   Collection Time: 05/12/19  2:36 PM   Specimen: Nasopharyngeal Swab  Result Value Ref Range Status   SARS Coronavirus 2 NEGATIVE NEGATIVE Final    Comment: (NOTE) SARS-CoV-2 target nucleic acids are NOT DETECTED. The SARS-CoV-2 RNA is generally detectable in upper and lower respiratory specimens during the acute phase of infection. Negative results do not preclude SARS-CoV-2 infection, do not rule out co-infections with other pathogens, and should not be used as the sole basis for treatment or other patient management decisions. Negative results must be combined with clinical observations, patient history, and epidemiological information. The expected result is Negative. Fact Sheet for Patients: 05/14/19 Fact Sheet for Healthcare Providers: HairSlick.no This test is not yet approved or cleared by the quierodirigir.com FDA and  has been authorized for detection and/or diagnosis of SARS-CoV-2 by FDA under an Emergency  Use Authorization (EUA). This EUA will remain  in effect (meaning this test can be used) for the duration of the COVID-19 declaration under Section 56 4(b)(1) of the Act, 21 U.S.C. section 360bbb-3(b)(1), unless the authorization is terminated or revoked sooner. Performed at [facility], [address] [place], [place] [address]   MRSA PCR Screening     Status: None   Collection Time: 05/13/19  7:29 PM   Specimen: Nasal Mucosa; Nasopharyngeal  Result Value Ref Range Status   MRSA by PCR NEGATIVE NEGATIVE Final    Comment:        The GeneXpert MRSA Assay (FDA approved for NASAL specimens only), is one component of a comprehensive MRSA colonization surveillance program. It is not intended to diagnose MRSA infection nor to guide or monitor treatment for MRSA infections. Performed at [facility]  [facility], [address] [place], [place] [address]      Studies: DG Abd 1 View  Result Date: 05/14/2019 CLINICAL DATA:  Single fluoroscopic image from placement of a feeding tube. EXAM: ABDOMEN - 1 VIEW COMPARISON:  CT, 05/13/2019 FINDINGS: Tube tip projects at the ligament of Treitz. A small amount of contrast is seen in the proximal jejunum just distal to the tube tip. IMPRESSION: Imaging provided for feeding tube placement. Single spot fluoro graphic image acquired. Feeding tube well positioned. Electronically Signed   By: Amie Portland M.D.   On: 05/14/2019 10:45   CT ABDOMEN PELVIS W CONTRAST  Result Date: 05/13/2019 CLINICAL DATA:  Intermittent abdominal pain. EXAM: CT ABDOMEN AND PELVIS WITH CONTRAST TECHNIQUE: Multidetector CT imaging of the abdomen and pelvis was performed using the standard protocol following bolus administration of intravenous contrast. CONTRAST:  OMNIPAQUE IOHEXOL 300 MG/ML  SOLN COMPARISON:  April 20, 2019 FINDINGS: Lower chest: No acute abnormality. Hepatobiliary: No focal liver abnormality is seen. Diffuse fatty infiltration of the liver parenchyma is noted. Status post cholecystectomy. No biliary dilatation. Pancreas: There is marked severity peripancreatic inflammatory fat stranding. A very large (17.2 cm x 17.4 cm x 16.6 cm) collection of predominantly anterior peripancreatic fluid is seen. This extends from the region of the gastric fundus along the anterior aspect of the mid abdomen. Posteromedial extension is also seen, along the region surrounding the proximal portion of the duodenum. Spleen: Normal in size without focal abnormality. Adrenals/Urinary Tract: Adrenal glands are unremarkable. Kidneys are normal, without renal calculi, focal lesion, or hydronephrosis. Bladder is unremarkable. Stomach/Bowel: Stomach is within normal limits. Appendix appears normal. No evidence of bowel wall thickening, distention, or inflammatory changes. Vascular/Lymphatic: There is mild  calcification of the left common iliac artery. No enlarged abdominal or pelvic lymph nodes. Reproductive: Prostate is unremarkable. Other: No abdominal wall hernia or abnormality. Musculoskeletal: No acute or significant osseous findings. IMPRESSION: 1. Marked severity acute pancreatitis with associated very large peripancreatic fluid collection. 2. Fatty liver. 3. Evidence of prior cholecystectomy. Electronically Signed   By: Aram Candela M.D.   On: 05/13/2019 20:46    Scheduled Meds: . allopurinol  300 mg Oral Daily  . enoxaparin (LOVENOX) injection  40 mg Subcutaneous Q24H  . HYDROmorphone   Intravenous Q4H  . pantoprazole (PROTONIX) IV  40 mg Intravenous Q12H    Continuous Infusions: . dextrose 5 %-0.45% NaCl with KCl/Additives Pediatric custom IV fluid 125 mL/hr at 05/15/19 0629  . feeding supplement (VITAL AF 1.2 CAL) 1,000 mL (05/14/19 1837)  . insulin 9.5 Units/hr (05/14/19 2219)     Joycelyn Das, MD  [facility] 05/15/2019

## 2019-05-15 NOTE — Progress Notes (Signed)
Subjective: Feeling better.  Objective: Vital signs in last 24 hours: Temp:  [97.5 F (36.4 C)-99 F (37.2 C)] 97.5 F (36.4 C) (01/29 0741) Pulse Rate:  [106-121] 109 (01/29 0741) Resp:  [18-29] 23 (01/29 0932) BP: (98-111)/(56-76) 108/73 (01/29 0741) SpO2:  [95 %-100 %] 99 % (01/29 0932) Weight:  [134.1 kg] 134.1 kg (01/29 0435) Last BM Date: 05/11/19  Intake/Output from previous day: 01/28 0701 - 01/29 0700 In: 1093 [I.V.:543; IV Piggyback:550] Out: 300 [Urine:300] Intake/Output this shift: No intake/output data recorded.  General appearance: somnolent, easily arousable, soft spoken Resp: clear to auscultation bilaterally Cardio: regular rate and rhythm GI: some upper abdominal tenderness Extremities: extremities normal, atraumatic, no cyanosis or edema  Lab Results: Recent Labs    05/14/19 0525 05/15/19 0647  WBC 8.3 7.2  HGB 10.7* 9.8*  HCT 32.9* 29.3*  PLT 238 178   BMET Recent Labs    05/14/19 1158 05/14/19 2003 05/15/19 0647  NA 133* 133* 133*  K 3.1* 3.3* 3.3*  CL 103 101 104  CO2 21* 21* 22  GLUCOSE 207* 211* 199*  BUN 19 18 16   CREATININE 1.04 0.98 0.74  CALCIUM 10.2 10.5* 9.8   LFT Recent Labs    05/15/19 0647  PROT 5.7*  ALBUMIN 1.4*  AST 11*  ALT 14  ALKPHOS 74  BILITOT 0.4   PT/INR No results for input(s): LABPROT, INR in the last 72 hours. Hepatitis Panel No results for input(s): HEPBSAG, HCVAB, HEPAIGM, HEPBIGM in the last 72 hours. C-Diff No results for input(s): CDIFFTOX in the last 72 hours. Fecal Lactopherrin No results for input(s): FECLLACTOFRN in the last 72 hours.  Studies/Results: DG Abd 1 View  Result Date: 05/14/2019 CLINICAL DATA:  Single fluoroscopic image from placement of a feeding tube. EXAM: ABDOMEN - 1 VIEW COMPARISON:  CT, 05/13/2019 FINDINGS: Tube tip projects at the ligament of Treitz. A small amount of contrast is seen in the proximal jejunum just distal to the tube tip. IMPRESSION: Imaging provided  for feeding tube placement. Single spot fluoro graphic image acquired. Feeding tube well positioned. Electronically Signed   By: 05/15/2019 M.D.   On: 05/14/2019 10:45   CT ABDOMEN PELVIS W CONTRAST  Result Date: 05/13/2019 CLINICAL DATA:  Intermittent abdominal pain. EXAM: CT ABDOMEN AND PELVIS WITH CONTRAST TECHNIQUE: Multidetector CT imaging of the abdomen and pelvis was performed using the standard protocol following bolus administration of intravenous contrast. CONTRAST:  05/15/2019 OMNIPAQUE IOHEXOL 300 MG/ML  SOLN COMPARISON:  April 20, 2019 FINDINGS: Lower chest: No acute abnormality. Hepatobiliary: No focal liver abnormality is seen. Diffuse fatty infiltration of the liver parenchyma is noted. Status post cholecystectomy. No biliary dilatation. Pancreas: There is marked severity peripancreatic inflammatory fat stranding. A very large (17.2 cm x 17.4 cm x 16.6 cm) collection of predominantly anterior peripancreatic fluid is seen. This extends from the region of the gastric fundus along the anterior aspect of the mid abdomen. Posteromedial extension is also seen, along the region surrounding the proximal portion of the duodenum. Spleen: Normal in size without focal abnormality. Adrenals/Urinary Tract: Adrenal glands are unremarkable. Kidneys are normal, without renal calculi, focal lesion, or hydronephrosis. Bladder is unremarkable. Stomach/Bowel: Stomach is within normal limits. Appendix appears normal. No evidence of bowel wall thickening, distention, or inflammatory changes. Vascular/Lymphatic: There is mild calcification of the left common iliac artery. No enlarged abdominal or pelvic lymph nodes. Reproductive: Prostate is unremarkable. Other: No abdominal wall hernia or abnormality. Musculoskeletal: No acute or significant osseous findings.  IMPRESSION: 1. Marked severity acute pancreatitis with associated very large peripancreatic fluid collection. 2. Fatty liver. 3. Evidence of prior cholecystectomy.  Electronically Signed   By: Virgina Norfolk M.D.   On: 05/13/2019 20:46    Medications:  Scheduled: . allopurinol  300 mg Oral Daily  . enoxaparin (LOVENOX) injection  40 mg Subcutaneous Q24H  . HYDROmorphone   Intravenous Q4H  . insulin aspart  0-15 Units Subcutaneous Q4H  . insulin aspart  4 Units Subcutaneous Q4H  . insulin glargine  35 Units Subcutaneous Daily  . pantoprazole (PROTONIX) IV  40 mg Intravenous Q12H   Continuous: . feeding supplement (VITAL AF 1.2 CAL) 1,000 mL (05/14/19 1837)  . magnesium sulfate bolus IVPB 2 g (05/15/19 0956)  . potassium PHOSPHATE IVPB (in mmol)      Assessment/Plan: 1) Acute pancreatitis. 2) Severe malnutrition.   Subjectively he looks better today.  He is still very ill, but it appears that he is making small incremental improvements.  No evidence of any fever.  Plan: 1) Continue with tube feeds. 2) Continue with PCA. 3) Monitor for fever. 4) Correct electrolytes as needed.  LOS: 3 days   Can Lucci D 05/15/2019, 10:01 AM

## 2019-05-15 NOTE — Progress Notes (Signed)
Inpatient Diabetes Program Recommendations  AACE/ADA: New Consensus Statement on Inpatient Glycemic Control   Target Ranges:  Prepandial:   less than 140 mg/dL      Peak postprandial:   less than 180 mg/dL (1-2 hours)      Critically ill patients:  140 - 180 mg/dL   Results for KIER, SMEAD (MRN [id number]) as of 05/15/2019 06:57  Ref. Range 05/15/2019 00:55 05/15/2019 01:51 05/15/2019 03:05 05/15/2019 04:03 05/15/2019 05:08 05/15/2019 06:03  Glucose-Capillary Latest Ref Range: 70 - 99 mg/dL 847 (H) 308 (H) 569 (H) 141 (H) 163 (H) 184 (H)  Results for YOSIEL, THIEME (MRN [id number]) as of 05/15/2019 06:57  Ref. Range 04/20/2019 17:08 04/22/2019 02:43  Hemoglobin A1C Latest Ref Range: 4.8 - 5.6 % 13.1 (H) 13.2 (H)    Review of Glycemic Control  Diabetes history: DM2 Outpatient Diabetes medications: Basaglar 20 units daily, Metformin XR 1000 mg BID Current orders for Inpatient glycemic control: IV insulin; Vital @85  ml/hr  Inpatient Diabetes Program Recommendations:   At time of transition from IV to SQ insulin: Once MD is ready to transition patient from IV to SQ insulin, please consider ordering Lantus 35 units Q24H, CBGs Q4H, Novolog 0-15 units Q4H, Novolog 4 units Q4H for tube feeding coverage.  Thanks, , RN, MSN, CDE Diabetes Coordinator Inpatient Diabetes Program [phone] (Team Pager from 8am to 5pm)

## 2019-05-15 NOTE — Progress Notes (Signed)
Occupational Therapy Treatment Patient Details Name: Caleb Chambers MRN: [id number] DOB: April 06, 1966 Today's Date: 05/15/2019    History of present illness Pt is a 54 year old man admitted 05/12/19 with N/V, abdominal pain and DKA. Pt discharged from [facility] on 04/29/19 with DKA and pancreatitis. He has not been able to eat much since his discharge. PMH: morbid obesity, HTN, gout, Crohn's disease, IDDM.    OT comments  Pt more alert today, but still with lethargy and demonstrating slow processing and needed multimodal cues for direction following. Pt fatigued from having been up in chair. Stood and ambulated to bed with min guard assist and line management  Assisted back to bed at end of session.    Follow Up Recommendations  CIR    Equipment Recommendations  3 in 1 bedside commode    Recommendations for Other Services      Precautions / Restrictions Precautions Precautions: Fall       Mobility Bed Mobility Overal bed mobility: Needs Assistance Bed Mobility: Sit to Supine       Sit to supine: Min assist   General bed mobility comments: assisted LEs back into bed  Transfers Overall transfer level: Needs assistance Equipment used: Rolling walker (2 wheeled) Transfers: Sit to/from Stand Sit to Stand: Min guard         General transfer comment: cues for hand placement, min guard for safety and lines    Balance Overall balance assessment: Needs assistance   Sitting balance-Leahy Scale: Fair     Standing balance support: Bilateral upper extremity supported Standing balance-Leahy Scale: Poor Standing balance comment: dependent on RW                           ADL either performed or assessed with clinical judgement   ADL Overall ADL's : Needs assistance/impaired                 Upper Body Dressing : Minimal assistance;Sitting       Toilet Transfer: Min guard;Supervision/safety                   Vision       Perception     Praxis       Cognition Arousal/Alertness: Awake/alert;Lethargic Behavior During Therapy: WFL for tasks assessed/performed Overall Cognitive Status: Impaired/Different from baseline Area of Impairment: Problem solving                             Problem Solving: Slow processing;Decreased initiation;Difficulty sequencing;Requires verbal cues;Requires tactile cues          Exercises     Shoulder Instructions       General Comments      Pertinent Vitals/ Pain       Pain Assessment: 0-10 Pain Score: 7  Pain Location: abdomen Pain Descriptors / Indicators: Constant;Discomfort Pain Intervention(s): Monitored during session;Repositioned  Home Living                                          Prior Functioning/Environment              Frequency  Min 2X/week        Progress Toward Goals  OT Goals(current goals can now be found in the care plan section)  Progress towards OT goals: Progressing toward goals  Acute Rehab OT Goals Patient Stated Goal: feel better OT Goal Formulation: With patient Time For Goal Achievement: 05/27/19 Potential to Achieve Goals: Good  Plan Discharge plan remains appropriate    Co-evaluation                 AM-PAC OT "6 Clicks" Daily Activity     Outcome Measure   Help from another person eating meals?: Total Help from another person taking care of personal grooming?: A Little Help from another person toileting, which includes using toliet, bedpan, or urinal?: Total Help from another person bathing (including washing, rinsing, drying)?: A Lot Help from another person to put on and taking off regular upper body clothing?: A Little Help from another person to put on and taking off regular lower body clothing?: Total 6 Click Score: 11    End of Session    OT Visit Diagnosis: Unsteadiness on feet (R26.81);Pain;Muscle weakness (generalized) (M62.81)   Activity Tolerance Patient limited by fatigue;Patient  limited by lethargy   Patient Left in bed;with call bell/phone within reach   Nurse Communication Mobility status        Time: 5284-1324 OT Time Calculation (min): 14 min  Charges: OT General Charges $OT Visit: 1 Visit OT Treatments $Therapeutic Activity: 8-22 mins  [name], OTR/L Acute Rehabilitation Services Pager: [phone] Office: [phone]   [name] 05/15/2019, 12:45 PM

## 2019-05-15 NOTE — Progress Notes (Signed)
Physical Therapy Treatment Patient Details Name: Caleb Chambers MRN: [id number] DOB: 30-Jun-1965 Today's Date: 05/15/2019    History of Present Illness Pt is a 54 year old man admitted 05/12/19 with N/V, abdominal pain and DKA. Pt discharged from [facility] on 04/29/19 with DKA and pancreatitis. He has not been able to eat much since his discharge. PMH: morbid obesity, HTN, gout, Crohn's disease, IDDM.     PT Comments    Pt limited by muscular fatigue during session, only able to tolerate short household ambulation prior to needing seated rest. Pt is generally weak, utilizing bed rails and elevated HOB during bed mobility as well as needing stabilization of assistive devices to successfully complete transfers. Pt will benefit from continued acute PT POC to reduce falls risk and aide in a return to his prior level of function.   Follow Up Recommendations  CIR;Supervision/Assistance - 24 hour     Equipment Recommendations  Rolling walker with 5" wheels    Recommendations for Other Services       Precautions / Restrictions Precautions Precautions: Fall Restrictions Weight Bearing Restrictions: No    Mobility  Bed Mobility Overal bed mobility: Needs Assistance Bed Mobility: Sit to Supine     Supine to sit: HOB elevated;Supervision Sit to supine: Min assist   General bed mobility comments: assisted LEs back into bed  Transfers Overall transfer level: Needs assistance Equipment used: Rolling walker (2 wheeled) Transfers: Sit to/from Stand Sit to Stand: Min guard         General transfer comment: cues for hand placement, min guard for safety and lines  Ambulation/Gait Ambulation/Gait assistance: Min guard Gait Distance (Feet): 20 Feet Assistive device: Rolling walker (2 wheeled) Gait Pattern/deviations: Step-to pattern Gait velocity: reduced Gait velocity interpretation: <1.8 ft/sec, indicate of risk for recurrent falls General Gait Details: pt with short step to gait,  increased lateral sway although no significant LOB noted   Stairs             Wheelchair Mobility    Modified Rankin (Stroke Patients Only)       Balance Overall balance assessment: Needs assistance Sitting-balance support: No upper extremity supported;Feet supported Sitting balance-Leahy Scale: Fair Sitting balance - Comments: supervision   Standing balance support: Bilateral upper extremity supported Standing balance-Leahy Scale: Poor Standing balance comment: dependent on RW                            Cognition Arousal/Alertness: Awake/alert;Lethargic Behavior During Therapy: WFL for tasks assessed/performed Overall Cognitive Status: Impaired/Different from baseline Area of Impairment: Problem solving;Following commands                       Following Commands: Follows one step commands with increased time(and multimodal cues)     Problem Solving: Slow processing;Decreased initiation;Difficulty sequencing;Requires verbal cues;Requires tactile cues        Exercises General Exercises - Lower Extremity Ankle Circles/Pumps: AROM;Both;10 reps Gluteal Sets: AROM;Both;5 reps Long Arc Quad: AROM;Both;5 reps    General Comments General comments (skin integrity, edema, etc.): pt on RA with stable vitals, NG tube      Pertinent Vitals/Pain Pain Assessment: 0-10 Pain Score: 7  Faces Pain Scale: Hurts little more Pain Location: abdomen Pain Descriptors / Indicators: Constant;Discomfort Pain Intervention(s): Monitored during session;Repositioned    Home Living                      Prior  Function            PT Goals (current goals can now be found in the care plan section) Acute Rehab PT Goals Patient Stated Goal: feel better Progress towards PT goals: Progressing toward goals    Frequency    Min 3X/week      PT Plan Current plan remains appropriate    Co-evaluation              AM-PAC PT "6 Clicks" Mobility    Outcome Measure  Help needed turning from your back to your side while in a flat bed without using bedrails?: A Little Help needed moving from lying on your back to sitting on the side of a flat bed without using bedrails?: A Little Help needed moving to and from a bed to a chair (including a wheelchair)?: A Little Help needed standing up from a chair using your arms (e.g., wheelchair or bedside chair)?: A Little Help needed to walk in hospital room?: A Little Help needed climbing 3-5 steps with a railing? : A Lot 6 Click Score: 17    End of Session Equipment Utilized During Treatment: (none) Activity Tolerance: Patient tolerated treatment well Patient left: in chair;with call bell/phone within reach Nurse Communication: Mobility status PT Visit Diagnosis: Unsteadiness on feet (R26.81);Muscle weakness (generalized) (M62.81);Difficulty in walking, not elsewhere classified (R26.2)     Time: 7897-8478 PT Time Calculation (min) (ACUTE ONLY): 30 min  Charges:  $Gait Training: 8-22 mins $Therapeutic Activity: 8-22 mins                     Zenaida Niece, PT, DPT Acute Rehabilitation Pager: [phone]    Zenaida Niece 05/15/2019, 12:53 PM

## 2019-05-15 NOTE — Progress Notes (Signed)
Md notified.  Pt has PCA Dilaudid infusing but d/c'd in computer.  Called to clarify.  Also pt tachy at 117. Pt alert. SBP stable at 120/63. Will continue to monitor. Caleb Chambers

## 2019-05-16 DIAGNOSIS — R935 Abnormal findings on diagnostic imaging of other abdominal regions, including retroperitoneum: Secondary | ICD-10-CM

## 2019-05-16 DIAGNOSIS — K851 Biliary acute pancreatitis without necrosis or infection: Secondary | ICD-10-CM

## 2019-05-16 LAB — GLUCOSE, CAPILLARY
Glucose-Capillary: 281 mg/dL — ABNORMAL HIGH (ref 70–99)
Glucose-Capillary: 348 mg/dL — ABNORMAL HIGH (ref 70–99)
Glucose-Capillary: 365 mg/dL — ABNORMAL HIGH (ref 70–99)
Glucose-Capillary: 381 mg/dL — ABNORMAL HIGH (ref 70–99)
Glucose-Capillary: 390 mg/dL — ABNORMAL HIGH (ref 70–99)
Glucose-Capillary: 402 mg/dL — ABNORMAL HIGH (ref 70–99)

## 2019-05-16 LAB — CBC
HCT: 29.9 % — ABNORMAL LOW (ref 39.0–52.0)
Hemoglobin: 9.8 g/dL — ABNORMAL LOW (ref 13.0–17.0)
MCH: 24.6 pg — ABNORMAL LOW (ref 26.0–34.0)
MCHC: 32.8 g/dL (ref 30.0–36.0)
MCV: 74.9 fL — ABNORMAL LOW (ref 80.0–100.0)
Platelets: 173 10*3/uL (ref 150–400)
RBC: 3.99 MIL/uL — ABNORMAL LOW (ref 4.22–5.81)
RDW: 15.3 % (ref 11.5–15.5)
WBC: 8 10*3/uL (ref 4.0–10.5)
nRBC: 0.4 % — ABNORMAL HIGH (ref 0.0–0.2)

## 2019-05-16 LAB — COMPREHENSIVE METABOLIC PANEL
ALT: 14 U/L (ref 0–44)
AST: 11 U/L — ABNORMAL LOW (ref 15–41)
Albumin: 1.5 g/dL — ABNORMAL LOW (ref 3.5–5.0)
Alkaline Phosphatase: 83 U/L (ref 38–126)
Anion gap: 8 (ref 5–15)
BUN: 13 mg/dL (ref 6–20)
CO2: 22 mmol/L (ref 22–32)
Calcium: 9.7 mg/dL (ref 8.9–10.3)
Chloride: 102 mmol/L (ref 98–111)
Creatinine, Ser: 0.84 mg/dL (ref 0.61–1.24)
GFR calc Af Amer: >60 mL/min (ref >60)
GFR calc non Af Amer: >60 mL/min (ref >60)
Glucose, Bld: 339 mg/dL — ABNORMAL HIGH (ref 70–99)
Potassium: 3.6 mmol/L (ref 3.5–5.1)
Sodium: 132 mmol/L — ABNORMAL LOW (ref 135–145)
Total Bilirubin: 0.3 mg/dL (ref 0.3–1.2)
Total Protein: 6.1 g/dL — ABNORMAL LOW (ref 6.5–8.1)

## 2019-05-16 LAB — PHOSPHORUS: Phosphorus: 1.3 mg/dL — ABNORMAL LOW (ref 2.5–4.6)

## 2019-05-16 LAB — MAGNESIUM: Magnesium: 1.9 mg/dL (ref 1.7–2.4)

## 2019-05-16 LAB — LIPASE, BLOOD: Lipase: 26 U/L (ref 11–51)

## 2019-05-16 MED ORDER — INSULIN GLARGINE 100 UNIT/ML ~~LOC~~ SOLN
8.0000 [IU] | Freq: Once | SUBCUTANEOUS | Status: AC
  Administered 2019-05-16: 8 [IU] via SUBCUTANEOUS
  Filled 2019-05-16: qty 0.08

## 2019-05-16 MED ORDER — HYDROMORPHONE HCL 1 MG/ML IJ SOLN
1.0000 mg | INTRAMUSCULAR | Status: DC | PRN
  Administered 2019-05-16 – 2019-05-21 (×21): 1 mg via INTRAVENOUS
  Filled 2019-05-16 (×22): qty 1

## 2019-05-16 MED ORDER — INSULIN ASPART 100 UNIT/ML ~~LOC~~ SOLN
6.0000 [IU] | SUBCUTANEOUS | Status: DC
  Administered 2019-05-16 (×2): 6 [IU] via SUBCUTANEOUS

## 2019-05-16 MED ORDER — INSULIN GLARGINE 100 UNIT/ML ~~LOC~~ SOLN
37.0000 [IU] | Freq: Every day | SUBCUTANEOUS | Status: DC
  Administered 2019-05-16: 37 [IU] via SUBCUTANEOUS
  Filled 2019-05-16 (×2): qty 0.37

## 2019-05-16 MED ORDER — POTASSIUM PHOSPHATES 15 MMOLE/5ML IV SOLN
30.0000 mmol | Freq: Once | INTRAVENOUS | Status: AC
  Administered 2019-05-16: 30 mmol via INTRAVENOUS
  Filled 2019-05-16: qty 10

## 2019-05-16 MED ORDER — PHENOL 1.4 % MT LIQD
1.0000 | OROMUCOSAL | Status: DC | PRN
  Administered 2019-05-17: 1 via OROMUCOSAL
  Filled 2019-05-16: qty 177

## 2019-05-16 MED ORDER — INSULIN GLARGINE 100 UNIT/ML ~~LOC~~ SOLN
45.0000 [IU] | Freq: Every day | SUBCUTANEOUS | Status: DC
  Administered 2019-05-17 – 2019-05-25 (×9): 45 [IU] via SUBCUTANEOUS
  Filled 2019-05-16 (×10): qty 0.45

## 2019-05-16 MED ORDER — INSULIN ASPART 100 UNIT/ML ~~LOC~~ SOLN
9.0000 [IU] | SUBCUTANEOUS | Status: DC
  Administered 2019-05-16 – 2019-05-17 (×5): 9 [IU] via SUBCUTANEOUS

## 2019-05-16 MED ORDER — DILTIAZEM HCL 25 MG/5ML IV SOLN
5.0000 mg | Freq: Four times a day (QID) | INTRAVENOUS | Status: DC | PRN
  Administered 2019-05-16 – 2019-05-18 (×3): 5 mg via INTRAVENOUS
  Filled 2019-05-16 (×6): qty 5

## 2019-05-16 MED ORDER — INSULIN ASPART 100 UNIT/ML ~~LOC~~ SOLN
0.0000 [IU] | SUBCUTANEOUS | Status: DC
  Administered 2019-05-16 – 2019-05-17 (×4): 15 [IU] via SUBCUTANEOUS
  Administered 2019-05-17 (×2): 8 [IU] via SUBCUTANEOUS

## 2019-05-16 NOTE — Progress Notes (Signed)
PROGRESS NOTE  Caleb Chambers TKP:[id number] DOB: 1965-10-16 DOA: 05/12/2019 PCP: Patient, No Pcp Per   LOS: 4 days   Brief narrative: As per HPI,  Caleb Chambers is a 54 y.o. male with medical history significant of HTN, DM, gout; and recent admission for pancreatitis and DKA  presented to the hospital with complaints of minimal oral intake since his discharge.  Patient reported shortness of breath and decreased ability to eat and drink.  He has not been fully compliant with insulin regimen as well.  Patient was then admitted to the hospital again for DKA and poor oral intolerance.  Assessment/Plan:  Principal Problem:   DKA (diabetic ketoacidosis) ([place]) Active Problems:   Acute pancreatitis   Acute kidney injury ([place])   Morbid obesity (H[facility])   Debility   Protein-calorie malnutrition, severe  Recurrent DKA secondary to ongoing nausea, vomiting and poor intolerance, noncompliance to insulin regimen.  Hemoglobin A1c was 13.2 on 1/6, signifying poorly controlled diabetes. .  Patient has been transitioned to tube feeding and is on basal bolus  subcu insulin.  Seen by diabetic coordinator.  Hyperglycemia still uncontrolled we will increase Lantus to 37 units and mealtime insulin to 3 units 3 times daily.  Will closely watch for anion gap.  Anion gap of 8 at this time.  Intractable abdominal pain, nausea vomiting with severe pancreatitis.. Was on on Dilaudid pump.   History of cholecystectomy in the past..  Continue IV Protonix.   CT scan of the abdomen and pelvis was performed which showed severe pancreatitis with large pancreatic fluid collection with dimensions of 17.2 x 17.4 x 16.6 cm. Status post IR guided feeding tube placement.  GI on board.  Improving abdominal pain nausea and vomiting..  Will discontinue Dilaudid pump and put on as needed Dilaudid.  No fever or leukocytosis at this time.  Hypokalemia Potassium of 3.6 today.  Adequately replaced, will continue to monitor  closely.  Hypomagnesemia.  Received IV magnesium sulfate.  Magnesium of 1.9   Hypophosphatemia.  replenished potassium phosphate.  Phosphate of 1.3  today.  We will continue to replenish iv  Acute kidney injury on presentation.  Likely secondary to acute pancreatitis and volume depletion.  Resolved.  Creatinine of of 0.8 today  Essential hypertension Patient was on Zestoretic and Norvasc at home.  Will continue to hold for now.    Morbid obesity -BMI 43.4, life style modification to be emphasized on discharge.  Debility and deconditioning. Patient was seen by physical therapy and recommend CIR  Severe protein calorie malnutrition present on admission.  Nutrition on board.  On tube feeding at this time.  VTE Prophylaxis: Lovenox subq  Code Status:  Full code  Family Communication: Spoke with the patient at bedside.  Disposition Plan: CIR, pending clinical improvement.  Patient will likely be in the hospital for several days.  Follow GI recommendations  Consultants:  GI  Interventional radiology  Procedures:  Feeding tube placement 05/14/2019  Antibiotics:  Anti-infectives (From admission, onward)   None     Subjective:  Today, he states that his pain is better controlled but has sore throat.  Denies any nausea or vomiting.  Wants to try some ice chips.   Objective: Vitals:   05/16/19 0730 05/16/19 0739  BP: 121/75   Pulse: (!) 109   Resp: (!) 23 18  Temp:  97.7 F (36.5 C)  SpO2: 97% 100%    Intake/Output Summary (Last 24 hours) at 05/16/2019 5170 Last data filed at 05/16/2019  3419 Gross per 24 hour  Intake 1335.1 ml  Output 600 ml  Net 735.1 ml   Filed Weights   05/14/19 0334 05/15/19 0435 05/16/19 0500  Weight: 133.3 kg 134.1 kg 133.4 kg   Body mass index is 39.89 kg/m.   Physical Exam:  GENERAL: Patient is alert awake communicative, but has weak voice. morbidly obese, feeding tube in place. HENT: No scleral pallor or icterus. Pupils equally  reactive to light. Oral mucosa is moist.  Right submandibular region with tenderness on palpation. NECK: is supple, no palpable thyroid enlargement. CHEST: Clear to auscultation. No crackles or wheezes. Non tender on palpation. Diminished breath sounds bilaterally. CVS: S1 and S2 heard, no murmur. Regular rate and rhythm.  Tachycardic. ABDOMEN: Soft distended abdomen, minimally tender epigastric region. EXTREMITIES: No edema. CNS: Cranial nerves are intact. No focal motor or sensory deficits. SKIN: warm and dry without rashes.   Data Review: I have personally reviewed the following laboratory data and studies,  CBC: Recent Labs  Lab 05/12/19 0950 05/14/19 0525 05/15/19 0647 05/16/19 0222  WBC 6.4 8.3 7.2 8.0  HGB 10.7* 10.7* 9.8* 9.8*  HCT 33.4* 32.9* 29.3* 29.9*  MCV 77.1* 74.4* 73.6* 74.9*  PLT 282 238 178 173   Basic Metabolic Panel: Recent Labs  Lab 05/13/19 1438 05/13/19 1821 05/14/19 0525 05/14/19 0525 05/14/19 1158 05/14/19 2003 05/15/19 0647 05/15/19 1747 05/16/19 0222  NA 134*   < > 134*   < > 133* 133* 133* 131* 132*  K 3.3*   < > 2.8*   < > 3.1* 3.3* 3.3* 4.3 3.6  CL 101   < > 103   < > 103 101 104 102 102  CO2 23   < > 23   < > 21* 21* 22 20* 22  GLUCOSE 176*   < > 155*   < > 207* 211* 199* 382* 339*  BUN 33*   < > 22*   < > 19 18 16 14 13   CREATININE 1.30*   < > 1.03   < > 1.04 0.98 0.74 0.99 0.84  CALCIUM 11.3*   < > 10.8*   < > 10.2 10.5* 9.8 9.9 9.7  MG 1.7  --  1.5*  --   --  2.0 1.9  --  1.9  PHOS 1.6*  --  1.6*  --   --  2.0* 1.3*  --  1.3*   < > = values in this interval not displayed.   Liver Function Tests: Recent Labs  Lab 05/12/19 0953 05/14/19 0525 05/15/19 0647 05/16/19 0222  AST 15 14* 11* 11*  ALT 17 15 14 14   ALKPHOS 81 68 74 83  BILITOT 1.3* 0.4 0.4 0.3  PROT 6.9 6.0* 5.7* 6.1*  ALBUMIN 1.9* 1.5* 1.4* 1.5*   Recent Labs  Lab 05/12/19 0953 05/16/19 0222  LIPASE 49 26   No results for input(s): AMMONIA in the last 168  hours. Cardiac Enzymes: No results for input(s): CKTOTAL, CKMB, CKMBINDEX, TROPONINI in the last 168 hours. BNP (last 3 results) Recent Labs    04/23/19 0450  BNP 99.2    ProBNP (last 3 results) No results for input(s): PROBNP in the last 8760 hours.  CBG: Recent Labs  Lab 05/15/19 1636 05/15/19 2013 05/15/19 2340 05/16/19 0324 05/16/19 0800  GLUCAP 334* 363* 348* 348* 281*   Recent Results (from the past 240 hour(s))  SARS CORONAVIRUS 2 (TAT 6-24 HRS) Nasopharyngeal Nasopharyngeal Swab     Status: None  Collection Time: 05/12/19  2:36 PM   Specimen: Nasopharyngeal Swab  Result Value Ref Range Status   SARS Coronavirus 2 NEGATIVE NEGATIVE Final    Comment: (NOTE) SARS-CoV-2 target nucleic acids are NOT DETECTED. The SARS-CoV-2 RNA is generally detectable in upper and lower respiratory specimens during the acute phase of infection. Negative results do not preclude SARS-CoV-2 infection, do not rule out co-infections with other pathogens, and should not be used as the sole basis for treatment or other patient management decisions. Negative results must be combined with clinical observations, patient history, and epidemiological information. The expected result is Negative. Fact Sheet for Patients: HairSlick.no Fact Sheet for Healthcare Providers: quierodirigir.com This test is not yet approved or cleared by the [place] FDA and  has been authorized for detection and/or diagnosis of SARS-CoV-2 by FDA under an Emergency Use Authorization (EUA). This EUA will remain  in effect (meaning this test can be used) for the duration of the COVID-19 declaration under Section 56 4(b)(1) of the Act, 21 U.S.C. section 360bbb-3(b)(1), unless the authorization is terminated or revoked sooner. Performed at [facility], [address], [place], [place] [address]   MRSA PCR Screening     Status: None   Collection Time:  05/13/19  7:29 PM   Specimen: Nasal Mucosa; Nasopharyngeal  Result Value Ref Range Status   MRSA by PCR NEGATIVE NEGATIVE Final    Comment:        The GeneXpert MRSA Assay (FDA approved for NASAL specimens only), is one component of a comprehensive MRSA colonization surveillance program. It is not intended to diagnose MRSA infection nor to guide or monitor treatment for MRSA infections. Performed at [facility], [address], [place], [place] [address]      Studies: DG Abd 1 View  Result Date: 05/14/2019 CLINICAL DATA:  Single fluoroscopic image from placement of a feeding tube. EXAM: ABDOMEN - 1 VIEW COMPARISON:  CT, 05/13/2019 FINDINGS: Tube tip projects at the ligament of Treitz. A small amount of contrast is seen in the proximal jejunum just distal to the tube tip. IMPRESSION: Imaging provided for feeding tube placement. Single spot fluoro graphic image acquired. Feeding tube well positioned. Electronically Signed   By: Amie Portland M.D.   On: 05/14/2019 10:45    Scheduled Meds: . allopurinol  300 mg Oral Daily  . enoxaparin (LOVENOX) injection  40 mg Subcutaneous Q24H  . HYDROmorphone   Intravenous Q4H  . insulin aspart  0-15 Units Subcutaneous Q4H  . insulin aspart  6 Units Subcutaneous Q4H  . insulin glargine  37 Units Subcutaneous Daily  . pantoprazole (PROTONIX) IV  40 mg Intravenous Q12H    Continuous Infusions: . feeding supplement (VITAL AF 1.2 CAL) 1,000 mL (05/16/19 0810)     Joycelyn Das, MD  [facility] 05/16/2019

## 2019-05-16 NOTE — Progress Notes (Signed)
Cardizem 5 mg given once so far for 138 HR , HR is maintained @110  this time.pt is resting comfortably in bed this time after spending some hours in a recliner. Updated pt's mother over phone. Will continue to monitor the patient  , RN

## 2019-05-16 NOTE — Progress Notes (Signed)
Pt's is alert but sometimes he is forgetful. He asked RN where he is, but when he is fully wake up he is alert times 4. PCA came off, pt does not have any complain of pain. NG feeding contd. Paged MD regarding ST, HR goes up to 138, will follow the order.Pt is sitting in a recliner this time, he prefers to sit rather in bed, will continue to monitor.  Lonia Farber, RN

## 2019-05-16 NOTE — Progress Notes (Signed)
    Progress Note Weekend coverage for Dr. Elnoria Howard   Subjective  Chief Complaint: Pancreatitis  Today, patient tells me he does not feel any better as far as his abdominal pain from when he came into the hospital.  He also tells me that he has "doo dood on myself" and is asking for nursing.  Denies any new complaints or concerns.   Objective   Vital signs in last 24 hours: Temp:  [97.6 F (36.4 C)-98.9 F (37.2 C)] 97.6 F (36.4 C) (01/30 1130) Pulse Rate:  [94-120] 116 (01/30 1130) Resp:  [18-30] 20 (01/30 1130) BP: (109-122)/(63-75) 116/67 (01/30 1130) SpO2:  [95 %-100 %] 99 % (01/30 1130) Weight:  [133.4 kg] 133.4 kg (01/30 0500) Last BM Date: 05/15/19 General:    AA male in NAD Heart:  Regular rate and rhythm; no murmurs Lungs: Respirations even and unlabored, lungs CTA bilaterally Abdomen:  Soft, nontender and nondistended. Normal bowel sounds. Extremities:  Without edema. Neurologic:  Alert and oriented,  grossly normal neurologically. Psych:  Cooperative. Normal mood and affect.  Intake/Output from previous day: 01/29 0701 - 01/30 0700 In: 1469.8 [I.V.:134.7; NG/GT:835; IV Piggyback:500.1] Out: 600 [Urine:600] Intake/Output this shift: Total I/O In: 0  Out: 300 [Urine:300]  Lab Results: Recent Labs    05/14/19 0525 05/15/19 0647 05/16/19 0222  WBC 8.3 7.2 8.0  HGB 10.7* 9.8* 9.8*  HCT 32.9* 29.3* 29.9*  PLT 238 178 173   BMET Recent Labs    05/15/19 0647 05/15/19 1747 05/16/19 0222  NA 133* 131* 132*  K 3.3* 4.3 3.6  CL 104 102 102  CO2 22 20* 22  GLUCOSE 199* 382* 339*  BUN 16 14 13   CREATININE 0.74 0.99 0.84  CALCIUM 9.8 9.9 9.7   LFT Recent Labs    05/16/19 0222  PROT 6.1*  ALBUMIN 1.5*  AST 11*  ALT 14  ALKPHOS 83  BILITOT 0.3    Assessment / Plan:   Assessment: 1.  Acute pancreatitis: Per patient not improving, though per previous notes from Dr. 05/18/19 it has been proving incrementally 2.  Severe malnutrition  Plan: 1.  Continue  tube feeds 2.  Continue PCA 3.  Continue to monitor for fever 4.  Collect electrolytes as needed 5.  Please await any further recommendations from Dr. Elnoria Howard later today.  We will continue to follow over the weekend.   LOS: 4 days   Orvan Falconer  05/16/2019, 11:56 AM

## 2019-05-16 NOTE — Progress Notes (Addendum)
Inpatient Diabetes Program Recommendations  AACE/ADA: New Consensus Statement on Inpatient Glycemic Control (2015)  Target Ranges:  Prepandial:   less than 140 mg/dL      Peak postprandial:   less than 180 mg/dL (1-2 hours)      Critically ill patients:  140 - 180 mg/dL   Results for AMANI, NODARSE (MRN [id number]) as of 05/16/2019 11:33  Ref. Range 05/15/2019 09:16 05/15/2019 11:36 05/15/2019 12:49 05/15/2019 16:36 05/15/2019 20:13 05/15/2019 23:40 05/16/2019 03:24 05/16/2019 08:00 05/16/2019 11:19  Glucose-Capillary Latest Ref Range: 70 - 99 mg/dL 034 (H) 961 (H) 164 (H) 334 (H) 363 (H) 348 (H) 348 (H) 281 (H) 365 (H)   Review of Glycemic Control  Diabetes history: DM2 Outpatient Diabetes medications: Basalgar 20 units daily, Metformin XR 1000 mg BID Current orders for Inpatient glycemic control: Lantus 37 units daily, Novolog 0-15 units Q4H, Novolog 6 units Q4H; Vital @ 85 ml/hr  Inpatient Diabetes Program Recommendations:    Insulin-Basal: Please consider increasing Lantus to 45 units daily (to start 05/17/19). Patient received Lantus 37 units today; so please order one time Lantus 8 units x1 now (for total of 45 units today).  Insulin-Tube Feeding Coverage: Please consider increasing tube feeding to Novolog 9 units Q4H.  Thanks, Orlando Penner, RN, MSN, CDE Diabetes Coordinator Inpatient Diabetes Program [phone] (Team Pager from 8am to 5pm)

## 2019-05-17 DIAGNOSIS — E876 Hypokalemia: Secondary | ICD-10-CM

## 2019-05-17 DIAGNOSIS — E1110 Type 2 diabetes mellitus with ketoacidosis without coma: Secondary | ICD-10-CM

## 2019-05-17 LAB — GLUCOSE, CAPILLARY
Glucose-Capillary: 199 mg/dL — ABNORMAL HIGH (ref 70–99)
Glucose-Capillary: 252 mg/dL — ABNORMAL HIGH (ref 70–99)
Glucose-Capillary: 259 mg/dL — ABNORMAL HIGH (ref 70–99)
Glucose-Capillary: 283 mg/dL — ABNORMAL HIGH (ref 70–99)
Glucose-Capillary: 288 mg/dL — ABNORMAL HIGH (ref 70–99)
Glucose-Capillary: 300 mg/dL — ABNORMAL HIGH (ref 70–99)
Glucose-Capillary: 326 mg/dL — ABNORMAL HIGH (ref 70–99)

## 2019-05-17 LAB — CBC
HCT: 30.9 % — ABNORMAL LOW (ref 39.0–52.0)
Hemoglobin: 10.1 g/dL — ABNORMAL LOW (ref 13.0–17.0)
MCH: 24.5 pg — ABNORMAL LOW (ref 26.0–34.0)
MCHC: 32.7 g/dL (ref 30.0–36.0)
MCV: 75 fL — ABNORMAL LOW (ref 80.0–100.0)
Platelets: 172 10*3/uL (ref 150–400)
RBC: 4.12 MIL/uL — ABNORMAL LOW (ref 4.22–5.81)
RDW: 15.6 % — ABNORMAL HIGH (ref 11.5–15.5)
WBC: 8.9 10*3/uL (ref 4.0–10.5)
nRBC: 0.6 % — ABNORMAL HIGH (ref 0.0–0.2)

## 2019-05-17 LAB — COMPREHENSIVE METABOLIC PANEL
ALT: 12 U/L (ref 0–44)
AST: 14 U/L — ABNORMAL LOW (ref 15–41)
Albumin: 1.6 g/dL — ABNORMAL LOW (ref 3.5–5.0)
Alkaline Phosphatase: 90 U/L (ref 38–126)
Anion gap: 10 (ref 5–15)
BUN: 15 mg/dL (ref 6–20)
CO2: 24 mmol/L (ref 22–32)
Calcium: 9.8 mg/dL (ref 8.9–10.3)
Chloride: 104 mmol/L (ref 98–111)
Creatinine, Ser: 0.83 mg/dL (ref 0.61–1.24)
GFR calc Af Amer: >60 mL/min (ref >60)
GFR calc non Af Amer: >60 mL/min (ref >60)
Glucose, Bld: 332 mg/dL — ABNORMAL HIGH (ref 70–99)
Potassium: 3.8 mmol/L (ref 3.5–5.1)
Sodium: 138 mmol/L (ref 135–145)
Total Bilirubin: 0.5 mg/dL (ref 0.3–1.2)
Total Protein: 6.6 g/dL (ref 6.5–8.1)

## 2019-05-17 LAB — PHOSPHORUS: Phosphorus: 1.6 mg/dL — ABNORMAL LOW (ref 2.5–4.6)

## 2019-05-17 LAB — MAGNESIUM: Magnesium: 1.6 mg/dL — ABNORMAL LOW (ref 1.7–2.4)

## 2019-05-17 MED ORDER — INSULIN ASPART 100 UNIT/ML ~~LOC~~ SOLN
12.0000 [IU] | SUBCUTANEOUS | Status: DC
  Administered 2019-05-17 – 2019-05-21 (×19): 12 [IU] via SUBCUTANEOUS

## 2019-05-17 MED ORDER — POTASSIUM PHOSPHATES 15 MMOLE/5ML IV SOLN
30.0000 mmol | Freq: Once | INTRAVENOUS | Status: AC
  Administered 2019-05-17: 30 mmol via INTRAVENOUS
  Filled 2019-05-17 (×2): qty 10

## 2019-05-17 MED ORDER — INSULIN GLARGINE 100 UNIT/ML ~~LOC~~ SOLN
20.0000 [IU] | Freq: Every day | SUBCUTANEOUS | Status: DC
  Administered 2019-05-17 – 2019-05-25 (×9): 20 [IU] via SUBCUTANEOUS
  Filled 2019-05-17 (×10): qty 0.2

## 2019-05-17 MED ORDER — INSULIN ASPART 100 UNIT/ML ~~LOC~~ SOLN
0.0000 [IU] | SUBCUTANEOUS | Status: DC
  Administered 2019-05-17: 4 [IU] via SUBCUTANEOUS
  Administered 2019-05-17 (×2): 11 [IU] via SUBCUTANEOUS
  Administered 2019-05-17: 15 [IU] via SUBCUTANEOUS
  Administered 2019-05-18: 09:00:00 11 [IU] via SUBCUTANEOUS
  Administered 2019-05-18: 7 [IU] via SUBCUTANEOUS
  Administered 2019-05-18: 15 [IU] via SUBCUTANEOUS
  Administered 2019-05-18: 17:00:00 7 [IU] via SUBCUTANEOUS
  Administered 2019-05-19: 3 [IU] via SUBCUTANEOUS
  Administered 2019-05-19: 12:00:00 7 [IU] via SUBCUTANEOUS
  Administered 2019-05-19: 3 [IU] via SUBCUTANEOUS
  Administered 2019-05-19: 7 [IU] via SUBCUTANEOUS
  Administered 2019-05-19: 01:00:00 4 [IU] via SUBCUTANEOUS
  Administered 2019-05-19: 11 [IU] via SUBCUTANEOUS
  Administered 2019-05-20: 3 [IU] via SUBCUTANEOUS
  Administered 2019-05-20 (×2): 4 [IU] via SUBCUTANEOUS
  Administered 2019-05-20: 7 [IU] via SUBCUTANEOUS
  Administered 2019-05-21: 3 [IU] via SUBCUTANEOUS

## 2019-05-17 MED ORDER — BISACODYL 10 MG RE SUPP: 10.0000 mg | Freq: Once | RECTAL | Status: DC

## 2019-05-17 MED ORDER — MAGNESIUM SULFATE 2 GM/50ML IV SOLN
2.0000 g | Freq: Once | INTRAVENOUS | Status: AC
  Administered 2019-05-17: 2 g via INTRAVENOUS
  Filled 2019-05-17: qty 50

## 2019-05-17 MED ORDER — POTASSIUM PHOSPHATES 15 MMOLE/5ML IV SOLN
30.0000 mmol | Freq: Once | INTRAVENOUS | Status: AC
  Administered 2019-05-17: 30 mmol via INTRAVENOUS
  Filled 2019-05-17: qty 10

## 2019-05-17 MED ORDER — INSULIN ASPART 100 UNIT/ML ~~LOC~~ SOLN: 0.0000 [IU] | SUBCUTANEOUS | Status: DC

## 2019-05-17 MED ORDER — INSULIN ASPART 100 UNIT/ML ~~LOC~~ SOLN
8.0000 [IU] | Freq: Once | SUBCUTANEOUS | Status: AC
  Administered 2019-05-17: 8 [IU] via SUBCUTANEOUS

## 2019-05-17 NOTE — Progress Notes (Signed)
Md notified of pt's cbg 402.  New orders received.  Will continue to monitor. Karena Addison T

## 2019-05-17 NOTE — Progress Notes (Signed)
Pt sat in a recliner for a while this morning, dilaudid is given once so far for pain. Feeding is continue @85cc /hr. Pt. Is one person assist to the [facility], pretty much steady on his feet, alert and oriented times 4, will continue to monitor the patient  [facility], RN

## 2019-05-17 NOTE — Progress Notes (Signed)
    Progress Note Weekend Coverage for Dr. Elnoria Howard  Subjective  Chief Complaint: Pancreatitis  Today, patient tells me he is feeling better, his abdominal pain is some better and he has been able to get some sleep.  Denies any new complaints or concerns.   Objective   Vital signs in last 24 hours: Temp:  [97.6 F (36.4 C)-98.1 F (36.7 C)] 97.7 F (36.5 C) (01/31 0740) Pulse Rate:  [106-122] 106 (01/31 0740) Resp:  [18-32] 20 (01/31 0740) BP: (116-138)/(67-83) 130/83 (01/31 0740) SpO2:  [94 %-99 %] 95 % (01/31 0740) Weight:  [132.8 kg] 132.8 kg (01/31 0354) Last BM Date: 05/17/19 General:   obese AA male in NAD Heart:  Regular rate and rhythm; no murmurs Lungs: Respirations even and unlabored, lungs CTA bilaterally Abdomen:  Soft, marked ttp over epigastrum and nondistended. Normal bowel sounds. Extremities:  Without edema. Neurologic:  Alert and oriented,  grossly normal neurologically. Psych:  Cooperative. Normal mood and affect.  Intake/Output from previous day: 01/30 0701 - 01/31 0700 In: 2285.2 [NG/GT:1838.8; IV Piggyback:446.4] Out: 1450 [Urine:1450] Intake/Output this shift: Total I/O In: 0  Out: 200 [Urine:200]  Lab Results: Recent Labs    05/15/19 0647 05/16/19 0222 05/17/19 0242  WBC 7.2 8.0 8.9  HGB 9.8* 9.8* 10.1*  HCT 29.3* 29.9* 30.9*  PLT 178 173 172   BMET Recent Labs    05/15/19 1747 05/16/19 0222 05/17/19 0242  NA 131* 132* 138  K 4.3 3.6 3.8  CL 102 102 104  CO2 20* 22 24  GLUCOSE 382* 339* 332*  BUN 14 13 15   CREATININE 0.99 0.84 0.83  CALCIUM 9.9 9.7 9.8   LFT Recent Labs    05/17/19 0242  PROT 6.6  ALBUMIN 1.6*  AST 14*  ALT 12  ALKPHOS 90  BILITOT 0.5     Assessment / Plan:   Assessment: 1.  Acute pancreatitis: Large 17.2 x 17.4 x 16.6 cm pancreatic fluid collection seen on CT 05/13/2019, no evidence of pancreatic necrosis, liver enzymes normal, does report that he is improving today 2.  Severe malnutrition  Plan: 1.   Continue tube feeds 2.  Continue PCA 3.  Continue to monitor for fever 4.  Continue IV fluid, antiemetics, IV pantoprazole 5.  Collect electrolytes as needed 6.  Please await any further recommendations from Dr. 05/15/2019 later today.  Dr. Orvan Falconer will take back over tomorrow.   LOS: 5 days   Elnoria Howard  05/17/2019, 10:03 AM

## 2019-05-17 NOTE — Progress Notes (Addendum)
PROGRESS NOTE  KAYLEE WOMBLES NWG:[id number] DOB: [birth year] DOA: 05/12/2019 PCP: Patient, No Pcp Per   LOS: 5 days   Brief narrative: As per HPI,  DERYL GIROUX is a 54 y.o. male with medical history significant of HTN, DM, gout; and recent admission for pancreatitis and DKA  presented to the hospital with complaints of minimal oral intake since his discharge.  Patient reported shortness of breath and decreased ability to eat and drink.  He has not been fully compliant with insulin regimen as well.  Patient was then admitted to the hospital again for DKA and poor oral intolerance.  Assessment/Plan:  Principal Problem:   DKA (diabetic ketoacidosis) (HCC) Active Problems:   Acute pancreatitis   Acute kidney injury (HCC)   Morbid obesity (HCC)   Debility   Protein-calorie malnutrition, severe  Recurrent DKA  Hemoglobin A1c was 13.2 on 1/6, signifying poorly controlled diabetes.  on tube feeding and is on basal bolus subcu insulin. Diabetic coordinator on board.  Hyperglycemia still uncontrolled and is on Lantus to 45 units and mealtime insulin to 9 units q4h.    Anion gap of 10 at this time.  Will increase regimen today.  Add Lantus 20 units at nighttime and increase every 4 hours insulin to 12 units q 4hr.  Increase to resistant sliding scale.  We will close monitor.  Intractable abdominal pain, nausea vomiting with severe pancreatitis.. Was on on Dilaudid pump.   Improving.  As needed Dilaudid.  Patient states that he has intermittent abdominal pain.  History of cholecystectomy in the past..  Continue IV Protonix.   CT scan of the abdomen and pelvis was performed which showed severe pancreatitis with large pancreatic fluid collection with dimensions of 17.2 x 17.4 x 16.6 cm. Status post IR guided feeding tube placement. No fever or leukocytosis at this time.  Lipase of 26 on 05/16/2019.  Tachycardia, history of hypertension.  Started IV Cardizem since yesterday with mild  improvement.  Hypokalemia Potassium of 3.8 today. Monitor and replenish necessary.  Hypomagnesemia.  Magnesium of 1.6 today.  Continue IV mag sulphate.  Check levels in a.m.  Hypophosphatemia. continue to replenish with iv potassium phosphate. phosphate of 1.6.  Check levels in am.  Acute kidney injury on presentation.  Likely secondary to acute pancreatitis and volume depletion.  Resolved.  Creatinine of of 0.8 today  Essential hypertension Patient was on Zestoretic and Norvasc at home.  Will continue to hold for now.    Morbid obesity -BMI 43.4, now NPO.  Debility and deconditioning. PT recommends CIR  Severe protein calorie malnutrition present on admission.  Nutrition on board.  On tube feeding at this time.  VTE Prophylaxis: Lovenox subq  Code Status:  Full code  Family Communication: Spoke with the patient at bedside.  I also spoke with the patient's brother on the phone and updated him about the clinical condition of the patient.   Disposition Plan: CIR, pending clinical improvement.  Patient will likely be in the hospital for several days.  Follow GI recommendations  Consultants:  GI  Interventional radiology  Procedures:  Feeding tube placement 05/14/2019  Antibiotics:  Anti-infectives (From admission, onward)   None     Subjective:  Today, patient states that he has intermittent abdominal pain but currently feels okay.  No vomiting.  Has been tolerating tube feeding.  Has not had a bowel movement in several days.  Objective: Vitals:   05/16/19 2318 05/17/19 0341  BP: 138/79 128/81  Pulse: [name])  111 (!) 118  Resp: (!) 26 (!) 21  Temp: 97.8 F (36.6 C) 97.8 F (36.6 C)  SpO2: 99% 97%    Intake/Output Summary (Last 24 hours) at 05/17/2019 0721 Last data filed at 05/17/2019 0600 Gross per 24 hour  Intake 2285.24 ml  Output 1450 ml  Net 835.24 ml   Filed Weights   05/15/19 0435 05/16/19 0500 05/17/19 0354  Weight: 134.1 kg 133.4 kg 132.8 kg    Body mass index is 39.71 kg/m.   Physical Exam:  General: Obese, not in obvious distress, weak voice.  Feeding tube in place HENT: Normocephalic, pupils equally reacting to light and accommodation.  No scleral pallor or icterus noted. Oral mucosa is moist.  Right submandibular region with tenderness, mild throat erythema. Chest:  Clear breath sounds.  Diminished breath sounds bilaterally. No crackles or wheezes.  CVS: S1 &S2 heard. No murmur.  Regular rate and rhythm.  Mildly tachycardic Abdomen: Soft, mildly distended and nonspecific tenderness on palpation over the epigastric and upper abdominal region..  Bowel sounds are heard. Extremities: No cyanosis, clubbing or edema.  Peripheral pulses are palpable. Psych: Alert, awake and oriented, weak voice CNS:  No cranial nerve deficits.  Mild weakness- generalized noted Skin: Warm and dry.  No rashes noted.   Data Review: I have personally reviewed the following laboratory data and studies,  CBC: Recent Labs  Lab 05/12/19 0950 05/14/19 0525 05/15/19 0647 05/16/19 0222 05/17/19 0242  WBC 6.4 8.3 7.2 8.0 8.9  HGB 10.7* 10.7* 9.8* 9.8* 10.1*  HCT 33.4* 32.9* 29.3* 29.9* 30.9*  MCV 77.1* 74.4* 73.6* 74.9* 75.0*  PLT 282 238 178 173 431   Basic Metabolic Panel: Recent Labs  Lab 05/14/19 0525 05/14/19 1158 05/14/19 2003 05/15/19 0647 05/15/19 1747 05/16/19 0222 05/17/19 0242  NA 134*   < > 133* 133* 131* 132* 138  K 2.8*   < > 3.3* 3.3* 4.3 3.6 3.8  CL 103   < > 101 104 102 102 104  CO2 23   < > 21* 22 20* 22 24  GLUCOSE 155*   < > 211* 199* 382* 339* 332*  BUN 22*   < > 18 16 14 13 15   CREATININE 1.03   < > 0.98 0.74 0.99 0.84 0.83  CALCIUM 10.8*   < > 10.5* 9.8 9.9 9.7 9.8  MG 1.5*  --  2.0 1.9  --  1.9 1.6*  PHOS 1.6*  --  2.0* 1.3*  --  1.3* 1.6*   < > = values in this interval not displayed.   Liver Function Tests: Recent Labs  Lab 05/12/19 0953 05/14/19 0525 05/15/19 0647 05/16/19 0222 05/17/19 0242  AST  15 14* 11* 11* 14*  ALT 17 15 14 14 12   ALKPHOS 81 68 74 83 90  BILITOT 1.3* 0.4 0.4 0.3 0.5  PROT 6.9 6.0* 5.7* 6.1* 6.6  ALBUMIN 1.9* 1.5* 1.4* 1.5* 1.6*   Recent Labs  Lab 05/12/19 0953 05/16/19 0222  LIPASE 49 26   No results for input(s): AMMONIA in the last 168 hours. Cardiac Enzymes: No results for input(s): CKTOTAL, CKMB, CKMBINDEX, TROPONINI in the last 168 hours. BNP (last 3 results) Recent Labs    04/23/19 0450  BNP 99.2    ProBNP (last 3 results) No results for input(s): PROBNP in the last 8760 hours.  CBG: Recent Labs  Lab 05/16/19 1119 05/16/19 1606 05/16/19 1953 05/16/19 2346 05/17/19 0347  GLUCAP 365* 390* 381* 402* 288*   Recent  Results (from the past 240 hour(s))  SARS CORONAVIRUS 2 (TAT 6-24 HRS) Nasopharyngeal Nasopharyngeal Swab     Status: None   Collection Time: 05/12/19  2:36 PM   Specimen: Nasopharyngeal Swab  Result Value Ref Range Status   SARS Coronavirus 2 NEGATIVE NEGATIVE Final    Comment: (NOTE) SARS-CoV-2 target nucleic acids are NOT DETECTED. The SARS-CoV-2 RNA is generally detectable in upper and lower respiratory specimens during the acute phase of infection. Negative results do not preclude SARS-CoV-2 infection, do not rule out co-infections with other pathogens, and should not be used as the sole basis for treatment or other patient management decisions. Negative results must be combined with clinical observations, patient history, and epidemiological information. The expected result is Negative. Fact Sheet for Patients: HairSlick.no Fact Sheet for Healthcare Providers: quierodirigir.com This test is not yet approved or cleared by the [place] FDA and  has been authorized for detection and/or diagnosis of SARS-CoV-2 by FDA under an Emergency Use Authorization (EUA). This EUA will remain  in effect (meaning this test can be used) for the duration of the COVID-19  declaration under Section 56 4(b)(1) of the Act, 21 U.S.C. section 360bbb-3(b)(1), unless the authorization is terminated or revoked sooner. Performed at [facility], [address], [place], [place] [address]   MRSA PCR Screening     Status: None   Collection Time: 05/13/19  7:29 PM   Specimen: Nasal Mucosa; Nasopharyngeal  Result Value Ref Range Status   MRSA by PCR NEGATIVE NEGATIVE Final    Comment:        The GeneXpert MRSA Assay (FDA approved for NASAL specimens only), is one component of a comprehensive MRSA colonization surveillance program. It is not intended to diagnose MRSA infection nor to guide or monitor treatment for MRSA infections. Performed at [facility], [address], [place], [place] [address]      Studies: No results found.  Scheduled Meds: . allopurinol  300 mg Oral Daily  . enoxaparin (LOVENOX) injection  40 mg Subcutaneous Q24H  . insulin aspart  0-15 Units Subcutaneous Q4H  . insulin aspart  9 Units Subcutaneous Q4H  . insulin glargine  45 Units Subcutaneous Daily  . pantoprazole (PROTONIX) IV  40 mg Intravenous Q12H    Continuous Infusions: . feeding supplement (VITAL AF 1.2 CAL) 1,000 mL (05/17/19 0450)  . magnesium sulfate bolus IVPB    . potassium PHOSPHATE IVPB (in mmol)       Joycelyn Das, MD  [facility] 05/17/2019

## 2019-05-17 NOTE — Plan of Care (Signed)

## 2019-05-17 NOTE — Progress Notes (Signed)
Inpatient Diabetes Program Recommendations  AACE/ADA: New Consensus Statement on Inpatient Glycemic Control  Target Ranges:  Prepandial:   less than 140 mg/dL      Peak postprandial:   less than 180 mg/dL (1-2 hours)      Critically ill patients:  140 - 180 mg/dL   Results for Caleb Chambers, Caleb Chambers (MRN [id number]) as of 05/17/2019 10:04  Ref. Range 05/16/2019 08:00 05/16/2019 11:19 05/16/2019 16:06 05/16/2019 19:53 05/16/2019 23:46 05/17/2019 03:47 05/17/2019 07:56 05/17/2019 08:16  Glucose-Capillary Latest Ref Range: 70 - 99 mg/dL 972 (H) 820 (H) 601 (H) 381 (H) 402 (H) 288 (H) 252 (H) 300 (H)   Review of Glycemic Control  Diabetes history: DM2 Outpatient Diabetes medications: Basalgar 20 units daily, Metformin XR 1000 mg BID Current orders for Inpatient glycemic control: Lantus 45 units daily, Novolog 0-15 units Q4H, Novolog 9 units Q4H; Vital @ 85 ml/hr  Inpatient Diabetes Program Recommendations:    Insulin-Basal: Please consider ordering Lantus 20 units QHS (in addition to continuing Lantus to 45 units daily).   Insulin-Tube Feeding Coverage: Please consider increasing tube feeding to Novolog 12 units Q4H.  Insulin-Correction: Please consider increasing Novolog correction to Resistant scale 0-20 units Q4H.  Thanks, Caleb Penner, RN, MSN, CDE Diabetes Coordinator Inpatient Diabetes Program [phone] (Team Pager from 8am to 5pm)

## 2019-05-18 LAB — BLOOD GAS, ARTERIAL
Acid-Base Excess: 2.7 mmol/L — ABNORMAL HIGH (ref 0.0–2.0)
Bicarbonate: 26.1 mmol/L (ref 20.0–28.0)
Drawn by: 51155
FIO2: 21
O2 Saturation: 92.9 %
Patient temperature: 37
pCO2 arterial: 35.7 mmHg (ref 32.0–48.0)
pH, Arterial: 7.477 — ABNORMAL HIGH (ref 7.350–7.450)
pO2, Arterial: 62.4 mmHg — ABNORMAL LOW (ref 83.0–108.0)

## 2019-05-18 LAB — COMPREHENSIVE METABOLIC PANEL
ALT: 12 U/L (ref 0–44)
AST: 15 U/L (ref 15–41)
Albumin: 1.5 g/dL — ABNORMAL LOW (ref 3.5–5.0)
Alkaline Phosphatase: 88 U/L (ref 38–126)
Anion gap: 10 (ref 5–15)
BUN: 14 mg/dL (ref 6–20)
CO2: 25 mmol/L (ref 22–32)
Calcium: 9.3 mg/dL (ref 8.9–10.3)
Chloride: 105 mmol/L (ref 98–111)
Creatinine, Ser: 0.77 mg/dL (ref 0.61–1.24)
GFR calc Af Amer: >60 mL/min (ref >60)
GFR calc non Af Amer: >60 mL/min (ref >60)
Glucose, Bld: 196 mg/dL — ABNORMAL HIGH (ref 70–99)
Potassium: 4.1 mmol/L (ref 3.5–5.1)
Sodium: 140 mmol/L (ref 135–145)
Total Bilirubin: 0.2 mg/dL — ABNORMAL LOW (ref 0.3–1.2)
Total Protein: 6.7 g/dL (ref 6.5–8.1)

## 2019-05-18 LAB — CBC
HCT: 30.5 % — ABNORMAL LOW (ref 39.0–52.0)
Hemoglobin: 9.9 g/dL — ABNORMAL LOW (ref 13.0–17.0)
MCH: 24.4 pg — ABNORMAL LOW (ref 26.0–34.0)
MCHC: 32.5 g/dL (ref 30.0–36.0)
MCV: 75.3 fL — ABNORMAL LOW (ref 80.0–100.0)
Platelets: 173 10*3/uL (ref 150–400)
RBC: 4.05 MIL/uL — ABNORMAL LOW (ref 4.22–5.81)
RDW: 15.7 % — ABNORMAL HIGH (ref 11.5–15.5)
WBC: 9.5 10*3/uL (ref 4.0–10.5)
nRBC: 0.7 % — ABNORMAL HIGH (ref 0.0–0.2)

## 2019-05-18 LAB — LACTIC ACID, PLASMA
Lactic Acid, Venous: 1.6 mmol/L (ref 0.5–1.9)
Lactic Acid, Venous: 2.1 mmol/L (ref 0.5–1.9)

## 2019-05-18 LAB — GLUCOSE, CAPILLARY
Glucose-Capillary: 187 mg/dL — ABNORMAL HIGH (ref 70–99)
Glucose-Capillary: 280 mg/dL — ABNORMAL HIGH (ref 70–99)
Glucose-Capillary: 338 mg/dL — ABNORMAL HIGH (ref 70–99)
Glucose-Capillary: 237 mg/dL — ABNORMAL HIGH (ref 70–99)
Glucose-Capillary: 217 mg/dL — ABNORMAL HIGH (ref 70–99)

## 2019-05-18 LAB — PHOSPHORUS: Phosphorus: 2.9 mg/dL (ref 2.5–4.6)

## 2019-05-18 LAB — MAGNESIUM: Magnesium: 1.9 mg/dL (ref 1.7–2.4)

## 2019-05-18 MED ORDER — BISACODYL 10 MG RE SUPP: 10.0000 mg | Freq: Every day | RECTAL | Status: DC | PRN

## 2019-05-18 NOTE — Progress Notes (Signed)
Inpatient Rehabilitation Admissions Coordinator  I continue to follow patient's progress.  Ottie Glazier, RN, MSN Rehab Admissions Coordinator [phone] 05/18/2019 7:37 PM

## 2019-05-18 NOTE — Progress Notes (Signed)
Subjective: Feeling better.  Objective: Vital signs in last 24 hours: Temp:  [97.4 F (36.3 C)-97.7 F (36.5 C)] 97.7 F (36.5 C) (02/01 0754) Pulse Rate:  [104-121] 121 (02/01 0754) Resp:  [18-25] 25 (02/01 0754) BP: (121-130)/(60-90) 121/83 (02/01 0754) SpO2:  [96 %-100 %] 96 % (02/01 0754) Weight:  [133.8 kg] 133.8 kg (02/01 0500) Last BM Date: 05/17/19  Intake/Output from previous day: 01/31 0701 - 02/01 0700 In: 1920 [P.O.:50; NG/GT:1870] Out: 850 [Urine:850] Intake/Output this shift: No intake/output data recorded.  General appearance: alert and weak, soft spoken Resp: clear to auscultation bilaterally Cardio: regular rate and rhythm and tachycardic GI: soft, non-tender; bowel sounds normal; no masses,  no organomegaly Extremities: extremities normal, atraumatic, no cyanosis or edema  Lab Results: Recent Labs    05/16/19 0222 05/17/19 0242 05/18/19 0255  WBC 8.0 8.9 9.5  HGB 9.8* 10.1* 9.9*  HCT 29.9* 30.9* 30.5*  PLT 173 172 173   BMET Recent Labs    05/16/19 0222 05/17/19 0242 05/18/19 0255  NA 132* 138 140  K 3.6 3.8 4.1  CL 102 104 105  CO2 22 24 25   GLUCOSE 339* 332* 196*  BUN 13 15 14   CREATININE 0.84 0.83 0.77  CALCIUM 9.7 9.8 9.3   LFT Recent Labs    05/18/19 0255  PROT 6.7  ALBUMIN 1.5*  AST 15  ALT 12  ALKPHOS 88  BILITOT 0.2*   PT/INR No results for input(s): LABPROT, INR in the last 72 hours. Hepatitis Panel No results for input(s): HEPBSAG, HCVAB, HEPAIGM, HEPBIGM in the last 72 hours. C-Diff No results for input(s): CDIFFTOX in the last 72 hours. Fecal Lactopherrin No results for input(s): FECLLACTOFRN in the last 72 hours.  Studies/Results: No results found.  Medications:  Scheduled: . allopurinol  300 mg Oral Daily  . bisacodyl  10 mg Rectal Once  . enoxaparin (LOVENOX) injection  40 mg Subcutaneous Q24H  . insulin aspart  0-20 Units Subcutaneous Q4H  . insulin aspart  12 Units Subcutaneous Q4H  . insulin  glargine  20 Units Subcutaneous QHS  . insulin glargine  45 Units Subcutaneous Daily  . pantoprazole (PROTONIX) IV  40 mg Intravenous Q12H   Continuous: . feeding supplement (VITAL AF 1.2 CAL) 1,000 mL (05/17/19 1612)    Assessment/Plan: 1) Acute severe pancreatitis. 2) Pancreatic fluid collection. 3) Severe malnutrition. 4) AMS - resolved.   The patient continues to clinically improve.  Last week he was very weak, somnolent, and lying in bed.  Today he is sitting up, but still very weak.  He was reported to have some confusion the other night, but he is now oriented.  This may be as a result of the pain medication.  His requirement for pain medication is still necessary and his mental status will be monitored closely.  The NG tube is beneficial as his albumin appears to have stabilized.  I anticipate at least another week before discharge.  Last time he was discharged too soon, with resultant severe consequences.  Currently, he does not have an appetite to eat.  Plan: 1) Continue with tube feedings. 2) Continue with pain control. 3) Monitor for any fever. 4) Monitor mental status.  LOS: 6 days   Kamryn Messineo D 05/18/2019, 8:38 AM

## 2019-05-18 NOTE — Progress Notes (Signed)
PT Cancellation Note  Patient Details Name: Caleb Chambers MRN: [id number] DOB: [birth year]   Cancelled Treatment:    Reason Eval/Treat Not Completed: Pain limiting ability to participate. Pt declining participation secondary to stomach pain. Will follow-up for PT treatment as schedule permits.  Ina Homes, PT, DPT Acute Rehabilitation Services  Pager [phone] Office [phone]  [name] 05/18/2019, 4:09 PM

## 2019-05-18 NOTE — Progress Notes (Signed)
Pt became lethargic after going to bathroom. Pt has been having intermittent confusion as well. HR increased to 145. PRN cardizem given HR now 110's, BP 114/81, CBG 187 . On call TRH notified to discuss pt status. On call TRH stated to hold 0400 insulin, ordered an ABG and lactic acid. Will continue to monitor.  Audie Box, RN

## 2019-05-18 NOTE — Progress Notes (Signed)
ABG drawn and sent to the lab at [phone]

## 2019-05-18 NOTE — Progress Notes (Signed)
PROGRESS NOTE  [name] RXV:[id number] DOB: 1966-01-31 DOA: 05/12/2019 PCP: Patient, No Pcp Per   LOS: 6 days   Brief narrative: As per HPI,  Caleb Chambers is a 54 y.o. male with medical history significant of HTN, DM, gout; and recent admission for pancreatitis and DKA  presented to the hospital with complaints of minimal oral intake since his discharge.  Patient reported shortness of breath and decreased ability to eat and drink.  He has not been fully compliant with insulin regimen as well.  Patient was then admitted to the hospital again for DKA and poor oral intolerance.  Assessment/Plan:  Principal Problem:   DKA (diabetic ketoacidosis) (HCC) Active Problems:   Acute pancreatitis   Acute kidney injury (HCC)   Morbid obesity (HCC)   Debility   Protein-calorie malnutrition, severe  Recurrent DKA  Hemoglobin A1c was 13.2 on 1/6, signifying poorly controlled diabetes.  on tube feeding. Currently on basal bolus subcu insulin .Diabetic coordinator on board.   on Lantus to 45 units in am and 20 units at night,and short acting insulin  12 units q4h with resistant scale insulin.  Will closely monitor.  Intractable abdominal pain, nausea vomiting with severe pancreatitis.. Was on on Dilaudid pump.   Improving.  On as needed 50 Dilaudid.  Patient states that he has intermittent abdominal pain.  History of cholecystectomy in the past..  Continue IV Protonix.   CT scan of the abdomen and pelvis was performed which showed severe pancreatitis with large pancreatic fluid collection with dimensions of 17.2 x 17.4 x 16.6 cm. Status post IR guided feeding tube placement. No fever or leukocytosis at this time.  Lipase of 26 on 05/16/2019.  Spoke with GI Dr. Elnoria Howard at bedside.  Tachycardia, history of hypertension.  on IV Cardizem   Hypokalemia Improved with replacement.  Potassium of 4.1 today.  Hypomagnesemia.  Magnesium of 1.9 today.  Improved with replacement.  Hypophosphatemia.  Proceed  with replacement.  Phosphate of 2.9.    Acute kidney injury on presentation.  Likely secondary to acute pancreatitis and volume depletion.  Resolved.  Creatinine of of 0.7 today  Essential hypertension Patient was on Zestoretic and Norvasc at home.  Will continue to hold for now.    Morbid obesity -BMI 43.4, now NPO.  Debility and deconditioning. PT recommends CIR  Severe protein calorie malnutrition present on admission.  Nutrition on board.  On tube feeding at this time.  VTE Prophylaxis: Lovenox subq  Code Status:  Full code  Family Communication:  None today.  Spoke with the patient's brother yesterday  Disposition Plan: CIR, pending clinical improvement.  Patient will likely be in the hospital for several days likely until next week..  Follow GI recommendations  Consultants:  GI  Interventional radiology  Procedures:  Feeding tube placement 05/14/2019  Antibiotics:  Anti-infectives (From admission, onward)   None     Subjective:  Today, overall feels better with less abdominal pain though he has intermittent abdominal pain.  Nursing staff reported that he was confused yesterday.  Tolerating tube feeding.  Objective: Vitals:   05/18/19 0754 05/18/19 0835  BP: 121/83   Pulse: (!) 121   Resp: (!) 25 14  Temp: 97.7 F (36.5 C)   SpO2: 96%     Intake/Output Summary (Last 24 hours) at 05/18/2019 0931 Last data filed at 05/18/2019 0400 Gross per 24 hour  Intake 1920 ml  Output 650 ml  Net 1270 ml   Filed Weights   05/16/19  0500 05/17/19 0354 05/18/19 0500  Weight: 133.4 kg 132.8 kg 133.8 kg   Body mass index is 40.01 kg/m.   Physical Exam: General: Obese, not in obvious distress, weak voice.  Feeding tube in place HENT: Normocephalic, pupils equally reacting to light and accommodation.  No scleral pallor or icterus noted. Oral mucosa is moist.  Right submandibular region with tenderness, mild throat erythema. Chest:  Clear breath sounds.  Diminished  breath sounds bilaterally. No crackles or wheezes.  CVS: S1 &S2 heard. No murmur.  Regular rate and rhythm.  Mildly tachycardic Abdomen: Soft, mildly distended and nonspecific tenderness on palpation over the epigastric and upper abdominal region.  Bowel sounds are heard. Extremities: No cyanosis, clubbing or edema.  Peripheral pulses are palpable. Psych: Alert, awake and oriented, weak voice CNS:  No cranial nerve deficits.  Mild weakness- generalized noted Skin: Warm and dry.  No rashes noted.   Data Review: I have personally reviewed the following laboratory data and studies,  CBC: Recent Labs  Lab 05/14/19 0525 05/15/19 0647 05/16/19 0222 05/17/19 0242 05/18/19 0255  WBC 8.3 7.2 8.0 8.9 9.5  HGB 10.7* 9.8* 9.8* 10.1* 9.9*  HCT 32.9* 29.3* 29.9* 30.9* 30.5*  MCV 74.4* 73.6* 74.9* 75.0* 75.3*  PLT 238 178 173 172 275   Basic Metabolic Panel: Recent Labs  Lab 05/14/19 2003 05/14/19 2003 05/15/19 0647 05/15/19 1747 05/16/19 0222 05/17/19 0242 05/18/19 0255  NA 133*   < > 133* 131* 132* 138 140  K 3.3*   < > 3.3* 4.3 3.6 3.8 4.1  CL 101   < > 104 102 102 104 105  CO2 21*   < > 22 20* 22 24 25   GLUCOSE 211*   < > 199* 382* 339* 332* 196*  BUN 18   < > 16 14 13 15 14   CREATININE 0.98   < > 0.74 0.99 0.84 0.83 0.77  CALCIUM 10.5*   < > 9.8 9.9 9.7 9.8 9.3  MG 2.0  --  1.9  --  1.9 1.6* 1.9  PHOS 2.0*  --  1.3*  --  1.3* 1.6* 2.9   < > = values in this interval not displayed.   Liver Function Tests: Recent Labs  Lab 05/14/19 0525 05/15/19 0647 05/16/19 0222 05/17/19 0242 05/18/19 0255  AST 14* 11* 11* 14* 15  ALT 15 14 14 12 12   ALKPHOS 68 74 83 90 88  BILITOT 0.4 0.4 0.3 0.5 0.2*  PROT 6.0* 5.7* 6.1* 6.6 6.7  ALBUMIN 1.5* 1.4* 1.5* 1.6* 1.5*   Recent Labs  Lab 05/12/19 0953 05/16/19 0222  LIPASE 49 26   No results for input(s): AMMONIA in the last 168 hours. Cardiac Enzymes: No results for input(s): CKTOTAL, CKMB, CKMBINDEX, TROPONINI in the last 168  hours. BNP (last 3 results) Recent Labs    04/23/19 0450  BNP 99.2    ProBNP (last 3 results) No results for input(s): PROBNP in the last 8760 hours.  CBG: Recent Labs  Lab 05/17/19 1552 05/17/19 1948 05/17/19 2330 05/18/19 0407 05/18/19 0802  GLUCAP 326* 259* 199* 187* 280*   Recent Results (from the past 240 hour(s))  SARS CORONAVIRUS 2 (TAT 6-24 HRS) Nasopharyngeal Nasopharyngeal Swab     Status: None   Collection Time: 05/12/19  2:36 PM   Specimen: Nasopharyngeal Swab  Result Value Ref Range Status   SARS Coronavirus 2 NEGATIVE NEGATIVE Final    Comment: (NOTE) SARS-CoV-2 target nucleic acids are NOT DETECTED. The SARS-CoV-2 RNA  is generally detectable in upper and lower respiratory specimens during the acute phase of infection. Negative results do not preclude SARS-CoV-2 infection, do not rule out co-infections with other pathogens, and should not be used as the sole basis for treatment or other patient management decisions. Negative results must be combined with clinical observations, patient history, and epidemiological information. The expected result is Negative. Fact Sheet for Patients: HairSlick.no Fact Sheet for Healthcare Providers: quierodirigir.com This test is not yet approved or cleared by the [place] FDA and  has been authorized for detection and/or diagnosis of SARS-CoV-2 by FDA under an Emergency Use Authorization (EUA). This EUA will remain  in effect (meaning this test can be used) for the duration of the COVID-19 declaration under Section 56 4(b)(1) of the Act, 21 U.S.C. section 360bbb-3(b)(1), unless the authorization is terminated or revoked sooner. Performed at [facility], [address] [place], [place] [address]   MRSA PCR Screening     Status: None   Collection Time: 05/13/19  7:29 PM   Specimen: Nasal Mucosa; Nasopharyngeal  Result Value Ref Range Status   MRSA by  PCR NEGATIVE NEGATIVE Final    Comment:        The GeneXpert MRSA Assay (FDA approved for NASAL specimens only), is one component of a comprehensive MRSA colonization surveillance program. It is not intended to diagnose MRSA infection nor to guide or monitor treatment for MRSA infections. Performed at [facility], [address] [place], [place] [address]      Studies: No results found.  Scheduled Meds: . allopurinol  300 mg Oral Daily  . bisacodyl  10 mg Rectal Once  . enoxaparin (LOVENOX) injection  40 mg Subcutaneous Q24H  . insulin aspart  0-20 Units Subcutaneous Q4H  . insulin aspart  12 Units Subcutaneous Q4H  . insulin glargine  20 Units Subcutaneous QHS  . insulin glargine  45 Units Subcutaneous Daily  . pantoprazole (PROTONIX) IV  40 mg Intravenous Q12H    Continuous Infusions: . feeding supplement (VITAL AF 1.2 CAL) 1,000 mL (05/17/19 1612)     Joycelyn Das, MD  [facility] 05/18/2019

## 2019-05-18 NOTE — Progress Notes (Signed)
Occupational Therapy Treatment Patient Details Name: Caleb Chambers MRN: [id number] DOB: [birth year] Today's Date: 05/18/2019    History of present illness Pt is a 54 year old man admitted 05/12/19 with N/V, abdominal pain and DKA. Pt discharged from [facility] on 04/29/19 with DKA and pancreatitis. He has not been able to eat much since his discharge. PMH: morbid obesity, HTN, gout, Crohn's disease, IDDM.    OT comments  Pt more alert, but continues to complain of 7/10 pain and demonstrate decreased activity tolerance and significant weakness. Pt also with impaired problem solving. Performed bed mobility with min assist and transferred to chair for e UB dressing and grooming with min guard assist. Pt remained up in chair at end of session. Instructed pt in use of call button when he was ready to return to bed.  Follow Up Recommendations  CIR    Equipment Recommendations  3 in 1 bedside commode    Recommendations for Other Services      Precautions / Restrictions Precautions Precautions: Fall Precaution Comments: watch HR       Mobility Bed Mobility Overal bed mobility: Needs Assistance Bed Mobility: Supine to Sit     Supine to sit: Min assist     General bed mobility comments: pulled up on therapist's hand, increased time and effort  Transfers Overall transfer level: Needs assistance Equipment used: Rolling walker (2 wheeled) Transfers: Sit to/from Stand Sit to Stand: Min guard         General transfer comment: cues for hand placement, min guard for safety and lines    Balance Overall balance assessment: Needs assistance   Sitting balance-Leahy Scale: Fair     Standing balance support: Bilateral upper extremity supported Standing balance-Leahy Scale: Poor Standing balance comment: dependent on RW                           ADL either performed or assessed with clinical judgement   ADL Overall ADL's : Needs assistance/impaired     Grooming: Wash/dry  hands;Wash/dry face;Sitting;Set up           Upper Body Dressing : Minimal assistance;Sitting   Lower Body Dressing: Maximal assistance;Bed level Lower Body Dressing Details (indicate cue type and reason): for socks     Toileting- Clothing Manipulation and Hygiene: Total assistance;Sit to/from stand Toileting - Clothing Manipulation Details (indicate cue type and reason): pt with soiled bed linen, assisted with pericare             Vision       Perception     Praxis      Cognition Arousal/Alertness: Awake/alert Behavior During Therapy: WFL for tasks assessed/performed Overall Cognitive Status: Impaired/Different from baseline Area of Impairment: Problem solving;Following commands                       Following Commands: Follows one step commands with increased time     Problem Solving: Slow processing;Decreased initiation;Difficulty sequencing;Requires verbal cues;Requires tactile cues          Exercises     Shoulder Instructions       General Comments      Pertinent Vitals/ Pain       Pain Assessment: 0-10 Pain Score: 7  Pain Location: abdomen Pain Descriptors / Indicators: Constant;Discomfort Pain Intervention(s): Monitored during session;Repositioned  Home Living  Prior Functioning/Environment              Frequency  Min 2X/week        Progress Toward Goals  OT Goals(current goals can now be found in the care plan section)  Progress towards OT goals: Progressing toward goals  Acute Rehab OT Goals Patient Stated Goal: feel better OT Goal Formulation: With patient Time For Goal Achievement: 05/27/19 Potential to Achieve Goals: Good  Plan Discharge plan remains appropriate    Co-evaluation                 AM-PAC OT "6 Clicks" Daily Activity     Outcome Measure   Help from another person eating meals?: Total Help from another person taking care of  personal grooming?: A Little Help from another person toileting, which includes using toliet, bedpan, or urinal?: Total Help from another person bathing (including washing, rinsing, drying)?: A Lot Help from another person to put on and taking off regular upper body clothing?: A Little Help from another person to put on and taking off regular lower body clothing?: Total 6 Click Score: 11    End of Session Equipment Utilized During Treatment: Gait belt;Rolling walker  OT Visit Diagnosis: Unsteadiness on feet (R26.81);Pain;Muscle weakness (generalized) (M62.81)   Activity Tolerance Patient limited by fatigue   Patient Left in chair;with call bell/phone within reach   Nurse Communication          Time: 4562-5638 OT Time Calculation (min): 33 min  Charges: OT General Charges $OT Visit: 1 Visit OT Treatments $Self Care/Home Management : 23-37 mins  Martie Round, OTR/L Acute Rehabilitation Services Pager: [phone] Office: [phone]   Evern Bio 05/18/2019, 1:02 PM

## 2019-05-19 LAB — MAGNESIUM: Magnesium: 1.7 mg/dL (ref 1.7–2.4)

## 2019-05-19 LAB — COMPREHENSIVE METABOLIC PANEL
ALT: 15 U/L (ref 0–44)
AST: 22 U/L (ref 15–41)
Albumin: 1.5 g/dL — ABNORMAL LOW (ref 3.5–5.0)
Alkaline Phosphatase: 96 U/L (ref 38–126)
Anion gap: 9 (ref 5–15)
BUN: 16 mg/dL (ref 6–20)
CO2: 27 mmol/L (ref 22–32)
Calcium: 9 mg/dL (ref 8.9–10.3)
Chloride: 103 mmol/L (ref 98–111)
Creatinine, Ser: 0.81 mg/dL (ref 0.61–1.24)
GFR calc Af Amer: >60 mL/min (ref >60)
GFR calc non Af Amer: >60 mL/min (ref >60)
Glucose, Bld: 191 mg/dL — ABNORMAL HIGH (ref 70–99)
Potassium: 4.2 mmol/L (ref 3.5–5.1)
Sodium: 139 mmol/L (ref 135–145)
Total Bilirubin: 0.4 mg/dL (ref 0.3–1.2)
Total Protein: 6.8 g/dL (ref 6.5–8.1)

## 2019-05-19 LAB — GLUCOSE, CAPILLARY
Glucose-Capillary: 200 mg/dL — ABNORMAL HIGH (ref 70–99)
Glucose-Capillary: 144 mg/dL — ABNORMAL HIGH (ref 70–99)
Glucose-Capillary: 146 mg/dL — ABNORMAL HIGH (ref 70–99)
Glucose-Capillary: 206 mg/dL — ABNORMAL HIGH (ref 70–99)
Glucose-Capillary: 273 mg/dL — ABNORMAL HIGH (ref 70–99)
Glucose-Capillary: 233 mg/dL — ABNORMAL HIGH (ref 70–99)

## 2019-05-19 LAB — CBC
HCT: 31.2 % — ABNORMAL LOW (ref 39.0–52.0)
Hemoglobin: 10.1 g/dL — ABNORMAL LOW (ref 13.0–17.0)
MCH: 24.5 pg — ABNORMAL LOW (ref 26.0–34.0)
MCHC: 32.4 g/dL (ref 30.0–36.0)
MCV: 75.7 fL — ABNORMAL LOW (ref 80.0–100.0)
Platelets: 161 10*3/uL (ref 150–400)
RBC: 4.12 MIL/uL — ABNORMAL LOW (ref 4.22–5.81)
RDW: 15.8 % — ABNORMAL HIGH (ref 11.5–15.5)
WBC: 8.7 10*3/uL (ref 4.0–10.5)
nRBC: 1.3 % — ABNORMAL HIGH (ref 0.0–0.2)

## 2019-05-19 LAB — PHOSPHORUS: Phosphorus: 2.1 mg/dL — ABNORMAL LOW (ref 2.5–4.6)

## 2019-05-19 MED ORDER — POTASSIUM PHOSPHATES 15 MMOLE/5ML IV SOLN
30.0000 mmol | Freq: Once | INTRAVENOUS | Status: AC
  Administered 2019-05-19: 30 mmol via INTRAVENOUS
  Filled 2019-05-19: qty 10

## 2019-05-19 MED ORDER — ORAL CARE MOUTH RINSE
15.0000 mL | Freq: Two times a day (BID) | OROMUCOSAL | Status: DC
  Administered 2019-05-19 – 2019-06-05 (×21): 15 mL via OROMUCOSAL

## 2019-05-19 MED ORDER — CHLORHEXIDINE GLUCONATE 0.12 % MT SOLN
15.0000 mL | Freq: Two times a day (BID) | OROMUCOSAL | Status: DC
  Administered 2019-05-19 – 2019-06-18 (×31): 15 mL via OROMUCOSAL
  Filled 2019-05-19 (×38): qty 15

## 2019-05-19 MED ORDER — PRO-STAT SUGAR FREE PO LIQD
30.0000 mL | Freq: Three times a day (TID) | ORAL | Status: DC
  Administered 2019-05-19 – 2019-05-30 (×26): 30 mL
  Filled 2019-05-19 (×27): qty 30

## 2019-05-19 MED ORDER — VITAL 1.5 CAL PO LIQD
1000.0000 mL | ORAL | Status: DC
  Administered 2019-05-19 – 2019-05-25 (×8): 1000 mL
  Filled 2019-05-19 (×20): qty 1000

## 2019-05-19 NOTE — Progress Notes (Addendum)
Nutrition Follow up   DOCUMENTATION CODES:   Severe malnutrition in context of acute illness/injury  INTERVENTION:   Change tube feeding:  -Vital 1.5 @ 60 ml/hr via Cortrak (1440 ml)  -30 ml Prostat TID  Provides: 2460 kcals, 142 grams protein, 1100 ml free water.   NUTRITION DIAGNOSIS:   Severe Malnutrition related to acute illness(pancreatitis) as evidenced by energy intake < or equal to 50% for > or equal to 5 days, moderate fat depletion, mild fat depletion, moderate muscle depletion, mild muscle depletion.  Ongoing  GOAL:   Patient will meet greater than or equal to 90% of their needs  Addressed via TF  MONITOR:   TF tolerance, Weight trends, Labs, I & O's  REASON FOR ASSESSMENT:   Consult Assessment of nutrition requirement/status  ASSESSMENT:   Pt with a PMH significant for HTN, DM, gout, and recent admission for pancreatitis and DKA presented to ED with DKA, abdominal pain, SOB, and minimal PO intake since last admission.   1/28- s/p post pyloric small bore feeding tube placement (reads at the LOT)  Pt complained of abdominal pain upon RD follow up. Continued to ask for PCA pump. Remains NPO. Having good BMs on current tube feeding regimen. Will change feeding to concentrated formula to provide lower volume.   Admission weight: 133.3 kg  Current weight: 121.2 kg   I/O: +10,174 ml since admit  UOP: 950 ml x 24 hrs   Medications: SS novolog, lantus Labs: Phosphorus 2.1 (L) CBG 144-338  Diet Order:   Diet Order            Diet NPO time specified  Diet effective now              EDUCATION NEEDS:   Not appropriate for education at this time  Skin:  Skin Assessment: Reviewed RN Assessment  Last BM:  2/1  Height:   Ht Readings from Last 1 Encounters:  05/12/19 6' (1.829 m)    Weight:   Wt Readings from Last 1 Encounters:  05/19/19 121.2 kg    Ideal Body Weight:  80.9 kg  BMI:  Body mass index is 36.24 kg/m.  Estimated Nutritional  Needs:   Kcal:  2300-2500  Protein:  130-145 grams  Fluid:  >2L/d  Vanessa Kick RD, LDN Clinical Nutrition Pager # [phone]

## 2019-05-19 NOTE — Plan of Care (Signed)
  Problem: Education: Goal: Ability to describe self-care measures that may prevent or decrease complications (Diabetes Survival Skills Education) will improve Outcome: Progressing Goal: Individualized Educational Video(s) Outcome: Progressing   Problem: Cardiac: Goal: Ability to maintain an adequate cardiac output will improve Outcome: Progressing   Problem: Health Behavior/Discharge Planning: Goal: Ability to identify and utilize available resources and services will improve Outcome: Progressing Goal: Ability to manage health-related needs will improve Outcome: Progressing   Problem: Fluid Volume: Goal: Ability to achieve a balanced intake and output will improve Outcome: Progressing   Problem: Metabolic: Goal: Ability to maintain appropriate glucose levels will improve Outcome: Progressing   Problem: Nutritional: Goal: Maintenance of adequate nutrition will improve Outcome: Progressing Goal: Maintenance of adequate weight for body size and type will improve Outcome: Progressing   Problem: Respiratory: Goal: Will regain and/or maintain adequate ventilation Outcome: Progressing   Problem: Urinary Elimination: Goal: Ability to achieve and maintain adequate renal perfusion and functioning will improve Outcome: Progressing   Problem: Education: Goal: Knowledge of General Education information will improve Description: Including pain rating scale, medication(s)/side effects and non-pharmacologic comfort measures Outcome: Progressing   Problem: Health Behavior/Discharge Planning: Goal: Ability to manage health-related needs will improve Outcome: Progressing   Problem: Clinical Measurements: Goal: Ability to maintain clinical measurements within normal limits will improve Outcome: Progressing Goal: Will remain free from infection Outcome: Progressing Goal: Diagnostic test results will improve Outcome: Progressing Goal: Respiratory complications will improve Outcome:  Progressing Goal: Cardiovascular complication will be avoided Outcome: Progressing   Problem: Activity: Goal: Risk for activity intolerance will decrease Outcome: Progressing   Problem: Nutrition: Goal: Adequate nutrition will be maintained Outcome: Progressing   Problem: Coping: Goal: Level of anxiety will decrease Outcome: Progressing   Problem: Elimination: Goal: Will not experience complications related to bowel motility Outcome: Progressing Goal: Will not experience complications related to urinary retention Outcome: Progressing   Problem: Pain Managment: Goal: General experience of comfort will improve Outcome: Progressing   Problem: Safety: Goal: Ability to remain free from injury will improve Outcome: Progressing   Problem: Skin Integrity: Goal: Risk for impaired skin integrity will decrease Outcome: Progressing   

## 2019-05-19 NOTE — Progress Notes (Signed)
Subjective: Reports feeling better.  Objective: Vital signs in last 24 hours: Temp:  [97.5 F (36.4 C)-98.5 F (36.9 C)] 98.1 F (36.7 C) (02/02 0731) Pulse Rate:  [102-117] 103 (02/02 0500) Resp:  [14-27] 24 (02/02 0500) BP: (117-150)/(66-90) 145/78 (02/02 0352) SpO2:  [94 %-100 %] 98 % (02/02 0500) Weight:  [121.2 kg] 121.2 kg (02/02 0352) Last BM Date: 05/18/19  Intake/Output from previous day: 02/01 0701 - 02/02 0700 In: -  Out: 950 [Urine:950] Intake/Output this shift: No intake/output data recorded.  General appearance: more alert and stronger in voice Resp: clear to auscultation bilaterally Cardio: regular rate and rhythm GI: soft, non-tender; bowel sounds normal; no masses,  no organomegaly Extremities: extremities normal, atraumatic, no cyanosis or edema  Lab Results: Recent Labs    05/17/19 0242 05/18/19 0255 05/19/19 0224  WBC 8.9 9.5 8.7  HGB 10.1* 9.9* 10.1*  HCT 30.9* 30.5* 31.2*  PLT 172 173 161   BMET Recent Labs    05/17/19 0242 05/18/19 0255 05/19/19 0224  NA 138 140 139  K 3.8 4.1 4.2  CL 104 105 103  CO2 24 25 27   GLUCOSE 332* 196* 191*  BUN 15 14 16   CREATININE 0.83 0.77 0.81  CALCIUM 9.8 9.3 9.0   LFT Recent Labs    05/19/19 0224  PROT 6.8  ALBUMIN 1.5*  AST 22  ALT 15  ALKPHOS 96  BILITOT 0.4   PT/INR No results for input(s): LABPROT, INR in the last 72 hours. Hepatitis Panel No results for input(s): HEPBSAG, HCVAB, HEPAIGM, HEPBIGM in the last 72 hours. C-Diff No results for input(s): CDIFFTOX in the last 72 hours. Fecal Lactopherrin No results for input(s): FECLLACTOFRN in the last 72 hours.  Studies/Results: No results found.  Medications:  Scheduled: . allopurinol  300 mg Oral Daily  . chlorhexidine  15 mL Mouth Rinse BID  . enoxaparin (LOVENOX) injection  40 mg Subcutaneous Q24H  . insulin aspart  0-20 Units Subcutaneous Q4H  . insulin aspart  12 Units Subcutaneous Q4H  . insulin glargine  20 Units  Subcutaneous QHS  . insulin glargine  45 Units Subcutaneous Daily  . mouth rinse  15 mL Mouth Rinse q12n4p  . pantoprazole (PROTONIX) IV  40 mg Intravenous Q12H   Continuous: . feeding supplement (VITAL AF 1.2 CAL) 1,000 mL (05/19/19 0459)  . potassium PHOSPHATE IVPB (in mmol)      Assessment/Plan: 1) Acute severe pancreatitis. 2) DM. 3) Severe malnutrition.   Small incremental improvements.  His pain is under reasonable control, but it can spike up significant at times.  Plan: 1) Continue with the current management.  LOS: 7 days   Kyli Sorter D 05/19/2019, 8:03 AM

## 2019-05-19 NOTE — Progress Notes (Signed)
Physical Therapy Treatment Patient Details Name: Caleb Chambers MRN: [id number] DOB: May 08, 1965 Today's Date: 05/19/2019    History of Present Illness Pt is a 54 year old man admitted 05/12/19 with N/V, abdominal pain and DKA. Pt discharged from [facility] on 04/29/19 with DKA and pancreatitis. He has not been able to eat much since his discharge. PMH: morbid obesity, HTN, gout, Crohn's disease, IDDM.     PT Comments    Patient sitting in chair asking for assist back to bed upon PT arrival. Reports having 9/10 abdominal pain. Noted to have elevated HR up to 128 and increased RR sitting in chair. Min guard assist to stand from chair and Min A for SPT back to bed. Declined further ambulation due to abdominal pain. Instructed pt in LE exercises to perform throughout the day. Will continue to follow to progress mobility.    Follow Up Recommendations  CIR;Supervision/Assistance - 24 hour     Equipment Recommendations  Rolling walker with 5" wheels    Recommendations for Other Services       Precautions / Restrictions Precautions Precautions: Fall Precaution Comments: watch HR, NG tube Restrictions Weight Bearing Restrictions: No    Mobility  Bed Mobility Overal bed mobility: Needs Assistance Bed Mobility: Sit to Supine       Sit to supine: Min guard;HOB elevated   General bed mobility comments: Able to bring LEs into bed to return to supine.  Transfers Overall transfer level: Needs assistance Equipment used: None Transfers: Sit to/from Stand Sit to Stand: Min guard Stand pivot transfers: Min assist       General transfer comment: Min guard for safety. Stood from Orthoptist. Min A for SPT from chair to bed, bil knee instability noted. HR up to 128 bpm and pt with increased RR. 2/4 DOE. Declined further due to abdominal pain.  Ambulation/Gait             General Gait Details: Declined due to pain.   Stairs             Wheelchair Mobility    Modified Rankin  (Stroke Patients Only)       Balance Overall balance assessment: Needs assistance Sitting-balance support: Feet supported;No upper extremity supported Sitting balance-Leahy Scale: Fair Sitting balance - Comments: supervision   Standing balance support: During functional activity Standing balance-Leahy Scale: Poor Standing balance comment: Requires external support                            Cognition Arousal/Alertness: Awake/alert Behavior During Therapy: WFL for tasks assessed/performed Overall Cognitive Status: Impaired/Different from baseline Area of Impairment: Following commands;Problem solving                       Following Commands: Follows one step commands with increased time     Problem Solving: Slow processing;Decreased initiation;Difficulty sequencing;Requires verbal cues;Requires tactile cues        Exercises      General Comments General comments (skin integrity, edema, etc.): HR up to 128 bpm.      Pertinent Vitals/Pain Pain Assessment: 0-10 Pain Score: 9  Pain Location: abdomen Pain Descriptors / Indicators: Constant;Discomfort Pain Intervention(s): Repositioned;Monitored during session;Limited activity within patient's tolerance;Patient requesting pain meds-RN notified    Home Living                      Prior Function  PT Goals (current goals can now be found in the care plan section) Progress towards PT goals: Progressing toward goals    Frequency    Min 3X/week      PT Plan Current plan remains appropriate    Co-evaluation              AM-PAC PT "6 Clicks" Mobility   Outcome Measure  Help needed turning from your back to your side while in a flat bed without using bedrails?: A Little Help needed moving from lying on your back to sitting on the side of a flat bed without using bedrails?: A Little Help needed moving to and from a bed to a chair (including a wheelchair)?: A Little Help  needed standing up from a chair using your arms (e.g., wheelchair or bedside chair)?: A Little Help needed to walk in hospital room?: A Little Help needed climbing 3-5 steps with a railing? : A Lot 6 Click Score: 17    End of Session   Activity Tolerance: Patient limited by pain Patient left: in bed;with call bell/phone within reach;with bed alarm set Nurse Communication: Mobility status;Patient requests pain meds PT Visit Diagnosis: Unsteadiness on feet (R26.81);Muscle weakness (generalized) (M62.81);Difficulty in walking, not elsewhere classified (R26.2);Pain Pain - part of body: (abdomen)     Time: 7867-6720 PT Time Calculation (min) (ACUTE ONLY): 13 min  Charges:  $Therapeutic Activity: 8-22 mins                     Marisa Severin, PT, DPT Acute Rehabilitation Services Pager [phone] Office [phone]       Marguarite Arbour A Sabra Heck 05/19/2019, 12:46 PM

## 2019-05-19 NOTE — Plan of Care (Signed)

## 2019-05-19 NOTE — Progress Notes (Signed)
PROGRESS NOTE  Caleb Chambers PFX:[id number] DOB: May 11, 1965 DOA: 05/12/2019 PCP: Patient, No Pcp Per   LOS: 7 days   Brief narrative: As per HPI,  Caleb Chambers is a 54 y.o. male with medical history significant of HTN, DM, gout; and recent admission for pancreatitis and DKA  presented to the hospital with complaints of minimal oral intake since his discharge.  Patient reported shortness of breath and decreased ability to eat and drink.  He has not been fully compliant with insulin regimen as well.  Patient was then admitted to the hospital again for DKA and poor oral intolerance.  Assessment/Plan:  Principal Problem:   DKA (diabetic ketoacidosis) (HCC) Active Problems:   Acute pancreatitis   Acute kidney injury (HCC)   Morbid obesity (HCC)   Debility   Protein-calorie malnutrition, severe  Recurrent DKA  Hemoglobin A1c was 13.2 on 1/6, signifying poorly controlled diabetes.  on tube feeding. Currently on basal bolus subcu insulin .Diabetic coordinator on board.   on Lantus to 45 units in am and 20 units at night,and short acting insulin  12 units q4h with resistant scale insulin.  Will closely monitor.  Last POC glucose of 146.  Intractable abdominal pain, nausea vomiting with severe pancreatitis.. Continue NPO.  Improving.  Has intermittent abdominal pain.  On Dilaudid IV as needed.   History of cholecystectomy in the past..  Continue IV Protonix.   CT scan of the abdomen and pelvis was performed which showed severe pancreatitis with large pancreatic fluid collection with dimensions of 17.2 x 17.4 x 16.6 cm. Status post IR guided feeding tube placement. No fever or leukocytosis..  Lipase of 26 on 05/16/2019.  Expecting conservative management and slow recovery at this time.  GI following the patient, will follow recommendation.  Mild tachycardia, history of hypertension.  on IV Cardizem   Hypokalemia Improved with replacement.  Potassium of 4.2 today.  Hypomagnesemia.  Magnesium  of 1.7 today.  Improved with replacement.  Hypophosphatemia.  Will provide potassium phosphate IV today.  Phosphate of 2.1.    Acute kidney injury on presentation.  Likely secondary to acute pancreatitis and volume depletion. improved.  Creatinine of of 0.8 today  Essential hypertension Patient was on Zestoretic and Norvasc at home.  Will continue to hold for now.  On as needed Cardizem at this time.  Morbid obesity -BMI 43.4, now NPO.  Debility and deconditioning. PT has seen the patient and recommend CIR  Severe protein calorie malnutrition present on admission.  Nutrition on board.  On tube feeding at this time.  VTE Prophylaxis: Lovenox subq  Code Status:  Full code  Family Communication:  None today.  Spoke with the patient at bedside.  Disposition Plan: CIR, pending clinical improvement.  Patient will likely be in the hospital for few more days..  Follow GI recommendations  Consultants:  GI  Interventional radiology  Procedures:  Feeding tube placement 05/14/2019  Antibiotics:  Anti-infectives (From admission, onward)   None     Subjective:  Today, patient was seen and examined at bedside.  Patient complains of intermittent abdominal pain.  Denies any vomiting.  Had a bowel movement today.    Objective: Vitals:   05/19/19 0500 05/19/19 0731  BP:    Pulse: (!) 103   Resp: (!) 24   Temp:  98.1 F (36.7 C)  SpO2: 98%     Intake/Output Summary (Last 24 hours) at 05/19/2019 3299 Last data filed at 05/19/2019 0300 Gross per 24 hour  Intake --  Output 950 ml  Net -950 ml   Filed Weights   05/17/19 0354 05/18/19 0500 05/19/19 0352  Weight: 132.8 kg 133.8 kg 121.2 kg   Body mass index is 36.24 kg/m.   Physical Exam: General: Obese built, not in obvious distress, weak voice.  Feeding tube in place HENT: Normocephalic, pupils equally reacting to light and accommodation.  No scleral pallor or icterus noted. Oral mucosa is moist.  Chest:  Clear breath  sounds.   No crackles or wheezes.  CVS: S1 &S2 heard. No murmur.  Regular rate and rhythm.  Mildly tachycardic Abdomen: Soft , obese.  Nonspecific tenderness noted.  Bowel sounds are heard.   Extremities: No cyanosis, clubbing or edema.  Peripheral pulses are palpable. Psych: Alert, awake and oriented, normal mood CNS:  No cranial nerve deficits.  Generalized weakness noted in all extremities. Skin: Warm and dry.  No rashes noted.   Data Review: I have personally reviewed the following laboratory data and studies,  CBC: Recent Labs  Lab 05/15/19 0647 05/16/19 0222 05/17/19 0242 05/18/19 0255 05/19/19 0224  WBC 7.2 8.0 8.9 9.5 8.7  HGB 9.8* 9.8* 10.1* 9.9* 10.1*  HCT 29.3* 29.9* 30.9* 30.5* 31.2*  MCV 73.6* 74.9* 75.0* 75.3* 75.7*  PLT 178 173 172 173 161   Basic Metabolic Panel: Recent Labs  Lab 05/15/19 0647 05/15/19 0647 05/15/19 1747 05/16/19 0222 05/17/19 0242 05/18/19 0255 05/19/19 0224  NA 133*   < > 131* 132* 138 140 139  K 3.3*   < > 4.3 3.6 3.8 4.1 4.2  CL 104   < > 102 102 104 105 103  CO2 22   < > 20* 22 24 25 27   GLUCOSE 199*   < > 382* 339* 332* 196* 191*  BUN 16   < > 14 13 15 14 16   CREATININE 0.74   < > 0.99 0.84 0.83 0.77 0.81  CALCIUM 9.8   < > 9.9 9.7 9.8 9.3 9.0  MG 1.9  --   --  1.9 1.6* 1.9 1.7  PHOS 1.3*  --   --  1.3* 1.6* 2.9 2.1*   < > = values in this interval not displayed.   Liver Function Tests: Recent Labs  Lab 05/15/19 0647 05/16/19 0222 05/17/19 0242 05/18/19 0255 05/19/19 0224  AST 11* 11* 14* 15 22  ALT 14 14 12 12 15   ALKPHOS 74 83 90 88 96  BILITOT 0.4 0.3 0.5 0.2* 0.4  PROT 5.7* 6.1* 6.6 6.7 6.8  ALBUMIN 1.4* 1.5* 1.6* 1.5* 1.5*   Recent Labs  Lab 05/12/19 0953 05/16/19 0222  LIPASE 49 26   No results for input(s): AMMONIA in the last 168 hours. Cardiac Enzymes: No results for input(s): CKTOTAL, CKMB, CKMBINDEX, TROPONINI in the last 168 hours. BNP (last 3 results) Recent Labs    04/23/19 0450  BNP 99.2      ProBNP (last 3 results) No results for input(s): PROBNP in the last 8760 hours.  CBG: Recent Labs  Lab 05/18/19 1627 05/18/19 2031 05/19/19 0014 05/19/19 0429 05/19/19 0733  GLUCAP 237* 217* 200* 144* 146*   Recent Results (from the past 240 hour(s))  SARS CORONAVIRUS 2 (TAT 6-24 HRS) Nasopharyngeal Nasopharyngeal Swab     Status: None   Collection Time: 05/12/19  2:36 PM   Specimen: Nasopharyngeal Swab  Result Value Ref Range Status   SARS Coronavirus 2 NEGATIVE NEGATIVE Final    Comment: (NOTE) SARS-CoV-2 target nucleic acids are NOT DETECTED. The SARS-CoV-2  RNA is generally detectable in upper and lower respiratory specimens during the acute phase of infection. Negative results do not preclude SARS-CoV-2 infection, do not rule out co-infections with other pathogens, and should not be used as the sole basis for treatment or other patient management decisions. Negative results must be combined with clinical observations, patient history, and epidemiological information. The expected result is Negative. Fact Sheet for Patients: SugarRoll.be Fact Sheet for Healthcare Providers: https://www.woods-mathews.com/ This test is not yet approved or cleared by the [place] FDA and  has been authorized for detection and/or diagnosis of SARS-CoV-2 by FDA under an Emergency Use Authorization (EUA). This EUA will remain  in effect (meaning this test can be used) for the duration of the COVID-19 declaration under Section 56 4(b)(1) of the Act, 21 U.S.C. section 360bbb-3(b)(1), unless the authorization is terminated or revoked sooner. Performed at [facility], [place] [address] [place], [place] [address]   MRSA PCR Screening     Status: None   Collection Time: 05/13/19  7:29 PM   Specimen: Nasal Mucosa; Nasopharyngeal  Result Value Ref Range Status   MRSA by PCR NEGATIVE NEGATIVE Final    Comment:        The GeneXpert MRSA Assay  (FDA approved for NASAL specimens only), is one component of a comprehensive MRSA colonization surveillance program. It is not intended to diagnose MRSA infection nor to guide or monitor treatment for MRSA infections. Performed at [facility], [place] [address], [place], [place] [address]      Studies: No results found.  Scheduled Meds: . allopurinol  300 mg Oral Daily  . chlorhexidine  15 mL Mouth Rinse BID  . enoxaparin (LOVENOX) injection  40 mg Subcutaneous Q24H  . insulin aspart  0-20 Units Subcutaneous Q4H  . insulin aspart  12 Units Subcutaneous Q4H  . insulin glargine  20 Units Subcutaneous QHS  . insulin glargine  45 Units Subcutaneous Daily  . mouth rinse  15 mL Mouth Rinse q12n4p  . pantoprazole (PROTONIX) IV  40 mg Intravenous Q12H    Continuous Infusions: . feeding supplement (VITAL AF 1.2 CAL) 1,000 mL (05/19/19 0459)  . potassium PHOSPHATE IVPB (in mmol) 30 mmol (05/19/19 0855)     Flora Lipps, MD  [facility] 05/19/2019

## 2019-05-20 LAB — CBC WITH DIFFERENTIAL/PLATELET
Abs Immature Granulocytes: 0 10*3/uL (ref 0.00–0.07)
Basophils Absolute: 0 10*3/uL (ref 0.0–0.1)
Basophils Relative: 0 %
Eosinophils Absolute: 0 10*3/uL (ref 0.0–0.5)
Eosinophils Relative: 0 %
HCT: 30.4 % — ABNORMAL LOW (ref 39.0–52.0)
Hemoglobin: 9.7 g/dL — ABNORMAL LOW (ref 13.0–17.0)
Lymphocytes Relative: 18 %
Lymphs Abs: 1.5 10*3/uL (ref 0.7–4.0)
MCH: 24.3 pg — ABNORMAL LOW (ref 26.0–34.0)
MCHC: 31.9 g/dL (ref 30.0–36.0)
MCV: 76.2 fL — ABNORMAL LOW (ref 80.0–100.0)
Monocytes Absolute: 0.7 10*3/uL (ref 0.1–1.0)
Monocytes Relative: 8 %
Neutro Abs: 6.1 10*3/uL (ref 1.7–7.7)
Neutrophils Relative %: 74 %
Platelets: 186 10*3/uL (ref 150–400)
RBC: 3.99 MIL/uL — ABNORMAL LOW (ref 4.22–5.81)
RDW: 16 % — ABNORMAL HIGH (ref 11.5–15.5)
WBC: 8.2 10*3/uL (ref 4.0–10.5)
nRBC: 1.1 % — ABNORMAL HIGH (ref 0.0–0.2)
nRBC: 4 /100 WBC — ABNORMAL HIGH

## 2019-05-20 LAB — GLUCOSE, CAPILLARY
Glucose-Capillary: 106 mg/dL — ABNORMAL HIGH (ref 70–99)
Glucose-Capillary: 106 mg/dL — ABNORMAL HIGH (ref 70–99)
Glucose-Capillary: 149 mg/dL — ABNORMAL HIGH (ref 70–99)
Glucose-Capillary: 156 mg/dL — ABNORMAL HIGH (ref 70–99)
Glucose-Capillary: 165 mg/dL — ABNORMAL HIGH (ref 70–99)
Glucose-Capillary: 248 mg/dL — ABNORMAL HIGH (ref 70–99)

## 2019-05-20 LAB — COMPREHENSIVE METABOLIC PANEL
ALT: 23 U/L (ref 0–44)
AST: 29 U/L (ref 15–41)
Albumin: 1.5 g/dL — ABNORMAL LOW (ref 3.5–5.0)
Alkaline Phosphatase: 94 U/L (ref 38–126)
Anion gap: 16 — ABNORMAL HIGH (ref 5–15)
BUN: 15 mg/dL (ref 6–20)
CO2: 27 mmol/L (ref 22–32)
Calcium: 9.1 mg/dL (ref 8.9–10.3)
Chloride: 101 mmol/L (ref 98–111)
Creatinine, Ser: 0.71 mg/dL (ref 0.61–1.24)
GFR calc Af Amer: >60 mL/min (ref >60)
GFR calc non Af Amer: >60 mL/min (ref >60)
Glucose, Bld: 114 mg/dL — ABNORMAL HIGH (ref 70–99)
Potassium: 4.1 mmol/L (ref 3.5–5.1)
Sodium: 144 mmol/L (ref 135–145)
Total Bilirubin: 0.6 mg/dL (ref 0.3–1.2)
Total Protein: 6.9 g/dL (ref 6.5–8.1)

## 2019-05-20 LAB — MAGNESIUM: Magnesium: 1.5 mg/dL — ABNORMAL LOW (ref 1.7–2.4)

## 2019-05-20 MED ORDER — MAGNESIUM SULFATE 2 GM/50ML IV SOLN
2.0000 g | Freq: Once | INTRAVENOUS | Status: AC
  Administered 2019-05-20: 11:00:00 2 g via INTRAVENOUS
  Filled 2019-05-20: qty 50

## 2019-05-20 MED ORDER — LIDOCAINE VISCOUS HCL 2 % MT SOLN: 15.0000 mL | OROMUCOSAL | Status: DC | PRN

## 2019-05-20 NOTE — Progress Notes (Signed)
Physical Therapy Treatment Patient Details Name: Caleb Chambers MRN: [id number] DOB: [birth year] Today's Date: 05/20/2019    History of Present Illness Pt is a 54 year old man admitted 05/12/19 with N/V, abdominal pain and DKA. Pt discharged from [facility] [place] on 04/29/19 with DKA and pancreatitis. He has not been able to eat much since his discharge. PMH: morbid obesity, HTN, gout, Crohn's disease, IDDM.     PT Comments    Patient is making progress toward PT goals and eager to participate. Pt tolerated gait training well this session. Pt with no c/o pain. HR 120s to 140s.  Pt continues to be a good candidate for CIR.    Follow Up Recommendations  CIR;Supervision/Assistance - 24 hour     Equipment Recommendations  Rolling walker with 5" wheels    Recommendations for Other Services Rehab consult     Precautions / Restrictions Precautions Precautions: Fall Precaution Comments: watch HR, Cortrak    Mobility  Bed Mobility Overal bed mobility: Needs Assistance Bed Mobility: Supine to Sit     Supine to sit: Min guard     General bed mobility comments: no physical assist, HOB up, assist for lines  Transfers Overall transfer level: Needs assistance Equipment used: Rolling walker (2 wheeled) Transfers: Sit to/from Stand Sit to Stand: Min guard         General transfer comment: cues for hand placement, stood from elevated bed and from chair  Ambulation/Gait Ambulation/Gait assistance: Min assist;Mod assist;+2 safety/equipment(chair follow) Gait Distance (Feet): (70 ft with seated rest break) Assistive device: Rolling walker (2 wheeled) Gait Pattern/deviations: Step-through pattern;Decreased stride length;Scissoring;Narrow base of support Gait velocity: decreased   General Gait Details: pt requires assistance for balance; LOB X 1 requiring assistance to recover; cues for upright posture and increased BOS; second bout pt with less scissoring gait noted   Stairs              Wheelchair Mobility    Modified Rankin (Stroke Patients Only)       Balance Overall balance assessment: Needs assistance   Sitting balance-Leahy Scale: Fair Sitting balance - Comments: no LOB with donning R sock   Standing balance support: During functional activity Standing balance-Leahy Scale: Poor Standing balance comment: Requires RW and min assist                            Cognition Arousal/Alertness: Awake/alert Behavior During Therapy: WFL for tasks assessed/performed Overall Cognitive Status: Impaired/Different from baseline Area of Impairment: Following commands;Problem solving                       Following Commands: Follows one step commands with increased time;Follows one step commands inconsistently     Problem Solving: Slow processing;Decreased initiation;Difficulty sequencing;Requires verbal cues;Requires tactile cues        Exercises      General Comments General comments (skin integrity, edema, etc.): HR up to 140s with mobility and in 120s at rest beginning of session      Pertinent Vitals/Pain Pain Assessment: No/denies pain    Home Living                      Prior Function            PT Goals (current goals can now be found in the care plan section) Acute Rehab PT Goals Patient Stated Goal: feel better Progress towards PT goals: Progressing toward goals  Frequency    Min 3X/week      PT Plan Current plan remains appropriate    Co-evaluation PT/OT/SLP Co-Evaluation/Treatment: Yes Reason for Co-Treatment: For patient/therapist safety;To address functional/ADL transfers   OT goals addressed during session: ADL's and self-care;Proper use of Adaptive equipment and DME      AM-PAC PT "6 Clicks" Mobility   Outcome Measure  Help needed turning from your back to your side while in a flat bed without using bedrails?: A Little Help needed moving from lying on your back to sitting on the side of  a flat bed without using bedrails?: A Little Help needed moving to and from a bed to a chair (including a wheelchair)?: A Little Help needed standing up from a chair using your arms (e.g., wheelchair or bedside chair)?: A Little Help needed to walk in hospital room?: A Little Help needed climbing 3-5 steps with a railing? : A Lot 6 Click Score: 17    End of Session Equipment Utilized During Treatment: Gait belt Activity Tolerance: Patient tolerated treatment well Patient left: with call bell/phone within reach;in chair Nurse Communication: Mobility status PT Visit Diagnosis: Unsteadiness on feet (R26.81);Muscle weakness (generalized) (M62.81);Difficulty in walking, not elsewhere classified (R26.2);Pain     Time: 4656-8127 PT Time Calculation (min) (ACUTE ONLY): 27 min  Charges:  $Gait Training: 8-22 mins                     Erline Levine, PTA Acute Rehabilitation Services Pager: [phone] Office: [phone]     Carolynne Edouard 05/20/2019, 4:56 PM

## 2019-05-20 NOTE — Progress Notes (Signed)
PROGRESS NOTE  Caleb Chambers [id number]:[id number] DOB: Apr 01, 1966 DOA: 05/12/2019 PCP: Patient, No Pcp Per   LOS: 8 days   Brief narrative: As per HPI,  Caleb Chambers is a 54 y.o. male with medical history significant of HTN, DM, gout; and recent admission for pancreatitis and DKA  presented to the hospital with complaints of minimal oral intake since his discharge.  Patient reported shortness of breath and decreased ability to eat and drink.  He has not been fully compliant with insulin regimen as well.  Patient was then admitted to the hospital again for DKA and poor oral intolerance.  Assessment/Plan:  Principal Problem:   DKA (diabetic ketoacidosis) ([place]) Active Problems:   Acute pancreatitis   Acute kidney injury ([place])   Morbid obesity ([place])   Debility   Protein-calorie malnutrition, severe  Recurrent DKA  Hemoglobin A1c was 13.2 on 1/6  on tube feeding. Currently on basal bolus and subcu insulin .Diabetic coordinator on board.   on Lantus to 45 units in am and 20 units at night,and short acting insulin  12 units q4h with resistant scale insulin.  Will closely monitor.  Last POC glucose of 106  Intractable abdominal pain, nausea vomiting with severe pancreatitis.. Continue NPO.   On Dilaudid IV as needed received 3 doses of 1 mg since yesterday 4 pm till 4 am.   History of cholecystectomy in the past..  Continue IV Protonix.   CT scan of the abdomen and pelvis was performed which showed severe pancreatitis with large pancreatic fluid collection with dimensions of 17.2 x 17.4 x 16.6 cm. Status post IR guided feeding tube placement. No fever or leukocytosis..  Lipase of 26 on 05/16/2019.  Expecting conservative management and slow recovery at this time.  GI following the patient, will follow recommendation.  Mild sore throat.  Add lidocaine viscous.    Mild tachycardia, history of hypertension.  on IV Cardizem   Hypokalemia Improved with replacement.  Potassium of 4.2  today.  Hypomagnesemia.   Magnesium of 1.5 today.  Will replace with magnesium sulfate.  Hypophosphatemia.  Received potassium phosphate IV  for 2.1.  Check levels in am.  Acute kidney injury on presentation.  Likely secondary to acute pancreatitis and volume depletion. Resolved.  Essential hypertension Patient was on Zestoretic and Norvasc at home.  Will continue to hold for now.  On as needed IV Cardizem at this time.   Morbid obesity -BMI 43.4, now NPO.  Debility and deconditioning. PT recommend CIR  Severe protein calorie malnutrition present on admission.  Nutrition on board.  On tube feeding at this time.  Albumin of 1.5.  VTE Prophylaxis: Lovenox subq  Code Status:  Full code  Family Communication:  None today.  Disposition Plan: CIR as recommended by physical therapy.  Will follow GI recommendation.  Continue tube feeding.  Continue conservative treatment.  Continue n.p.o. for now.  Consultants:  GI  Interventional radiology  Procedures:  Cortrak feeding tube placement 05/14/2019  Antibiotics:  Anti-infectives (From admission, onward)   None     Subjective:  Today, patient still complains of abdominal pain and had required IV Dilaudid overnight.  Denies any nausea, vomiting.  Complains of pain especially in the nighttime around 9/ 10 in intensity.  Currently pain at 5/ 10 in intensity.  Has a mild sore throat as well.  Denies any fever, chills or rigor.  Denies shortness of breath cough fever.  Objective: Vitals:   05/20/19 0439 05/20/19 0702  BP:  [name])  149/77  Pulse:    Resp: 17 (!) 24  Temp:  98.1 F (36.7 C)  SpO2:      Intake/Output Summary (Last 24 hours) at 05/20/2019 0750 Last data filed at 05/20/2019 0300 Gross per 24 hour  Intake 3499 ml  Output 200 ml  Net 3299 ml   Filed Weights   05/18/19 0500 05/19/19 0352 05/20/19 0403  Weight: 133.8 kg 121.2 kg 134.5 kg   Body mass index is 40.22 kg/m.   Physical Exam: General: Obese built,  not in obvious distress, weak voice.  Cortrak tube in place HENT: Normocephalic, pupils reactive.  No scleral pallor or icterus noted. Oral mucosa is moist.  Chest:  Clear breath sounds.   No crackles or wheezes.  CVS: S1 &S2 heard. No murmur.  Regular rate and rhythm.   tachycardic Abdomen: Soft , obese.  Nonspecific epigastric/upper abdominal tenderness noted.  Bowel sounds are heard.   Extremities: No cyanosis, clubbing or edema.  Peripheral pulses are palpable. Psych: Alert, awake and communicative, weak voice,  CNS:  No cranial nerve deficits.  Generalized weakness noted in all extremities. Skin: Warm and dry.  No rashes noted.  Data Review: I have personally reviewed the following laboratory data and studies,  CBC: Recent Labs  Lab 05/15/19 0647 05/16/19 0222 05/17/19 0242 05/18/19 0255 05/19/19 0224  WBC 7.2 8.0 8.9 9.5 8.7  HGB 9.8* 9.8* 10.1* 9.9* 10.1*  HCT 29.3* 29.9* 30.9* 30.5* 31.2*  MCV 73.6* 74.9* 75.0* 75.3* 75.7*  PLT 178 173 172 173 161   Basic Metabolic Panel: Recent Labs  Lab 05/15/19 0647 05/15/19 0647 05/15/19 1747 05/16/19 0222 05/17/19 0242 05/18/19 0255 05/19/19 0224  NA 133*   < > 131* 132* 138 140 139  K 3.3*   < > 4.3 3.6 3.8 4.1 4.2  CL 104   < > 102 102 104 105 103  CO2 22   < > 20* 22 24 25 27   GLUCOSE 199*   < > 382* 339* 332* 196* 191*  BUN 16   < > 14 13 15 14 16   CREATININE 0.74   < > 0.99 0.84 0.83 0.77 0.81  CALCIUM 9.8   < > 9.9 9.7 9.8 9.3 9.0  MG 1.9  --   --  1.9 1.6* 1.9 1.7  PHOS 1.3*  --   --  1.3* 1.6* 2.9 2.1*   < > = values in this interval not displayed.   Liver Function Tests: Recent Labs  Lab 05/15/19 0647 05/16/19 0222 05/17/19 0242 05/18/19 0255 05/19/19 0224  AST 11* 11* 14* 15 22  ALT 14 14 12 12 15   ALKPHOS 74 83 90 88 96  BILITOT 0.4 0.3 0.5 0.2* 0.4  PROT 5.7* 6.1* 6.6 6.7 6.8  ALBUMIN 1.4* 1.5* 1.6* 1.5* 1.5*   Recent Labs  Lab 05/16/19 0222  LIPASE 26   No results for input(s): AMMONIA in  the last 168 hours. Cardiac Enzymes: No results for input(s): CKTOTAL, CKMB, CKMBINDEX, TROPONINI in the last 168 hours. BNP (last 3 results) Recent Labs    04/23/19 0450  BNP 99.2    ProBNP (last 3 results) No results for input(s): PROBNP in the last 8760 hours.  CBG: Recent Labs  Lab 05/19/19 1640 05/19/19 1922 05/20/19 0011 05/20/19 0402 05/20/19 0737  GLUCAP 273* 233* 248* 165* 106*   Recent Results (from the past 240 hour(s))  SARS CORONAVIRUS 2 (TAT 6-24 HRS) Nasopharyngeal Nasopharyngeal Swab     Status: None  Collection Time: 05/12/19  2:36 PM   Specimen: Nasopharyngeal Swab  Result Value Ref Range Status   SARS Coronavirus 2 NEGATIVE NEGATIVE Final    Comment: (NOTE) SARS-CoV-2 target nucleic acids are NOT DETECTED. The SARS-CoV-2 RNA is generally detectable in upper and lower respiratory specimens during the acute phase of infection. Negative results do not preclude SARS-CoV-2 infection, do not rule out co-infections with other pathogens, and should not be used as the sole basis for treatment or other patient management decisions. Negative results must be combined with clinical observations, patient history, and epidemiological information. The expected result is Negative. Fact Sheet for Patients: HairSlick.no Fact Sheet for Healthcare Providers: quierodirigir.com This test is not yet approved or cleared by the [place] FDA and  has been authorized for detection and/or diagnosis of SARS-CoV-2 by FDA under an Emergency Use Authorization (EUA). This EUA will remain  in effect (meaning this test can be used) for the duration of the COVID-19 declaration under Section 56 4(b)(1) of the Act, 21 U.S.C. section 360bbb-3(b)(1), unless the authorization is terminated or revoked sooner. Performed at [facility], [address] [place], [place] [address]   MRSA PCR Screening     Status: None    Collection Time: 05/13/19  7:29 PM   Specimen: Nasal Mucosa; Nasopharyngeal  Result Value Ref Range Status   MRSA by PCR NEGATIVE NEGATIVE Final    Comment:        The GeneXpert MRSA Assay (FDA approved for NASAL specimens only), is one component of a comprehensive MRSA colonization surveillance program. It is not intended to diagnose MRSA infection nor to guide or monitor treatment for MRSA infections. Performed at [facility], [address], [place], [place] [address]      Studies: No results found.  Scheduled Meds: . allopurinol  300 mg Oral Daily  . chlorhexidine  15 mL Mouth Rinse BID  . enoxaparin (LOVENOX) injection  40 mg Subcutaneous Q24H  . feeding supplement (PRO-STAT SUGAR FREE 64)  30 mL Per Tube TID  . insulin aspart  0-20 Units Subcutaneous Q4H  . insulin aspart  12 Units Subcutaneous Q4H  . insulin glargine  20 Units Subcutaneous QHS  . insulin glargine  45 Units Subcutaneous Daily  . mouth rinse  15 mL Mouth Rinse q12n4p  . pantoprazole (PROTONIX) IV  40 mg Intravenous Q12H    Continuous Infusions: . feeding supplement (VITAL 1.5 CAL) 60 mL/hr at 05/20/19 0300    Joycelyn Das, MD  [facility] 05/20/2019

## 2019-05-20 NOTE — Plan of Care (Signed)
  Problem: Education: Goal: Ability to describe self-care measures that may prevent or decrease complications (Diabetes Survival Skills Education) will improve Outcome: Progressing Goal: Individualized Educational Video(s) Outcome: Progressing   Problem: Cardiac: Goal: Ability to maintain an adequate cardiac output will improve Outcome: Progressing   Problem: Health Behavior/Discharge Planning: Goal: Ability to identify and utilize available resources and services will improve Outcome: Progressing Goal: Ability to manage health-related needs will improve Outcome: Progressing   Problem: Fluid Volume: Goal: Ability to achieve a balanced intake and output will improve Outcome: Progressing   Problem: Metabolic: Goal: Ability to maintain appropriate glucose levels will improve Outcome: Progressing   Problem: Nutritional: Goal: Maintenance of adequate nutrition will improve Outcome: Progressing Goal: Maintenance of adequate weight for body size and type will improve Outcome: Progressing   Problem: Respiratory: Goal: Will regain and/or maintain adequate ventilation Outcome: Progressing   Problem: Urinary Elimination: Goal: Ability to achieve and maintain adequate renal perfusion and functioning will improve Outcome: Progressing   Problem: Education: Goal: Knowledge of General Education information will improve Description: Including pain rating scale, medication(s)/side effects and non-pharmacologic comfort measures Outcome: Progressing   Problem: Health Behavior/Discharge Planning: Goal: Ability to manage health-related needs will improve Outcome: Progressing   Problem: Clinical Measurements: Goal: Ability to maintain clinical measurements within normal limits will improve Outcome: Progressing Goal: Will remain free from infection Outcome: Progressing Goal: Diagnostic test results will improve Outcome: Progressing Goal: Respiratory complications will improve Outcome:  Progressing Goal: Cardiovascular complication will be avoided Outcome: Progressing   Problem: Activity: Goal: Risk for activity intolerance will decrease Outcome: Progressing   Problem: Nutrition: Goal: Adequate nutrition will be maintained Outcome: Progressing   Problem: Coping: Goal: Level of anxiety will decrease Outcome: Progressing   Problem: Elimination: Goal: Will not experience complications related to bowel motility Outcome: Progressing Goal: Will not experience complications related to urinary retention Outcome: Progressing   Problem: Pain Managment: Goal: General experience of comfort will improve Outcome: Progressing   Problem: Safety: Goal: Ability to remain free from injury will improve Outcome: Progressing   Problem: Skin Integrity: Goal: Risk for impaired skin integrity will decrease Outcome: Progressing   

## 2019-05-20 NOTE — Progress Notes (Signed)
Subjective: No new complaints.  He feels that he is not obtaining enough pain medications overnight.  Objective: Vital signs in last 24 hours: Temp:  [97.6 F (36.4 C)-98.5 F (36.9 C)] 98.1 F (36.7 C) (02/03 0702) Pulse Rate:  [66-128] 78 (02/03 0403) Resp:  [17-32] 24 (02/03 0702) BP: (112-149)/(67-90) 149/77 (02/03 0702) SpO2:  [97 %-100 %] 98 % (02/03 0403) Weight:  [134.5 kg] 134.5 kg (02/03 0403) Last BM Date: 05/18/19  Intake/Output from previous day: 02/02 0701 - 02/03 0700 In: 3499 [P.O.:120; NG/GT:3379] Out: 200 [Urine:200] Intake/Output this shift: No intake/output data recorded.  General appearance: alert, no distress and fatigued Resp: clear to auscultation bilaterally Cardio: regular rate and rhythm GI: soft, non-tender; bowel sounds normal; no masses,  no organomegaly Extremities: extremities normal, atraumatic, no cyanosis or edema  Lab Results: Recent Labs    05/18/19 0255 05/19/19 0224  WBC 9.5 8.7  HGB 9.9* 10.1*  HCT 30.5* 31.2*  PLT 173 161   BMET Recent Labs    05/18/19 0255 05/19/19 0224  NA 140 139  K 4.1 4.2  CL 105 103  CO2 25 27  GLUCOSE 196* 191*  BUN 14 16  CREATININE 0.77 0.81  CALCIUM 9.3 9.0   LFT Recent Labs    05/19/19 0224  PROT 6.8  ALBUMIN 1.5*  AST 22  ALT 15  ALKPHOS 96  BILITOT 0.4   PT/INR No results for input(s): LABPROT, INR in the last 72 hours. Hepatitis Panel No results for input(s): HEPBSAG, HCVAB, HEPAIGM, HEPBIGM in the last 72 hours. C-Diff No results for input(s): CDIFFTOX in the last 72 hours. Fecal Lactopherrin No results for input(s): FECLLACTOFRN in the last 72 hours.  Studies/Results: No results found.  Medications:  Scheduled: . allopurinol  300 mg Oral Daily  . chlorhexidine  15 mL Mouth Rinse BID  . enoxaparin (LOVENOX) injection  40 mg Subcutaneous Q24H  . feeding supplement (PRO-STAT SUGAR FREE 64)  30 mL Per Tube TID  . insulin aspart  0-20 Units Subcutaneous Q4H  .  insulin aspart  12 Units Subcutaneous Q4H  . insulin glargine  20 Units Subcutaneous QHS  . insulin glargine  45 Units Subcutaneous Daily  . mouth rinse  15 mL Mouth Rinse q12n4p  . pantoprazole (PROTONIX) IV  40 mg Intravenous Q12H   Continuous: . feeding supplement (VITAL 1.5 CAL) 60 mL/hr at 05/20/19 0300    Assessment/Plan: 1) Severe acute pancreatitis. 2) Severe malnutrition. 3) DM.   Over the past 12 hours he only used 3 mg of Dilaudid.  I reminded him that he can use Dilaudid every 3 hours, if necessary.  He felt that he had better control with the PCA, however, he was explained that he suffered with confusion.  Caleb Chambers understands the situation.  There is no evidence of fever and his blood work is pending for this AM.  There is no evidence of any clinical decline.  Currently, the patient does not have an appetite, which is not a problem.  He is receiving adequate nutrition with the tube feeds.  From my standpoint, there is no rush to push him for PO intake at this time.  Plan: 1) Continue with tube feeding. 2) Continue with pain control.  LOS: 8 days   Caleb Chambers D 05/20/2019, 7:54 AM

## 2019-05-20 NOTE — Progress Notes (Signed)
Occupational Therapy Treatment Patient Details Name: Caleb Chambers MRN: [id number] DOB: 04-Nov-1965 Today's Date: 05/20/2019    History of present illness Pt is a 54 year old man admitted 05/12/19 with N/V, abdominal pain and DKA. Pt discharged from [facility] on 04/29/19 with DKA and pancreatitis. He has not been able to eat much since his discharge. PMH: morbid obesity, HTN, gout, Crohn's disease, IDDM.    OT comments  Pt without complaints of abdominal pain with distraction of ambulation. Min assist for UB dressing, mod for LB. Pt pleased to be able to walk in hall with RW, min assist and chair following for safety. HR up to 141 with activity. Continues to demonstrate slow processing and increased time to follow commands.  Follow Up Recommendations  CIR    Equipment Recommendations  3 in 1 bedside commode    Recommendations for Other Services      Precautions / Restrictions Precautions Precautions: Fall Precaution Comments: watch HR, Cortrak       Mobility Bed Mobility Overal bed mobility: Needs Assistance Bed Mobility: Supine to Sit     Supine to sit: Min guard     General bed mobility comments: no physical assist, HOB up, assist for lines  Transfers Overall transfer level: Needs assistance Equipment used: Rolling walker (2 wheeled) Transfers: Sit to/from Stand Sit to Stand: Min guard         General transfer comment: cues for hand placement, stood from elevated bed and from chair    Balance Overall balance assessment: Needs assistance   Sitting balance-Leahy Scale: Fair Sitting balance - Comments: no LOB with donning R sock   Standing balance support: During functional activity Standing balance-Leahy Scale: Poor Standing balance comment: Requires RW and min assist                           ADL either performed or assessed with clinical judgement   ADL Overall ADL's : Needs assistance/impaired Eating/Feeding: Set up;Sitting Eating/Feeding Details  (indicate cue type and reason): drinking water             Upper Body Dressing : Minimal assistance;Sitting Upper Body Dressing Details (indicate cue type and reason): changed soiled gown, front opening gown Lower Body Dressing: Moderate assistance;Sitting/lateral leans Lower Body Dressing Details (indicate cue type and reason): pt donned R sock, OT donned L             Functional mobility during ADLs: Minimal assistance;+2 for safety/equipment;Rolling walker;Cueing for safety General ADL Comments: pt with HR to 141 with ambulation     Vision       Perception     Praxis      Cognition Arousal/Alertness: Awake/alert Behavior During Therapy: WFL for tasks assessed/performed Overall Cognitive Status: Impaired/Different from baseline Area of Impairment: Following commands;Problem solving                       Following Commands: Follows one step commands with increased time;Follows one step commands inconsistently     Problem Solving: Slow processing;Decreased initiation;Difficulty sequencing;Requires verbal cues;Requires tactile cues          Exercises     Shoulder Instructions       General Comments      Pertinent Vitals/ Pain       Pain Assessment: No/denies pain  Home Living  Prior Functioning/Environment              Frequency  Min 2X/week        Progress Toward Goals  OT Goals(current goals can now be found in the care plan section)  Progress towards OT goals: Progressing toward goals  Acute Rehab OT Goals Patient Stated Goal: feel better OT Goal Formulation: With patient Time For Goal Achievement: 05/27/19 Potential to Achieve Goals: Good  Plan Discharge plan remains appropriate    Co-evaluation          OT goals addressed during session: ADL's and self-care;Proper use of Adaptive equipment and DME      AM-PAC OT "6 Clicks" Daily Activity     Outcome  Measure   Help from another person eating meals?: None Help from another person taking care of personal grooming?: A Little Help from another person toileting, which includes using toliet, bedpan, or urinal?: A Lot Help from another person bathing (including washing, rinsing, drying)?: A Lot Help from another person to put on and taking off regular upper body clothing?: A Little Help from another person to put on and taking off regular lower body clothing?: A Lot 6 Click Score: 16    End of Session Equipment Utilized During Treatment: Gait belt;Rolling walker  OT Visit Diagnosis: Unsteadiness on feet (R26.81);Pain;Muscle weakness (generalized) (M62.81)   Activity Tolerance Patient tolerated treatment well   Patient Left in chair;with call bell/phone within reach   Nurse Communication          Time: 5056-9794 OT Time Calculation (min): 30 min  Charges: OT General Charges $OT Visit: 1 Visit OT Treatments $Self Care/Home Management : 8-22 mins  [name], OTR/L Acute Rehabilitation Services Pager: [phone] Office: [phone]   [name] 05/20/2019, 2:29 PM

## 2019-05-21 LAB — CBC WITH DIFFERENTIAL/PLATELET
Abs Immature Granulocytes: 0.2 10*3/uL — ABNORMAL HIGH (ref 0.00–0.07)
Basophils Absolute: 0 10*3/uL (ref 0.0–0.1)
Basophils Relative: 0 %
Eosinophils Absolute: 0.1 10*3/uL (ref 0.0–0.5)
Eosinophils Relative: 1 %
HCT: 29.5 % — ABNORMAL LOW (ref 39.0–52.0)
Hemoglobin: 9.3 g/dL — ABNORMAL LOW (ref 13.0–17.0)
Lymphocytes Relative: 15 %
Lymphs Abs: 1.2 10*3/uL (ref 0.7–4.0)
MCH: 24.2 pg — ABNORMAL LOW (ref 26.0–34.0)
MCHC: 31.5 g/dL (ref 30.0–36.0)
MCV: 76.6 fL — ABNORMAL LOW (ref 80.0–100.0)
Metamyelocytes Relative: 3 %
Monocytes Absolute: 0.4 10*3/uL (ref 0.1–1.0)
Monocytes Relative: 5 %
Neutro Abs: 6.2 10*3/uL (ref 1.7–7.7)
Neutrophils Relative %: 76 %
Platelets: 210 10*3/uL (ref 150–400)
RBC: 3.85 MIL/uL — ABNORMAL LOW (ref 4.22–5.81)
RDW: 16 % — ABNORMAL HIGH (ref 11.5–15.5)
WBC: 8.1 10*3/uL (ref 4.0–10.5)
nRBC: 0.9 % — ABNORMAL HIGH (ref 0.0–0.2)
nRBC: 2 /100 WBC — ABNORMAL HIGH

## 2019-05-21 LAB — COMPREHENSIVE METABOLIC PANEL
ALT: 23 U/L (ref 0–44)
AST: 38 U/L (ref 15–41)
Albumin: 1.4 g/dL — ABNORMAL LOW (ref 3.5–5.0)
Alkaline Phosphatase: 113 U/L (ref 38–126)
Anion gap: 11 (ref 5–15)
BUN: 19 mg/dL (ref 6–20)
CO2: 25 mmol/L (ref 22–32)
Calcium: 8.3 mg/dL — ABNORMAL LOW (ref 8.9–10.3)
Chloride: 102 mmol/L (ref 98–111)
Creatinine, Ser: 0.83 mg/dL (ref 0.61–1.24)
GFR calc Af Amer: >60 mL/min (ref >60)
GFR calc non Af Amer: >60 mL/min (ref >60)
Glucose, Bld: 127 mg/dL — ABNORMAL HIGH (ref 70–99)
Potassium: 4.3 mmol/L (ref 3.5–5.1)
Sodium: 138 mmol/L (ref 135–145)
Total Bilirubin: 1.1 mg/dL (ref 0.3–1.2)
Total Protein: 6.7 g/dL (ref 6.5–8.1)

## 2019-05-21 LAB — GLUCOSE, CAPILLARY
Glucose-Capillary: 108 mg/dL — ABNORMAL HIGH (ref 70–99)
Glucose-Capillary: 137 mg/dL — ABNORMAL HIGH (ref 70–99)
Glucose-Capillary: 229 mg/dL — ABNORMAL HIGH (ref 70–99)
Glucose-Capillary: 236 mg/dL — ABNORMAL HIGH (ref 70–99)
Glucose-Capillary: 84 mg/dL (ref 70–99)

## 2019-05-21 LAB — PHOSPHORUS: Phosphorus: 3 mg/dL (ref 2.5–4.6)

## 2019-05-21 LAB — MAGNESIUM: Magnesium: 1.8 mg/dL (ref 1.7–2.4)

## 2019-05-21 MED ORDER — DILTIAZEM HCL 25 MG/5ML IV SOLN
10.0000 mg | Freq: Four times a day (QID) | INTRAVENOUS | Status: DC | PRN
  Administered 2019-05-21 – 2019-05-24 (×7): 10 mg via INTRAVENOUS
  Filled 2019-05-21 (×10): qty 5

## 2019-05-21 MED ORDER — HYDROMORPHONE HCL 1 MG/ML IJ SOLN
0.5000 mg | INTRAMUSCULAR | Status: DC | PRN
  Administered 2019-05-21 – 2019-06-14 (×19): 0.5 mg via INTRAVENOUS
  Filled 2019-05-21 (×18): qty 0.5

## 2019-05-21 MED ORDER — INSULIN ASPART 100 UNIT/ML ~~LOC~~ SOLN
12.0000 [IU] | Freq: Four times a day (QID) | SUBCUTANEOUS | Status: DC
  Administered 2019-05-21 – 2019-05-25 (×15): 12 [IU] via SUBCUTANEOUS

## 2019-05-21 MED ORDER — INSULIN ASPART 100 UNIT/ML ~~LOC~~ SOLN
0.0000 [IU] | Freq: Four times a day (QID) | SUBCUTANEOUS | Status: DC
  Administered 2019-05-21: 7 [IU] via SUBCUTANEOUS
  Administered 2019-05-22 (×3): 3 [IU] via SUBCUTANEOUS
  Administered 2019-05-22: 03:00:00 4 [IU] via SUBCUTANEOUS
  Administered 2019-05-23: 7 [IU] via SUBCUTANEOUS
  Administered 2019-05-23: 3 [IU] via SUBCUTANEOUS
  Administered 2019-05-24: 4 [IU] via SUBCUTANEOUS
  Administered 2019-05-24: 3 [IU] via SUBCUTANEOUS
  Administered 2019-05-24: 4 [IU] via SUBCUTANEOUS
  Administered 2019-05-24: 3 [IU] via SUBCUTANEOUS
  Administered 2019-05-25 – 2019-05-26 (×5): 4 [IU] via SUBCUTANEOUS
  Administered 2019-05-26 – 2019-05-27 (×2): 7 [IU] via SUBCUTANEOUS
  Administered 2019-05-27 – 2019-05-28 (×3): 4 [IU] via SUBCUTANEOUS
  Administered 2019-05-28: 7 [IU] via SUBCUTANEOUS

## 2019-05-21 NOTE — Progress Notes (Signed)
Inpatient Rehabilitation Admissions Coordinator  I met with patient at bedside . He states he is not having a good day today. I continue to follow his progress. Not at a level to begin insurance authorization with [facility] for a possible inpt rehab admit. I will follow up early next week.  Danne Baxter, RN, MSN Rehab Admissions Coordinator [phone] 05/21/2019 1:28 PM

## 2019-05-21 NOTE — Plan of Care (Signed)
  Problem: Pain Managment: Goal: General experience of comfort will improve Outcome: Progressing   Problem: Safety: Goal: Ability to remain free from injury will improve Outcome: Progressing   Problem: Skin Integrity: Goal: Risk for impaired skin integrity will decrease Outcome: Progressing   

## 2019-05-21 NOTE — Progress Notes (Signed)
Subjective: He slept very well this past evening.  Objective: Vital signs in last 24 hours: Temp:  [97.7 F (36.5 C)-98.5 F (36.9 C)] 98 F (36.7 C) (02/04 0356) Pulse Rate:  [109-120] 110 (02/04 0356) Resp:  [18-30] 23 (02/04 0500) BP: (105-149)/(57-79) 144/79 (02/04 0356) SpO2:  [92 %-99 %] 99 % (02/04 0356) Weight:  [134.6 kg] 134.6 kg (02/04 0356) Last BM Date: 05/19/19  Intake/Output from previous day: 02/03 0701 - 02/04 0700 In: 1079 [I.V.:9; NG/GT:1020; IV Piggyback:50] Out: 750 [Urine:750] Intake/Output this shift: Total I/O In: 120 [NG/GT:120] Out: 750 [Urine:750]  General appearance: alert and no distress GI: soft, non-tender; bowel sounds normal; no masses,  no organomegaly  Lab Results: Recent Labs    05/19/19 0224 05/20/19 0747 05/21/19 0223  WBC 8.7 8.2 8.1  HGB 10.1* 9.7* 9.3*  HCT 31.2* 30.4* 29.5*  PLT 161 186 210   BMET Recent Labs    05/19/19 0224 05/20/19 0747 05/21/19 0223  NA 139 144 138  K 4.2 4.1 4.3  CL 103 101 102  CO2 27 27 25   GLUCOSE 191* 114* 127*  BUN 16 15 19   CREATININE 0.81 0.71 0.83  CALCIUM 9.0 9.1 8.3*   LFT Recent Labs    05/21/19 0223  PROT 6.7  ALBUMIN 1.4*  AST 38  ALT 23  ALKPHOS 113  BILITOT 1.1   PT/INR No results for input(s): LABPROT, INR in the last 72 hours. Hepatitis Panel No results for input(s): HEPBSAG, HCVAB, HEPAIGM, HEPBIGM in the last 72 hours. C-Diff No results for input(s): CDIFFTOX in the last 72 hours. Fecal Lactopherrin No results for input(s): FECLLACTOFRN in the last 72 hours.  Studies/Results: No results found.  Medications:  Scheduled: . allopurinol  300 mg Oral Daily  . chlorhexidine  15 mL Mouth Rinse BID  . enoxaparin (LOVENOX) injection  40 mg Subcutaneous Q24H  . feeding supplement (PRO-STAT SUGAR FREE 64)  30 mL Per Tube TID  . insulin aspart  0-20 Units Subcutaneous Q4H  . insulin aspart  12 Units Subcutaneous Q4H  . insulin glargine  20 Units Subcutaneous QHS   . insulin glargine  45 Units Subcutaneous Daily  . mouth rinse  15 mL Mouth Rinse q12n4p  . pantoprazole (PROTONIX) IV  40 mg Intravenous Q12H   Continuous: . feeding supplement (VITAL 1.5 CAL) 1,000 mL (05/21/19 07/19/19)    Assessment/Plan: 1) Severe acute pancreatitis. 2) Severe malnutrition.   He used Dilaudid three times the past 12-15 hours.  He was able to sleep much better this past evening.  The patient continues to progress.  His nutrition is still very poor, but he is being supported with tube feeding.  Currently he does not have an appetite, but probable sometime early next week he can be SLOWLY tried on PO.  Plan: 1) Continue tube feeding. 2) Continue pain control.  LOS: 9 days   Auda Finfrock D 05/21/2019, 6:40 AM

## 2019-05-21 NOTE — Progress Notes (Signed)
Pt c/o pain and numbness to his toes, started about 2 days ago.  Pt to advise his MD in the AM

## 2019-05-21 NOTE — Plan of Care (Signed)
  Problem: Education: Goal: Ability to describe self-care measures that may prevent or decrease complications (Diabetes Survival Skills Education) will improve Outcome: Progressing Goal: Individualized Educational Video(s) Outcome: Progressing   Problem: Cardiac: Goal: Ability to maintain an adequate cardiac output will improve Outcome: Progressing   Problem: Health Behavior/Discharge Planning: Goal: Ability to identify and utilize available resources and services will improve Outcome: Progressing Goal: Ability to manage health-related needs will improve Outcome: Progressing   Problem: Fluid Volume: Goal: Ability to achieve a balanced intake and output will improve Outcome: Progressing   Problem: Metabolic: Goal: Ability to maintain appropriate glucose levels will improve Outcome: Progressing   Problem: Nutritional: Goal: Maintenance of adequate nutrition will improve Outcome: Progressing Goal: Maintenance of adequate weight for body size and type will improve Outcome: Progressing   Problem: Respiratory: Goal: Will regain and/or maintain adequate ventilation Outcome: Progressing   Problem: Urinary Elimination: Goal: Ability to achieve and maintain adequate renal perfusion and functioning will improve Outcome: Progressing   Problem: Education: Goal: Knowledge of General Education information will improve Description: Including pain rating scale, medication(s)/side effects and non-pharmacologic comfort measures Outcome: Progressing   Problem: Health Behavior/Discharge Planning: Goal: Ability to manage health-related needs will improve Outcome: Progressing   Problem: Clinical Measurements: Goal: Ability to maintain clinical measurements within normal limits will improve Outcome: Progressing Goal: Will remain free from infection Outcome: Progressing Goal: Diagnostic test results will improve Outcome: Progressing Goal: Respiratory complications will improve Outcome:  Progressing Goal: Cardiovascular complication will be avoided Outcome: Progressing   Problem: Activity: Goal: Risk for activity intolerance will decrease Outcome: Progressing   Problem: Nutrition: Goal: Adequate nutrition will be maintained Outcome: Progressing   Problem: Elimination: Goal: Will not experience complications related to bowel motility Outcome: Progressing Goal: Will not experience complications related to urinary retention Outcome: Progressing   Problem: Coping: Goal: Level of anxiety will decrease Outcome: Progressing   Problem: Pain Managment: Goal: General experience of comfort will improve Outcome: Progressing   Problem: Safety: Goal: Ability to remain free from injury will improve Outcome: Progressing   Problem: Skin Integrity: Goal: Risk for impaired skin integrity will decrease Outcome: Progressing

## 2019-05-21 NOTE — Progress Notes (Signed)
PT Cancellation Note  Patient Details Name: MARQUIN PATINO MRN: [id number] DOB: [birth year]   Cancelled Treatment:    Reason Eval/Treat Not Completed: Patient declined, no reason specified Patient declined X2 due to pain. PT will continue to follow acutely.   Erline Levine, PTA Acute Rehabilitation Services Pager: [phone] Office: [phone]   05/21/2019, 2:55 PM

## 2019-05-21 NOTE — Plan of Care (Signed)
  Problem: Education: Goal: Ability to describe self-care measures that may prevent or decrease complications (Diabetes Survival Skills Education) will improve Outcome: Progressing Goal: Individualized Educational Video(s) Outcome: Progressing   Problem: Cardiac: Goal: Ability to maintain an adequate cardiac output will improve Outcome: Progressing   Problem: Health Behavior/Discharge Planning: Goal: Ability to identify and utilize available resources and services will improve Outcome: Progressing Goal: Ability to manage health-related needs will improve Outcome: Progressing   Problem: Fluid Volume: Goal: Ability to achieve a balanced intake and output will improve Outcome: Progressing   Problem: Metabolic: Goal: Ability to maintain appropriate glucose levels will improve Outcome: Progressing   Problem: Nutritional: Goal: Maintenance of adequate nutrition will improve Outcome: Progressing Goal: Maintenance of adequate weight for body size and type will improve Outcome: Progressing   Problem: Respiratory: Goal: Will regain and/or maintain adequate ventilation Outcome: Progressing   Problem: Urinary Elimination: Goal: Ability to achieve and maintain adequate renal perfusion and functioning will improve Outcome: Progressing   Problem: Education: Goal: Knowledge of General Education information will improve Description: Including pain rating scale, medication(s)/side effects and non-pharmacologic comfort measures Outcome: Progressing   Problem: Health Behavior/Discharge Planning: Goal: Ability to manage health-related needs will improve Outcome: Progressing   Problem: Clinical Measurements: Goal: Ability to maintain clinical measurements within normal limits will improve Outcome: Progressing Goal: Will remain free from infection Outcome: Progressing Goal: Diagnostic test results will improve Outcome: Progressing Goal: Respiratory complications will improve Outcome:  Progressing Goal: Cardiovascular complication will be avoided Outcome: Progressing   Problem: Activity: Goal: Risk for activity intolerance will decrease Outcome: Progressing   Problem: Nutrition: Goal: Adequate nutrition will be maintained Outcome: Progressing   Problem: Coping: Goal: Level of anxiety will decrease Outcome: Progressing   Problem: Elimination: Goal: Will not experience complications related to bowel motility Outcome: Progressing Goal: Will not experience complications related to urinary retention Outcome: Progressing   Problem: Pain Managment: Goal: General experience of comfort will improve Outcome: Progressing   Problem: Safety: Goal: Ability to remain free from injury will improve Outcome: Progressing   Problem: Skin Integrity: Goal: Risk for impaired skin integrity will decrease Outcome: Progressing   

## 2019-05-21 NOTE — Progress Notes (Signed)
PROGRESS NOTE  DOMONIC KIMBALL ION:[id number] DOB: November 15, 1965 DOA: 05/12/2019 PCP: Patient, No Pcp Per   LOS: 9 days   Brief narrative: As per HPI,  WESTLY HINNANT is a 54 y.o. male with medical history significant of HTN, DM, gout; and recent admission for pancreatitis and DKA  presented to the hospital with complaints of minimal oral intake since his discharge.  Patient reported shortness of breath and decreased ability to eat and drink.  He has not been fully compliant with insulin regimen as well.  Patient was then admitted to the hospital again for DKA and poor oral intolerance.  Assessment/Plan:  Principal Problem:   DKA (diabetic ketoacidosis) (HCC) Active Problems:   Acute pancreatitis   Acute kidney injury (HCC)   Morbid obesity (HCC)   Debility   Protein-calorie malnutrition, severe  Recurrent DKA Resolved.  Hemoglobin A1c was 13.2 on 1/6  on tube feeding. Currently on basal bolus and subcu insulin. On Lantus to 45 units in am and 20 units at night,and short acting insulin  12 units q4h with resistant scale insulin.  Blood glucose levels are slightly on the lower side.  Will decrease the frequency of sliding scale in NovoLog to every 6 hourly today.  Will closely monitor.  Last POC glucose of 84.   severe pancreatitis.. Continue NPO.  Nausea and vomiting has improved.  Still complains of moderate amount of pain especially in the night-time with impaired appetite.  Still requiring IV Dilaudid. History of cholecystectomy in the past..  Continue IV Protonix.   CT scan of the abdomen and pelvis was performed which showed severe pancreatitis with large pancreatic fluid collection with dimensions of 17.2 x 17.4 x 16.6 cm. Status post IR guided feeding tube placement. No fever or leukocytosis.  Lipase of 26 on 05/16/2019.  Expecting conservative management and slow recovery at this time.  GI following the patient, will follow recommendation.   Mild sore throat.  Added lidocaine viscous.   Mild erythema noted.  Mild tachycardia, history of hypertension.  on IV Cardizem, increase Cardizem to 10 mg every 6 hours.  Hypokalemia Improved with replacement.  Check daily labs.  Hypomagnesemia.   Magnesium of 1.8 today.  Check magnesium levels in a.m.  Hypophosphatemia.  Replenished.  Phosphorus of 3.0 today  Acute kidney injury on presentation.  Likely secondary to acute pancreatitis and volume depletion. Resolved.  Essential hypertension Patient was on Zestoretic and Norvasc at home.  Will continue to hold for now.  On as needed IV Cardizem at this time.   Morbid obesity -BMI 43.4, now NPO.  Debility and deconditioning. PT recommend CIR  Severe protein calorie malnutrition present on admission.  Nutrition on board.  On tube feeding at this time.  Albumin of 1.5.  Does have poor appetite.  VTE Prophylaxis: Lovenox subq  Code Status:  Full code  Family Communication:  None today.  Disposition Plan: Patient is from home.  Patient has been seen by physical therapy who recommended CIR.  Likely disposition to CIR in 3 to 4 days. Barriers to discharge include improvement of severe pancreatitis, still on cortrak tube for feeding, needing IV Dilaudid for abdominal pain  Consultants:  GI  Interventional radiology  Procedures:  Cortrak feeding tube placement 05/14/2019  Antibiotics:  Anti-infectives (From admission, onward)   None     Subjective: Today, patient still complains of moderate abdominal pain.  No nausea, vomiting had bowel movement.  Mild sore throat.  Has been requiring IV Dilaudid for pain.  Denies any fever, chills or rigor.  Denies shortness of breath cough  Objective: Vitals:   05/21/19 0424 05/21/19 0500  BP:    Pulse:    Resp: (!) 26 (!) 23  Temp:    SpO2:      Intake/Output Summary (Last 24 hours) at 05/21/2019 1056 Last data filed at 05/21/2019 0500 Gross per 24 hour  Intake 1079 ml  Output 750 ml  Net 329 ml   Filed Weights    05/19/19 0352 05/20/19 0403 05/21/19 0356  Weight: 121.2 kg 134.5 kg 134.6 kg   Body mass index is 40.25 kg/m.   Physical Exam:  General: Obese built, not in obvious distress, weak voice.  Cortrak tube in place HENT: Normocephalic, pupils reactive.  No scleral pallor or icterus noted. Oral mucosa is dry with mild erythema of the posterior pharynx. Chest:  Clear breath sounds.   No crackles or wheezes.  CVS: S1 &S2 heard. No murmur.  Regular rate and rhythm.   tachycardic Abdomen: Soft , obese.  Mild nonspecific tenderness noted.   Bowel sounds are heard.   Extremities: No cyanosis, clubbing or edema.  Peripheral pulses are palpable. Psych: Alert, awake and communicative, weak voice,  CNS:  No cranial nerve deficits.  Generalized weakness noted in all extremities. Skin: Warm and dry.  No rashes noted.  Data Review: I have personally reviewed the following laboratory data and studies,  CBC: Recent Labs  Lab 05/17/19 0242 05/18/19 0255 05/19/19 0224 05/20/19 0747 05/21/19 0223  WBC 8.9 9.5 8.7 8.2 8.1  NEUTROABS  --   --   --  6.1 6.2  HGB 10.1* 9.9* 10.1* 9.7* 9.3*  HCT 30.9* 30.5* 31.2* 30.4* 29.5*  MCV 75.0* 75.3* 75.7* 76.2* 76.6*  PLT 172 173 161 186 502   Basic Metabolic Panel: Recent Labs  Lab 05/16/19 0222 05/16/19 0222 05/17/19 0242 05/18/19 0255 05/19/19 0224 05/20/19 0747 05/21/19 0223  NA 132*   < > 138 140 139 144 138  K 3.6   < > 3.8 4.1 4.2 4.1 4.3  CL 102   < > 104 105 103 101 102  CO2 22   < > 24 25 27 27 25   GLUCOSE 339*   < > 332* 196* 191* 114* 127*  BUN 13   < > 15 14 16 15 19   CREATININE 0.84   < > 0.83 0.77 0.81 0.71 0.83  CALCIUM 9.7   < > 9.8 9.3 9.0 9.1 8.3*  MG 1.9   < > 1.6* 1.9 1.7 1.5* 1.8  PHOS 1.3*  --  1.6* 2.9 2.1*  --  3.0   < > = values in this interval not displayed.   Liver Function Tests: Recent Labs  Lab 05/17/19 0242 05/18/19 0255 05/19/19 0224 05/20/19 0747 05/21/19 0223  AST 14* 15 22 29  38  ALT 12 12 15 23 23     ALKPHOS 90 88 96 94 113  BILITOT 0.5 0.2* 0.4 0.6 1.1  PROT 6.6 6.7 6.8 6.9 6.7  ALBUMIN 1.6* 1.5* 1.5* 1.5* 1.4*   Recent Labs  Lab 05/16/19 0222  LIPASE 26   No results for input(s): AMMONIA in the last 168 hours. Cardiac Enzymes: No results for input(s): CKTOTAL, CKMB, CKMBINDEX, TROPONINI in the last 168 hours. BNP (last 3 results) Recent Labs    04/23/19 0450  BNP 99.2    ProBNP (last 3 results) No results for input(s): PROBNP in the last 8760 hours.  CBG: Recent Labs  Lab 05/20/19  1659 05/20/19 2023 05/20/19 2355 05/21/19 0404 05/21/19 0833  GLUCAP 149* 156* 137* 108* 84   Recent Results (from the past 240 hour(s))  SARS CORONAVIRUS 2 (TAT 6-24 HRS) Nasopharyngeal Nasopharyngeal Swab     Status: None   Collection Time: 05/12/19  2:36 PM   Specimen: Nasopharyngeal Swab  Result Value Ref Range Status   SARS Coronavirus 2 NEGATIVE NEGATIVE Final    Comment: (NOTE) SARS-CoV-2 target nucleic acids are NOT DETECTED. The SARS-CoV-2 RNA is generally detectable in upper and lower respiratory specimens during the acute phase of infection. Negative results do not preclude SARS-CoV-2 infection, do not rule out co-infections with other pathogens, and should not be used as the sole basis for treatment or other patient management decisions. Negative results must be combined with clinical observations, patient history, and epidemiological information. The expected result is Negative. Fact Sheet for Patients: HairSlick.no Fact Sheet for Healthcare Providers: quierodirigir.com This test is not yet approved or cleared by the [place] FDA and  has been authorized for detection and/or diagnosis of SARS-CoV-2 by FDA under an Emergency Use Authorization (EUA). This EUA will remain  in effect (meaning this test can be used) for the duration of the COVID-19 declaration under Section 56 4(b)(1) of the Act, 21  U.S.C. section 360bbb-3(b)(1), unless the authorization is terminated or revoked sooner. Performed at [facility], [address] [place], [place] [address]   MRSA PCR Screening     Status: None   Collection Time: 05/13/19  7:29 PM   Specimen: Nasal Mucosa; Nasopharyngeal  Result Value Ref Range Status   MRSA by PCR NEGATIVE NEGATIVE Final    Comment:        The GeneXpert MRSA Assay (FDA approved for NASAL specimens only), is one component of a comprehensive MRSA colonization surveillance program. It is not intended to diagnose MRSA infection nor to guide or monitor treatment for MRSA infections. Performed at [facility], [address] [place], [place] [address]      Studies: No results found.  Scheduled Meds: . allopurinol  300 mg Oral Daily  . chlorhexidine  15 mL Mouth Rinse BID  . enoxaparin (LOVENOX) injection  40 mg Subcutaneous Q24H  . feeding supplement (PRO-STAT SUGAR FREE 64)  30 mL Per Tube TID  . insulin aspart  0-20 Units Subcutaneous Q6H  . insulin aspart  12 Units Subcutaneous Q6H  . insulin glargine  20 Units Subcutaneous QHS  . insulin glargine  45 Units Subcutaneous Daily  . mouth rinse  15 mL Mouth Rinse q12n4p  . pantoprazole (PROTONIX) IV  40 mg Intravenous Q12H    Continuous Infusions: . feeding supplement (VITAL 1.5 CAL) 1,000 mL (05/21/19 2500)    Joycelyn Das, MD  [facility] 05/21/2019

## 2019-05-22 LAB — CBC WITH DIFFERENTIAL/PLATELET
Abs Immature Granulocytes: 0 10*3/uL (ref 0.00–0.07)
Band Neutrophils: 3 %
Basophils Absolute: 0 10*3/uL (ref 0.0–0.1)
Basophils Relative: 1 %
Eosinophils Absolute: 0 10*3/uL (ref 0.0–0.5)
Eosinophils Relative: 0 %
HCT: 28.3 % — ABNORMAL LOW (ref 39.0–52.0)
Hemoglobin: 8.9 g/dL — ABNORMAL LOW (ref 13.0–17.0)
Lymphocytes Relative: 15 %
Lymphs Abs: 0.7 10*3/uL (ref 0.7–4.0)
MCH: 23.9 pg — ABNORMAL LOW (ref 26.0–34.0)
MCHC: 31.4 g/dL (ref 30.0–36.0)
MCV: 76.1 fL — ABNORMAL LOW (ref 80.0–100.0)
Monocytes Absolute: 0.2 10*3/uL (ref 0.1–1.0)
Monocytes Relative: 4 %
Neutro Abs: 3.8 10*3/uL (ref 1.7–7.7)
Neutrophils Relative %: 77 %
Platelets: 245 10*3/uL (ref 150–400)
RBC: 3.72 MIL/uL — ABNORMAL LOW (ref 4.22–5.81)
RDW: 15.8 % — ABNORMAL HIGH (ref 11.5–15.5)
WBC: 4.8 10*3/uL (ref 4.0–10.5)
nRBC: 2 /100 WBC — ABNORMAL HIGH
nRBC: 2.3 % — ABNORMAL HIGH (ref 0.0–0.2)

## 2019-05-22 LAB — COMPREHENSIVE METABOLIC PANEL
ALT: 30 U/L (ref 0–44)
AST: 30 U/L (ref 15–41)
Albumin: 1.4 g/dL — ABNORMAL LOW (ref 3.5–5.0)
Alkaline Phosphatase: 121 U/L (ref 38–126)
Anion gap: 12 (ref 5–15)
BUN: 17 mg/dL (ref 6–20)
CO2: 26 mmol/L (ref 22–32)
Calcium: 8.4 mg/dL — ABNORMAL LOW (ref 8.9–10.3)
Chloride: 103 mmol/L (ref 98–111)
Creatinine, Ser: 0.89 mg/dL (ref 0.61–1.24)
GFR calc Af Amer: >60 mL/min (ref >60)
GFR calc non Af Amer: >60 mL/min (ref >60)
Glucose, Bld: 226 mg/dL — ABNORMAL HIGH (ref 70–99)
Potassium: 4.3 mmol/L (ref 3.5–5.1)
Sodium: 141 mmol/L (ref 135–145)
Total Bilirubin: 0.5 mg/dL (ref 0.3–1.2)
Total Protein: 7.3 g/dL (ref 6.5–8.1)

## 2019-05-22 LAB — MAGNESIUM: Magnesium: 1.7 mg/dL (ref 1.7–2.4)

## 2019-05-22 LAB — GLUCOSE, CAPILLARY
Glucose-Capillary: 192 mg/dL — ABNORMAL HIGH (ref 70–99)
Glucose-Capillary: 145 mg/dL — ABNORMAL HIGH (ref 70–99)
Glucose-Capillary: 144 mg/dL — ABNORMAL HIGH (ref 70–99)
Glucose-Capillary: 139 mg/dL — ABNORMAL HIGH (ref 70–99)

## 2019-05-22 MED ORDER — MORPHINE SULFATE ER 15 MG PO TBCR
15.0000 mg | EXTENDED_RELEASE_TABLET | Freq: Two times a day (BID) | ORAL | Status: DC
  Administered 2019-05-22 – 2019-05-26 (×9): 15 mg via ORAL
  Filled 2019-05-22 (×10): qty 1

## 2019-05-22 NOTE — Progress Notes (Signed)
Rapid Response was notified of patients change in heart rate which Kendal Hymen our monitor tech called me about twice while I was in patients room. Patient was OOB to [facility] for a BM and pain was an issue. Dilaudid was given for pain and patient went back to bed. Tube feeding continues as ordered and patient's HR returned to his normal rate of ST. HR 114.

## 2019-05-22 NOTE — Progress Notes (Signed)
Patient's heart rate went u[ due to patient getting OOB to [facility] for BM. Also, pain was a factor.After patient laid back down HR sustained at ST 110's.

## 2019-05-22 NOTE — Progress Notes (Signed)
Clear liquid diet served during lunch, tolerated 10 %. Took more on ice water.

## 2019-05-22 NOTE — Progress Notes (Signed)
Physical Therapy Treatment Patient Details Name: Caleb Chambers MRN: [id number] DOB: 1965-12-26 Today's Date: 05/22/2019    History of Present Illness Pt is a 54 year old man admitted 05/12/19 with N/V, abdominal pain and DKA. Pt discharged from [facility] on 04/29/19 with DKA and pancreatitis. He has not been able to eat much since his discharge. PMH: morbid obesity, HTN, gout, Crohn's disease, IDDM.     PT Comments    Patient continues to make progress toward PT goals. Continue to recommend CIR for further skilled PT services to maximize independence and safety with mobility.    Follow Up Recommendations  CIR;Supervision/Assistance - 24 hour     Equipment Recommendations  Rolling walker with 5" wheels    Recommendations for Other Services       Precautions / Restrictions Precautions Precautions: Fall Precaution Comments: watch HR, Cortrak    Mobility  Bed Mobility Overal bed mobility: Needs Assistance Bed Mobility: Supine to Sit     Supine to sit: Min guard     General bed mobility comments: increased time and needing brief rest break upon sitting at EOB  Transfers Overall transfer level: Needs assistance Equipment used: Rolling walker (2 wheeled) Transfers: Sit to/from Stand Sit to Stand: Min guard;Min assist         General transfer comment: cues for hand placement with carry over demonstrated during session; increased assist needed last trial due to posterior bias upon standing and pt using bilat LE to brace against chair  Ambulation/Gait Ambulation/Gait assistance: Min assist;+2 safety/equipment(chair follow) Gait Distance (Feet): (120 ft with 2 seated rest breaks ) Assistive device: Rolling walker (2 wheeled) Gait Pattern/deviations: Step-through pattern;Decreased stride length;Narrow base of support Gait velocity: decreased   General Gait Details: pt still with narrow BOS but no scissoring noted this session; cues for upright posture/forward gaze and PLB; seated  breaks due to fatigue and DOE   Chief Strategy Officer    Modified Rankin (Stroke Patients Only)       Balance Overall balance assessment: Needs assistance   Sitting balance-Leahy Scale: Fair Sitting balance - Comments: no LOB with donning R sock   Standing balance support: During functional activity Standing balance-Leahy Scale: Poor Standing balance comment: props with one hand on sink in static standing, RW and min assist for dynamic                            Cognition Arousal/Alertness: Awake/alert Behavior During Therapy: WFL for tasks assessed/performed Overall Cognitive Status: Impaired/Different from baseline Area of Impairment: Following commands;Problem solving                       Following Commands: Follows one step commands with increased time;Follows multi-step commands inconsistently     Problem Solving: Slow processing;Decreased initiation;Difficulty sequencing;Requires verbal cues;Requires tactile cues General Comments: some problem solving difficulties; pt more talkative and joking with staff today, able to explain in detail his recent medical issues, aware of time of day       Exercises      General Comments General comments (skin integrity, edema, etc.): HR into 140s with mobility; SpO2 99% on RA       Pertinent Vitals/Pain Pain Assessment: Faces Faces Pain Scale: Hurts little more Pain Location: abdomen Pain Descriptors / Indicators: Constant;Discomfort Pain Intervention(s): Monitored during session;Repositioned    Home Living  Prior Function            PT Goals (current goals can now be found in the care plan section) Acute Rehab PT Goals Patient Stated Goal: feel better Progress towards PT goals: Progressing toward goals    Frequency    Min 3X/week      PT Plan Current plan remains appropriate    Co-evaluation PT/OT/SLP Co-Evaluation/Treatment:  Yes Reason for Co-Treatment: For patient/therapist safety;To address functional/ADL transfers   OT goals addressed during session: ADL's and self-care;Proper use of Adaptive equipment and DME      AM-PAC PT "6 Clicks" Mobility   Outcome Measure  Help needed turning from your back to your side while in a flat bed without using bedrails?: A Little Help needed moving from lying on your back to sitting on the side of a flat bed without using bedrails?: A Little Help needed moving to and from a bed to a chair (including a wheelchair)?: A Little Help needed standing up from a chair using your arms (e.g., wheelchair or bedside chair)?: A Little Help needed to walk in hospital room?: A Little Help needed climbing 3-5 steps with a railing? : A Lot 6 Click Score: 17    End of Session Equipment Utilized During Treatment: Gait belt Activity Tolerance: Patient tolerated treatment well Patient left: in chair;with call bell/phone within reach Nurse Communication: Mobility status PT Visit Diagnosis: Unsteadiness on feet (R26.81);Muscle weakness (generalized) (M62.81);Difficulty in walking, not elsewhere classified (R26.2);Pain Pain - part of body: (abdomen)     Time: 6222-9798 PT Time Calculation (min) (ACUTE ONLY): 29 min  Charges:  $Gait Training: 8-22 mins                     Erline Levine, PTA Acute Rehabilitation Services Pager: [phone] Office: [phone]     Carolynne Edouard 05/22/2019, 2:19 PM

## 2019-05-22 NOTE — Progress Notes (Signed)
Subjective: Feeling well, but he is frustrated with his care.  Objective: Vital signs in last 24 hours: Temp:  [98.3 F (36.8 C)-99.4 F (37.4 C)] 98.6 F (37 C) (02/05 0732) Pulse Rate:  [107-124] 115 (02/05 0732) Resp:  [19-31] 24 (02/05 0732) BP: (119-151)/(53-85) 124/53 (02/05 0732) SpO2:  [96 %-99 %] 96 % (02/05 0732) Weight:  [134.8 kg] 134.8 kg (02/05 0320) Last BM Date: 05/19/19  Intake/Output from previous day: 02/04 0701 - 02/05 0700 In: 1680 [NG/GT:1680] Out: 700 [Urine:700] Intake/Output this shift: No intake/output data recorded.  General appearance: alert and no distress Resp: clear to auscultation bilaterally Cardio: regular rate and rhythm and tachycardic GI: tender in the mid abdomen Extremities: extremities normal, atraumatic, no cyanosis or edema  Lab Results: Recent Labs    05/20/19 0747 05/21/19 0223 05/22/19 0156  WBC 8.2 8.1 4.8  HGB 9.7* 9.3* 8.9*  HCT 30.4* 29.5* 28.3*  PLT 186 210 245   BMET Recent Labs    05/20/19 0747 05/21/19 0223 05/22/19 0156  NA 144 138 141  K 4.1 4.3 4.3  CL 101 102 103  CO2 27 25 26   GLUCOSE 114* 127* 226*  BUN 15 19 17   CREATININE 0.71 0.83 0.89  CALCIUM 9.1 8.3* 8.4*   LFT Recent Labs    05/22/19 0156  PROT 7.3  ALBUMIN 1.4*  AST 30  ALT 30  ALKPHOS 121  BILITOT 0.5   PT/INR No results for input(s): LABPROT, INR in the last 72 hours. Hepatitis Panel No results for input(s): HEPBSAG, HCVAB, HEPAIGM, HEPBIGM in the last 72 hours. C-Diff No results for input(s): CDIFFTOX in the last 72 hours. Fecal Lactopherrin No results for input(s): FECLLACTOFRN in the last 72 hours.  Studies/Results: No results found.  Medications:  Scheduled: . allopurinol  300 mg Oral Daily  . chlorhexidine  15 mL Mouth Rinse BID  . enoxaparin (LOVENOX) injection  40 mg Subcutaneous Q24H  . feeding supplement (PRO-STAT SUGAR FREE 64)  30 mL Per Tube TID  . insulin aspart  0-20 Units Subcutaneous Q6H  . insulin  aspart  12 Units Subcutaneous Q6H  . insulin glargine  20 Units Subcutaneous QHS  . insulin glargine  45 Units Subcutaneous Daily  . mouth rinse  15 mL Mouth Rinse q12n4p  . pantoprazole (PROTONIX) IV  40 mg Intravenous Q12H   Continuous: . feeding supplement (VITAL 1.5 CAL) 60 mL/hr at 05/22/19 0000    Assessment/Plan: 1) Severe acute pancreatitis. 2) ABM pain. 3) Severe malnutrition. 4) Deconditioning. 5) DM.   I spoke with Nursing and the patient.  The patient starts to hallucinate when he receives 1 mg of Dilaudid.  His dosing was decreased to 0.5 mg, but he does not feel that it is controlling his pain as well.  Also, he does not participate in PT.  He is feeling better, but he is frustrated with his pain control.  I had a long discussion with the patient and I instructed/encouraged him to participate in some for of PT.  Also, it may be helpful to start a longer acting pain medication to help manage his pain better, but there is still a risk of AMS.  He desires to have some liquids to wet his mouth as it is very dry and he will be placed on a clear liquid diet. This is where he can have some ice chips and water on hand to moisten his mouth, but he does not feel as if he wants to drink the  fluids.  Plan: 1) Clear liquid diet. 2) Encouraged PT. 3) Better control of his DM is necessary.  This will be deferred to Hospitalist. 4) Trial of MS Contin.  LOS: 10 days   Caleb Chambers D 05/22/2019, 7:51 AM

## 2019-05-22 NOTE — Progress Notes (Signed)
Assisted to chair by PT/OT. After 1 hour asked to be assisted back to bed. Explained and encouraged to be in chair for at least 2 hours but refused. To bed with 2 assist.

## 2019-05-22 NOTE — Progress Notes (Signed)
PROGRESS NOTE  Caleb Chambers ZOX:[id number] DOB: 06-29-1965 DOA: 05/12/2019 PCP: Patient, No Pcp Per   LOS: 10 days   Brief narrative: As per HPI,  Caleb Chambers is a 54 y.o. male with medical history significant of HTN, DM, gout; and recent admission for pancreatitis and DKA  presented to the hospital with complaints of minimal oral intake since his discharge.  Patient reported shortness of breath and decreased ability to eat and drink.  He has not been fully compliant with insulin regimen as well.  Patient was then admitted to the hospital again for DKA and poor oral intolerance.  Assessment/Plan:  Principal Problem:   DKA (diabetic ketoacidosis) (HCC) Active Problems:   Acute pancreatitis   Acute kidney injury (HCC)   Morbid obesity (HCC)   Debility   Protein-calorie malnutrition, severe  Recurrent DKA Resolved.  Hemoglobin A1c was 13.2 on 1/6  on tube feeding. Currently on basal bolus and subcu insulin. On Lantus to 45 units in am and 20 units at night,and short acting insulin  12 units q6h with resistant scale insulin. on sliding  NovoLog to every 6 hourly. Last POC glucose of 145.  Patient was on every 4 hourly insulin which was slightly decreased to 05/21/2019.  If the patient will be started on clear liquids he might need to go back on every 4 hourly insulin.  Severe pancreatitis with peripancreatic fluid collection.  Has been started on clears by GI today.  Nausea and vomiting has improved.  Still complains of moderate amount of pain 7 x 10 in intensity today.  Still requiring IV Dilaudid.  Was initially on Dilaudid pump.  History of cholecystectomy in the past.  Continue IV Protonix.   CT scan of the abdomen and pelvis was performed which showed severe pancreatitis with large pancreatic fluid collection with dimensions of 17.2 x 17.4 x 16.6 cm. Status post IR guided feeding tube placement. No fever or leukocytosis.  Lipase of 26 on 05/16/2019.  Expecting conservative management  and slow recovery at this time.  GI following the patient, and has added MS Contin today with clear liquids.  Follow GI recommendations.  Mild sore throat.  Added lidocaine viscous.  Mild erythema noted.  Improved today.  Mild tachycardia, history of hypertension.  on IV Cardizem 10 mg every 6 hours.  If remains tachycardic and oral intake is adequate code changed to p.o. and or add metoprolol.  Would avoid lisinopril.  Hypokalemia Replenished.  Hypomagnesemia.   Magnesium of 1.7 today.    Hypophosphatemia.  Replenished.    Acute kidney injury on presentation.  Likely secondary to acute pancreatitis and volume depletion. Resolved.  Essential hypertension Patient was on Zestoretic and Norvasc at home.  Will continue to hold for now.  On as needed IV Cardizem at this time.   Morbid obesity -BMI 43.4, now NPO.  Debility and deconditioning. PT recommend CIR  Severe protein calorie malnutrition present on admission.  Nutrition on board.  On tube feeding at this time.  Albumin of 1.5.  Does have poor appetite.  VTE Prophylaxis: Lovenox subq  Code Status:  Full code  Family Communication:  I spoke with the patient's brother Casimiro Needle on the phone and updated him about the clinical condition of the patient.  Disposition Plan:  Patient is from home. Patient has been seen by physical therapy who recommended CIR.  Likely disposition to CIR in 2 to 3 days. Barriers to discharge include improvement of severe pancreatitis, still on cortrak tube for  feeding, needing IV Dilaudid for abdominal pain.  Consultants:  GI  Interventional radiology  Procedures:  Cortrak feeding tube placement 05/14/2019  Antibiotics:  Anti-infectives (From admission, onward)   None     Subjective: Today, patient still complains of moderate abdominal pain.  No nausea, vomiting had bowel movement.  Mild sore throat.  Has been requiring IV Dilaudid for pain.  Denies any fever, chills or rigor.  Denies  shortness of breath cough  Objective: Vitals:   05/22/19 0400 05/22/19 0732  BP:  (!) 124/53  Pulse:  (!) 115  Resp: (!) 31 (!) 24  Temp:  98.6 F (37 C)  SpO2:  96%    Intake/Output Summary (Last 24 hours) at 05/22/2019 0952 Last data filed at 05/22/2019 0000 Gross per 24 hour  Intake 1680 ml  Output 700 ml  Net 980 ml   Filed Weights   05/20/19 0403 05/21/19 0356 05/22/19 0320  Weight: 134.5 kg 134.6 kg 134.8 kg   Body mass index is 40.3 kg/m.   Physical Exam:  General: Obese built,, not in obvious distress, cortrak tube in place HENT: Normocephalic, pupils equally reacting to light and accommodation.  No scleral pallor or icterus noted. Oral mucosa is dry Chest:  Clear breath sounds.  Diminished breath sounds bilaterally. No crackles or wheezes.  CVS: S1 &S2 heard. No murmur.  Regular rate and rhythm. Abdomen: Soft, mild nonspecific epigastric tenderness nondistended. Extremities: No cyanosis, clubbing or edema.  Peripheral pulses are palpable. Psych: Alert, awake and oriented, weak voice. CNS:  No cranial nerve deficits.  Power equal in all extremities.  Generalized weakness noted  Skin: Warm and dry.  No rashes noted.   Data Review: I have personally reviewed the following laboratory data and studies,  CBC: Recent Labs  Lab 05/18/19 0255 05/19/19 0224 05/20/19 0747 05/21/19 0223 05/22/19 0156  WBC 9.5 8.7 8.2 8.1 4.8  NEUTROABS  --   --  6.1 6.2 3.8  HGB 9.9* 10.1* 9.7* 9.3* 8.9*  HCT 30.5* 31.2* 30.4* 29.5* 28.3*  MCV 75.3* 75.7* 76.2* 76.6* 76.1*  PLT 173 161 186 210 951   Basic Metabolic Panel: Recent Labs  Lab 05/16/19 0222 05/16/19 0222 05/17/19 0242 05/17/19 0242 05/18/19 0255 05/19/19 0224 05/20/19 0747 05/21/19 0223 05/22/19 0156  NA 132*   < > 138   < > 140 139 144 138 141  K 3.6   < > 3.8   < > 4.1 4.2 4.1 4.3 4.3  CL 102   < > 104   < > 105 103 101 102 103  CO2 22   < > 24   < > 25 27 27 25 26   GLUCOSE 339*   < > 332*   < > 196* 191*  114* 127* 226*  BUN 13   < > 15   < > 14 16 15 19 17   CREATININE 0.84   < > 0.83   < > 0.77 0.81 0.71 0.83 0.89  CALCIUM 9.7   < > 9.8   < > 9.3 9.0 9.1 8.3* 8.4*  MG 1.9   < > 1.6*   < > 1.9 1.7 1.5* 1.8 1.7  PHOS 1.3*  --  1.6*  --  2.9 2.1*  --  3.0  --    < > = values in this interval not displayed.   Liver Function Tests: Recent Labs  Lab 05/18/19 0255 05/19/19 0224 05/20/19 0747 05/21/19 0223 05/22/19 0156  AST 15 22 29  38 30  ALT 12 15 23 23 30   ALKPHOS 88 96 94 113 121  BILITOT 0.2* 0.4 0.6 1.1 0.5  PROT 6.7 6.8 6.9 6.7 7.3  ALBUMIN 1.5* 1.5* 1.5* 1.4* 1.4*   Recent Labs  Lab 05/16/19 0222  LIPASE 26   No results for input(s): AMMONIA in the last 168 hours. Cardiac Enzymes: No results for input(s): CKTOTAL, CKMB, CKMBINDEX, TROPONINI in the last 168 hours. BNP (last 3 results) Recent Labs    04/23/19 0450  BNP 99.2    ProBNP (last 3 results) No results for input(s): PROBNP in the last 8760 hours.  CBG: Recent Labs  Lab 05/21/19 0833 05/21/19 1530 05/21/19 2102 05/22/19 0308 05/22/19 0850  GLUCAP 84 236* 229* 192* 145*   Recent Results (from the past 240 hour(s))  SARS CORONAVIRUS 2 (TAT 6-24 HRS) Nasopharyngeal Nasopharyngeal Swab     Status: None   Collection Time: 05/12/19  2:36 PM   Specimen: Nasopharyngeal Swab  Result Value Ref Range Status   SARS Coronavirus 2 NEGATIVE NEGATIVE Final    Comment: (NOTE) SARS-CoV-2 target nucleic acids are NOT DETECTED. The SARS-CoV-2 RNA is generally detectable in upper and lower respiratory specimens during the acute phase of infection. Negative results do not preclude SARS-CoV-2 infection, do not rule out co-infections with other pathogens, and should not be used as the sole basis for treatment or other patient management decisions. Negative results must be combined with clinical observations, patient history, and epidemiological information. The expected result is Negative. Fact Sheet for  Patients: 05/14/19 Fact Sheet for Healthcare Providers: HairSlick.no This test is not yet approved or cleared by the quierodirigir.com FDA and  has been authorized for detection and/or diagnosis of SARS-CoV-2 by FDA under an Emergency Use Authorization (EUA). This EUA will remain  in effect (meaning this test can be used) for the duration of the COVID-19 declaration under Section 56 4(b)(1) of the Act, 21 U.S.C. section 360bbb-3(b)(1), unless the authorization is terminated or revoked sooner. Performed at [facility], [address], [place], [place]   MRSA PCR Screening     Status: None   Collection Time: 05/13/19  7:29 PM   Specimen: Nasal Mucosa; Nasopharyngeal  Result Value Ref Range Status   MRSA by PCR NEGATIVE NEGATIVE Final    Comment:        The GeneXpert MRSA Assay (FDA approved for NASAL specimens only), is one component of a comprehensive MRSA colonization surveillance program. It is not intended to diagnose MRSA infection nor to guide or monitor treatment for MRSA infections. Performed at [facility], [address], [place], [place]      Studies: No results found.  Scheduled Meds: . allopurinol  300 mg Oral Daily  . chlorhexidine  15 mL Mouth Rinse BID  . enoxaparin (LOVENOX) injection  40 mg Subcutaneous Q24H  . feeding supplement (PRO-STAT SUGAR FREE 64)  30 mL Per Tube TID  . insulin aspart  0-20 Units Subcutaneous Q6H  . insulin aspart  12 Units Subcutaneous Q6H  . insulin glargine  20 Units Subcutaneous QHS  . insulin glargine  45 Units Subcutaneous Daily  . mouth rinse  15 mL Mouth Rinse q12n4p  . morphine  15 mg Oral Q12H  . pantoprazole (PROTONIX) IV  40 mg Intravenous Q12H    Continuous Infusions: . feeding supplement (VITAL 1.5 CAL) 60 mL/hr at 05/22/19 0000    07/20/19, MD  [facility] 05/22/2019

## 2019-05-22 NOTE — Progress Notes (Signed)
Occupational Therapy Treatment Patient Details Name: Caleb Chambers MRN: [id number] DOB: 06/22/1965 Today's Date: 05/22/2019    History of present illness Pt is a 54 year old man admitted 05/12/19 with N/V, abdominal pain and DKA. Pt discharged from [facility] on 04/29/19 with DKA and pancreatitis. He has not been able to eat much since his discharge. PMH: morbid obesity, HTN, gout, Crohn's disease, IDDM.    OT comments  Pt asking for OOB activity today, feeling better and pain improved. Performed dressing with min to mod assist, ambulated with chair follow in hall and then stood for one grooming activity at sink and sat for second. Pt with HR to 137 with mobility. Pt with improved orientation to time and situation, but continues to demonstrate slow processing and difficulty following commands. Pt is an excellent CIR candidate.  Follow Up Recommendations  CIR    Equipment Recommendations  3 in 1 bedside commode    Recommendations for Other Services      Precautions / Restrictions Precautions Precautions: Fall Precaution Comments: watch HR, Cortrak       Mobility Bed Mobility Overal bed mobility: Needs Assistance Bed Mobility: Supine to Sit     Supine to sit: Min guard     General bed mobility comments: increased time and needing brief rest break upon sitting at EOB  Transfers Overall transfer level: Needs assistance Equipment used: Rolling walker (2 wheeled) Transfers: Sit to/from Stand Sit to Stand: Min guard;Min assist         General transfer comment: cues for hand placement, needing min assist as pt fatigued to steady    Balance Overall balance assessment: Needs assistance   Sitting balance-Leahy Scale: Fair Sitting balance - Comments: no LOB with donning R sock   Standing balance support: During functional activity Standing balance-Leahy Scale: Poor Standing balance comment: props with one hand on sink in static standing, RW and min assist for dynamic                            ADL either performed or assessed with clinical judgement   ADL Overall ADL's : Needs assistance/impaired     Grooming: Oral care;Wash/dry hands;Standing;Minimal assistance;Sitting Grooming Details (indicate cue type and reason): tolerated standing for one actvity with chair behind him for safety, sat to complete second activity         Upper Body Dressing : Minimal assistance;Sitting   Lower Body Dressing: Moderate assistance;Sitting/lateral leans Lower Body Dressing Details (indicate cue type and reason): pt donned R sock, OT donned L             Functional mobility during ADLs: Minimal assistance;Rolling walker;+2 for safety/equipment General ADL Comments: Pt with HR to 137 with ambulation.     Vision       Perception     Praxis      Cognition Arousal/Alertness: Awake/alert Behavior During Therapy: WFL for tasks assessed/performed Overall Cognitive Status: Impaired/Different from baseline Area of Impairment: Following commands;Problem solving                       Following Commands: Follows one step commands with increased time;Follows multi-step commands inconsistently     Problem Solving: Slow processing;Decreased initiation;Difficulty sequencing;Requires verbal cues;Requires tactile cues General Comments: pt more talkative and joking with staff today, able to explain in detail his recent medical issues, aware of time of day         Exercises  Shoulder Instructions       General Comments      Pertinent Vitals/ Pain       Pain Assessment: Faces Faces Pain Scale: Hurts little more Pain Location: abdomen Pain Descriptors / Indicators: Constant;Discomfort Pain Intervention(s): Monitored during session;Repositioned  Home Living                                          Prior Functioning/Environment              Frequency  Min 2X/week        Progress Toward Goals  OT Goals(current  goals can now be found in the care plan section)  Progress towards OT goals: Progressing toward goals  Acute Rehab OT Goals Patient Stated Goal: feel better OT Goal Formulation: With patient Time For Goal Achievement: 05/27/19 Potential to Achieve Goals: Good  Plan Discharge plan remains appropriate    Co-evaluation    PT/OT/SLP Co-Evaluation/Treatment: Yes Reason for Co-Treatment: For patient/therapist safety;To address functional/ADL transfers   OT goals addressed during session: ADL's and self-care;Proper use of Adaptive equipment and DME      AM-PAC OT "6 Clicks" Daily Activity     Outcome Measure   Help from another person eating meals?: None Help from another person taking care of personal grooming?: A Little Help from another person toileting, which includes using toliet, bedpan, or urinal?: A Lot Help from another person bathing (including washing, rinsing, drying)?: A Lot Help from another person to put on and taking off regular upper body clothing?: A Little Help from another person to put on and taking off regular lower body clothing?: A Lot 6 Click Score: 16    End of Session Equipment Utilized During Treatment: Gait belt;Rolling walker  OT Visit Diagnosis: Unsteadiness on feet (R26.81);Pain;Muscle weakness (generalized) (M62.81)   Activity Tolerance Patient tolerated treatment well   Patient Left in chair;with call bell/phone within reach   Nurse Communication Mobility status        Time: 0109-3235 OT Time Calculation (min): 31 min  Charges: OT General Charges $OT Visit: 1 Visit OT Treatments $Self Care/Home Management : 8-22 mins  [name], OTR/L Acute Rehabilitation Services Pager: [phone] Office: [phone]   [name] 05/22/2019, 1:53 PM

## 2019-05-23 DIAGNOSIS — K863 Pseudocyst of pancreas: Secondary | ICD-10-CM

## 2019-05-23 DIAGNOSIS — R1013 Epigastric pain: Secondary | ICD-10-CM

## 2019-05-23 LAB — GLUCOSE, CAPILLARY
Glucose-Capillary: 122 mg/dL — ABNORMAL HIGH (ref 70–99)
Glucose-Capillary: 120 mg/dL — ABNORMAL HIGH (ref 70–99)
Glucose-Capillary: 99 mg/dL (ref 70–99)
Glucose-Capillary: 225 mg/dL — ABNORMAL HIGH (ref 70–99)

## 2019-05-23 NOTE — Progress Notes (Signed)
At 5.40pm pt said he is nauseous, covered with Zofran, HR found high 120-130's. Cardizem 5 mg provided. Meanwhile pt was complaining pain at the same time, dilaudid 0.5mg  given, this time is resting comfortably, MEWS score is yellow and it is not acute change, will continue to monitor  [facility]

## 2019-05-23 NOTE — Progress Notes (Signed)
Gastroenterology Inpatient Follow-up Note Covering for Caleb Chambers/Caleb Chambers   PATIENT IDENTIFICATION  Caleb Chambers is a 54 y.o. male with a pmh significant for hypertension, gout, diabetes, pancreatitis complicated by large peripancreatic fluid collection/pseudocyst.   Hospital Day: 12  SUBJECTIVE  Patient still having abdominal discomfort even on the transition no change medications.  Somewhat frustrated.  Core track continues.  The patient denies any fevers or chills.   OBJECTIVE  Scheduled Inpatient Medications:  . allopurinol  300 mg Oral Daily  . chlorhexidine  15 mL Mouth Rinse BID  . enoxaparin (LOVENOX) injection  40 mg Subcutaneous Q24H  . feeding supplement (PRO-STAT SUGAR FREE 64)  30 mL Per Tube TID  . insulin aspart  0-20 Units Subcutaneous Q6H  . insulin aspart  12 Units Subcutaneous Q6H  . insulin glargine  20 Units Subcutaneous QHS  . insulin glargine  45 Units Subcutaneous Daily  . mouth rinse  15 mL Mouth Rinse q12n4p  . morphine  15 mg Oral Q12H  . pantoprazole (PROTONIX) IV  40 mg Intravenous Q12H   Continuous Inpatient Infusions:  . feeding supplement (VITAL 1.5 CAL) 60 mL/hr at 05/23/19 1919   PRN Inpatient Medications: acetaminophen, bisacodyl, dextrose, diltiazem, diphenhydrAMINE **OR** diphenhydrAMINE, HYDROmorphone (DILAUDID) injection, lidocaine, naloxone **AND** sodium chloride flush, ondansetron (ZOFRAN) IV, phenol   Physical Examination  Temp:  [97.7 F (36.5 C)-99.7 F (37.6 C)] 97.7 F (36.5 C) (02/06 1919) Pulse Rate:  [110-125] 121 (02/06 1919) Resp:  [18-31] 31 (02/06 1919) BP: (108-143)/(52-89) 108/68 (02/06 1919) SpO2:  [93 %-98 %] 95 % (02/06 1919) Weight:  [136.1 kg] 136.1 kg (02/06 0344) Temp (24hrs), Avg:98.5 F (36.9 C), Min:97.7 F (36.5 C), Max:99.7 F (37.6 C)  Weight: (!) 136.1 kg GEN: Appears chronically ill but nontoxic  PSYCH: Cooperative EYE: Conjunctivae pink, sclerae anicteric ENT: MMM CV: Nontachycardic RESP: No  wheezing present GI: Protuberant abdomen, rounded, obese, tenderness to palpation throughout the abdomen but mostly in the mid abdomen, no rebound MSK/EXT: Lower extremity edema present SKIN: No jaundice NEURO:  Alert & Oriented x 3, no focal deficits   Review of Data   Laboratory Studies   Recent Labs  Lab 05/18/19 0255 05/18/19 0255 05/19/19 0224 05/20/19 0747 05/21/19 0223 05/21/19 0223 05/22/19 0156  NA 140   < > 139   < > 138   < > 141  K 4.1   < > 4.2   < > 4.3   < > 4.3  CL 105   < > 103   < > 102   < > 103  CO2 25   < > 27   < > 25   < > 26  BUN 14   < > 16   < > 19   < > 17  CREATININE 0.77   < > 0.81   < > 0.83   < > 0.89  GLUCOSE 196*   < > 191*   < > 127*   < > 226*  CALCIUM 9.3   < > 9.0   < > 8.3*   < > 8.4*  MG 1.9   < > 1.7   < > 1.8   < > 1.7  PHOS 2.9  --  2.1*  --  3.0  --   --    < > = values in this interval not displayed.   Recent Labs  Lab 05/22/19 0156  AST 30  ALT 30  ALKPHOS 121    Recent Labs  Lab 05/20/19 0747 05/20/19 0747 05/21/19 0223 05/21/19 0223 05/22/19 0156  WBC 8.2   < > 8.1   < > 4.8  HGB 9.7*   < > 9.3*   < > 8.9*  HCT 30.4*   < > 29.5*   < > 28.3*  PLT 186  --  210  --  245   < > = values in this interval not displayed.   No results for input(s): APTT, INR in the last 168 hours. Computed MELD-Na score unavailable. Necessary lab results were not found in the last year. Computed MELD score unavailable. Necessary lab results were not found in the last year.  Imaging Studies  No new imaging studies to review  GI Procedures and Studies  No results found.   ASSESSMENT  Caleb Chambers is a 54 y.o. male  with a pmh significant for hypertension, gout, diabetes, pancreatitis complicated by large peripancreatic fluid collection/pseudocyst.    Patient remains hemodynamically stable.  Clinically this is her first time I meet him but there is evidence of overall stability based on the past off that I received.  Pain remains an  issue.  May need further adjustment of medication by primary medical service.  I think there may be some benefit in the near future for consideration of a cyst gastrostomy and I will discuss this further with the patient's primary gastroenterologist Dr. Benson Chambers.  In the course of the next week or so the patient is still in the hospital or having issues with progressing may be a repeat CT abdomen pelvis may be helpful.  Again, I will discuss this with the patient's primary gastroenterologist.  I will not introduce this information to the patient as of yet.  We will see how he is doing tomorrow.   PLAN/RECOMMENDATIONS  Continue core track feeding Laboratories have been ordered for tomorrow Abdomen two-view for tomorrow morning Query in future repeat cross-sectional imaging to potentially consider role of cyst gastrostomy or pseudocyst remains present (await discussion with Dr. Benson Chambers) before pursuing discussed with patient   Please page/call with questions or concerns.   Caleb Britain, MD [facility] Office # [phone]    LOS: 82 days  Caleb Chambers  05/23/2019, 10:41 PM

## 2019-05-23 NOTE — Progress Notes (Signed)
PROGRESS NOTE  Caleb URSIN WCH:[id number] DOB: [birth year] DOA: 05/12/2019 PCP: Patient, No Pcp Per   LOS: 11 days   Brief narrative: Patient is a 54 year old African-American male, morbidly obese, with past medical history significant for hypertension, DM, gout; and recent admission for pancreatitis and DKA.  Patient presented to the hospital with complaints of minimal oral intake since his discharge.  Patient reported shortness of breath and decreased ability to eat and drink.  He has not been fully compliant with insulin regimen as well.  Patient was then admitted to the hospital again for DKA and acute kidney injury, prerenal.   Assessment/Plan:  Principal Problem:   DKA (diabetic ketoacidosis) (HCC) Active Problems:   Acute pancreatitis   Acute kidney injury (HCC)   Morbid obesity (HCC)   Debility   Protein-calorie malnutrition, severe  Recurrent DKA -Resolved.   -Blood sugars reasonably controlled.   -Hemoglobin A1c was 13.2 on 04/22/2019  -Patient is currently on tube feeding due to poor p.o. intake.   -Currently on basal bolus and subcu insulin. On Lantus to 45 units in am and 20 units at night, subcutaneous NovoLog 12 units every 6 hours and resistant sliding scale insulin coverage.   Severe pancreatitis with peripancreatic fluid collection: -Currently on clears.   -No nausea and vomiting reported.   -Patient continues to report pain. -Ensure adequate p.o. intake.  -Ensure adequate pain control.  -History of cholecystectomy in the past.  CT scan of the abdomen and pelvis done on 05/13/2019 revealed severe pancreatitis with large pancreatic fluid collection with dimensions of 17.2 x 17.4 x 16.6 cm. Status post IR guided feeding tube placement. No fever or leukocytosis.  Lipase of 26 on 05/16/2019.  Expecting conservative management and slow recovery at this time.  GI following the patient, and has added MS Contin today with clear liquids.  Follow GI recommendations.  Mild  sore throat.  Added lidocaine viscous.  Mild erythema noted.  Improved today.  Mild tachycardia: -on IV Cardizem 10 mg every 6 hours as needed.   -Likely multifactorial -Optimize pain control -Monitor and optimize volume status -Further management will depend on hospital course.   History of hypertension: -Controlled.   -Continue to monitor closely.   Hypokalemia Replenished.  Hypomagnesemia: -Continue to monitor closely.     Hypophosphatemia.  Replenished.    Acute kidney injury on presentation: -Resolved.     Morbid obesity -BMI 43.4 -Further management on outpatient visit.  Debility and deconditioning. PT recommend CIR  Severe protein calorie malnutrition: -Caloric count  -Dietary team input is appreciated.     VTE Prophylaxis: Lovenox subq  Code Status:  Full code  Family Communication:   Disposition Plan:  Patient is from home. Patient has been seen by physical therapy who recommended CIR.  Likely disposition to CIR in 2 to 3 days. Barriers to discharge include improvement of severe pancreatitis, still on cortrak tube for feeding, needing IV Dilaudid for abdominal pain.  Consultants:  GI  Interventional radiology  Procedures:  Cortrak feeding tube placement 05/14/2019  Antibiotics:  Anti-infectives (From admission, onward)   None     Subjective: Patient continues to report abdominal pain. No nausea or vomiting No fever or chills  Objective: Vitals:   05/23/19 0344 05/23/19 0753  BP: (!) 143/89 136/79  Pulse: (!) 120 (!) 117  Resp: 18 20  Temp: 98.1 F (36.7 C) 99.7 F (37.6 C)  SpO2: 98% 93%    Intake/Output Summary (Last 24 hours) at 05/23/2019 1038 Last data  filed at 05/23/2019 0818 Gross per 24 hour  Intake 2100 ml  Output 1130 ml  Net 970 ml   Filed Weights   05/21/19 0356 05/22/19 0320 05/23/19 0344  Weight: 134.6 kg 134.8 kg (!) 136.1 kg   Body mass index is 40.69 kg/m.   Physical Exam:  General: Morbidly obese.   Awake and alert.  Not in any real distress. HEENT: Pallor.  No jaundice.   Chest:  Clear to auscultation.    CVS: S1 &S2  Abdomen: Morbidly obese, soft and nontender.  Organs are difficult to assess.   CNS: Patient is awake and alert.  Patient moves all extremities.  Extremities: Fullness of the ankle without any overt edema.    Data Review: I have personally reviewed the following laboratory data and studies,  CBC: Recent Labs  Lab 05/18/19 0255 05/19/19 0224 05/20/19 0747 05/21/19 0223 05/22/19 0156  WBC 9.5 8.7 8.2 8.1 4.8  NEUTROABS  --   --  6.1 6.2 3.8  HGB 9.9* 10.1* 9.7* 9.3* 8.9*  HCT 30.5* 31.2* 30.4* 29.5* 28.3*  MCV 75.3* 75.7* 76.2* 76.6* 76.1*  PLT 173 161 186 210 749   Basic Metabolic Panel: Recent Labs  Lab 05/17/19 0242 05/17/19 0242 05/18/19 0255 05/19/19 0224 05/20/19 0747 05/21/19 0223 05/22/19 0156  NA 138   < > 140 139 144 138 141  K 3.8   < > 4.1 4.2 4.1 4.3 4.3  CL 104   < > 105 103 101 102 103  CO2 24   < > 25 27 27 25 26   GLUCOSE 332*   < > 196* 191* 114* 127* 226*  BUN 15   < > 14 16 15 19 17   CREATININE 0.83   < > 0.77 0.81 0.71 0.83 0.89  CALCIUM 9.8   < > 9.3 9.0 9.1 8.3* 8.4*  MG 1.6*   < > 1.9 1.7 1.5* 1.8 1.7  PHOS 1.6*  --  2.9 2.1*  --  3.0  --    < > = values in this interval not displayed.   Liver Function Tests: Recent Labs  Lab 05/18/19 0255 05/19/19 0224 05/20/19 0747 05/21/19 0223 05/22/19 0156  AST 15 22 29  38 30  ALT 12 15 23 23 30   ALKPHOS 88 96 94 113 121  BILITOT 0.2* 0.4 0.6 1.1 0.5  PROT 6.7 6.8 6.9 6.7 7.3  ALBUMIN 1.5* 1.5* 1.5* 1.4* 1.4*   No results for input(s): LIPASE, AMYLASE in the last 168 hours. No results for input(s): AMMONIA in the last 168 hours. Cardiac Enzymes: No results for input(s): CKTOTAL, CKMB, CKMBINDEX, TROPONINI in the last 168 hours. BNP (last 3 results) Recent Labs    04/23/19 0450  BNP 99.2    ProBNP (last 3 results) No results for input(s): PROBNP in the last 8760  hours.  CBG: Recent Labs  Lab 05/22/19 0850 05/22/19 1509 05/22/19 2012 05/23/19 0342 05/23/19 0852  GLUCAP 145* 144* 139* 122* 120*   Recent Results (from the past 240 hour(s))  MRSA PCR Screening     Status: None   Collection Time: 05/13/19  7:29 PM   Specimen: Nasal Mucosa; Nasopharyngeal  Result Value Ref Range Status   MRSA by PCR NEGATIVE NEGATIVE Final    Comment:        The GeneXpert MRSA Assay (FDA approved for NASAL specimens only), is one component of a comprehensive MRSA colonization surveillance program. It is not intended to diagnose MRSA infection nor to guide or  monitor treatment for MRSA infections. Performed at [facility], [address] [place], [place] [address]      Studies: No results found.  Scheduled Meds: . allopurinol  300 mg Oral Daily  . chlorhexidine  15 mL Mouth Rinse BID  . enoxaparin (LOVENOX) injection  40 mg Subcutaneous Q24H  . feeding supplement (PRO-STAT SUGAR FREE 64)  30 mL Per Tube TID  . insulin aspart  0-20 Units Subcutaneous Q6H  . insulin aspart  12 Units Subcutaneous Q6H  . insulin glargine  20 Units Subcutaneous QHS  . insulin glargine  45 Units Subcutaneous Daily  . mouth rinse  15 mL Mouth Rinse q12n4p  . morphine  15 mg Oral Q12H  . pantoprazole (PROTONIX) IV  40 mg Intravenous Q12H    Continuous Infusions: . feeding supplement (VITAL 1.5 CAL) 1,000 mL (05/23/19 0905)    Barnetta Chapel, MD  [facility] 05/23/2019

## 2019-05-23 NOTE — Progress Notes (Signed)
Pt ambulated in a room and to the bathroom. Pain medicine given for a complain of right abdominal pain. Feeding is continue @65 , no any other complain of chest pain and SOB, will continue to monitor the patient  , RN

## 2019-05-24 ENCOUNTER — Inpatient Hospital Stay (HOSPITAL_COMMUNITY): Payer: BC Managed Care – PPO

## 2019-05-24 DIAGNOSIS — R1084 Generalized abdominal pain: Secondary | ICD-10-CM

## 2019-05-24 LAB — CBC WITH DIFFERENTIAL/PLATELET
Abs Immature Granulocytes: 0.05 10*3/uL (ref 0.00–0.07)
Basophils Absolute: 0.1 10*3/uL (ref 0.0–0.1)
Basophils Relative: 1 %
Eosinophils Absolute: 0 10*3/uL (ref 0.0–0.5)
Eosinophils Relative: 0 %
HCT: 26.5 % — ABNORMAL LOW (ref 39.0–52.0)
Hemoglobin: 8.4 g/dL — ABNORMAL LOW (ref 13.0–17.0)
Immature Granulocytes: 1 %
Lymphocytes Relative: 20 %
Lymphs Abs: 1.5 10*3/uL (ref 0.7–4.0)
MCH: 24 pg — ABNORMAL LOW (ref 26.0–34.0)
MCHC: 31.7 g/dL (ref 30.0–36.0)
MCV: 75.7 fL — ABNORMAL LOW (ref 80.0–100.0)
Monocytes Absolute: 0.6 10*3/uL (ref 0.1–1.0)
Monocytes Relative: 9 %
Neutro Abs: 5.1 10*3/uL (ref 1.7–7.7)
Neutrophils Relative %: 69 %
Platelets: 282 10*3/uL (ref 150–400)
RBC: 3.5 MIL/uL — ABNORMAL LOW (ref 4.22–5.81)
RDW: 15.6 % — ABNORMAL HIGH (ref 11.5–15.5)
WBC: 7.4 10*3/uL (ref 4.0–10.5)
nRBC: 0.5 % — ABNORMAL HIGH (ref 0.0–0.2)

## 2019-05-24 LAB — COMPREHENSIVE METABOLIC PANEL
ALT: 29 U/L (ref 0–44)
AST: 24 U/L (ref 15–41)
Albumin: 1.2 g/dL — ABNORMAL LOW (ref 3.5–5.0)
Alkaline Phosphatase: 123 U/L (ref 38–126)
Anion gap: 11 (ref 5–15)
BUN: 15 mg/dL (ref 6–20)
CO2: 24 mmol/L (ref 22–32)
Calcium: 8.2 mg/dL — ABNORMAL LOW (ref 8.9–10.3)
Chloride: 102 mmol/L (ref 98–111)
Creatinine, Ser: 0.87 mg/dL (ref 0.61–1.24)
GFR calc Af Amer: >60 mL/min (ref >60)
GFR calc non Af Amer: >60 mL/min (ref >60)
Glucose, Bld: 207 mg/dL — ABNORMAL HIGH (ref 70–99)
Potassium: 4.3 mmol/L (ref 3.5–5.1)
Sodium: 137 mmol/L (ref 135–145)
Total Bilirubin: 0.3 mg/dL (ref 0.3–1.2)
Total Protein: 6.7 g/dL (ref 6.5–8.1)

## 2019-05-24 LAB — GLUCOSE, CAPILLARY
Glucose-Capillary: 193 mg/dL — ABNORMAL HIGH (ref 70–99)
Glucose-Capillary: 150 mg/dL — ABNORMAL HIGH (ref 70–99)
Glucose-Capillary: 239 mg/dL — ABNORMAL HIGH (ref 70–99)
Glucose-Capillary: 156 mg/dL — ABNORMAL HIGH (ref 70–99)

## 2019-05-24 LAB — SEDIMENTATION RATE: Sed Rate: 120 mm/hr — ABNORMAL HIGH (ref 0–16)

## 2019-05-24 LAB — PHOSPHORUS: Phosphorus: 3.4 mg/dL (ref 2.5–4.6)

## 2019-05-24 LAB — MAGNESIUM: Magnesium: 1.7 mg/dL (ref 1.7–2.4)

## 2019-05-24 LAB — C-REACTIVE PROTEIN: CRP: 36.7 mg/dL — ABNORMAL HIGH (ref <1.0)

## 2019-05-24 MED ORDER — SODIUM CHLORIDE 0.9 % IV BOLUS
1000.0000 mL | Freq: Once | INTRAVENOUS | Status: AC
  Administered 2019-05-24: 11:00:00 1000 mL via INTRAVENOUS

## 2019-05-24 NOTE — Progress Notes (Signed)
Md notified. Pt c/o abdominal pain and n/v .  Zofran given at 0149 with mild relief.  Awaiting orders.  Will continue to monitor. Karena Addison T

## 2019-05-24 NOTE — Progress Notes (Signed)
Nutrition Brief Note  RD consulted for calorie count. Of note he is currently on a clear liquid diet making it difficult to meet nutrition needs with limited food options.   Continue tube feeding to meet 100% of needs. Will attempt calorie count once diet is advanced.   Vanessa Kick RD, LDN Clinical Nutrition Pager listed in AMION

## 2019-05-24 NOTE — Progress Notes (Signed)
Gastroenterology Inpatient Follow-up Note Covering for Caleb Chambers/Caleb Chambers   PATIENT IDENTIFICATION  Caleb Chambers is a 54 y.o. male with a pmh significant for hypertension, gout, diabetes, pancreatitis complicated by large peripancreatic fluid collection/pseudocyst.   Hospital Day: 13  SUBJECTIVE  No vomiting. No fevers or chills. Tachycardia present. Abdominal discomfort a bit worse today. He is wondering if this is ever going to get better.   OBJECTIVE  Scheduled Inpatient Medications:  . allopurinol  300 mg Oral Daily  . chlorhexidine  15 mL Mouth Rinse BID  . enoxaparin (LOVENOX) injection  40 mg Subcutaneous Q24H  . feeding supplement (PRO-STAT SUGAR FREE 64)  30 mL Per Tube TID  . insulin aspart  0-20 Units Subcutaneous Q6H  . insulin aspart  12 Units Subcutaneous Q6H  . insulin glargine  20 Units Subcutaneous QHS  . insulin glargine  45 Units Subcutaneous Daily  . mouth rinse  15 mL Mouth Rinse q12n4p  . morphine  15 mg Oral Q12H  . pantoprazole (PROTONIX) IV  40 mg Intravenous Q12H   Continuous Inpatient Infusions:  . feeding supplement (VITAL 1.5 CAL) 1,000 mL (05/24/19 1552)   PRN Inpatient Medications: acetaminophen, bisacodyl, dextrose, diltiazem, diphenhydrAMINE **OR** diphenhydrAMINE, HYDROmorphone (DILAUDID) injection, lidocaine, naloxone **AND** sodium chloride flush, ondansetron (ZOFRAN) IV, phenol   Physical Examination  Temp:  [97.7 F (36.5 C)-99 F (37.2 C)] 97.7 F (36.5 C) (02/07 1543) Pulse Rate:  [116-121] 116 (02/07 1543) Resp:  [20-34] 20 (02/07 1543) BP: (100-142)/(59-83) 113/59 (02/07 1543) SpO2:  [94 %-99 %] 99 % (02/07 1543) Weight:  [137.1 kg] 137.1 kg (02/07 0200) Temp (24hrs), Avg:98.2 F (36.8 C), Min:97.7 F (36.5 C), Max:99 F (37.2 C)  Weight: (!) 137.1 kg GEN: Appears chronically ill, but nontoxic  PSYCH: Cooperative EYE: Conjunctivae pink, sclerae anicteric ENT: MMM CV: Nontachycardic RESP: No wheezing present GI: Protuberant  abdomen, rounded, obese, tenderness to palpation in the mid abdomen with volitional guarding but no rebound MSK/EXT: Lower extremity edema present SKIN: No jaundice NEURO:  Alert & Oriented x 3, no focal deficits   Review of Data   Laboratory Studies   Recent Labs  Lab 05/19/19 0224 05/20/19 0747 05/21/19 0223 05/22/19 0156 05/24/19 0248  NA 139   < > 138   < > 137  K 4.2   < > 4.3   < > 4.3  CL 103   < > 102   < > 102  CO2 27   < > 25   < > 24  BUN 16   < > 19   < > 15  CREATININE 0.81   < > 0.83   < > 0.87  GLUCOSE 191*   < > 127*   < > 207*  CALCIUM 9.0   < > 8.3*   < > 8.2*  MG 1.7   < > 1.8   < > 1.7  PHOS 2.1*  --  3.0  --  3.4   < > = values in this interval not displayed.   Recent Labs  Lab 05/24/19 0248  AST 24  ALT 29  ALKPHOS 123    Recent Labs  Lab 05/21/19 0223 05/21/19 0223 05/22/19 0156 05/22/19 0156 05/24/19 0248  WBC 8.1   < > 4.8   < > 7.4  HGB 9.3*   < > 8.9*   < > 8.4*  HCT 29.5*   < > 28.3*   < > 26.5*  PLT 210  --  245  --  282   < > = values in this interval not displayed.   No results for input(s): APTT, INR in the last 168 hours.  Imaging Studies  DG Abd 2 Views  Result Date: 05/24/2019 CLINICAL DATA:  Abdominal pain. EXAM: ABDOMEN - 2 VIEW COMPARISON:  05/13/2010 and prior studies FINDINGS: A small bore feeding tube is identified with tip overlying the distal duodenum. The bowel gas pattern is unremarkable. No dilated bowel loops are present. No evidence of pneumoperitoneum. IMPRESSION: Unremarkable bowel gas pattern. No evidence of pneumoperitoneum. Electronically Signed   By: Harmon Pier M.D.   On: 05/24/2019 08:04    ASSESSMENT  Caleb Chambers is a 54 y.o. male  with a pmh significant for hypertension, gout, diabetes, pancreatitis complicated by large peripancreatic fluid collection/pseudocyst.    The patient is slightly worse than yesterday.  Pain medications may need to be adjusted further.  I do agree that I think he may benefit  in the next 1 to 2 days to have a cross-sectional CT abdomen pelvis with IV and oral contrast if he can tolerate to better define and see about the maturation of the pseudocyst.  I will discuss this with his primary gastroenterologist and we will consider this on Monday or Tuesday of this week.  I discussed this with the patient briefly and told him that we would do what ever we thought would be most beneficial for him but there were risks that would be associated by and I would talk with him in greater detail about this if we were to plan or felt that he was a candidate for them after repeat imaging.  He is optimistic and wants to hear what Caleb Chambers thinks about the potential for a procedure.   PLAN/RECOMMENDATIONS  Continue core track feeding Pain control as per primary service With progressive worsening I would consider repeat cross-sectional imaging on Monday or Tuesday to evaluate for transition or changes in cyst He wants to discuss this with Caleb Chambers before any type of intervention is entertained however If he develops any fevers or chills please obtain a more urgent CT abdomen/pelvis   Please page/call with questions or concerns.   Caleb Parish, MD [facility] Office # [phone]    LOS: 12 days  Caleb Chambers  05/24/2019, 4:34 PM

## 2019-05-24 NOTE — Progress Notes (Signed)
PROGRESS NOTE  TION TSE XHB:[id number] DOB: 31-Dec-1965 DOA: 05/12/2019 PCP: Patient, No Pcp Per   LOS: 12 days   Brief narrative: Patient is a 54 year old African-American male, morbidly obese, with past medical history significant for hypertension, DM, gout; and recent admission for pancreatitis and DKA.  Patient presented to the hospital with complaints of minimal oral intake since his discharge.  Patient reported shortness of breath and decreased ability to eat and drink.  He has not been fully compliant with insulin regimen as well.  Patient was then admitted to the hospital again for DKA and acute kidney injury, prerenal.  DKA and acute kidney injury have resolved.  Patient is currently being managed for complications of severe pancreatitis.  05/24/2019: Patient seen alongside patient's nurse.  Patient reports feeling unwell.  Patient seems intravascularly volume depleted.  Tachycardia is noted, in sinus.  We will proceed with 1 L of normal saline fluid bolus.  Based on prior documentation, GI team will be planning to repeat CT abdomen to reassess the pancreatic pseudocyst following acute severe pancreatitis, and likely further management.  Assessment/Plan:  Principal Problem:   DKA (diabetic ketoacidosis) ([place]) Active Problems:   Acute pancreatitis   Acute kidney injury ([place])   Morbid obesity ([place])   Debility   Protein-calorie malnutrition, severe  Recurrent DKA -Resolved.   -Blood sugars reasonably controlled.   -Hemoglobin A1c was 13.2 on 04/22/2019  -Patient is currently on tube feeding due to poor p.o. intake, as well as, clear liquid diet.   -Currently on basal bolus and subcu insulin. On Lantus to 45 units in am and 20 units at night, subcutaneous NovoLog 12 units every 6 hours and resistant sliding scale insulin coverage.   Severe pancreatitis with peripancreatic fluid collection: -Currently on clear liquid diet and tube feed.   -No nausea and vomiting reported.     -Repeat abdominal x-ray revealed normal gas pattern. -Patient continues to report pain. -Ensure adequate pain control.  -History of cholecystectomy in the past. -GI team may be planning to repeat CT abdomen tomorrow morning.  CT scan of the abdomen and pelvis done on 05/13/2019 revealed severe pancreatitis with large pancreatic fluid collection with dimensions of 17.2 x 17.4 x 16.6 cm. Status post IR guided feeding tube placement. No fever or leukocytosis.  Lipase of 26 on 05/16/2019.  Expecting conservative management and slow recovery at this time.  GI following the patient, and has added MS Contin today with clear liquids.  Follow GI recommendations.  Mild sore throat: -Resolved.    Mild sinus tachycardia: -on IV Cardizem 10 mg every 6 hours as needed.   -Likely multifactorial: Patient may likely be intravascularly volume depleted. -1 L normal saline fluid bolus -Optimize pain control -Monitor and optimize volume status -Further management will depend on hospital course.   History of hypertension: -Controlled.   -Continue to monitor closely.   Hypokalemia Replenished.  Hypomagnesemia: -Continue to monitor closely.     Hypophosphatemia.  Replenished.    Acute kidney injury on presentation: -Resolved.     Morbid obesity -BMI 43.4 -Further management on outpatient visit.  Debility and deconditioning. PT recommend CIR  Severe protein calorie malnutrition: -Caloric count  -Dietary team input is appreciated.     VTE Prophylaxis: Lovenox subq  Code Status:  Full code  Family Communication:   Disposition Plan:  Patient is from home. Patient has been seen by physical therapy who recommended CIR.  Likely disposition to CIR in 2 to 3 days. Barriers to discharge  include improvement of severe pancreatitis, still on cortrak tube for feeding, needing IV Dilaudid for abdominal pain.  Consultants:  GI  Interventional radiology  Procedures:  Cortrak feeding tube  placement 05/14/2019  Antibiotics:  Anti-infectives (From admission, onward)   None     Subjective: Patient continues to report abdominal pain. No nausea or vomiting No fever or chills  Objective: Vitals:   05/24/19 0900 05/24/19 1200  BP:    Pulse: (!) 116   Resp: (!) 22   Temp: 98 F (36.7 C) 97.9 F (36.6 C)  SpO2: 95% 95%    Intake/Output Summary (Last 24 hours) at 05/24/2019 1236 Last data filed at 05/24/2019 0900 Gross per 24 hour  Intake 1541 ml  Output 1220 ml  Net 321 ml   Filed Weights   05/22/19 0320 05/23/19 0344 05/24/19 0200  Weight: 134.8 kg (!) 136.1 kg (!) 137.1 kg   Body mass index is 40.99 kg/m.   Physical Exam:  General: Morbidly obese.  Awake and alert.  Not in any real distress. HEENT: Pallor.  No jaundice.  Dry buccal mucosa. Chest:  Clear to auscultation.    CVS: S1 &S2  Abdomen: Morbidly obese, soft and nontender.  Organs are difficult to assess.   CNS: Patient is awake and alert.  Patient moves all extremities.  Extremities: Fullness of the ankle without any overt edema.    Data Review: I have personally reviewed the following laboratory data and studies,  CBC: Recent Labs  Lab 05/19/19 0224 05/20/19 0747 05/21/19 0223 05/22/19 0156 05/24/19 0248  WBC 8.7 8.2 8.1 4.8 7.4  NEUTROABS  --  6.1 6.2 3.8 5.1  HGB 10.1* 9.7* 9.3* 8.9* 8.4*  HCT 31.2* 30.4* 29.5* 28.3* 26.5*  MCV 75.7* 76.2* 76.6* 76.1* 75.7*  PLT 161 186 210 245 282   Basic Metabolic Panel: Recent Labs  Lab 05/18/19 0255 05/18/19 0255 05/19/19 0224 05/20/19 0747 05/21/19 0223 05/22/19 0156 05/24/19 0248  NA 140   < > 139 144 138 141 137  K 4.1   < > 4.2 4.1 4.3 4.3 4.3  CL 105   < > 103 101 102 103 102  CO2 25   < > 27 27 25 26 24   GLUCOSE 196*   < > 191* 114* 127* 226* 207*  BUN 14   < > 16 15 19 17 15   CREATININE 0.77   < > 0.81 0.71 0.83 0.89 0.87  CALCIUM 9.3   < > 9.0 9.1 8.3* 8.4* 8.2*  MG 1.9   < > 1.7 1.5* 1.8 1.7 1.7  PHOS 2.9  --  2.1*  --   3.0  --  3.4   < > = values in this interval not displayed.   Liver Function Tests: Recent Labs  Lab 05/19/19 0224 05/20/19 0747 05/21/19 0223 05/22/19 0156 05/24/19 0248  AST 22 29 38 30 24  ALT 15 23 23 30 29   ALKPHOS 96 94 113 121 123  BILITOT 0.4 0.6 1.1 0.5 0.3  PROT 6.8 6.9 6.7 7.3 6.7  ALBUMIN 1.5* 1.5* 1.4* 1.4* 1.2*   No results for input(s): LIPASE, AMYLASE in the last 168 hours. No results for input(s): AMMONIA in the last 168 hours. Cardiac Enzymes: No results for input(s): CKTOTAL, CKMB, CKMBINDEX, TROPONINI in the last 168 hours. BNP (last 3 results) Recent Labs    04/23/19 0450  BNP 99.2    ProBNP (last 3 results) No results for input(s): PROBNP in the last 8760 hours.  CBG: Recent Labs  Lab 05/23/19 0852 05/23/19 1457 05/23/19 2102 05/24/19 0347 05/24/19 0908  GLUCAP 120* 99 225* 193* 150*   No results found for this or any previous visit (from the past 240 hour(s)).   Studies: DG Abd 2 Views  Result Date: 05/24/2019 CLINICAL DATA:  Abdominal pain. EXAM: ABDOMEN - 2 VIEW COMPARISON:  05/13/2010 and prior studies FINDINGS: A small bore feeding tube is identified with tip overlying the distal duodenum. The bowel gas pattern is unremarkable. No dilated bowel loops are present. No evidence of pneumoperitoneum. IMPRESSION: Unremarkable bowel gas pattern. No evidence of pneumoperitoneum. Electronically Signed   By: Harmon Pier M.D.   On: 05/24/2019 08:04    Scheduled Meds: . allopurinol  300 mg Oral Daily  . chlorhexidine  15 mL Mouth Rinse BID  . enoxaparin (LOVENOX) injection  40 mg Subcutaneous Q24H  . feeding supplement (PRO-STAT SUGAR FREE 64)  30 mL Per Tube TID  . insulin aspart  0-20 Units Subcutaneous Q6H  . insulin aspart  12 Units Subcutaneous Q6H  . insulin glargine  20 Units Subcutaneous QHS  . insulin glargine  45 Units Subcutaneous Daily  . mouth rinse  15 mL Mouth Rinse q12n4p  . morphine  15 mg Oral Q12H  . pantoprazole (PROTONIX)  IV  40 mg Intravenous Q12H    Continuous Infusions: . feeding supplement (VITAL 1.5 CAL) 1,000 mL (05/24/19 0115)    Barnetta Chapel, MD  [facility] 05/24/2019

## 2019-05-25 ENCOUNTER — Inpatient Hospital Stay (HOSPITAL_COMMUNITY): Payer: BC Managed Care – PPO

## 2019-05-25 LAB — GLUCOSE, CAPILLARY
Glucose-Capillary: 158 mg/dL — ABNORMAL HIGH (ref 70–99)
Glucose-Capillary: 47 mg/dL — ABNORMAL LOW (ref 70–99)
Glucose-Capillary: 62 mg/dL — ABNORMAL LOW (ref 70–99)
Glucose-Capillary: 68 mg/dL — ABNORMAL LOW (ref 70–99)
Glucose-Capillary: 88 mg/dL (ref 70–99)
Glucose-Capillary: 110 mg/dL — ABNORMAL HIGH (ref 70–99)
Glucose-Capillary: 170 mg/dL — ABNORMAL HIGH (ref 70–99)

## 2019-05-25 LAB — C DIFFICILE QUICK SCREEN W PCR REFLEX
C Diff antigen: NEGATIVE
C Diff interpretation: NOT DETECTED
C Diff toxin: NEGATIVE

## 2019-05-25 MED ORDER — IBUPROFEN 200 MG PO TABS
600.0000 mg | ORAL_TABLET | Freq: Four times a day (QID) | ORAL | Status: DC | PRN
  Administered 2019-05-25: 600 mg via ORAL
  Filled 2019-05-25: qty 3

## 2019-05-25 MED ORDER — LOPERAMIDE HCL 2 MG PO CAPS
4.0000 mg | ORAL_CAPSULE | Freq: Once | ORAL | Status: AC
  Administered 2019-05-25: 4 mg via ORAL
  Filled 2019-05-25: qty 2

## 2019-05-25 MED ORDER — INSULIN ASPART 100 UNIT/ML ~~LOC~~ SOLN
10.0000 [IU] | Freq: Four times a day (QID) | SUBCUTANEOUS | Status: DC
  Administered 2019-05-25: 10 [IU] via SUBCUTANEOUS

## 2019-05-25 MED ORDER — METRONIDAZOLE IN NACL 5-0.79 MG/ML-% IV SOLN
500.0000 mg | Freq: Three times a day (TID) | INTRAVENOUS | Status: DC
  Administered 2019-05-25 – 2019-06-01 (×21): 500 mg via INTRAVENOUS
  Filled 2019-05-25 (×20): qty 100

## 2019-05-25 MED ORDER — IOHEXOL 300 MG/ML  SOLN
100.0000 mL | Freq: Once | INTRAMUSCULAR | Status: AC | PRN
  Administered 2019-05-25: 100 mL via INTRAVENOUS

## 2019-05-25 MED ORDER — GLUCOSE 40 % PO GEL
ORAL | Status: AC
  Filled 2019-05-25: qty 1

## 2019-05-25 MED ORDER — CIPROFLOXACIN IN D5W 400 MG/200ML IV SOLN
400.0000 mg | Freq: Two times a day (BID) | INTRAVENOUS | Status: DC
  Administered 2019-05-25 – 2019-06-02 (×16): 400 mg via INTRAVENOUS
  Filled 2019-05-25 (×17): qty 200

## 2019-05-25 MED ORDER — GLUCOSE 40 % PO GEL
1.0000 | ORAL | Status: AC
  Administered 2019-05-25: 37.5 g via ORAL

## 2019-05-25 NOTE — Plan of Care (Signed)
  Problem: Education: Goal: Ability to describe self-care measures that may prevent or decrease complications (Diabetes Survival Skills Education) will improve Outcome: Progressing Goal: Individualized Educational Video(s) Outcome: Progressing   Problem: Cardiac: Goal: Ability to maintain an adequate cardiac output will improve Outcome: Progressing   Problem: Health Behavior/Discharge Planning: Goal: Ability to identify and utilize available resources and services will improve Outcome: Progressing Goal: Ability to manage health-related needs will improve Outcome: Progressing   Problem: Fluid Volume: Goal: Ability to achieve a balanced intake and output will improve Outcome: Progressing   Problem: Metabolic: Goal: Ability to maintain appropriate glucose levels will improve Outcome: Progressing   Problem: Nutritional: Goal: Maintenance of adequate nutrition will improve Outcome: Progressing Goal: Maintenance of adequate weight for body size and type will improve Outcome: Progressing   Problem: Respiratory: Goal: Will regain and/or maintain adequate ventilation Outcome: Progressing   Problem: Urinary Elimination: Goal: Ability to achieve and maintain adequate renal perfusion and functioning will improve Outcome: Progressing   Problem: Education: Goal: Knowledge of General Education information will improve Description: Including pain rating scale, medication(s)/side effects and non-pharmacologic comfort measures Outcome: Progressing   Problem: Health Behavior/Discharge Planning: Goal: Ability to manage health-related needs will improve Outcome: Progressing   Problem: Clinical Measurements: Goal: Ability to maintain clinical measurements within normal limits will improve Outcome: Progressing Goal: Will remain free from infection Outcome: Progressing Goal: Diagnostic test results will improve Outcome: Progressing Goal: Respiratory complications will improve Outcome:  Progressing Goal: Cardiovascular complication will be avoided Outcome: Progressing   Problem: Activity: Goal: Risk for activity intolerance will decrease Outcome: Progressing   Problem: Nutrition: Goal: Adequate nutrition will be maintained Outcome: Progressing   Problem: Coping: Goal: Level of anxiety will decrease Outcome: Progressing   Problem: Elimination: Goal: Will not experience complications related to bowel motility Outcome: Progressing Goal: Will not experience complications related to urinary retention Outcome: Progressing   Problem: Pain Managment: Goal: General experience of comfort will improve Outcome: Progressing   Problem: Safety: Goal: Ability to remain free from injury will improve Outcome: Progressing   Problem: Skin Integrity: Goal: Risk for impaired skin integrity will decrease Outcome: Progressing   

## 2019-05-25 NOTE — Progress Notes (Signed)
Caleb Chambers, Caleb Pcp Per   LOS: 13 days   Brief narrative: Chambers is a 54 year old African-American Chambers, Caleb Chambers, Caleb Chambers, Caleb Chambers, Caleb Chambers intake since his discharge.  Chambers reported shortness of breath and decreased ability to eat and drink.  He has not been fully compliant Caleb insulin regimen as well.  Chambers was then admitted to the hospital again for DKA and acute kidney injury, prerenal.  DKA and acute kidney injury have resolved.  Chambers is currently being managed for complications of severe pancreatitis.  Assessment/Plan:  Principal Problem:   DKA (diabetic ketoacidosis) ([facility]) Active Problems:   Acute pancreatitis   Acute kidney injury ([facility])   Morbid obesity ([facility])   Debility   Protein-calorie malnutrition, severe  Severe pancreatitis Caleb peripancreatic fluid collection Caleb concern for necrotizing pancreatitis and questionable superimposed infection Repeat CT abdomen pelvis 05/25/2019 showed persistent pancreatitis Caleb new scattered peripancreatic gas,extensive ill-defined peripancreatic fluid extending throughout transverse colon into the root of mesentery. S/p cholecystectomy -Currently on clear liquid diet and tube feed.   -Pain control with MS Contin twice daily, IV narcotics for breakthrough pain -GI on board.  Appreciate recommendations.  Started on Cipro/Flagyl -New onset diarrhea, stool C. difficile negative.  Likely related to tube feeds or secondary to worsening pancreatitis.  Recurrent DKA -Resolved.   -Blood sugars reasonably controlled.   -Hemoglobin A1c was 13.2 on 04/22/2019  -Chambers is currently on tube feeding due to poor p.o. intake, as well as, clear liquid diet.   -Currently on basal bolus and subcu insulin. On  Lantus to 45 units in am and 20 units at night, subcutaneous NovoLog 12 units every 6 hours and resistant sliding scale insulin coverage. -1 episode of hypoglycemia likely as 2 doses of short-acting insulin were given close together.  Nevertheless, will decrease dose of NovoLog to 10 units every 6 for now.  Sinus inus tachycardia: Likely secondary to systemic inflammatory response syndrome and pain - pain control -Management of pancreatitis as above  History of Chambers: -Controlled.   -Continue to monitor closely.   Hypokalemia Replenished.  Hypomagnesemia: -Continue to monitor closely.     Hypophosphatemia.  Replenished.    Acute kidney injury on presentation: -Resolved.     Morbid obesity -BMI 43.4 -Further management on outpatient visit.  Debility and deconditioning. PT recommend CIR  Severe protein calorie malnutrition: -Caloric count  -Dietary team input is appreciated.     VTE Prophylaxis: Lovenox subq  Code Status:  Full code  Family Communication:   Disposition Plan:  Chambers is from home. Chambers has been seen by physical therapy who recommended CIR.  Likely disposition to CIR when medically stable. Barriers to discharge include improvement of severe pancreatitis, still on cortrak tube for feeding, needing IV Dilaudid for abdominal pain. He is at high risk of decompensation and overall prognosis is guarded.  Consultants:  GI  Interventional radiology  Procedures:  Cortrak feeding tube placement 05/14/2019  Antibiotics:   Anti-infectives (From admission, onward)   Start     Dose/Rate Route Frequency Ordered Stop   05/25/19 1500  ciprofloxacin (CIPRO) IVPB 400 mg     400 mg 200 mL/hr over 60 Minutes Intravenous Every 12 hours 05/25/19 1408     05/25/19 1500  metroNIDAZOLE (FLAGYL) IVPB 500 mg     500 mg  100 mL/hr over 60 Minutes Intravenous Every 8 hours 05/25/19 1408       Subjective: Continues to report abdominal pain which is somewhat  improved with MS Contin. Complains of 3 large loose bowel movements per day.  Objective: Vitals:   05/25/19 0354 05/25/19 1130  BP: 133/80   Pulse: (!) 120   Resp: (!) 21 (!) 23  Temp: 98.5 F (36.9 C) 98.9 F (37.2 C)  SpO2: 100%     Intake/Output Summary (Last 24 hours) at 05/25/2019 1509 Last data filed at 05/25/2019 1400 Gross per 24 hour  Intake 540 ml  Output 750 ml  Net -210 ml   Filed Weights   05/23/19 0344 05/24/19 0200 05/25/19 0354  Weight: (!) 136.1 kg (!) 137.1 kg 135.6 kg   Body mass index is 40.54 kg/m.   Physical Exam:  General: Caleb Chambers.  Awake and alert.  In mild distress, sleeping in left lateral position to avoid abdominal discomfort HEENT: Pallor.  Caleb jaundice.  Dry buccal mucosa. Chest:  Clear to auscultation.    CVS: S1 &S2  Abdomen: Caleb Chambers, soft and nontender.  Bowel sounds present CNS: Chambers is awake and alert.  Chambers moves all extremities.  Extremities: Fullness of the ankle without any overt edema.    Data Review: I have personally reviewed the following laboratory data and studies,  CBC: Recent Labs  Lab 05/19/19 0224 05/20/19 0747 05/21/19 0223 05/22/19 0156 05/24/19 0248  WBC 8.7 8.2 8.1 4.8 7.4  NEUTROABS  --  6.1 6.2 3.8 5.1  HGB 10.1* 9.7* 9.3* 8.9* 8.4*  HCT 31.2* 30.4* 29.5* 28.3* 26.5*  MCV 75.7* 76.2* 76.6* 76.1* 75.7*  PLT 161 186 210 245 258   Basic Metabolic Panel: Recent Labs  Lab 05/19/19 0224 05/20/19 0747 05/21/19 0223 05/22/19 0156 05/24/19 0248  NA 139 144 138 141 137  K 4.2 4.1 4.3 4.3 4.3  CL 103 101 102 103 102  CO2 27 27 25 26 24   GLUCOSE 191* 114* 127* 226* 207*  BUN 16 15 19 17 15   CREATININE 0.81 0.71 0.83 0.89 0.87  CALCIUM 9.0 9.1 8.3* 8.4* 8.2*  MG 1.7 1.5* 1.8 1.7 1.7  PHOS 2.1*  --  3.0  --  3.4   Liver Function Tests: Recent Labs  Lab 05/19/19 0224 05/20/19 0747 05/21/19 0223 05/22/19 0156 05/24/19 0248  AST 22 29 38 30 24  ALT 15 23 23 30 29   ALKPHOS 96 94  113 121 123  BILITOT 0.4 0.6 1.1 0.5 0.3  PROT 6.8 6.9 6.7 7.3 6.7  ALBUMIN 1.5* 1.5* 1.4* 1.4* 1.2*   Caleb results for input(s): LIPASE, AMYLASE in the last 168 hours. Caleb results for input(s): AMMONIA in the last 168 hours. Cardiac Enzymes: Caleb results for input(s): CKTOTAL, CKMB, CKMBINDEX, TROPONINI in the last 168 hours. BNP (last 3 results) Recent Labs    04/23/19 0450  BNP 99.2    ProBNP (last 3 results) Caleb results for input(s): PROBNP in the last 8760 hours.  CBG: Recent Labs  Lab 05/25/19 0357 05/25/19 1126 05/25/19 1212 05/25/19 1300 05/25/19 1418  GLUCAP 158* 47* 62* 68* 88   Recent Results (from the past 240 hour(s))  C difficile quick scan w PCR reflex     Status: None   Collection Time: 05/25/19  8:15 AM   Specimen: STOOL  Result Value Ref Range Status   C Diff antigen NEGATIVE NEGATIVE Final   C Diff toxin NEGATIVE NEGATIVE Final   C  Diff interpretation Caleb C. difficile detected.  Final    Comment: Performed at [facility], [address] [place], [place] [address]     Studies: CT ABDOMEN PELVIS W CONTRAST  Result Date: 05/25/2019 CLINICAL DATA:  Inpatient. Follow-up peripancreatic fluid collection in the setting of acute pancreatitis. EXAM: CT ABDOMEN AND PELVIS Caleb CONTRAST TECHNIQUE: Multidetector CT imaging of the abdomen and pelvis was performed using the standard protocol following bolus administration of intravenous contrast. CONTRAST:  OMNIPAQUE IOHEXOL 300 MG/ML  SOLN COMPARISON:  05/13/2019 CT abdomen/pelvis. FINDINGS: Lower chest: Mild bibasilar atelectasis. Hepatobiliary: Normal liver size. Caleb liver mass. Cholecystectomy. Caleb biliary ductal dilatation. CBD diameter 4 mm. Pancreas: Persistent diffuse extensive peripancreatic fat stranding. There is a lesser sac 14.6 x 8.3 cm fluid collection Caleb new air-fluid level, previously 16.5 x 8.9 cm, mildly decreased in size. There is new scattered peripancreatic gas throughout the widespread  ill-defined peripancreatic fluid extending throughout the transverse mesocolon and into the root of the mesentery, appearing similar in extent. Caleb discrete pancreatic mass or duct dilation. Spleen: Normal size. Caleb mass. Adrenals/Urinary Tract: Normal adrenals. Caleb hydronephrosis. Caleb renal masses. Normal bladder. Stomach/Bowel: Enteric tube terminates in the distal duodenum near the duodenal jejunal junction. Stomach is collapsed Caleb stable extrinsic mass effect on the stomach from the lesser sac fluid collection. Normal caliber small bowel Caleb Caleb small bowel wall thickening. Normal appendix. Mild wall thickening in the distal transverse colon, splenic flexure of the colon and proximal descending colon, probably reactive. Caleb large bowel dilatation or diverticulosis. Vascular/Lymphatic: Normal caliber abdominal aorta. Patent portal, splenic, hepatic and renal veins. Caleb pathologically enlarged lymph nodes in the abdomen or pelvis. Reproductive: Top-normal size prostate. Trace pelvic and paracolic gutter ascites. Other: Caleb pneumoperitoneum, ascites or focal fluid collection. Musculoskeletal: Caleb aggressive appearing focal osseous lesions. Mild thoracolumbar spondylosis. IMPRESSION: 1. Large lesser sac fluid collection Caleb new air-fluid level, mildly decreased in size since 05/13/2019 CT. 2. Persistent findings of pancreatitis Caleb new scattered peripancreatic gas throughout the extensive ill-defined peripancreatic fluid extending throughout the transverse mesocolon and into the root of the mesentery, worrisome for necrotizing pancreatitis. 3. Mild wall thickening in the distal transverse colon, splenic flexure of the colon and proximal descending colon, probably reactive. 4. Trace pelvic and paracolic gutter ascites. 5. Enteric tube terminates in the distal duodenum near the duodenal jejunal junction. Electronically Signed   By: Delbert Phenix M.D.   On: 05/25/2019 09:45   DG Abd 2 Views  Result Date:  05/24/2019 CLINICAL DATA:  Abdominal pain. EXAM: ABDOMEN - 2 VIEW COMPARISON:  05/13/2010 and prior studies FINDINGS: A small bore feeding tube is identified Caleb tip overlying the distal duodenum. The bowel gas pattern is unremarkable. Caleb dilated bowel loops are present. Caleb evidence of pneumoperitoneum. IMPRESSION: Unremarkable bowel gas pattern. Caleb evidence of pneumoperitoneum. Electronically Signed   By: Harmon Pier M.D.   On: 05/24/2019 08:04    Scheduled Meds: . allopurinol  300 mg Chambers Daily  . chlorhexidine  15 mL Mouth Rinse BID  . dextrose      . enoxaparin (LOVENOX) injection  40 mg Subcutaneous Q24H  . feeding supplement (PRO-STAT SUGAR FREE 64)  30 mL Per Tube TID  . insulin aspart  0-20 Units Subcutaneous Q6H  . insulin aspart  12 Units Subcutaneous Q6H  . insulin glargine  20 Units Subcutaneous QHS  . insulin glargine  45 Units Subcutaneous Daily  . mouth rinse  15 mL Mouth Rinse q12n4p  .  morphine  15 mg Chambers Q12H  . pantoprazole (PROTONIX) IV  40 mg Intravenous Q12H    Continuous Infusions: . ciprofloxacin    . feeding supplement (VITAL 1.5 CAL) 1,000 mL (05/25/19 0957)  . metronidazole      Liborio Nixon, MD  [facility] 05/25/2019

## 2019-05-25 NOTE — Progress Notes (Signed)
Pt c/o frequent loose stools all day long, pt has has 3 loose stools since start of shift (1900). Pt requesting something for diarrhea, o/c ([place]) notified, new orders received. Meds given (see MAR), will monitor for effectiveness

## 2019-05-25 NOTE — Progress Notes (Signed)
Inpatient Diabetes Program Recommendations  AACE/ADA: New Consensus Statement on Inpatient Glycemic Control (2015)  Target Ranges:  Prepandial:   less than 140 mg/dL      Peak postprandial:   less than 180 mg/dL (1-2 hours)      Critically ill patients:  140 - 180 mg/dL   Lab Results  Component Value Date   GLUCAP 88 05/25/2019   HGBA1C 13.2 (H) 04/22/2019    Review of Glycemic Control  Results for KAMDON, REISIG (MRN [id number]) as of 05/25/2019 14:26  Ref. Range 05/25/2019 03:57 05/25/2019 0849 05/25/2019 11:26 05/25/2019 12:12 05/25/2019 13:00 05/25/2019 14:18  Glucose-Capillary Latest Ref Range: 70 - 99 mg/dL 628 (H) NOVOLOG 36OQHUT @ 0521 No CBG noted NOVOLOG 16 units  47 (L) 62 (L) 68 (L) 88    Diabetes history: DM2  Current orders for Inpatient glycemic control: Novolog 12 units Q6H + Novolog 0-20 units Q6H + Lantus 20 units QHS + Lantus 45 units QAM  Noted: Patient became hypoglycemic this afternoon 47mg /dl, 62mg /dl, 68mg /dl.  Patients CBG was 158 this morning at 0357, was given 16 units of Novolog at 0521.  Patient was administered Novolog 16 units at 0849.  No CBG noted.  Patient then had hypoglycemic.  Spoke with April, RN and sent secure chat to Dr. .    Thank you, , RN, BSN Diabetes Coordinator Inpatient Diabetes Program [phone] (team pager from 8a-5p)

## 2019-05-25 NOTE — Plan of Care (Signed)
  Problem: Cardiac: Goal: Ability to maintain an adequate cardiac output will improve Outcome: Progressing   Problem: Fluid Volume: Goal: Ability to achieve a balanced intake and output will improve Outcome: Progressing   Problem: Metabolic: Goal: Ability to maintain appropriate glucose levels will improve Outcome: Progressing

## 2019-05-25 NOTE — Progress Notes (Addendum)
Subjective: Headache and diarrhea  Objective: Vital signs in last 24 hours: Temp:  [97.7 F (36.5 C)-98.9 F (37.2 C)] 98.5 F (36.9 C) (02/08 0354) Pulse Rate:  [116-120] 120 (02/08 0354) Resp:  [16-26] 21 (02/08 0354) BP: (100-133)/(59-80) 133/80 (02/08 0354) SpO2:  [95 %-100 %] 100 % (02/08 0354) Weight:  [135.6 kg] 135.6 kg (02/08 0354) Last BM Date: 05/25/19  Intake/Output from previous day: 02/07 0701 - 02/08 0700 In: 720 [P.O.:720] Out: 1450 [Urine:1450] Intake/Output this shift: No intake/output data recorded.  General appearance: alert and mild distress GI: soft, non-tender; bowel sounds normal; no masses,  no organomegaly  Lab Results: Recent Labs    05/24/19 0248  WBC 7.4  HGB 8.4*  HCT 26.5*  PLT 282   BMET Recent Labs    05/24/19 0248  NA 137  K 4.3  CL 102  CO2 24  GLUCOSE 207*  BUN 15  CREATININE 0.87  CALCIUM 8.2*   LFT Recent Labs    05/24/19 0248  PROT 6.7  ALBUMIN 1.2*  AST 24  ALT 29  ALKPHOS 123  BILITOT 0.3   PT/INR No results for input(s): LABPROT, INR in the last 72 hours. Hepatitis Panel No results for input(s): HEPBSAG, HCVAB, HEPAIGM, HEPBIGM in the last 72 hours. C-Diff No results for input(s): CDIFFTOX in the last 72 hours. Fecal Lactopherrin No results for input(s): FECLLACTOFRN in the last 72 hours.  Studies/Results: DG Abd 2 Views  Result Date: 05/24/2019 CLINICAL DATA:  Abdominal pain. EXAM: ABDOMEN - 2 VIEW COMPARISON:  05/13/2010 and prior studies FINDINGS: A small bore feeding tube is identified with tip overlying the distal duodenum. The bowel gas pattern is unremarkable. No dilated bowel loops are present. No evidence of pneumoperitoneum. IMPRESSION: Unremarkable bowel gas pattern. No evidence of pneumoperitoneum. Electronically Signed   By: Harmon Pier M.D.   On: 05/24/2019 08:04    Medications:  Scheduled: . allopurinol  300 mg Oral Daily  . chlorhexidine  15 mL Mouth Rinse BID  . enoxaparin (LOVENOX)  injection  40 mg Subcutaneous Q24H  . feeding supplement (PRO-STAT SUGAR FREE 64)  30 mL Per Tube TID  . insulin aspart  0-20 Units Subcutaneous Q6H  . insulin aspart  12 Units Subcutaneous Q6H  . insulin glargine  20 Units Subcutaneous QHS  . insulin glargine  45 Units Subcutaneous Daily  . mouth rinse  15 mL Mouth Rinse q12n4p  . morphine  15 mg Oral Q12H  . pantoprazole (PROTONIX) IV  40 mg Intravenous Q12H   Continuous: . feeding supplement (VITAL 1.5 CAL) 1,000 mL (05/24/19 1552)    Assessment/Plan: 1) Severe acute pancreatitis. 2) Fluid collection. 3) Diarrhea. 4) Severe malnutrition. 5) Persistent tachycardia.   The patient complains about having an headache, which is unusual for him.  He states that he never suffers with headaches.  Tylenol was administered early this AM, but it has no effect.  He will be tried on ibuprofen.  Since his admission, tachycardia has persisted.  His pain medication administration has declined as a result of side effects.  It may be that his persistent abdominal pain is from his fluid collection/evolving pseudocyst.  Dr. Meridee Score covered him this weekend, and after discussion, the patient will benefit from a repeat abdominal scan.  Potentially the fluid collection can be drained.  Diarrhea is a new issue for him and it will be prudent to check for C. Diff, but this maybe from his tube feeding, although the rate was not increased.  His free water infusion has helped with his dry mouth.  Plan: 1) CT ABM/Pelvis today. 2) Check for C. Diff. 3) Ibuprofen for headache.  ADDENDUM:  The CT scan shows that there is gas in the cyst, which is consistent with an infection.  He was started on Cipro/Flagyl as he is PCN allergic.  LOS: 13 days   Breanne Olvera D 05/25/2019, 8:07 AM

## 2019-05-25 NOTE — Progress Notes (Signed)
OT Cancellation Note  Patient Details Name: Caleb Chambers MRN: [id number] DOB: 02-Aug-1965   Cancelled Treatment:    Reason Eval/Treat Not Completed: Patient declined, no reason specified. Second attempt, pt continues to state he is fatigued and has a headache. Will return as schedule allows.  Melda Mermelstein M Makaela Cando Octavius Shin MSOT, OTR/L Acute Rehab Pager: [phone] Office: [phone] 05/25/2019, 3:02 PM

## 2019-05-25 NOTE — Progress Notes (Signed)
OT Cancellation Note  Patient Details Name: Caleb Chambers MRN: [id number] DOB: December 07, 1965   Cancelled Treatment:    Reason Eval/Treat Not Completed: Patient declined, no reason specified(Significant headache after CT scan. Will return as schedule allows.)  Kennette Cuthrell M Lyfe Reihl Katelee Schupp MSOT, OTR/L Acute Rehab Pager: [phone] Office: [phone] 05/25/2019, 9:33 AM

## 2019-05-25 NOTE — Progress Notes (Signed)
PT Cancellation Note  Patient Details Name: Caleb Chambers MRN: [id number] DOB: [birth year]   Cancelled Treatment:    Reason Eval/Treat Not Completed: Patient declined, no reason specified  Attempted to see pt X 2 today. Pt declined due to c/o headache feeling like he has "no energy". PT will continue to follow acutely.    Erline Levine, PTA Acute Rehabilitation Services Pager: [phone] Office: [phone]   05/25/2019, 2:30 PM

## 2019-05-26 LAB — COMPREHENSIVE METABOLIC PANEL
ALT: 22 U/L (ref 0–44)
AST: 18 U/L (ref 15–41)
Albumin: 1.1 g/dL — ABNORMAL LOW (ref 3.5–5.0)
Alkaline Phosphatase: 92 U/L (ref 38–126)
Anion gap: 10 (ref 5–15)
BUN: 11 mg/dL (ref 6–20)
CO2: 24 mmol/L (ref 22–32)
Calcium: 7.8 mg/dL — ABNORMAL LOW (ref 8.9–10.3)
Chloride: 100 mmol/L (ref 98–111)
Creatinine, Ser: 0.9 mg/dL (ref 0.61–1.24)
GFR calc Af Amer: >60 mL/min (ref >60)
GFR calc non Af Amer: >60 mL/min (ref >60)
Glucose, Bld: 125 mg/dL — ABNORMAL HIGH (ref 70–99)
Potassium: 4 mmol/L (ref 3.5–5.1)
Sodium: 134 mmol/L — ABNORMAL LOW (ref 135–145)
Total Bilirubin: 0.3 mg/dL (ref 0.3–1.2)
Total Protein: 7 g/dL (ref 6.5–8.1)

## 2019-05-26 LAB — CBC WITH DIFFERENTIAL/PLATELET
Abs Immature Granulocytes: 0.08 10*3/uL — ABNORMAL HIGH (ref 0.00–0.07)
Basophils Absolute: 0.1 10*3/uL (ref 0.0–0.1)
Basophils Relative: 1 %
Eosinophils Absolute: 0.1 10*3/uL (ref 0.0–0.5)
Eosinophils Relative: 1 %
HCT: 26.6 % — ABNORMAL LOW (ref 39.0–52.0)
Hemoglobin: 8.4 g/dL — ABNORMAL LOW (ref 13.0–17.0)
Immature Granulocytes: 1 %
Lymphocytes Relative: 24 %
Lymphs Abs: 1.5 10*3/uL (ref 0.7–4.0)
MCH: 23.8 pg — ABNORMAL LOW (ref 26.0–34.0)
MCHC: 31.6 g/dL (ref 30.0–36.0)
MCV: 75.4 fL — ABNORMAL LOW (ref 80.0–100.0)
Monocytes Absolute: 0.5 10*3/uL (ref 0.1–1.0)
Monocytes Relative: 8 %
Neutro Abs: 4 10*3/uL (ref 1.7–7.7)
Neutrophils Relative %: 65 %
Platelets: 278 10*3/uL (ref 150–400)
RBC: 3.53 MIL/uL — ABNORMAL LOW (ref 4.22–5.81)
RDW: 15.7 % — ABNORMAL HIGH (ref 11.5–15.5)
WBC: 6.2 10*3/uL (ref 4.0–10.5)
nRBC: 1.1 % — ABNORMAL HIGH (ref 0.0–0.2)

## 2019-05-26 LAB — GLUCOSE, CAPILLARY
Glucose-Capillary: 90 mg/dL (ref 70–99)
Glucose-Capillary: 176 mg/dL — ABNORMAL HIGH (ref 70–99)
Glucose-Capillary: 166 mg/dL — ABNORMAL HIGH (ref 70–99)
Glucose-Capillary: 191 mg/dL — ABNORMAL HIGH (ref 70–99)
Glucose-Capillary: 229 mg/dL — ABNORMAL HIGH (ref 70–99)
Glucose-Capillary: 214 mg/dL — ABNORMAL HIGH (ref 70–99)

## 2019-05-26 MED ORDER — INSULIN GLARGINE 100 UNIT/ML ~~LOC~~ SOLN
14.0000 [IU] | Freq: Every day | SUBCUTANEOUS | Status: DC
  Administered 2019-05-26: 14 [IU] via SUBCUTANEOUS
  Filled 2019-05-26 (×2): qty 0.14

## 2019-05-26 MED ORDER — INSULIN GLARGINE 100 UNIT/ML ~~LOC~~ SOLN
38.0000 [IU] | Freq: Every day | SUBCUTANEOUS | Status: DC
  Administered 2019-05-26: 10:00:00 38 [IU] via SUBCUTANEOUS
  Filled 2019-05-26 (×2): qty 0.38

## 2019-05-26 MED ORDER — MORPHINE SULFATE ER 15 MG PO TBCR
30.0000 mg | EXTENDED_RELEASE_TABLET | Freq: Two times a day (BID) | ORAL | Status: DC
  Administered 2019-05-26 – 2019-06-20 (×41): 30 mg via ORAL
  Filled 2019-05-26 (×46): qty 2

## 2019-05-26 MED ORDER — INSULIN ASPART 100 UNIT/ML ~~LOC~~ SOLN
6.0000 [IU] | Freq: Four times a day (QID) | SUBCUTANEOUS | Status: DC
  Administered 2019-05-26 – 2019-05-27 (×3): 6 [IU] via SUBCUTANEOUS

## 2019-05-26 NOTE — Progress Notes (Signed)
Nutrition Follow up   DOCUMENTATION CODES:   Severe malnutrition in context of acute illness/injury  INTERVENTION:   Continue tube feeding:  -Vital 1.5 @ 60 ml/hr via Cortrak (1440 ml)  -30 ml Prostat TID  Provides: 2460 kcals, 142 grams protein, 1100 ml free water.   NUTRITION DIAGNOSIS:   Severe Malnutrition related to acute illness(pancreatitis) as evidenced by energy intake < or equal to 50% for > or equal to 5 days, moderate fat depletion, mild fat depletion, moderate muscle depletion, mild muscle depletion.  Ongoing  GOAL:   Patient will meet greater than or equal to 90% of their needs  Addressed via TF  MONITOR:   TF tolerance, Weight trends, Labs, I & O's  REASON FOR ASSESSMENT:   Consult Assessment of nutrition requirement/status  ASSESSMENT:   Pt with a PMH significant for HTN, DM, gout, and recent admission for pancreatitis and DKA presented to ED with DKA, abdominal pain, SOB, and minimal PO intake since last admission.   1/28- s/p post pyloric small bore feeding tube placement (reads at the LOT)  Pt with possible fluid collection through a fistula. Endoscopic drainage planned Thursday.  Abdominal pain continues. Per RN, pt drinking ice water and not eating much off trays.  Meal completions charted as 0-10% for his last eight meals. Tolerating tube feeding at goal. Pt currently receiving most calorically dense formula with Prostat. Unable to adjust rate any lower until PO intake increases as pt is severely malnourished. Pt had hypoglycemic episode yesterday morning. Insulin to be adjusted per diabetes coordination. Continue TF at current rate until diet can advance.   Admission weight: 133.3 kg  Current weight: 136.9 kg   I/O: +14, 878 ml since admit  UOP: 250 ml x 24 hrs   Medications: SS novolog, lantus Labs: Na 134 (L) CBG 62-166  Diet Order:   Diet Order            Diet clear liquid Room service appropriate? Yes; Fluid consistency: Thin  Diet  effective now              EDUCATION NEEDS:   Not appropriate for education at this time  Skin:  Skin Assessment: Reviewed RN Assessment  Last BM:  2/9  Height:   Ht Readings from Last 1 Encounters:  05/12/19 6' (1.829 m)    Weight:   Wt Readings from Last 1 Encounters:  05/26/19 (!) 136.9 kg    Ideal Body Weight:  80.9 kg  BMI:  Body mass index is 40.93 kg/m.  Estimated Nutritional Needs:   Kcal:  2300-2500  Protein:  130-145 grams  Fluid:  >2L/d  Vanessa Kick RD, LDN Clinical Nutrition Pager # [phone]

## 2019-05-26 NOTE — Progress Notes (Signed)
Subjective: No new complaints.  Pain is tolerable.  Objective: Vital signs in last 24 hours: Temp:  [97.6 F (36.4 C)-99 F (37.2 C)] 99 F (37.2 C) (02/09 0338) Pulse Rate:  [109-115] 115 (02/09 0338) Resp:  [14-23] 14 (02/09 0338) BP: (118-122)/(66-74) 122/72 (02/09 0338) SpO2:  [99 %] 99 % (02/09 0338) Weight:  [136.9 kg] 136.9 kg (02/09 0338) Last BM Date: 05/25/19  Intake/Output from previous day: 02/08 0701 - 02/09 0700 In: 1220 [P.O.:560; NG/GT:660] Out: 250 [Urine:250] Intake/Output this shift: No intake/output data recorded.  General appearance: alert, no distress and weak GI: soft, non-tender; bowel sounds normal; no masses,  no organomegaly  Lab Results: Recent Labs    05/24/19 0248 05/26/19 0509  WBC 7.4 6.2  HGB 8.4* 8.4*  HCT 26.5* 26.6*  PLT 282 278   BMET Recent Labs    05/24/19 0248 05/26/19 0509  NA 137 134*  K 4.3 4.0  CL 102 100  CO2 24 24  GLUCOSE 207* 125*  BUN 15 11  CREATININE 0.87 0.90  CALCIUM 8.2* 7.8*   LFT Recent Labs    05/26/19 0509  PROT 7.0  ALBUMIN 1.1*  AST 18  ALT 22  ALKPHOS 92  BILITOT 0.3   PT/INR No results for input(s): LABPROT, INR in the last 72 hours. Hepatitis Panel No results for input(s): HEPBSAG, HCVAB, HEPAIGM, HEPBIGM in the last 72 hours. C-Diff Recent Labs    05/25/19 0815  CDIFFTOX NEGATIVE   Fecal Lactopherrin No results for input(s): FECLLACTOFRN in the last 72 hours.  Studies/Results: CT ABDOMEN PELVIS W CONTRAST  Result Date: 05/25/2019 CLINICAL DATA:  Inpatient. Follow-up peripancreatic fluid collection in the setting of acute pancreatitis. EXAM: CT ABDOMEN AND PELVIS WITH CONTRAST TECHNIQUE: Multidetector CT imaging of the abdomen and pelvis was performed using the standard protocol following bolus administration of intravenous contrast. CONTRAST:  OMNIPAQUE IOHEXOL 300 MG/ML  SOLN COMPARISON:  05/13/2019 CT abdomen/pelvis. FINDINGS: Lower chest: Mild bibasilar atelectasis.  Hepatobiliary: Normal liver size. No liver mass. Cholecystectomy. No biliary ductal dilatation. CBD diameter 4 mm. Pancreas: Persistent diffuse extensive peripancreatic fat stranding. There is a lesser sac 14.6 x 8.3 cm fluid collection with new air-fluid level, previously 16.5 x 8.9 cm, mildly decreased in size. There is new scattered peripancreatic gas throughout the widespread ill-defined peripancreatic fluid extending throughout the transverse mesocolon and into the root of the mesentery, appearing similar in extent. No discrete pancreatic mass or duct dilation. Spleen: Normal size. No mass. Adrenals/Urinary Tract: Normal adrenals. No hydronephrosis. No renal masses. Normal bladder. Stomach/Bowel: Enteric tube terminates in the distal duodenum near the duodenal jejunal junction. Stomach is collapsed with stable extrinsic mass effect on the stomach from the lesser sac fluid collection. Normal caliber small bowel with no small bowel wall thickening. Normal appendix. Mild wall thickening in the distal transverse colon, splenic flexure of the colon and proximal descending colon, probably reactive. No large bowel dilatation or diverticulosis. Vascular/Lymphatic: Normal caliber abdominal aorta. Patent portal, splenic, hepatic and renal veins. No pathologically enlarged lymph nodes in the abdomen or pelvis. Reproductive: Top-normal size prostate. Trace pelvic and paracolic gutter ascites. Other: No pneumoperitoneum, ascites or focal fluid collection. Musculoskeletal: No aggressive appearing focal osseous lesions. Mild thoracolumbar spondylosis. IMPRESSION: 1. Large lesser sac fluid collection with new air-fluid level, mildly decreased in size since 05/13/2019 CT. 2. Persistent findings of pancreatitis with new scattered peripancreatic gas throughout the extensive ill-defined peripancreatic fluid extending throughout the transverse mesocolon and into the root of the  mesentery, worrisome for necrotizing pancreatitis. 3.  Mild wall thickening in the distal transverse colon, splenic flexure of the colon and proximal descending colon, probably reactive. 4. Trace pelvic and paracolic gutter ascites. 5. Enteric tube terminates in the distal duodenum near the duodenal jejunal junction. Electronically Signed   By: Ilona Sorrel M.D.   On: 05/25/2019 09:45   DG Abd 2 Views  Result Date: 05/24/2019 CLINICAL DATA:  Abdominal pain. EXAM: ABDOMEN - 2 VIEW COMPARISON:  05/13/2010 and prior studies FINDINGS: A small bore feeding tube is identified with tip overlying the distal duodenum. The bowel gas pattern is unremarkable. No dilated bowel loops are present. No evidence of pneumoperitoneum. IMPRESSION: Unremarkable bowel gas pattern. No evidence of pneumoperitoneum. Electronically Signed   By: Margarette Canada M.D.   On: 05/24/2019 08:04    Medications:  Scheduled: . allopurinol  300 mg Oral Daily  . chlorhexidine  15 mL Mouth Rinse BID  . enoxaparin (LOVENOX) injection  40 mg Subcutaneous Q24H  . feeding supplement (PRO-STAT SUGAR FREE 64)  30 mL Per Tube TID  . insulin aspart  0-20 Units Subcutaneous Q6H  . insulin aspart  10 Units Subcutaneous Q6H  . insulin glargine  20 Units Subcutaneous QHS  . insulin glargine  45 Units Subcutaneous Daily  . mouth rinse  15 mL Mouth Rinse q12n4p  . morphine  15 mg Oral Q12H  . pantoprazole (PROTONIX) IV  40 mg Intravenous Q12H   Continuous: . ciprofloxacin 400 mg (05/26/19 0349)  . feeding supplement (VITAL 1.5 CAL) 1,000 mL (05/25/19 0957)  . metronidazole 500 mg (05/26/19 8469)    Assessment/Plan: 1) Severe acute pancreatitis. 2) ? Infected pancreatic fluid collection. 3) Severe malnutrition.   He is clinically stable.  There is no evidence of fever or elevation in his WBC.  The case was discussed with Dr. Rush Landmark with regards to the most recent CT scan findings.  The gas suggests an infection and/or drainage of the fluid collection through a fistula.  He remains tachycardic  and weak.  Cipro and Flagyl were started as he is PCN allergic - anaphylaxis.  He is C. Diff negative.  It may be that his diarrhea is as a result of the tube feeding.  His albumin was worsened to 1.1, presumably as a result of the pancreatic infection.  Plan: 1) Endoscopic drainage with Dr. Rush Landmark on Thursday. 2) Continue with tube feeding.  ? Dose adjustment. 3) Continue with pain management.  LOS: 14 days   Pasqualina Colasurdo D 05/26/2019, 7:03 AM

## 2019-05-26 NOTE — Progress Notes (Signed)
Physical Therapy Treatment Patient Details Name: Caleb Chambers MRN: [id number] DOB: 14-Mar-1966 Today's Date: 05/26/2019    History of Present Illness Pt is a 54 year old man admitted 05/12/19 with N/V, abdominal pain and DKA. Pt discharged from [facility] on 04/29/19 with DKA and pancreatitis. He has not been able to eat much since his discharge. PMH: morbid obesity, HTN, gout, Crohn's disease, IDDM.     PT Comments    Patient making steady progress with therapy and able to tolerate increased session length with OT/PT dove-tailed sessions today. Pt able to ambulate increased distance with RW, HR reaching max of 140 bpm. Pt continues to require cues for posture and step pattern to improve balance and increase BOS; min assist throughout to steady. Patient will continue to benefit from skilled acute PT interventions to progress functional mobility and progress towards PLOF. Continue to recommend CIR for intense PT follow up. Acute PT will continue to progress pt as able throughout acute stay.   Follow Up Recommendations  CIR;Supervision/Assistance - 24 hour     Equipment Recommendations  Rolling walker with 5" wheels    Recommendations for Other Services       Precautions / Restrictions Precautions Precautions: Fall Precaution Comments: watch HR, Cortrak Restrictions Weight Bearing Restrictions: No    Mobility  Bed Mobility Overal bed mobility: Needs Assistance Bed Mobility: Supine to Sit     Supine to sit: Min guard     General bed mobility comments: Pt OOB with OT at start of session, sitting on BSC for rest break  Transfers Overall transfer level: Needs assistance Equipment used: Rolling walker (2 wheeled) Transfers: Sit to/from Stand Sit to Stand: Min guard;Min assist         General transfer comment: min guard for safety and light assist to steady with rising from [facility] to RW  Ambulation/Gait Ambulation/Gait assistance: Min assist;+2 safety/equipment(chair follow) Gait  Distance (Feet): 130 Feet(1 seated rest break at halfway point) Assistive device: Rolling walker (2 wheeled) Gait Pattern/deviations: Step-through pattern;Decreased stride length;Narrow base of support;Scissoring Gait velocity: decreased   General Gait Details: pt continues with narrow BOS during gait and 2x scissoring step pattern with Rt LE crossing over Lt. verbal/visual cues provided to facilitate wider BOS and pt able to correct for ~ 5 steps but unable to sustain new pattern. Pt's HR reached max of 140 bpm during gait.   Stairs             Wheelchair Mobility    Modified Rankin (Stroke Patients Only)       Balance Overall balance assessment: Needs assistance Sitting-balance support: Feet supported;No upper extremity supported Sitting balance-Leahy Scale: Fair     Standing balance support: During functional activity Standing balance-Leahy Scale: Poor Standing balance comment: pt reliant on UE support to steady with gait on level surface         Cognition Arousal/Alertness: Awake/alert Behavior During Therapy: WFL for tasks assessed/performed;Flat affect Overall Cognitive Status: Impaired/Different from baseline Area of Impairment: Following commands;Problem solving        Following Commands: Follows one step commands with increased time;Follows multi-step commands inconsistently;Follows multi-step commands with increased time     Problem Solving: Slow processing;Requires verbal cues General Comments: Pt requiring increased time and cues. More flat today.      Exercises      General Comments General comments (skin integrity, edema, etc.): HR elevating to 150-160s      Pertinent Vitals/Pain Pain Assessment: Faces Faces Pain Scale: Hurts little more  Pain Location: abdomen Pain Descriptors / Indicators: Constant;Discomfort Pain Intervention(s): Limited activity within patient's tolerance;Monitored during session;Repositioned           PT Goals  (current goals can now be found in the care plan section) Acute Rehab PT Goals Patient Stated Goal: feel better PT Goal Formulation: With patient Time For Goal Achievement: 05/27/19 Potential to Achieve Goals: Good Progress towards PT goals: Progressing toward goals    Frequency    Min 3X/week      PT Plan Current plan remains appropriate       AM-PAC PT "6 Clicks" Mobility   Outcome Measure  Help needed turning from your back to your side while in a flat bed without using bedrails?: A Little Help needed moving from lying on your back to sitting on the side of a flat bed without using bedrails?: A Little Help needed moving to and from a bed to a chair (including a wheelchair)?: A Little Help needed standing up from a chair using your arms (e.g., wheelchair or bedside chair)?: A Little Help needed to walk in hospital room?: A Little Help needed climbing 3-5 steps with a railing? : A Lot 6 Click Score: 17    End of Session Equipment Utilized During Treatment: Gait belt Activity Tolerance: Patient tolerated treatment well Patient left: in chair;with call bell/phone within reach Nurse Communication: Mobility status PT Visit Diagnosis: Unsteadiness on feet (R26.81);Muscle weakness (generalized) (M62.81);Difficulty in walking, not elsewhere classified (R26.2);Pain Pain - part of body: (abdomen)     Time: [id number] PT Time Calculation (min) (ACUTE ONLY): 18 min  Charges:  $Gait Training: 8-22 mins                     Wynn Maudlin, DPT Physical Therapist with [facility] [phone]  05/26/2019 11:32 AM

## 2019-05-26 NOTE — Progress Notes (Signed)
Occupational Therapy Treatment Patient Details Name: Caleb Chambers MRN: [id number] DOB: [birth year] Today's Date: 05/26/2019    History of present illness Pt is a 54 year old man admitted 05/12/19 with N/V, abdominal pain and DKA. Pt discharged from [facility] on 04/29/19 with DKA and pancreatitis. He has not been able to eat much since his discharge. PMH: morbid obesity, HTN, gout, Crohn's disease, IDDM.    OT comments  Pt progressing towards established OT goals and motivated to participate in therapy despite pain and fatigue. Providing education on AE for donning socks; pt donning his socks with Min Guard A and Min cues for sequencing and problem solving. Pt performing oral care at standing at sink with Min Guard A. HR elevating to 150-160; cueing for seated rest break. Pt continues to present with poor activity tolerance impacting his safe performance of ADLs. Dove tail with PT for mobility in hallway and to increase tolerance for longer session times. Continue to recommend dc to CIR for intensive OT and will continue to follow acutely as admitted.    Follow Up Recommendations  CIR    Equipment Recommendations  3 in 1 bedside commode    Recommendations for Other Services      Precautions / Restrictions Precautions Precautions: Fall Precaution Comments: watch HR, Cortrak Restrictions Weight Bearing Restrictions: No       Mobility Bed Mobility Overal bed mobility: Needs Assistance Bed Mobility: Supine to Sit     Supine to sit: Min guard     General bed mobility comments: Min Guard A for safety  Transfers Overall transfer level: Needs assistance Equipment used: Rolling walker (2 wheeled) Transfers: Sit to/from Stand Sit to Stand: Min guard         General transfer comment: Min Guard A for safety    Balance Overall balance assessment: Needs assistance Sitting-balance support: Feet supported;No upper extremity supported Sitting balance-Leahy Scale: Fair     Standing  balance support: During functional activity Standing balance-Leahy Scale: Poor Standing balance comment: props with one hand on sink in static standing, RW and min assist for dynamic                           ADL either performed or assessed with clinical judgement   ADL Overall ADL's : Needs assistance/impaired     Grooming: Oral care;Standing;Min guard;Sitting Grooming Details (indicate cue type and reason): Pt standing to complete oral care with Min Guard A. After ~30 seconds, pt leaning heavily on sink with BUEs for support due to fatigue. Pt HR elevating to 150-160s at end of oral care; curing pt to sit at [employer] for seated rest break; HR lowering.             Lower Body Dressing: Min guard;Sit to/from stand;With adaptive equipment;Cueing for sequencing Lower Body Dressing Details (indicate cue type and reason): Pt with pain and difficulty bending forward; pain at hips and abdomen. Educating pt on use of socks with AE. Pt donning socks with sock aide and cues for sequencing.  Toilet Transfer: Education administrator Details (indicate cue type and reason): Min Guard A for safety         Functional mobility during ADLs: Rolling walker;Min guard General ADL Comments: Pt presenting with decreased acitivty tolerance as seen by elevated HR and fatigue.     Vision       Perception     Praxis      Cognition Arousal/Alertness: Awake/alert Behavior  During Therapy: WFL for tasks assessed/performed;Flat affect Overall Cognitive Status: Impaired/Different from baseline Area of Impairment: Following commands;Problem solving                       Following Commands: Follows one step commands with increased time;Follows multi-step commands inconsistently     Problem Solving: Slow processing;Requires verbal cues General Comments: Pt requiring increased time and cues. More flat today.        Exercises     Shoulder Instructions        General Comments HR elevating to 150-160s    Pertinent Vitals/ Pain       Pain Assessment: Faces Faces Pain Scale: Hurts little more Pain Location: abdomen Pain Descriptors / Indicators: Constant;Discomfort Pain Intervention(s): Monitored during session;Limited activity within patient's tolerance;Repositioned  Home Living                                          Prior Functioning/Environment              Frequency  Min 2X/week        Progress Toward Goals  OT Goals(current goals can now be found in the care plan section)  Progress towards OT goals: Progressing toward goals  Acute Rehab OT Goals Patient Stated Goal: feel better OT Goal Formulation: With patient Time For Goal Achievement: 05/27/19 Potential to Achieve Goals: Good ADL Goals Pt Will Perform Grooming: with supervision;standing Pt Will Perform Upper Body Bathing: with set-up;sitting Pt Will Perform Lower Body Bathing: with supervision;sit to/from stand Pt Will Perform Upper Body Dressing: with set-up;sitting Pt Will Perform Lower Body Dressing: with supervision;sit to/from stand Pt Will Transfer to Toilet: with supervision Pt Will Perform Toileting - Clothing Manipulation and hygiene: with supervision;sit to/from stand  Plan Discharge plan remains appropriate    Co-evaluation    PT/OT/SLP Co-Evaluation/Treatment: Yes(Dove tai lto increase activity tolerance)     OT goals addressed during session: ADL's and self-care      AM-PAC OT "6 Clicks" Daily Activity     Outcome Measure   Help from another person eating meals?: None Help from another person taking care of personal grooming?: A Little Help from another person toileting, which includes using toliet, bedpan, or urinal?: A Lot Help from another person bathing (including washing, rinsing, drying)?: A Lot Help from another person to put on and taking off regular upper body clothing?: A Little Help from another person to put  on and taking off regular lower body clothing?: A Little 6 Click Score: 17    End of Session Equipment Utilized During Treatment: Gait belt;Rolling walker  OT Visit Diagnosis: Unsteadiness on feet (R26.81);Pain;Muscle weakness (generalized) (M62.81) Pain - Right/Left: (bilateral) Pain - part of body: Hip(abdomen)   Activity Tolerance Patient tolerated treatment well   Patient Left in chair;with call bell/phone within reach   Nurse Communication Mobility status        Time: [id number] OT Time Calculation (min): 27 min  Charges: OT General Charges $OT Visit: 1 Visit OT Treatments $Self Care/Home Management : 23-37 mins  [place], OTR/L Acute Rehab Pager: [phone] Office: [place] 05/26/2019, 9:32 AM

## 2019-05-26 NOTE — Plan of Care (Signed)
  Problem: Cardiac: Goal: Ability to maintain an adequate cardiac output will improve Outcome: Progressing   Problem: Fluid Volume: Goal: Ability to achieve a balanced intake and output will improve Outcome: Progressing   Problem: Metabolic: Goal: Ability to maintain appropriate glucose levels will improve Outcome: Progressing   Problem: Clinical Measurements: Goal: Ability to maintain clinical measurements within normal limits will improve Outcome: Progressing Goal: Will remain free from infection Outcome: Progressing Goal: Diagnostic test results will improve Outcome: Progressing Goal: Respiratory complications will improve Outcome: Progressing Goal: Cardiovascular complication will be avoided Outcome: Progressing

## 2019-05-26 NOTE — Progress Notes (Signed)
Inpatient Rehabilitation Admissions Coordinator  I continue to follow pt's progress at a distance.  Ottie Glazier, RN, MSN Rehab Admissions Coordinator [phone] 05/26/2019 10:23 AM

## 2019-05-26 NOTE — Progress Notes (Signed)
PROGRESS NOTE  Caleb Chambers XTG:[id number] DOB: [birth year] DOA: 05/12/2019 PCP: Patient, No Pcp Per   LOS: 14 days   Brief narrative: Patient is a 54 year old African-American male, morbidly obese, with past medical history significant for hypertension, DM, gout; and recent admission for pancreatitis and DKA.  Patient presented to the hospital with complaints of minimal oral intake since his discharge.  Patient reported shortness of breath and decreased ability to eat and drink.  He has not been fully compliant with insulin regimen as well.  Patient was then admitted to the hospital again for DKA and acute kidney injury, prerenal.  DKA and acute kidney injury have resolved.  Patient is currently being managed for complications of severe pancreatitis.  Assessment/Plan:  Principal Problem:   DKA (diabetic ketoacidosis) (HCC) Active Problems:   Acute pancreatitis   Acute kidney injury (HCC)   Morbid obesity (HCC)   Debility   Protein-calorie malnutrition, severe  Severe pancreatitis with peripancreatic fluid collection With concern for necrotizing pancreatitis and questionable superimposed infection Repeat CT abdomen pelvis 05/25/2019 showed persistent pancreatitis with new scattered peripancreatic gas,extensive ill-defined peripancreatic fluid extending throughout transverse colon into the root of mesentery. S/p cholecystectomy -Currently on clear liquid diet and tube feeds -Increase MS Contin dose to 30 mg twice daily for better pain control, continue as needed IV hydromorphone -GI on board.  Appreciate recommendations.  Started on Cipro/Flagyl on 05/25/2019. Tentative plan for endoscopic drainage on Thursday per GI -New onset diarrhea, stool C. difficile negative.  Likely related to tube feeds or secondary to worsening pancreatitis.  Albumin low ?  Negative acute phase reactant.  Recurrent DKA -Resolved.   -Blood sugars reasonably controlled.   -Hemoglobin A1c was 13.2 on 04/22/2019    -Patient is currently on tube feeding due to poor p.o. intake, as well as, clear liquid diet.   -Currently on basal bolus and subcu insulin.  - blood sugar 90 at 4 AM this morning for which is scheduled short acting insulin was held; also had an episode of hypoglycemia with blood glucose 47 yesterday though this was thought to be in setting of to closely administer doses, decrease insulin doses:- Lantus to 38 units in a.m. from 45 units, 14 units in p.m. from 20 units, NovoLog every 6 hours to 6 units from 10 units to avoid hypoglycemia.  Continue sliding scale insulin.  We will follow up on blood sugars and increase doses of insulin if needed.  Sinus tachycardia: Likely secondary to systemic inflammatory response syndrome and pain - pain control -Management of pancreatitis as above  History of hypertension: -Controlled.   -Continue to monitor closely.   Hypokalemia Replenished.  Hypomagnesemia: -Continue to monitor closely.     Hypophosphatemia.  Replenished.    Acute kidney injury on presentation: -Resolved.     Morbid obesity -BMI 43.4 -Further management on outpatient visit.  Debility and deconditioning. PT recommend CIR  Severe protein calorie malnutrition: -Caloric count  -Dietary team input is appreciated.     VTE Prophylaxis: Lovenox subq  Code Status:  Full code  Family Communication:   Disposition Plan:  Patient is from home. Patient has been seen by physical therapy who recommended CIR.  Likely disposition to CIR when medically stable. Barriers to discharge include improvement of severe pancreatitis, still on cortrak tube for feeding, needing IV Dilaudid for abdominal pain. He is at high risk of decompensation and overall prognosis is guarded.  Consultants:  GI  Interventional radiology  Procedures:  Cortrak feeding tube placement 05/14/2019  Antibiotics:   Anti-infectives (From admission, onward)   Start     Dose/Rate Route Frequency Ordered  Stop   05/25/19 1500  ciprofloxacin (CIPRO) IVPB 400 mg     400 mg 200 mL/hr over 60 Minutes Intravenous Every 12 hours 05/25/19 1408     05/25/19 1500  metroNIDAZOLE (FLAGYL) IVPB 500 mg     500 mg 100 mL/hr over 60 Minutes Intravenous Every 8 hours 05/25/19 1408       Subjective: Reports abdominal pain not improved with MS Contin or as needed hydromorphone.  He is agreeable to dose increase for better pain control.  Walked with physical therapy today but complained of nausea and fatigue thereafter.  Objective: Vitals:   05/26/19 0338 05/26/19 0738  BP: 122/72 129/74  Pulse: (!) 115 (!) 123  Resp: 14   Temp: 99 F (37.2 C) 99 F (37.2 C)  SpO2: 99% 94%    Intake/Output Summary (Last 24 hours) at 05/26/2019 0751 Last data filed at 05/25/2019 1900 Gross per 24 hour  Intake 1220 ml  Output 250 ml  Net 970 ml   Filed Weights   05/24/19 0200 05/25/19 0354 05/26/19 0338  Weight: (!) 137.1 kg 135.6 kg (!) 136.9 kg   Body mass index is 40.93 kg/m.   Physical Exam:  General: Morbidly obese.  Awake and alert.  In mild distress from nausea and pain.  Sitting on the chair. HEENT: Pallor.  No jaundice.  Dry buccal mucosa. Chest:  Clear to auscultation.    CVS: S1 &S2  Abdomen: Morbidly obese, soft and tender.  Bowel sounds present CNS: Patient is awake and alert.  Patient moves all extremities.  Extremities: Fullness of the ankle without any overt edema.    Data Review: I have personally reviewed the following laboratory data and studies,  CBC: Recent Labs  Lab 05/20/19 0747 05/21/19 0223 05/22/19 0156 05/24/19 0248 05/26/19 0509  WBC 8.2 8.1 4.8 7.4 6.2  NEUTROABS 6.1 6.2 3.8 5.1 4.0  HGB 9.7* 9.3* 8.9* 8.4* 8.4*  HCT 30.4* 29.5* 28.3* 26.5* 26.6*  MCV 76.2* 76.6* 76.1* 75.7* 75.4*  PLT 186 210 245 282 601   Basic Metabolic Panel: Recent Labs  Lab 05/20/19 0747 05/21/19 0223 05/22/19 0156 05/24/19 0248 05/26/19 0509  NA 144 138 141 137 134*  K 4.1 4.3 4.3  4.3 4.0  CL 101 102 103 102 100  CO2 27 25 26 24 24   GLUCOSE 114* 127* 226* 207* 125*  BUN 15 19 17 15 11   CREATININE 0.71 0.83 0.89 0.87 0.90  CALCIUM 9.1 8.3* 8.4* 8.2* 7.8*  MG 1.5* 1.8 1.7 1.7  --   PHOS  --  3.0  --  3.4  --    Liver Function Tests: Recent Labs  Lab 05/20/19 0747 05/21/19 0223 05/22/19 0156 05/24/19 0248 05/26/19 0509  AST 29 38 30 24 18   ALT 23 23 30 29 22   ALKPHOS 94 113 121 123 92  BILITOT 0.6 1.1 0.5 0.3 0.3  PROT 6.9 6.7 7.3 6.7 7.0  ALBUMIN 1.5* 1.4* 1.4* 1.2* 1.1*   No results for input(s): LIPASE, AMYLASE in the last 168 hours. No results for input(s): AMMONIA in the last 168 hours. Cardiac Enzymes: No results for input(s): CKTOTAL, CKMB, CKMBINDEX, TROPONINI in the last 168 hours. BNP (last 3 results) Recent Labs    04/23/19 0450  BNP 99.2    ProBNP (last 3 results) No results for input(s): PROBNP in the last 8760 hours.  CBG: Recent  Labs  Lab 05/25/19 1300 05/25/19 1418 05/25/19 1654 05/25/19 2122 05/26/19 0336  GLUCAP 68* 88 110* 170* 90   Recent Results (from the past 240 hour(s))  C difficile quick scan w PCR reflex     Status: None   Collection Time: 05/25/19  8:15 AM   Specimen: STOOL  Result Value Ref Range Status   C Diff antigen NEGATIVE NEGATIVE Final   C Diff toxin NEGATIVE NEGATIVE Final   C Diff interpretation No C. difficile detected.  Final    Comment: Performed at [facility], [address] [place], [place] [address]     Studies: CT ABDOMEN PELVIS W CONTRAST  Result Date: 05/25/2019 CLINICAL DATA:  Inpatient. Follow-up peripancreatic fluid collection in the setting of acute pancreatitis. EXAM: CT ABDOMEN AND PELVIS WITH CONTRAST TECHNIQUE: Multidetector CT imaging of the abdomen and pelvis was performed using the standard protocol following bolus administration of intravenous contrast. CONTRAST:  OMNIPAQUE IOHEXOL 300 MG/ML  SOLN COMPARISON:  05/13/2019 CT abdomen/pelvis. FINDINGS: Lower chest:  Mild bibasilar atelectasis. Hepatobiliary: Normal liver size. No liver mass. Cholecystectomy. No biliary ductal dilatation. CBD diameter 4 mm. Pancreas: Persistent diffuse extensive peripancreatic fat stranding. There is a lesser sac 14.6 x 8.3 cm fluid collection with new air-fluid level, previously 16.5 x 8.9 cm, mildly decreased in size. There is new scattered peripancreatic gas throughout the widespread ill-defined peripancreatic fluid extending throughout the transverse mesocolon and into the root of the mesentery, appearing similar in extent. No discrete pancreatic mass or duct dilation. Spleen: Normal size. No mass. Adrenals/Urinary Tract: Normal adrenals. No hydronephrosis. No renal masses. Normal bladder. Stomach/Bowel: Enteric tube terminates in the distal duodenum near the duodenal jejunal junction. Stomach is collapsed with stable extrinsic mass effect on the stomach from the lesser sac fluid collection. Normal caliber small bowel with no small bowel wall thickening. Normal appendix. Mild wall thickening in the distal transverse colon, splenic flexure of the colon and proximal descending colon, probably reactive. No large bowel dilatation or diverticulosis. Vascular/Lymphatic: Normal caliber abdominal aorta. Patent portal, splenic, hepatic and renal veins. No pathologically enlarged lymph nodes in the abdomen or pelvis. Reproductive: Top-normal size prostate. Trace pelvic and paracolic gutter ascites. Other: No pneumoperitoneum, ascites or focal fluid collection. Musculoskeletal: No aggressive appearing focal osseous lesions. Mild thoracolumbar spondylosis. IMPRESSION: 1. Large lesser sac fluid collection with new air-fluid level, mildly decreased in size since 05/13/2019 CT. 2. Persistent findings of pancreatitis with new scattered peripancreatic gas throughout the extensive ill-defined peripancreatic fluid extending throughout the transverse mesocolon and into the root of the mesentery, worrisome for  necrotizing pancreatitis. 3. Mild wall thickening in the distal transverse colon, splenic flexure of the colon and proximal descending colon, probably reactive. 4. Trace pelvic and paracolic gutter ascites. 5. Enteric tube terminates in the distal duodenum near the duodenal jejunal junction. Electronically Signed   By: Delbert Phenix M.D.   On: 05/25/2019 09:45    Scheduled Meds: . allopurinol  300 mg Oral Daily  . chlorhexidine  15 mL Mouth Rinse BID  . enoxaparin (LOVENOX) injection  40 mg Subcutaneous Q24H  . feeding supplement (PRO-STAT SUGAR FREE 64)  30 mL Per Tube TID  . insulin aspart  0-20 Units Subcutaneous Q6H  . insulin aspart  10 Units Subcutaneous Q6H  . insulin glargine  20 Units Subcutaneous QHS  . insulin glargine  45 Units Subcutaneous Daily  . mouth rinse  15 mL Mouth Rinse q12n4p  . morphine  15 mg Oral  Q12H  . pantoprazole (PROTONIX) IV  40 mg Intravenous Q12H    Continuous Infusions: . ciprofloxacin 400 mg (05/26/19 0349)  . feeding supplement (VITAL 1.5 CAL) 1,000 mL (05/25/19 0957)  . metronidazole 500 mg (05/26/19 0616)   Spent more than 30 minutes in coordinating care for this patient including bedside patient care.  Liborio Nixon, MD  [facility] 05/26/2019

## 2019-05-27 LAB — COMPREHENSIVE METABOLIC PANEL
ALT: 17 U/L (ref 0–44)
AST: 15 U/L (ref 15–41)
Albumin: 1 g/dL — ABNORMAL LOW (ref 3.5–5.0)
Alkaline Phosphatase: 86 U/L (ref 38–126)
Anion gap: 9 (ref 5–15)
BUN: 10 mg/dL (ref 6–20)
CO2: 25 mmol/L (ref 22–32)
Calcium: 7.9 mg/dL — ABNORMAL LOW (ref 8.9–10.3)
Chloride: 101 mmol/L (ref 98–111)
Creatinine, Ser: 0.84 mg/dL (ref 0.61–1.24)
GFR calc Af Amer: >60 mL/min (ref >60)
GFR calc non Af Amer: >60 mL/min (ref >60)
Glucose, Bld: 219 mg/dL — ABNORMAL HIGH (ref 70–99)
Potassium: 3.8 mmol/L (ref 3.5–5.1)
Sodium: 135 mmol/L (ref 135–145)
Total Bilirubin: 0.6 mg/dL (ref 0.3–1.2)
Total Protein: 6.9 g/dL (ref 6.5–8.1)

## 2019-05-27 LAB — CBC WITH DIFFERENTIAL/PLATELET
Abs Immature Granulocytes: 0.1 10*3/uL — ABNORMAL HIGH (ref 0.00–0.07)
Basophils Absolute: 0.1 10*3/uL (ref 0.0–0.1)
Basophils Relative: 1 %
Eosinophils Absolute: 0 10*3/uL (ref 0.0–0.5)
Eosinophils Relative: 1 %
HCT: 24.9 % — ABNORMAL LOW (ref 39.0–52.0)
Hemoglobin: 7.6 g/dL — ABNORMAL LOW (ref 13.0–17.0)
Immature Granulocytes: 1 %
Lymphocytes Relative: 27 %
Lymphs Abs: 2 10*3/uL (ref 0.7–4.0)
MCH: 23.7 pg — ABNORMAL LOW (ref 26.0–34.0)
MCHC: 30.5 g/dL (ref 30.0–36.0)
MCV: 77.6 fL — ABNORMAL LOW (ref 80.0–100.0)
Monocytes Absolute: 0.6 10*3/uL (ref 0.1–1.0)
Monocytes Relative: 8 %
Neutro Abs: 4.7 10*3/uL (ref 1.7–7.7)
Neutrophils Relative %: 62 %
Platelets: 329 10*3/uL (ref 150–400)
RBC: 3.21 MIL/uL — ABNORMAL LOW (ref 4.22–5.81)
RDW: 15.7 % — ABNORMAL HIGH (ref 11.5–15.5)
WBC: 7.4 10*3/uL (ref 4.0–10.5)
nRBC: 0.7 % — ABNORMAL HIGH (ref 0.0–0.2)

## 2019-05-27 LAB — GLUCOSE, CAPILLARY
Glucose-Capillary: 222 mg/dL — ABNORMAL HIGH (ref 70–99)
Glucose-Capillary: 166 mg/dL — ABNORMAL HIGH (ref 70–99)
Glucose-Capillary: 187 mg/dL — ABNORMAL HIGH (ref 70–99)

## 2019-05-27 LAB — MAGNESIUM: Magnesium: 1.6 mg/dL — ABNORMAL LOW (ref 1.7–2.4)

## 2019-05-27 MED ORDER — INSULIN GLARGINE 100 UNIT/ML ~~LOC~~ SOLN
42.0000 [IU] | Freq: Every day | SUBCUTANEOUS | Status: DC
  Administered 2019-05-27 – 2019-05-28 (×2): 42 [IU] via SUBCUTANEOUS
  Filled 2019-05-27 (×2): qty 0.42

## 2019-05-27 MED ORDER — INSULIN GLARGINE 100 UNIT/ML ~~LOC~~ SOLN
18.0000 [IU] | Freq: Every day | SUBCUTANEOUS | Status: DC
  Administered 2019-05-27: 18 [IU] via SUBCUTANEOUS
  Filled 2019-05-27 (×2): qty 0.18

## 2019-05-27 MED ORDER — MUSCLE RUB 10-15 % EX CREA
TOPICAL_CREAM | CUTANEOUS | Status: DC | PRN
  Filled 2019-05-27: qty 85

## 2019-05-27 MED ORDER — SODIUM CHLORIDE 0.9 % IV SOLN: INTRAVENOUS | Status: DC

## 2019-05-27 MED ORDER — MAGNESIUM SULFATE 2 GM/50ML IV SOLN
2.0000 g | Freq: Once | INTRAVENOUS | Status: AC
  Administered 2019-05-27: 11:00:00 2 g via INTRAVENOUS
  Filled 2019-05-27: qty 50

## 2019-05-27 MED ORDER — ENOXAPARIN SODIUM 40 MG/0.4ML ~~LOC~~ SOLN: 40.0000 mg | SUBCUTANEOUS | Status: DC

## 2019-05-27 MED ORDER — INSULIN ASPART 100 UNIT/ML ~~LOC~~ SOLN: 10.0000 [IU] | Freq: Four times a day (QID) | SUBCUTANEOUS | Status: DC

## 2019-05-27 NOTE — Progress Notes (Signed)
PROGRESS NOTE  Caleb Chambers:[id number] DOB: 07-30-1965 DOA: 05/12/2019 PCP: Patient, No Pcp Per   LOS: 15 days   Brief narrative: Patient is a 54 year old African-American male, morbidly obese, with past medical history significant for hypertension, DM, gout; and recent admission for pancreatitis and DKA.  Patient presented to the hospital with complaints of minimal oral intake since his discharge.  Patient reported shortness of breath and decreased ability to eat and drink.  He has not been fully compliant with insulin regimen as well.  Patient was then admitted to the hospital again for DKA and acute kidney injury, prerenal.  DKA and acute kidney injury have resolved.  Patient is currently being managed for complications of severe pancreatitis.  Assessment/Plan:  Principal Problem:   DKA (diabetic ketoacidosis) ([place]) Active Problems:   Acute pancreatitis   Acute kidney injury ([place])   Morbid obesity ([place])   Debility   Protein-calorie malnutrition, severe  Severe pancreatitis with peripancreatic fluid collection With concern for necrotizing pancreatitis and questionable superimposed infection Repeat CT abdomen pelvis 05/25/2019 showed persistent pancreatitis with new scattered peripancreatic gas,extensive ill-defined peripancreatic fluid extending throughout transverse colon into the root of mesentery. S/p cholecystectomy -Currently on clear liquid diet and tube feeds -Continue MS Contin dose to 30 mg twice daily for pain control, continue as needed IV hydromorphone -GI on board.  Appreciate recommendations.  Started on Cipro/Flagyl on 05/25/2019. Tentative plan for endoscopic drainage on Thursday per GI -New onset diarrhea, stool C. difficile negative.  Likely related to tube feeds or secondary to worsening pancreatitis.  Albumin low ?  Negative acute phase reactant.  Recurrent DKA -Resolved.   -Blood sugars reasonably controlled.   -Hemoglobin A1c was 13.2 on 04/22/2019  -Patient  is currently on tube feeding due to poor p.o. intake, as well as, clear liquid diet.   -Currently on basal bolus and subcu insulin.  - Adjust insulin dose as needed   Sinus tachycardia: Likely secondary to systemic inflammatory response syndrome and pain - pain control -Management of pancreatitis as above  History of hypertension: -Controlled.   -Continue to monitor closely.   Hypokalemia Replenished.  Hypomagnesemia: -Continue to monitor closely.     Hypophosphatemia.  Replenished.    Acute kidney injury on presentation: -Resolved.     Morbid obesity -BMI 43.4 -Further management on outpatient visit.  Debility and deconditioning. PT recommend CIR  Severe protein calorie malnutrition: -Caloric count  -Dietary team input is appreciated.     VTE Prophylaxis: Lovenox subq held by GI for procedure tomorrow   Code Status:  Full code  Family Communication:   Disposition Plan:  Patient is from home. Patient has been seen by physical therapy who recommended CIR.  Likely disposition to CIR when medically stable. Barriers to discharge include improvement of severe pancreatitis, still on cortrak tube for feeding, needing IV Dilaudid for abdominal pain. He is at high risk of decompensation and overall prognosis is guarded.  Consultants:  GI  Interventional radiology  Procedures:  Cortrak feeding tube placement 05/14/2019  Antibiotics:   Anti-infectives (From admission, onward)   Start     Dose/Rate Route Frequency Ordered Stop   05/25/19 1500  ciprofloxacin (CIPRO) IVPB 400 mg     400 mg 200 mL/hr over 60 Minutes Intravenous Every 12 hours 05/25/19 1408     05/25/19 1500  metroNIDAZOLE (FLAGYL) IVPB 500 mg     500 mg 100 mL/hr over 60 Minutes Intravenous Every 8 hours 05/25/19 1408  Subjective: Abdominal pain better. Complains of knee pain. Topical rub ordered.   Objective: Vitals:   05/27/19 0755 05/27/19 1136  BP: (!) 112/48 116/63  Pulse: (!) 119  (!) 122  Resp: 20 (!) 24  Temp: 98.6 F (37 C) 99.6 F (37.6 C)  SpO2: 97% 98%    Intake/Output Summary (Last 24 hours) at 05/27/2019 1450 Last data filed at 05/27/2019 0600 Gross per 24 hour  Intake 1320.39 ml  Output --  Net 1320.39 ml   Filed Weights   05/25/19 0354 05/26/19 0338 05/27/19 0450  Weight: 135.6 kg (!) 136.9 kg (!) 138 kg   Body mass index is 41.26 kg/m.   Physical Exam:  General: Morbidly obese.  Awake and alert. Not in distress  Lying down HEENT: Pallor.  No jaundice.  Dry buccal mucosa. Chest:  Clear to auscultation.    CVS: S1 &S2  Abdomen: Morbidly obese, soft and tender.  Bowel sounds present CNS: Patient is awake and alert.  Patient moves all extremities.  Extremities: Fullness of the ankle without any overt edema.    Data Review: I have personally reviewed the following laboratory data and studies,  CBC: Recent Labs  Lab 05/21/19 0223 05/22/19 0156 05/24/19 0248 05/26/19 0509 05/27/19 0224  WBC 8.1 4.8 7.4 6.2 7.4  NEUTROABS 6.2 3.8 5.1 4.0 4.7  HGB 9.3* 8.9* 8.4* 8.4* 7.6*  HCT 29.5* 28.3* 26.5* 26.6* 24.9*  MCV 76.6* 76.1* 75.7* 75.4* 77.6*  PLT 210 245 282 278 329   Basic Metabolic Panel: Recent Labs  Lab 05/21/19 0223 05/22/19 0156 05/24/19 0248 05/26/19 0509 05/27/19 0224  NA 138 141 137 134* 135  K 4.3 4.3 4.3 4.0 3.8  CL 102 103 102 100 101  CO2 25 26 24 24 25   GLUCOSE 127* 226* 207* 125* 219*  BUN 19 17 15 11 10   CREATININE 0.83 0.89 0.87 0.90 0.84  CALCIUM 8.3* 8.4* 8.2* 7.8* 7.9*  MG 1.8 1.7 1.7  --  1.6*  PHOS 3.0  --  3.4  --   --    Liver Function Tests: Recent Labs  Lab 05/21/19 0223 05/22/19 0156 05/24/19 0248 05/26/19 0509 05/27/19 0224  AST 38 30 24 18 15   ALT 23 30 29 22 17   ALKPHOS 113 121 123 92 86  BILITOT 1.1 0.5 0.3 0.3 0.6  PROT 6.7 7.3 6.7 7.0 6.9  ALBUMIN 1.4* 1.4* 1.2* 1.1* 1.0*   No results for input(s): LIPASE, AMYLASE in the last 168 hours. No results for input(s): AMMONIA in the  last 168 hours. Cardiac Enzymes: No results for input(s): CKTOTAL, CKMB, CKMBINDEX, TROPONINI in the last 168 hours. BNP (last 3 results) Recent Labs    04/23/19 0450  BNP 99.2    ProBNP (last 3 results) No results for input(s): PROBNP in the last 8760 hours.  CBG: Recent Labs  Lab 05/26/19 1633 05/26/19 2151 05/27/19 0345 05/27/19 0915 05/27/19 1447  GLUCAP 229* 214* 222* 166* 187*   Recent Results (from the past 240 hour(s))  C difficile quick scan w PCR reflex     Status: None   Collection Time: 05/25/19  8:15 AM   Specimen: STOOL  Result Value Ref Range Status   C Diff antigen NEGATIVE NEGATIVE Final   C Diff toxin NEGATIVE NEGATIVE Final   C Diff interpretation No C. difficile detected.  Final    Comment: Performed at [facility], [address] [place], 07/23/19 [facility]     Studies: No results  found.  Scheduled Meds: . allopurinol  300 mg Oral Daily  . chlorhexidine  15 mL Mouth Rinse BID  . feeding supplement (PRO-STAT SUGAR FREE 64)  30 mL Per Tube TID  . insulin aspart  0-20 Units Subcutaneous Q6H  . insulin aspart  10 Units Subcutaneous Q6H  . insulin glargine  18 Units Subcutaneous QHS  . insulin glargine  42 Units Subcutaneous Daily  . mouth rinse  15 mL Mouth Rinse q12n4p  . morphine  30 mg Oral Q12H  . pantoprazole (PROTONIX) IV  40 mg Intravenous Q12H    Continuous Infusions: . sodium chloride    . ciprofloxacin Stopped (05/27/19 0420)  . feeding supplement (VITAL 1.5 CAL) 60 mL/hr at 05/26/19 1200  . metronidazole 500 mg (05/27/19 0646)   Spent more than 30 minutes in coordinating care for this patient including bedside patient care.  Liborio Nixon, MD  [facility] 05/27/2019

## 2019-05-27 NOTE — H&P (View-Only) (Signed)
Subjective: Some drainage from his scalp.  Objective: Vital signs in last 24 hours: Temp:  [97.5 F (36.4 C)-98.9 F (37.2 C)] 98.6 F (37 C) (02/10 0755) Pulse Rate:  [103-119] 119 (02/10 0755) Resp:  [14-29] 20 (02/10 0755) BP: (105-130)/(48-73) 112/48 (02/10 0755) SpO2:  [94 %-100 %] 97 % (02/10 0755) Weight:  [825 kg] 138 kg (02/10 0450) Last BM Date: 05/26/19  Intake/Output from previous day: 02/09 0701 - 02/10 0700 In: 2714.2 [P.O.:200; NG/GT:1193.8; IV Piggyback:1320.4] Out: -  Intake/Output this shift: No intake/output data recorded.  General appearance: weak GI: mild mid abdominal tenderness  Lab Results: Recent Labs    05/26/19 0509 05/27/19 0224  WBC 6.2 7.4  HGB 8.4* 7.6*  HCT 26.6* 24.9*  PLT 278 329   BMET Recent Labs    05/26/19 0509 05/27/19 0224  NA 134* 135  K 4.0 3.8  CL 100 101  CO2 24 25  GLUCOSE 125* 219*  BUN 11 10  CREATININE 0.90 0.84  CALCIUM 7.8* 7.9*   LFT Recent Labs    05/27/19 0224  PROT 6.9  ALBUMIN 1.0*  AST 15  ALT 17  ALKPHOS 86  BILITOT 0.6   PT/INR No results for input(s): LABPROT, INR in the last 72 hours. Hepatitis Panel No results for input(s): HEPBSAG, HCVAB, HEPAIGM, HEPBIGM in the last 72 hours. C-Diff Recent Labs    05/25/19 0815  CDIFFTOX NEGATIVE   Fecal Lactopherrin No results for input(s): FECLLACTOFRN in the last 72 hours.  Studies/Results: CT ABDOMEN PELVIS W CONTRAST  Result Date: 05/25/2019 CLINICAL DATA:  Inpatient. Follow-up peripancreatic fluid collection in the setting of acute pancreatitis. EXAM: CT ABDOMEN AND PELVIS WITH CONTRAST TECHNIQUE: Multidetector CT imaging of the abdomen and pelvis was performed using the standard protocol following bolus administration of intravenous contrast. CONTRAST:  OMNIPAQUE IOHEXOL 300 MG/ML  SOLN COMPARISON:  05/13/2019 CT abdomen/pelvis. FINDINGS: Lower chest: Mild bibasilar atelectasis. Hepatobiliary: Normal liver size. No liver mass.  Cholecystectomy. No biliary ductal dilatation. CBD diameter 4 mm. Pancreas: Persistent diffuse extensive peripancreatic fat stranding. There is a lesser sac 14.6 x 8.3 cm fluid collection with new air-fluid level, previously 16.5 x 8.9 cm, mildly decreased in size. There is new scattered peripancreatic gas throughout the widespread ill-defined peripancreatic fluid extending throughout the transverse mesocolon and into the root of the mesentery, appearing similar in extent. No discrete pancreatic mass or duct dilation. Spleen: Normal size. No mass. Adrenals/Urinary Tract: Normal adrenals. No hydronephrosis. No renal masses. Normal bladder. Stomach/Bowel: Enteric tube terminates in the distal duodenum near the duodenal jejunal junction. Stomach is collapsed with stable extrinsic mass effect on the stomach from the lesser sac fluid collection. Normal caliber small bowel with no small bowel wall thickening. Normal appendix. Mild wall thickening in the distal transverse colon, splenic flexure of the colon and proximal descending colon, probably reactive. No large bowel dilatation or diverticulosis. Vascular/Lymphatic: Normal caliber abdominal aorta. Patent portal, splenic, hepatic and renal veins. No pathologically enlarged lymph nodes in the abdomen or pelvis. Reproductive: Top-normal size prostate. Trace pelvic and paracolic gutter ascites. Other: No pneumoperitoneum, ascites or focal fluid collection. Musculoskeletal: No aggressive appearing focal osseous lesions. Mild thoracolumbar spondylosis. IMPRESSION: 1. Large lesser sac fluid collection with new air-fluid level, mildly decreased in size since 05/13/2019 CT. 2. Persistent findings of pancreatitis with new scattered peripancreatic gas throughout the extensive ill-defined peripancreatic fluid extending throughout the transverse mesocolon and into the root of the mesentery, worrisome for necrotizing pancreatitis. 3. Mild wall thickening  in the distal transverse  colon, splenic flexure of the colon and proximal descending colon, probably reactive. 4. Trace pelvic and paracolic gutter ascites. 5. Enteric tube terminates in the distal duodenum near the duodenal jejunal junction. Electronically Signed   By: Jason A Poff M.D.   On: 05/25/2019 09:45    Medications:  Scheduled: . allopurinol  300 mg Oral Daily  . chlorhexidine  15 mL Mouth Rinse BID  . enoxaparin (LOVENOX) injection  40 mg Subcutaneous Q24H  . feeding supplement (PRO-STAT SUGAR FREE 64)  30 mL Per Tube TID  . insulin aspart  0-20 Units Subcutaneous Q6H  . insulin aspart  6 Units Subcutaneous Q6H  . insulin glargine  14 Units Subcutaneous QHS  . insulin glargine  38 Units Subcutaneous Daily  . mouth rinse  15 mL Mouth Rinse q12n4p  . morphine  30 mg Oral Q12H  . pantoprazole (PROTONIX) IV  40 mg Intravenous Q12H   Continuous: . ciprofloxacin Stopped (05/27/19 0420)  . feeding supplement (VITAL 1.5 CAL) 60 mL/hr at 05/26/19 1200  . metronidazole 500 mg (05/27/19 0646)    Assessment/Plan: 1) Severe acute pancreatitis. 2) Severe malnutrition. 3) ? Scalp cyst. 4) Diarrhea.   The patient's clinical progress has plateaued.  This is felt to be associated with his fluid collection and the possibility of infection.  His tube feeding cannot be decreased as his his on the minimal amount for his severe malnutrition.  There is also a cyst or some benign skin lesion on his scalp.  He reports that it caused some drainage.  The patient also reports left knee pain and PT was of no benefit with helping that issue yesterday, although it is not bothering currently.  Plan: 1) Endoscopic drainage of the cyst tomorrow. 2) Continue with tube feeding. 3) Defer left knee pain and scalp complaints to hospitalist for further work up. 4) Continue with antibiotics.  LOS: 15 days   Doyne Micke D 05/27/2019, 7:59 AM 

## 2019-05-27 NOTE — Progress Notes (Signed)
Subjective: Some drainage from his scalp.  Objective: Vital signs in last 24 hours: Temp:  [97.5 F (36.4 C)-98.9 F (37.2 C)] 98.6 F (37 C) (02/10 0755) Pulse Rate:  [103-119] 119 (02/10 0755) Resp:  [14-29] 20 (02/10 0755) BP: (105-130)/(48-73) 112/48 (02/10 0755) SpO2:  [94 %-100 %] 97 % (02/10 0755) Weight:  [825 kg] 138 kg (02/10 0450) Last BM Date: 05/26/19  Intake/Output from previous day: 02/09 0701 - 02/10 0700 In: 2714.2 [P.O.:200; NG/GT:1193.8; IV Piggyback:1320.4] Out: -  Intake/Output this shift: No intake/output data recorded.  General appearance: weak GI: mild mid abdominal tenderness  Lab Results: Recent Labs    05/26/19 0509 05/27/19 0224  WBC 6.2 7.4  HGB 8.4* 7.6*  HCT 26.6* 24.9*  PLT 278 329   BMET Recent Labs    05/26/19 0509 05/27/19 0224  NA 134* 135  K 4.0 3.8  CL 100 101  CO2 24 25  GLUCOSE 125* 219*  BUN 11 10  CREATININE 0.90 0.84  CALCIUM 7.8* 7.9*   LFT Recent Labs    05/27/19 0224  PROT 6.9  ALBUMIN 1.0*  AST 15  ALT 17  ALKPHOS 86  BILITOT 0.6   PT/INR No results for input(s): LABPROT, INR in the last 72 hours. Hepatitis Panel No results for input(s): HEPBSAG, HCVAB, HEPAIGM, HEPBIGM in the last 72 hours. C-Diff Recent Labs    05/25/19 0815  CDIFFTOX NEGATIVE   Fecal Lactopherrin No results for input(s): FECLLACTOFRN in the last 72 hours.  Studies/Results: CT ABDOMEN PELVIS W CONTRAST  Result Date: 05/25/2019 CLINICAL DATA:  Inpatient. Follow-up peripancreatic fluid collection in the setting of acute pancreatitis. EXAM: CT ABDOMEN AND PELVIS WITH CONTRAST TECHNIQUE: Multidetector CT imaging of the abdomen and pelvis was performed using the standard protocol following bolus administration of intravenous contrast. CONTRAST:  OMNIPAQUE IOHEXOL 300 MG/ML  SOLN COMPARISON:  05/13/2019 CT abdomen/pelvis. FINDINGS: Lower chest: Mild bibasilar atelectasis. Hepatobiliary: Normal liver size. No liver mass.  Cholecystectomy. No biliary ductal dilatation. CBD diameter 4 mm. Pancreas: Persistent diffuse extensive peripancreatic fat stranding. There is a lesser sac 14.6 x 8.3 cm fluid collection with new air-fluid level, previously 16.5 x 8.9 cm, mildly decreased in size. There is new scattered peripancreatic gas throughout the widespread ill-defined peripancreatic fluid extending throughout the transverse mesocolon and into the root of the mesentery, appearing similar in extent. No discrete pancreatic mass or duct dilation. Spleen: Normal size. No mass. Adrenals/Urinary Tract: Normal adrenals. No hydronephrosis. No renal masses. Normal bladder. Stomach/Bowel: Enteric tube terminates in the distal duodenum near the duodenal jejunal junction. Stomach is collapsed with stable extrinsic mass effect on the stomach from the lesser sac fluid collection. Normal caliber small bowel with no small bowel wall thickening. Normal appendix. Mild wall thickening in the distal transverse colon, splenic flexure of the colon and proximal descending colon, probably reactive. No large bowel dilatation or diverticulosis. Vascular/Lymphatic: Normal caliber abdominal aorta. Patent portal, splenic, hepatic and renal veins. No pathologically enlarged lymph nodes in the abdomen or pelvis. Reproductive: Top-normal size prostate. Trace pelvic and paracolic gutter ascites. Other: No pneumoperitoneum, ascites or focal fluid collection. Musculoskeletal: No aggressive appearing focal osseous lesions. Mild thoracolumbar spondylosis. IMPRESSION: 1. Large lesser sac fluid collection with new air-fluid level, mildly decreased in size since 05/13/2019 CT. 2. Persistent findings of pancreatitis with new scattered peripancreatic gas throughout the extensive ill-defined peripancreatic fluid extending throughout the transverse mesocolon and into the root of the mesentery, worrisome for necrotizing pancreatitis. 3. Mild wall thickening  in the distal transverse  colon, splenic flexure of the colon and proximal descending colon, probably reactive. 4. Trace pelvic and paracolic gutter ascites. 5. Enteric tube terminates in the distal duodenum near the duodenal jejunal junction. Electronically Signed   By: Ilona Sorrel M.D.   On: 05/25/2019 09:45    Medications:  Scheduled: . allopurinol  300 mg Oral Daily  . chlorhexidine  15 mL Mouth Rinse BID  . enoxaparin (LOVENOX) injection  40 mg Subcutaneous Q24H  . feeding supplement (PRO-STAT SUGAR FREE 64)  30 mL Per Tube TID  . insulin aspart  0-20 Units Subcutaneous Q6H  . insulin aspart  6 Units Subcutaneous Q6H  . insulin glargine  14 Units Subcutaneous QHS  . insulin glargine  38 Units Subcutaneous Daily  . mouth rinse  15 mL Mouth Rinse q12n4p  . morphine  30 mg Oral Q12H  . pantoprazole (PROTONIX) IV  40 mg Intravenous Q12H   Continuous: . ciprofloxacin Stopped (05/27/19 0420)  . feeding supplement (VITAL 1.5 CAL) 60 mL/hr at 05/26/19 1200  . metronidazole 500 mg (05/27/19 1191)    Assessment/Plan: 1) Severe acute pancreatitis. 2) Severe malnutrition. 3) ? Scalp cyst. 4) Diarrhea.   The patient's clinical progress has plateaued.  This is felt to be associated with his fluid collection and the possibility of infection.  His tube feeding cannot be decreased as his his on the minimal amount for his severe malnutrition.  There is also a cyst or some benign skin lesion on his scalp.  He reports that it caused some drainage.  The patient also reports left knee pain and PT was of no benefit with helping that issue yesterday, although it is not bothering currently.  Plan: 1) Endoscopic drainage of the cyst tomorrow. 2) Continue with tube feeding. 3) Defer left knee pain and scalp complaints to hospitalist for further work up. 4) Continue with antibiotics.  LOS: 15 days   Yanely Mast D 05/27/2019, 7:59 AM

## 2019-05-27 NOTE — Plan of Care (Signed)
  Problem: Cardiac: Goal: Ability to maintain an adequate cardiac output will improve Outcome: Progressing   Problem: Health Behavior/Discharge Planning: Goal: Ability to identify and utilize available resources and services will improve Outcome: Progressing Goal: Ability to manage health-related needs will improve Outcome: Progressing   Problem: Metabolic: Goal: Ability to maintain appropriate glucose levels will improve Outcome: Progressing

## 2019-05-27 NOTE — Plan of Care (Signed)
  Problem: Education: Goal: Ability to describe self-care measures that may prevent or decrease complications (Diabetes Survival Skills Education) will improve Outcome: Progressing Goal: Individualized Educational Video(s) Outcome: Progressing   Problem: Cardiac: Goal: Ability to maintain an adequate cardiac output will improve Outcome: Progressing   Problem: Health Behavior/Discharge Planning: Goal: Ability to identify and utilize available resources and services will improve Outcome: Progressing   Problem: Health Behavior/Discharge Planning: Goal: Ability to manage health-related needs will improve Outcome: Progressing   Problem: Fluid Volume: Goal: Ability to achieve a balanced intake and output will improve Outcome: Progressing   Problem: Metabolic: Goal: Ability to maintain appropriate glucose levels will improve Outcome: Progressing   Problem: Nutritional: Goal: Maintenance of adequate nutrition will improve Outcome: Progressing Goal: Maintenance of adequate weight for body size and type will improve Outcome: Progressing   Problem: Respiratory: Goal: Will regain and/or maintain adequate ventilation Outcome: Progressing   Problem: Urinary Elimination: Goal: Ability to achieve and maintain adequate renal perfusion and functioning will improve Outcome: Progressing   Problem: Education: Goal: Knowledge of General Education information will improve Description: Including pain rating scale, medication(s)/side effects and non-pharmacologic comfort measures Outcome: Progressing   Problem: Health Behavior/Discharge Planning: Goal: Ability to manage health-related needs will improve Outcome: Progressing   Problem: Clinical Measurements: Goal: Ability to maintain clinical measurements within normal limits will improve Outcome: Progressing Goal: Will remain free from infection Outcome: Progressing Goal: Diagnostic test results will improve Outcome: Progressing Goal:  Respiratory complications will improve Outcome: Progressing Goal: Cardiovascular complication will be avoided Outcome: Progressing   Problem: Activity: Goal: Risk for activity intolerance will decrease Outcome: Progressing   Problem: Nutrition: Goal: Adequate nutrition will be maintained Outcome: Progressing   Problem: Coping: Goal: Level of anxiety will decrease Outcome: Progressing   Problem: Elimination: Goal: Will not experience complications related to bowel motility Outcome: Progressing Goal: Will not experience complications related to urinary retention Outcome: Progressing   Problem: Pain Managment: Goal: General experience of comfort will improve Outcome: Progressing   Problem: Safety: Goal: Ability to remain free from injury will improve Outcome: Progressing   Problem: Skin Integrity: Goal: Risk for impaired skin integrity will decrease Outcome: Progressing

## 2019-05-28 ENCOUNTER — Encounter (HOSPITAL_COMMUNITY)
Admission: EM | Disposition: A | Discharge status: Home Health | Payer: Self-pay | Source: Home / Self Care | Attending: Internal Medicine

## 2019-05-28 ENCOUNTER — Inpatient Hospital Stay (HOSPITAL_COMMUNITY): Payer: BC Managed Care – PPO

## 2019-05-28 ENCOUNTER — Encounter (HOSPITAL_COMMUNITY): Payer: Self-pay | Admitting: Internal Medicine

## 2019-05-28 DIAGNOSIS — K859 Acute pancreatitis without necrosis or infection, unspecified: Secondary | ICD-10-CM

## 2019-05-28 DIAGNOSIS — K209 Esophagitis, unspecified without bleeding: Secondary | ICD-10-CM

## 2019-05-28 DIAGNOSIS — K862 Cyst of pancreas: Secondary | ICD-10-CM

## 2019-05-28 HISTORY — PX: ESOPHAGOGASTRODUODENOSCOPY (EGD) WITH PROPOFOL: SHX5813

## 2019-05-28 HISTORY — PX: EUS: SHX5427

## 2019-05-28 HISTORY — PX: PANCREATIC STENT PLACEMENT: SHX5539

## 2019-05-28 HISTORY — PX: BILIARY STENT PLACEMENT: SHX5538

## 2019-05-28 LAB — CBC WITH DIFFERENTIAL/PLATELET
Abs Immature Granulocytes: 0.1 10*3/uL — ABNORMAL HIGH (ref 0.00–0.07)
Band Neutrophils: 4 %
Basophils Absolute: 0 10*3/uL (ref 0.0–0.1)
Basophils Relative: 0 %
Eosinophils Absolute: 0 10*3/uL (ref 0.0–0.5)
Eosinophils Relative: 0 %
HCT: 25.6 % — ABNORMAL LOW (ref 39.0–52.0)
Hemoglobin: 7.9 g/dL — ABNORMAL LOW (ref 13.0–17.0)
Lymphocytes Relative: 11 %
Lymphs Abs: 1 10*3/uL (ref 0.7–4.0)
MCH: 23.6 pg — ABNORMAL LOW (ref 26.0–34.0)
MCHC: 30.9 g/dL (ref 30.0–36.0)
MCV: 76.4 fL — ABNORMAL LOW (ref 80.0–100.0)
Metamyelocytes Relative: 1 %
Monocytes Absolute: 0.5 10*3/uL (ref 0.1–1.0)
Monocytes Relative: 5 %
Neutro Abs: 7.9 10*3/uL — ABNORMAL HIGH (ref 1.7–7.7)
Neutrophils Relative %: 79 %
Platelets: 341 10*3/uL (ref 150–400)
RBC: 3.35 MIL/uL — ABNORMAL LOW (ref 4.22–5.81)
RDW: 15.8 % — ABNORMAL HIGH (ref 11.5–15.5)
WBC: 9.5 10*3/uL (ref 4.0–10.5)
nRBC: 0 /100 WBC
nRBC: 0.4 % — ABNORMAL HIGH (ref 0.0–0.2)

## 2019-05-28 LAB — GLUCOSE, CAPILLARY
Glucose-Capillary: 217 mg/dL — ABNORMAL HIGH (ref 70–99)
Glucose-Capillary: 176 mg/dL — ABNORMAL HIGH (ref 70–99)
Glucose-Capillary: 166 mg/dL — ABNORMAL HIGH (ref 70–99)
Glucose-Capillary: 90 mg/dL (ref 70–99)
Glucose-Capillary: 83 mg/dL (ref 70–99)

## 2019-05-28 LAB — PROTIME-INR
INR: 1.4 — ABNORMAL HIGH (ref 0.8–1.2)
Prothrombin Time: 16.6 seconds — ABNORMAL HIGH (ref 11.4–15.2)

## 2019-05-28 LAB — BASIC METABOLIC PANEL
Anion gap: 8 (ref 5–15)
BUN: 7 mg/dL (ref 6–20)
CO2: 25 mmol/L (ref 22–32)
Calcium: 8 mg/dL — ABNORMAL LOW (ref 8.9–10.3)
Chloride: 101 mmol/L (ref 98–111)
Creatinine, Ser: 0.84 mg/dL (ref 0.61–1.24)
GFR calc Af Amer: >60 mL/min (ref >60)
GFR calc non Af Amer: >60 mL/min (ref >60)
Glucose, Bld: 220 mg/dL — ABNORMAL HIGH (ref 70–99)
Potassium: 4.2 mmol/L (ref 3.5–5.1)
Sodium: 134 mmol/L — ABNORMAL LOW (ref 135–145)

## 2019-05-28 SURGERY — ESOPHAGOGASTRODUODENOSCOPY (EGD) WITH PROPOFOL
Anesthesia: General

## 2019-05-28 MED ORDER — INSULIN ASPART 100 UNIT/ML ~~LOC~~ SOLN
0.0000 [IU] | Freq: Three times a day (TID) | SUBCUTANEOUS | Status: DC
  Administered 2019-05-29 – 2019-05-30 (×2): 3 [IU] via SUBCUTANEOUS
  Administered 2019-05-31: 4 [IU] via SUBCUTANEOUS
  Administered 2019-05-31 (×2): 3 [IU] via SUBCUTANEOUS
  Administered 2019-06-01 (×3): 7 [IU] via SUBCUTANEOUS
  Administered 2019-06-02: 11 [IU] via SUBCUTANEOUS
  Administered 2019-06-02 – 2019-06-03 (×3): 7 [IU] via SUBCUTANEOUS
  Administered 2019-06-03: 11 [IU] via SUBCUTANEOUS
  Administered 2019-06-04 (×2): 4 [IU] via SUBCUTANEOUS
  Administered 2019-06-04: 7 [IU] via SUBCUTANEOUS
  Administered 2019-06-05: 4 [IU] via SUBCUTANEOUS
  Administered 2019-06-05: 3 [IU] via SUBCUTANEOUS
  Administered 2019-06-05 – 2019-06-06 (×2): 4 [IU] via SUBCUTANEOUS
  Administered 2019-06-06: 12:00:00 7 [IU] via SUBCUTANEOUS
  Administered 2019-06-06: 4 [IU] via SUBCUTANEOUS
  Administered 2019-06-07: 07:00:00 11 [IU] via SUBCUTANEOUS

## 2019-05-28 MED ORDER — INSULIN GLARGINE 100 UNIT/ML ~~LOC~~ SOLN
10.0000 [IU] | Freq: Every day | SUBCUTANEOUS | Status: DC
  Filled 2019-05-28: qty 0.1

## 2019-05-28 MED ORDER — LACTATED RINGERS IV SOLN: INTRAVENOUS | Status: DC | PRN

## 2019-05-28 MED ORDER — INSULIN GLARGINE 100 UNIT/ML ~~LOC~~ SOLN
12.0000 [IU] | Freq: Every day | SUBCUTANEOUS | Status: DC
  Filled 2019-05-28: qty 0.12

## 2019-05-28 MED ORDER — SUGAMMADEX SODIUM 200 MG/2ML IV SOLN
INTRAVENOUS | Status: DC | PRN
  Administered 2019-05-28: 300 mg via INTRAVENOUS

## 2019-05-28 MED ORDER — INSULIN ASPART 100 UNIT/ML ~~LOC~~ SOLN
0.0000 [IU] | Freq: Every day | SUBCUTANEOUS | Status: DC
  Administered 2019-06-01 – 2019-06-06 (×3): 2 [IU] via SUBCUTANEOUS

## 2019-05-28 MED ORDER — INSULIN GLARGINE 100 UNIT/ML ~~LOC~~ SOLN: 33.0000 [IU] | Freq: Every day | SUBCUTANEOUS | Status: DC

## 2019-05-28 MED ORDER — LACTATED RINGERS IV SOLN
INTRAVENOUS | Status: AC | PRN
  Administered 2019-05-28: 1000 mL via INTRAVENOUS

## 2019-05-28 MED ORDER — INSULIN ASPART 100 UNIT/ML ~~LOC~~ SOLN: 4.0000 [IU] | Freq: Three times a day (TID) | SUBCUTANEOUS | Status: DC

## 2019-05-28 MED ORDER — ONDANSETRON HCL 4 MG/2ML IJ SOLN
INTRAMUSCULAR | Status: DC | PRN
  Administered 2019-05-28: 4 mg via INTRAVENOUS

## 2019-05-28 MED ORDER — ROCURONIUM BROMIDE 10 MG/ML (PF) SYRINGE
PREFILLED_SYRINGE | INTRAVENOUS | Status: DC | PRN
  Administered 2019-05-28: 30 mg via INTRAVENOUS
  Administered 2019-05-28: 40 mg via INTRAVENOUS

## 2019-05-28 MED ORDER — PROPOFOL 10 MG/ML IV BOLUS
INTRAVENOUS | Status: DC | PRN
  Administered 2019-05-28: 80 mg via INTRAVENOUS
  Administered 2019-05-28: 30 mg via INTRAVENOUS

## 2019-05-28 MED ORDER — SUCCINYLCHOLINE CHLORIDE 200 MG/10ML IV SOSY
PREFILLED_SYRINGE | INTRAVENOUS | Status: DC | PRN
  Administered 2019-05-28: 140 mg via INTRAVENOUS

## 2019-05-28 MED ORDER — LIDOCAINE 2% (20 MG/ML) 5 ML SYRINGE
INTRAMUSCULAR | Status: DC | PRN
  Administered 2019-05-28: 80 mg via INTRAVENOUS

## 2019-05-28 MED ORDER — PHENYLEPHRINE HCL-NACL 10-0.9 MG/250ML-% IV SOLN
INTRAVENOUS | Status: DC | PRN
  Administered 2019-05-28: 25 ug/min via INTRAVENOUS

## 2019-05-28 MED ORDER — INSULIN GLARGINE 100 UNIT/ML ~~LOC~~ SOLN
25.0000 [IU] | Freq: Every day | SUBCUTANEOUS | Status: DC
  Filled 2019-05-28: qty 0.25

## 2019-05-28 SURGICAL SUPPLY — 15 items

## 2019-05-28 NOTE — Op Note (Addendum)
[facility] Patient Name: Caleb Chambers Procedure Date : 05/28/2019 MRN: [id number] Attending MD: Justice Britain , MD Date of Birth: [birth year] CSN: [id number] Age: 54 Admit Type: Inpatient Procedure:                Upper EUS Indications:              Pancreatic cyst on CT scan, Acute pancreatitis,                            Pancreatic cyst, Pancreatic necrosis Providers:                Justice Britain, MD, Carlyn Reichert, RN, Lazaro Arms, Technician, Corie Chiquito, Technician, Lerry Paterson, CRNA Referring MD:             Carol Ada, MD, [facility] Medicines:                Monitored Anesthesia Care, General Anesthesia,                            Antibiotics were held as a result of recent                            Ciprofloxacin/Flagyl given earlier in day as                            already prescribed Complications:            No immediate complications. Estimated Blood Loss:     Estimated blood loss: none. Procedure:                Pre-Anesthesia Assessment:                           - Prior to the procedure, a History and Physical                            was performed, and patient medications and                            allergies were reviewed. The patient's tolerance of                            previous anesthesia was also reviewed. The risks                            and benefits of the procedure and the sedation                            options and risks were discussed with the patient.  All questions were answered, and informed consent                            was obtained. Prior Anticoagulants: The patient has                            taken Lovenox (enoxaparin), last dose was 2 days                            prior to procedure. ASA Grade Assessment: III - A                            patient with severe systemic disease. After    reviewing the risks and benefits, the patient was                            deemed in satisfactory condition to undergo the                            procedure.                           After obtaining informed consent, the endoscope was                            passed under direct vision. Throughout the                            procedure, the patient's blood pressure, pulse, and                            oxygen saturations were monitored continuously. The                            [id number]-[id number] ([id number]) Olympus therapeutic                            gastroscope was introduced through the mouth, and                            advanced to the second part of duodenum. The                            [id number] ([id number]) Olympus Linear EUS scope was                            introduced through the mouth, and advanced to the                            stomach for ultrasound examination. The [id number]-[id number]                            ([id number]) Olympus duodenoscope was introduced  through the mouth, and advanced to the second part                            of duodenum. The upper EUS was accomplished without                            difficulty. The patient tolerated the procedure. Scope In: Scope Out: Findings:      ENDOSCOPIC FINDING: :      No gross lesions were noted in the proximal esophagus.      LA Grade C (one or more mucosal breaks continuous between tops of 2 or       more mucosal folds, less than 75% circumference) esophagitis with no       bleeding was found 30 to 39 cm from the incisors.      LA Grade D (one or more mucosal breaks involving at least 75% of       esophageal circumference) esophagitis was found at the gastroesophageal       junction.      Patchy moderately erythematous mucosa without bleeding was found in the       entire examined stomach.      Extrinsic compression on the stomach was found in the proximal/middle       gastric body.       No gross lesions were noted in the duodenal bulb.      Diffuse severe inflammation characterized by congestion (edema),       erythema and granularity was found in the first portion of the duodenum,       in the second portion of the duodenum and at the region of the major       papilla (I could not visualize this completely).      ENDOSONOGRAPHIC FINDING: :      An anechoic lesion suggestive of a cyst was identified in the pancreatic       body. The lesion measured 140 mm by 72 mm in maximal cross-sectional       diameter. There was a single compartment without septae. The outer wall       of the lesion was thick. There was no associated mass. There was       internal debris within the fluid-filled cavity. The decision was made to       create a cystogastrostomy using the AXIOS stent system. Once an       appropriate position in the stomach was identified, the common wall       between the stomach and the cyst was interrogated utilizing color       Doppler imaging to identify interposed vessels. The stomach wall and the       cyst were punctured under endosonographic guidance using the AXIOS stent       and electrocautery device was introduced through the working channel and       advanced over the guidewire. Current was applied to the cautery tip and       then used to increase the diameter of the stoma. The AXIOS device was       advanced into the cyst, and a 15 x 10 mm AXIOS stent was placed with the       flanges in close approximation to the walls of the cyst and the stomach       through the cystogastrostomy. The stent  was successfully placed. A TTS       dilator was passed through the scope. Dilation with a 12-13.5-15 mm       balloon dilator was performed. Suction via Endoscope was performed and       revmoed 1.2 L of fluid. The cyst was completely filled with fluid and       necrotic tissue that was pasty and adherent to the cyst wall. At the       conclusion of the procedure, a  large amount of necrotic tissue and a       medium amount of pink, viable tissue was found within the pseudocyst on       direct vision. Two double pigtail stents were placed 10 French x 5 cm       and 7 French x 7 cm were inserted across the AXIOS cystgastrostomy tract       to decrease risk of necroma closing off AXIOS. Impression:               EGD Impression:                           - LA Grade C esophagitis with no bleeding distally                            and LA Grade D esophagitis at [employer].                           - Erythematous mucosa in the stomach.                           - Extrinsic compression in the gastric body.                           - No gross lesions in the duodenal bulb.                           - Duodenitis.                           EUS Impression:                           - A cystic lesion was seen in the pancreatic body.                            Tissue has not been obtained. However, the                            endosonographic appearance is consistent with a                            pancreatic pseudocyst with walled-off necrosis.                            AXIOS Cystogastrostomy was created. 1.2L of fluid  was removed. Pancreatic necrosis noted within the                            cyst cavity. Double pigtails placed through AXIOS                            to decrease Necroma closing off AXIOS. Recommendation:           - The patient will be observed post-procedure,                            until all discharge criteria are met.                           - Return patient to hospital ward for ongoing care.                           - Continue Cipro/Flagyl for now and await culture                            data to see if any other treatment/antimicrobials                            will be required.                           - Clear liquid diet today into tomorrow. May                            require  replacement of Cortrak but we will have to                            see.                           - EGD with necrosectomy planned tentatively for                            Monday 2/15.                           - Stop PPI to allow acid to work on the necrosis.                           - No Chemical VTE PPx for at least 48 hours to                            decrease risk of bleeding post-EUS but OK for                            SCDs/TEDs.                           - Monitor for signs/symptoms of  Pancreatitis/bleeding/perforation.                           - I will be away this weekend, but if concern for                            bleeding then proceed with EGD to ensure nothing is                            bleeding within the gastric wall or within the cyst                            cavity. Could require IR for treatment if bleeding.                            Happy to be called if questions arise however.                           - The findings and recommendations were discussed                            with the patient.                           - The findings and recommendations were discussed                            with the patient's family.                           - The findings and recommendations were discussed                            with the referring physician. Procedure Code(s):        --- Professional ---                           [phone], Esophagogastroduodenoscopy, flexible,                            transoral; with transmural drainage of pseudocyst                            (includes placement of transmural drainage                            catheter[s]/stent[s], when performed, and                            endoscopic ultrasound, when performed)                           43245, Esophagogastroduodenoscopy, flexible,                            transoral; with dilation of gastric/duodenal  stricture(s) (eg, balloon, bougie)                           [id number], Unlisted procedure, pancreas Diagnosis Code(s):        --- Professional ---                           K20.90, Esophagitis, unspecified without bleeding                           K31.89, Other diseases of stomach and duodenum                           K29.80, Duodenitis without bleeding                           K86.2, Cyst of pancreas                           K85.90, Acute pancreatitis without necrosis or                            infection, unspecified                           K86.89, Other specified diseases of pancreas CPT copyright 2019 [employer]. All rights reserved. The codes documented in this report are preliminary and upon coder review may  be revised to meet current compliance requirements. Justice Britain, MD 05/28/2019 2:38:26 PM Number of Addenda: 0

## 2019-05-28 NOTE — Progress Notes (Signed)
OT Cancellation Note  Patient Details Name: Caleb Chambers MRN: [id number] DOB: [birth year]   Cancelled Treatment:    Reason Eval/Treat Not Completed: Patient declined, no reason specified. Will continue efforts.  Evern Bio 05/28/2019, 8:51 AM  Martie Round, OTR/L Acute Rehabilitation Services Pager: [phone] Office: [phone]

## 2019-05-28 NOTE — Interval H&P Note (Signed)
History and Physical Interval Note:  05/28/2019 12:25 PM  Caleb Chambers  has presented today for surgery, with the diagnosis of Pancreatic pseudocyst.  The various methods of treatment have been discussed with the patient and family. After consideration of risks, benefits and other options for treatment, the patient has consented to  Procedure(s): ESOPHAGOGASTRODUODENOSCOPY (EGD) WITH PROPOFOL (N/A) UPPER ENDOSCOPIC ULTRASOUND (EUS) LINEAR (Left) CYST ENTEROSTOMY (N/A) as a surgical intervention.  The patient's history has been reviewed, patient examined, no change in status, stable for surgery.  I have reviewed the patient's chart and labs.  Questions were answered to the patient's satisfaction.     [employer]

## 2019-05-28 NOTE — Anesthesia Preprocedure Evaluation (Addendum)
Anesthesia Evaluation  Patient identified by MRN, date of birth, ID band Patient awake    Reviewed: Allergy & Precautions, NPO status , Patient's Chart, lab work & pertinent test results  History of Anesthesia Complications Negative for: history of anesthetic complications  Airway Mallampati: IV  TM Distance: >3 FB Neck ROM: Full    Dental  (+) Poor Dentition   Pulmonary neg recent URI,     + decreased breath sounds      Cardiovascular hypertension, Pt. on medications (-) angina(-) Past MI and (-) CHF (-) dysrhythmias  Rhythm:Regular Rate:Tachycardia     Neuro/Psych    GI/Hepatic negative GI ROS, Neg liver ROS,   Endo/Other  diabetes, Insulin DependentMorbid obesityPancreatic pseudocyst  Renal/GU ARFRenal disease     Musculoskeletal negative musculoskeletal ROS (+)   Abdominal   Peds  Hematology  (+) Blood dyscrasia, anemia ,   Anesthesia Other Findings   Reproductive/Obstetrics                            Anesthesia Physical Anesthesia Plan  ASA: III  Anesthesia Plan: General   Post-op Pain Management:    Induction: Intravenous  PONV Risk Score and Plan: Ondansetron and Dexamethasone  Airway Management Planned: Oral ETT  Additional Equipment: None  Intra-op Plan:   Post-operative Plan: Extubation in OR  Informed Consent: I have reviewed the patients History and Physical, chart, labs and discussed the procedure including the risks, benefits and alternatives for the proposed anesthesia with the patient or authorized representative who has indicated his/her understanding and acceptance.     Dental advisory given  Plan Discussed with: CRNA and Surgeon  Anesthesia Plan Comments:         Anesthesia Quick Evaluation

## 2019-05-28 NOTE — Plan of Care (Signed)
  Problem: Education: Goal: Ability to describe self-care measures that may prevent or decrease complications (Diabetes Survival Skills Education) will improve Outcome: Progressing Goal: Individualized Educational Video(s) Outcome: Progressing   Problem: Cardiac: Goal: Ability to maintain an adequate cardiac output will improve Outcome: Progressing   Problem: Health Behavior/Discharge Planning: Goal: Ability to identify and utilize available resources and services will improve Outcome: Progressing Goal: Ability to manage health-related needs will improve Outcome: Progressing   Problem: Fluid Volume: Goal: Ability to achieve a balanced intake and output will improve Outcome: Progressing   Problem: Metabolic: Goal: Ability to maintain appropriate glucose levels will improve Outcome: Progressing   Problem: Nutritional: Goal: Maintenance of adequate nutrition will improve Outcome: Progressing Goal: Maintenance of adequate weight for body size and type will improve Outcome: Progressing   Problem: Respiratory: Goal: Will regain and/or maintain adequate ventilation Outcome: Progressing   Problem: Urinary Elimination: Goal: Ability to achieve and maintain adequate renal perfusion and functioning will improve Outcome: Progressing   Problem: Education: Goal: Knowledge of General Education information will improve Description: Including pain rating scale, medication(s)/side effects and non-pharmacologic comfort measures Outcome: Progressing   Problem: Health Behavior/Discharge Planning: Goal: Ability to manage health-related needs will improve Outcome: Progressing   Problem: Clinical Measurements: Goal: Ability to maintain clinical measurements within normal limits will improve Outcome: Progressing Goal: Will remain free from infection Outcome: Progressing Goal: Diagnostic test results will improve Outcome: Progressing Goal: Respiratory complications will improve Outcome:  Progressing Goal: Cardiovascular complication will be avoided Outcome: Progressing   Problem: Activity: Goal: Risk for activity intolerance will decrease Outcome: Progressing   Problem: Nutrition: Goal: Adequate nutrition will be maintained Outcome: Progressing   Problem: Coping: Goal: Level of anxiety will decrease Outcome: Progressing   Problem: Pain Managment: Goal: General experience of comfort will improve Outcome: Progressing   Problem: Safety: Goal: Ability to remain free from injury will improve Outcome: Progressing

## 2019-05-28 NOTE — Progress Notes (Signed)
Brief GI Progress Update  I had an opportunity to discuss the potential EUS cyst gastrostomy creation with the patient this morning.  I have briefly spoken to him this past weekend when I was covering Dr. Haywood Pao service about the possibility based on repeat imaging whether he would benefit from a potential cyst gastrostomy creation.  It looks like the patient's CT suggests either a fistula has developed a burst infective walled off necrosis.  Thankfully, he has been on antibiotics and is not showing an overt leukocytosis.  With that being said he continues to have significant sinus tachycardia and overall has plateaued/stagnated in his benefit overall.  I think an attempt at C EUS is gastrostomy creation is reasonable.  I have looked at the images, there does look to be potential few windows for [place] that are less than 1 cm in thickness that would allow the axial stent to function properly.  However if the gastric wall and pancreas cyst wall are greater than a centimeter in size it may not be able to place an AXIOS safely.  That were the case and the question would be would he be transferred to a quaternary care center where they could do the less often used cyst gastrostomy creation via cystotome versus IR guided drainage catheter placement.  The risks of EUS including bleeding, infection, aspiration pneumonia and intestinal perforation were discussed as was the possibility it may not give a definitive diagnosis.  If a sample/biopsy of the pancreas or pancreas cyst is done as part of the EUS, there is an additional risk of pancreatitis at the rate of about 1%.  It was explained that procedure related pancreatitis is typically mild, although can be severe and even life threatening.  The risks and benefits of endoscopic evaluation were discussed with the patient; these include but are not limited to the risk of perforation, infection, bleeding, missed lesions, lack of diagnosis, severe illness requiring  hospitalization, as well as anesthesia and sedation related illnesses.  The patient is agreeable to proceed.  I will see the patient this afternoon for attempt at EUS cyst gastrostomy.  He will go to general anesthesia for deep sedation for our attempt.  We will continue current antibiotics.  Corliss Parish, MD [facility] Office # [phone]

## 2019-05-28 NOTE — Anesthesia Procedure Notes (Signed)
Procedure Name: Intubation Date/Time: 05/28/2019 12:45 PM Performed by: Milford Cage, CRNA Pre-anesthesia Checklist: Patient identified, Emergency Drugs available, Suction available, Patient being monitored and Timeout performed Patient Re-evaluated:Patient Re-evaluated prior to induction Oxygen Delivery Method: Circle system utilized Preoxygenation: Pre-oxygenation with 100% oxygen Induction Type: IV induction Laryngoscope Size: Mac and 4 Grade View: Grade I Tube type: Oral Tube size: 7.5 mm Number of attempts: 1 Airway Equipment and Method: Stylet Placement Confirmation: ETT inserted through vocal cords under direct vision,  positive ETCO2,  CO2 detector and breath sounds checked- equal and bilateral Secured at: 23 cm Tube secured with: Tape Dental Injury: Teeth and Oropharynx as per pre-operative assessment  Comments: Intubated by SRNA T. [place]

## 2019-05-28 NOTE — Progress Notes (Signed)
PROGRESS NOTE  MAYSIN CARSTENS NGE:[id number] DOB: 04-10-1966 DOA: 05/12/2019 PCP: Patient, No Pcp Per   LOS: 16 days   Brief narrative: Patient is a 54 year old African-American male, morbidly obese, with past medical history significant for hypertension, DM, gout; and recent admission for pancreatitis and DKA.  Patient presented to the hospital with complaints of minimal oral intake since his discharge.  Patient reported shortness of breath and decreased ability to eat and drink.  He has not been fully compliant with insulin regimen as well.  Patient was then admitted to the hospital again for DKA and acute kidney injury, prerenal.  DKA and acute kidney injury have resolved.  Patient is currently being managed for complications of severe pancreatitis.  Assessment/Plan:  Principal Problem:   DKA (diabetic ketoacidosis) (HCC) Active Problems:   Acute pancreatitis   Acute kidney injury (HCC)   Morbid obesity (HCC)   Debility   Protein-calorie malnutrition, severe  Severe pancreatitis with peripancreatic fluid collection With concern for necrotizing pancreatitis and questionable superimposed infection Repeat CT abdomen pelvis 05/25/2019 showed persistent pancreatitis with new scattered peripancreatic gas,extensive ill-defined peripancreatic fluid extending throughout transverse colon into the root of mesentery. S/p cholecystectomy -Continue MS Contin dose to 30 mg twice daily for pain control, continue as needed IV hydromorphone -GI on board.  Appreciate recommendations.  Started on Cipro/Flagyl on 05/25/2019.  Underwent endoscopic drainage on 05/28/2019.  Culture from drainage pending.  NJ tube removed for the procedure.  Plan for clear liquid diet for a day to see if he tolerates.  If he continues to have poor oral intake, will replace NG tube to provide nutrition given degree of malnutrition until next procedure.  Tentative plan for repeat procedure on Monday, 06/01/2019 -New onset diarrhea,  stool C. difficile negative.  Likely related to tube feeds or secondary to worsening pancreatitis.  Albumin low ?  Negative acute phase reactant.  Recurrent DKA -Resolved.   -Blood sugars reasonably controlled.   -Hemoglobin A1c was 13.2 on 04/22/2019  -Currently on basal bolus and subcu insulin.  -Will need dose adjustment given he is now off tube feeds with attempt at clear liquids diet  Sinus tachycardia: Likely secondary to systemic inflammatory response syndrome and pain - pain control -Management of pancreatitis as above  History of hypertension: -Controlled.   -Continue to monitor closely.   Hypokalemia Replenished.  Hypomagnesemia: -Continue to monitor closely.     Hypophosphatemia.  Replenished.    Acute kidney injury on presentation: -Resolved.     Morbid obesity -BMI 43.4 -Further management on outpatient visit.  Debility and deconditioning. PT recommend CIR  Severe protein calorie malnutrition: -Caloric count  -Dietary team input is appreciated.     VTE Prophylaxis: Hold Lovenox until Monday for GI procedure to decrease risk of bleed  Code Status:  Full code  Family Communication:   Disposition Plan:  Patient is from home. Patient has been seen by physical therapy who recommended CIR.  Likely disposition to CIR when medically stable. Barriers to discharge include improvement of severe pancreatitis, need for procedure, needing IV Dilaudid for abdominal pain. He is at high risk of decompensation and overall prognosis is guarded.  Consultants:  GI  Interventional radiology  Procedures:  Cortrak feeding tube placement 05/14/2019  Antibiotics:   Anti-infectives (From admission, onward)   Start     Dose/Rate Route Frequency Ordered Stop   05/25/19 1500  ciprofloxacin (CIPRO) IVPB 400 mg     400 mg 200 mL/hr over 60 Minutes Intravenous Every 12  hours 05/25/19 1408     05/25/19 1500  metroNIDAZOLE (FLAGYL) IVPB 500 mg     500 mg 100 mL/hr over  60 Minutes Intravenous Every 8 hours 05/25/19 1408       Subjective: Abdominal pain much better.  Denies nausea.  Objective: Vitals:   05/27/19 2358 05/28/19 0335  BP: 136/89 118/66  Pulse: (!) 123 (!) 120  Resp: (!) 24 (!) 22  Temp: 99.4 F (37.4 C) 99.1 F (37.3 C)  SpO2: 96% 98%    Intake/Output Summary (Last 24 hours) at 05/28/2019 0800 Last data filed at 05/28/2019 2595 Gross per 24 hour  Intake 828.83 ml  Output 402 ml  Net 426.83 ml   Filed Weights   05/26/19 0338 05/27/19 0450 05/28/19 0335  Weight: (!) 136.9 kg (!) 138 kg (!) 138.7 kg   Body mass index is 41.47 kg/m.   Physical Exam:  General: Morbidly obese.  Awake and alert. Not in distress  Lying down HEENT: Pallor.  No jaundice.  Dry buccal mucosa. Chest:  Clear to auscultation.    CVS: S1 &S2  Abdomen: Morbidly obese, soft and tender.  Bowel sounds present CNS: Patient is awake and alert.  Patient moves all extremities.  Extremities: Fullness of the ankle without any overt edema.    Data Review: I have personally reviewed the following laboratory data and studies,  CBC: Recent Labs  Lab 05/22/19 0156 05/24/19 0248 05/26/19 0509 05/27/19 0224 05/28/19 0306  WBC 4.8 7.4 6.2 7.4 9.5  NEUTROABS 3.8 5.1 4.0 4.7 7.9*  HGB 8.9* 8.4* 8.4* 7.6* 7.9*  HCT 28.3* 26.5* 26.6* 24.9* 25.6*  MCV 76.1* 75.7* 75.4* 77.6* 76.4*  PLT 245 282 278 329 341   Basic Metabolic Panel: Recent Labs  Lab 05/22/19 0156 05/24/19 0248 05/26/19 0509 05/27/19 0224 05/28/19 0306  NA 141 137 134* 135 134*  K 4.3 4.3 4.0 3.8 4.2  CL 103 102 100 101 101  CO2 26 24 24 25 25   GLUCOSE 226* 207* 125* 219* 220*  BUN 17 15 11 10 7   CREATININE 0.89 0.87 0.90 0.84 0.84  CALCIUM 8.4* 8.2* 7.8* 7.9* 8.0*  MG 1.7 1.7  --  1.6*  --   PHOS  --  3.4  --   --   --    Liver Function Tests: Recent Labs  Lab 05/22/19 0156 05/24/19 0248 05/26/19 0509 05/27/19 0224  AST 30 24 18 15   ALT 30 29 22 17   ALKPHOS 121 123 92 86    BILITOT 0.5 0.3 0.3 0.6  PROT 7.3 6.7 7.0 6.9  ALBUMIN 1.4* 1.2* 1.1* 1.0*   No results for input(s): LIPASE, AMYLASE in the last 168 hours. No results for input(s): AMMONIA in the last 168 hours. Cardiac Enzymes: No results for input(s): CKTOTAL, CKMB, CKMBINDEX, TROPONINI in the last 168 hours. BNP (last 3 results) Recent Labs    04/23/19 0450  BNP 99.2    ProBNP (last 3 results) No results for input(s): PROBNP in the last 8760 hours.  CBG: Recent Labs  Lab 05/27/19 0345 05/27/19 0915 05/27/19 1447 05/28/19 0157 05/28/19 0338  GLUCAP 222* 166* 187* 217* 176*   Recent Results (from the past 240 hour(s))  C difficile quick scan w PCR reflex     Status: None   Collection Time: 05/25/19  8:15 AM   Specimen: STOOL  Result Value Ref Range Status   C Diff antigen NEGATIVE NEGATIVE Final   C Diff toxin NEGATIVE NEGATIVE Final  C Diff interpretation No C. difficile detected.  Final    Comment: Performed at [facility], [place] [address] [place], [place] [address]     Studies: No results found.  Scheduled Meds: . allopurinol  300 mg Oral Daily  . chlorhexidine  15 mL Mouth Rinse BID  . feeding supplement (PRO-STAT SUGAR FREE 64)  30 mL Per Tube TID  . insulin aspart  0-20 Units Subcutaneous Q6H  . insulin aspart  10 Units Subcutaneous Q6H  . insulin glargine  18 Units Subcutaneous QHS  . insulin glargine  42 Units Subcutaneous Daily  . mouth rinse  15 mL Mouth Rinse q12n4p  . morphine  30 mg Oral Q12H  . pantoprazole (PROTONIX) IV  40 mg Intravenous Q12H    Continuous Infusions: . sodium chloride    . ciprofloxacin 400 mg (05/28/19 0401)  . feeding supplement (VITAL 1.5 CAL) Stopped (05/28/19 0030)  . metronidazole 500 mg (05/28/19 0620)   Spent more than 30 minutes in coordinating care for this patient including bedside patient care.  Lucky Cowboy, MD  [facility] 05/28/2019

## 2019-05-28 NOTE — Progress Notes (Signed)
PT Cancellation Note  Patient Details Name: Caleb Chambers MRN: [id number] DOB: 1966/02/06   Cancelled Treatment:    Reason Eval/Treat Not Completed: Patient declined, no reason specified PT will continue to follow acutely.    Erline Levine, PTA Acute Rehabilitation Services Pager: [phone] Office: [phone]   05/28/2019, 8:49 AM

## 2019-05-28 NOTE — Transfer of Care (Signed)
Immediate Anesthesia Transfer of Care Note  Patient: Caleb Chambers  Procedure(s) Performed: ESOPHAGOGASTRODUODENOSCOPY (EGD) WITH PROPOFOL (N/A ) UPPER ENDOSCOPIC ULTRASOUND (EUS) LINEAR (Left ) CYST ENTEROSTOMY (N/A ) PANCREATIC STENT PLACEMENT BILIARY STENT PLACEMENT  Patient Location: Endoscopy Unit  Anesthesia Type:General  Level of Consciousness: drowsy and responds to stimulation  Airway & Oxygen Therapy: Patient Spontanous Breathing and Patient connected to face mask oxygen  Post-op Assessment: Report given to RN, Post -op Vital signs reviewed and stable and Patient moving all extremities  Post vital signs: Reviewed and stable  Last Vitals:  Vitals Value Taken Time  BP    Temp    Pulse 133 05/28/19 1414  Resp 23 05/28/19 1414  SpO2 90 % 05/28/19 1414  Vitals shown include unvalidated device data.  Last Pain:  Vitals:   05/28/19 1205  TempSrc: Oral  PainSc: 0-No pain      Patients Stated Pain Goal: 2 (05/25/19 1953)  Complications: No apparent anesthesia complications

## 2019-05-29 LAB — IRON AND TIBC
Iron: 35 ug/dL — ABNORMAL LOW (ref 45–182)
Saturation Ratios: 32 % (ref 17.9–39.5)
TIBC: 109 ug/dL — ABNORMAL LOW (ref 250–450)
UIBC: 74 ug/dL

## 2019-05-29 LAB — VITAMIN B12: Vitamin B-12: 1437 pg/mL — ABNORMAL HIGH (ref 180–914)

## 2019-05-29 LAB — RETICULOCYTES
Immature Retic Fract: 31.7 % — ABNORMAL HIGH (ref 2.3–15.9)
RBC.: 3.35 MIL/uL — ABNORMAL LOW (ref 4.22–5.81)
Retic Count, Absolute: 51.3 10*3/uL (ref 19.0–186.0)
Retic Ct Pct: 1.5 % (ref 0.4–3.1)

## 2019-05-29 LAB — CBC WITH DIFFERENTIAL/PLATELET
Abs Immature Granulocytes: 0.12 10*3/uL — ABNORMAL HIGH (ref 0.00–0.07)
Basophils Absolute: 0 10*3/uL (ref 0.0–0.1)
Basophils Relative: 0 %
Eosinophils Absolute: 0.1 10*3/uL (ref 0.0–0.5)
Eosinophils Relative: 1 %
HCT: 24.6 % — ABNORMAL LOW (ref 39.0–52.0)
Hemoglobin: 7.6 g/dL — ABNORMAL LOW (ref 13.0–17.0)
Immature Granulocytes: 2 %
Lymphocytes Relative: 26 %
Lymphs Abs: 1.9 10*3/uL (ref 0.7–4.0)
MCH: 23.5 pg — ABNORMAL LOW (ref 26.0–34.0)
MCHC: 30.9 g/dL (ref 30.0–36.0)
MCV: 76.2 fL — ABNORMAL LOW (ref 80.0–100.0)
Monocytes Absolute: 0.6 10*3/uL (ref 0.1–1.0)
Monocytes Relative: 9 %
Neutro Abs: 4.4 10*3/uL (ref 1.7–7.7)
Neutrophils Relative %: 62 %
Platelets: 302 10*3/uL (ref 150–400)
RBC: 3.23 MIL/uL — ABNORMAL LOW (ref 4.22–5.81)
RDW: 15.8 % — ABNORMAL HIGH (ref 11.5–15.5)
WBC: 7.2 10*3/uL (ref 4.0–10.5)
nRBC: 0.7 % — ABNORMAL HIGH (ref 0.0–0.2)

## 2019-05-29 LAB — BASIC METABOLIC PANEL
Anion gap: 8 (ref 5–15)
BUN: 8 mg/dL (ref 6–20)
CO2: 24 mmol/L (ref 22–32)
Calcium: 7.8 mg/dL — ABNORMAL LOW (ref 8.9–10.3)
Chloride: 104 mmol/L (ref 98–111)
Creatinine, Ser: 0.78 mg/dL (ref 0.61–1.24)
GFR calc Af Amer: >60 mL/min (ref >60)
GFR calc non Af Amer: >60 mL/min (ref >60)
Glucose, Bld: 77 mg/dL (ref 70–99)
Potassium: 3.9 mmol/L (ref 3.5–5.1)
Sodium: 136 mmol/L (ref 135–145)

## 2019-05-29 LAB — FOLATE: Folate: 9.2 ng/mL (ref >5.9)

## 2019-05-29 LAB — GLUCOSE, CAPILLARY
Glucose-Capillary: 112 mg/dL — ABNORMAL HIGH (ref 70–99)
Glucose-Capillary: 89 mg/dL (ref 70–99)
Glucose-Capillary: 96 mg/dL (ref 70–99)
Glucose-Capillary: 131 mg/dL — ABNORMAL HIGH (ref 70–99)

## 2019-05-29 LAB — FERRITIN: Ferritin: 1331 ng/mL — ABNORMAL HIGH (ref 24–336)

## 2019-05-29 MED ORDER — VANCOMYCIN HCL 1750 MG/350ML IV SOLN: 1750.0000 mg | Freq: Two times a day (BID) | INTRAVENOUS | Status: DC

## 2019-05-29 MED ORDER — VANCOMYCIN HCL 10 G IV SOLR
2500.0000 mg | Freq: Once | INTRAVENOUS | Status: AC
  Administered 2019-05-29: 2500 mg via INTRAVENOUS
  Filled 2019-05-29: qty 2500

## 2019-05-29 MED ORDER — PANCRELIPASE (LIP-PROT-AMYL) 12000-38000 UNITS PO CPEP
36000.0000 [IU] | ORAL_CAPSULE | Freq: Three times a day (TID) | ORAL | Status: DC
  Administered 2019-05-29 – 2019-06-22 (×46): 36000 [IU] via ORAL
  Filled 2019-05-29 (×49): qty 3

## 2019-05-29 MED ORDER — INSULIN GLARGINE 100 UNIT/ML ~~LOC~~ SOLN
5.0000 [IU] | Freq: Every day | SUBCUTANEOUS | Status: DC
  Administered 2019-05-29 – 2019-05-31 (×3): 5 [IU] via SUBCUTANEOUS
  Filled 2019-05-29 (×3): qty 0.05

## 2019-05-29 MED ORDER — DIPHENOXYLATE-ATROPINE 2.5-0.025 MG PO TABS
2.0000 | ORAL_TABLET | Freq: Two times a day (BID) | ORAL | Status: DC
  Administered 2019-05-29 – 2019-06-19 (×25): 2 via ORAL
  Filled 2019-05-29 (×28): qty 2

## 2019-05-29 MED ORDER — VANCOMYCIN HCL 1750 MG/350ML IV SOLN
1750.0000 mg | Freq: Two times a day (BID) | INTRAVENOUS | Status: DC
  Administered 2019-05-30 – 2019-06-05 (×12): 1750 mg via INTRAVENOUS
  Filled 2019-05-29 (×14): qty 350

## 2019-05-29 NOTE — Plan of Care (Signed)
  Problem: Education: Goal: Ability to describe self-care measures that may prevent or decrease complications (Diabetes Survival Skills Education) will improve Outcome: Progressing Goal: Individualized Educational Video(s) Outcome: Progressing   Problem: Cardiac: Goal: Ability to maintain an adequate cardiac output will improve Outcome: Progressing   Problem: Health Behavior/Discharge Planning: Goal: Ability to identify and utilize available resources and services will improve Outcome: Progressing Goal: Ability to manage health-related needs will improve Outcome: Progressing   Problem: Fluid Volume: Goal: Ability to achieve a balanced intake and output will improve Outcome: Progressing   Problem: Metabolic: Goal: Ability to maintain appropriate glucose levels will improve Outcome: Progressing   Problem: Nutritional: Goal: Maintenance of adequate nutrition will improve Outcome: Progressing Goal: Maintenance of adequate weight for body size and type will improve Outcome: Progressing   Problem: Respiratory: Goal: Will regain and/or maintain adequate ventilation Outcome: Progressing   Problem: Urinary Elimination: Goal: Ability to achieve and maintain adequate renal perfusion and functioning will improve Outcome: Progressing   Problem: Education: Goal: Knowledge of General Education information will improve Description Including pain rating scale, medication(s)/side effects and non-pharmacologic comfort measures Outcome: Progressing   Problem: Health Behavior/Discharge Planning: Goal: Ability to manage health-related needs will improve Outcome: Progressing   Problem: Clinical Measurements: Goal: Ability to maintain clinical measurements within normal limits will improve Outcome: Progressing Goal: Will remain free from infection Outcome: Progressing Goal: Diagnostic test results will improve Outcome: Progressing Goal: Respiratory complications will improve Outcome:  Progressing Goal: Cardiovascular complication will be avoided Outcome: Progressing   Problem: Activity: Goal: Risk for activity intolerance will decrease Outcome: Progressing   Problem: Nutrition: Goal: Adequate nutrition will be maintained Outcome: Progressing   Problem: Elimination: Goal: Will not experience complications related to bowel motility Outcome: Progressing Goal: Will not experience complications related to urinary retention Outcome: Progressing   Problem: Pain Managment: Goal: General experience of comfort will improve Outcome: Progressing   Problem: Safety: Goal: Ability to remain free from injury will improve Outcome: Progressing   Problem: Skin Integrity: Goal: Risk for impaired skin integrity will decrease Outcome: Progressing   

## 2019-05-29 NOTE — Progress Notes (Signed)
OT Cancellation Note  Patient Details Name: Caleb Chambers MRN: [id number] DOB: 04/16/1966   Cancelled Treatment:    Reason Eval/Treat Not Completed: (Pt declined due feeling like he is going to vomit.)  Evern Bio 05/29/2019, 1:39 PM  Martie Round, OTR/L Acute Rehabilitation Services Pager: [phone] Office: [phone]

## 2019-05-29 NOTE — Progress Notes (Signed)
Subjective: Feeling better in his abdomen.  Objective: Vital signs in last 24 hours: Temp:  [97.8 F (36.6 C)-100.6 F (38.1 C)] 98.5 F (36.9 C) (02/12 0334) Pulse Rate:  [103-134] 104 (02/12 0334) Resp:  [14-27] 18 (02/12 0334) BP: (109-135)/(60-71) 117/71 (02/12 0334) SpO2:  [90 %-99 %] 97 % (02/12 0334) Weight:  [138.6 kg-138.7 kg] 138.6 kg (02/12 0619) Last BM Date: 05/27/19  Intake/Output from previous day: 02/11 0701 - 02/12 0700 In: 1340 [P.O.:240; I.V.:1100] Out: 1550 [Urine:550] Intake/Output this shift: No intake/output data recorded.  General appearance: alert and no distress GI: soft, non-tender; bowel sounds normal; no masses,  no organomegaly  Lab Results: Recent Labs    05/27/19 0224 05/28/19 0306 05/29/19 0124  WBC 7.4 9.5 7.2  HGB 7.6* 7.9* 7.6*  HCT 24.9* 25.6* 24.6*  PLT 329 341 302   BMET Recent Labs    05/27/19 0224 05/28/19 0306 05/29/19 0124  NA 135 134* 136  K 3.8 4.2 3.9  CL 101 101 104  CO2 25 25 24   GLUCOSE 219* 220* 77  BUN 10 7 8   CREATININE 0.84 0.84 0.78  CALCIUM 7.9* 8.0* 7.8*   LFT Recent Labs    05/27/19 0224  PROT 6.9  ALBUMIN 1.0*  AST 15  ALT 17  ALKPHOS 86  BILITOT 0.6   PT/INR Recent Labs    05/28/19 0306  LABPROT 16.6*  INR 1.4*   Hepatitis Panel No results for input(s): HEPBSAG, HCVAB, HEPAIGM, HEPBIGM in the last 72 hours. C-Diff No results for input(s): CDIFFTOX in the last 72 hours. Fecal Lactopherrin No results for input(s): FECLLACTOFRN in the last 72 hours.  Studies/Results: No results found.  Medications:  Scheduled: . allopurinol  300 mg Oral Daily  . chlorhexidine  15 mL Mouth Rinse BID  . diphenoxylate-atropine  2 tablet Oral BID  . feeding supplement (PRO-STAT SUGAR FREE 64)  30 mL Per Tube TID  . insulin aspart  0-20 Units Subcutaneous TID WC  . insulin aspart  0-5 Units Subcutaneous QHS  . insulin glargine  10 Units Subcutaneous QHS  . insulin glargine  25 Units Subcutaneous  Daily  . mouth rinse  15 mL Mouth Rinse q12n4p  . morphine  30 mg Oral Q12H   Continuous: . ciprofloxacin 400 mg (05/29/19 0358)  . feeding supplement (VITAL 1.5 CAL) Stopped (05/28/19 0030)  . metronidazole 500 mg (05/29/19 07/26/19)    Assessment/Plan: 1) Severe acute pancreatitis. 2) Infected pancreatic cyst/necrosis. 3) Severe malnutrition. 4) Diarrhea.   Clinically he is feeling better after the cyst drainage.  Dr. 07/27/19 intervention and management is greatly appreciated.  His tachycardia has improved.  The plan is for necrosectomy on Monday.  He still does not have an appetite, but he was encouraged to try clear liquids.  Since he is going to undergo a repeat endoscopic procedure on Monday, a reinsertion of the tube feeding will be delayed until after Monday's procedure.  His last albumin was at 1.0.  His diarrhea persists and there is no evidence of an infection.  This issue is most likely exacerbated with the Cipro/Flagyl.  Plan: 1) Necrosectomy on Monday. 2) Lomotil two tabs BID. 3) Encouraged clear liquids.  If he has pain, then he has to be placed back on an NPO status. 4) Trial of Creon.  LOS: 17 days   Donni Oglesby D 05/29/2019, 7:36 AM

## 2019-05-29 NOTE — Progress Notes (Signed)
PROGRESS NOTE  Caleb Chambers ZOX:[id number] DOB: 1965/10/23 DOA: 05/12/2019 PCP: Patient, No Pcp Per   LOS: 17 days   Brief narrative: Patient is a 54 year old African-American male, morbidly obese, with past medical history significant for hypertension, DM, gout; and recent admission for pancreatitis and DKA.  Patient presented to the hospital with complaints of minimal oral intake since his discharge.  Patient reported shortness of breath and decreased ability to eat and drink.  He has not been fully compliant with insulin regimen as well.  Patient was then admitted to the hospital again for DKA and acute kidney injury, prerenal.  DKA and acute kidney injury have resolved.  Patient is currently being managed for complications of severe pancreatitis.  Assessment/Plan:  Principal Problem:   DKA (diabetic ketoacidosis) (HCC) Active Problems:   Acute pancreatitis   Acute kidney injury (HCC)   Morbid obesity (HCC)   Debility   Protein-calorie malnutrition, severe  Severe pancreatitis with peripancreatic fluid collection With concern for necrotizing pancreatitis and questionable superimposed infection Repeat CT abdomen pelvis 05/25/2019 showed persistent pancreatitis with new scattered peripancreatic gas,extensive ill-defined peripancreatic fluid extending throughout transverse colon into the root of mesentery. S/p cholecystectomy -Continue MS Contin dose to 30 mg twice daily for pain control, continue as needed IV hydromorphone -GI on board.  Appreciate recommendations.  Started on Cipro/Flagyl on 05/25/2019.  Underwent endoscopic drainage on 05/28/2019.  Culture from drainage pending.  NJ tube removed for the procedure. Per Dr. Meridee Score, plan for clear liquid diet for a day to see if he tolerates.  If he continues to have poor oral intake, recommended replacing NG tube to provide nutrition given degree of malnutrition until next procedure but patient declined NG tube placement.  Tentative plan  for repeat procedure on Monday, 06/01/2019 -Drainage from pancreatic pseudocyst is growing moderate staph aureus, pending susceptibilities.  Add vancomycin 2/12 -New onset diarrhea, stool C. difficile negative.  Likely related to tube feeds or secondary to worsening pancreatitis.  Albumin low ?  Negative acute phase reactant.  Creon and Lomotil added by GI.  Recurrent DKA -Resolved.   -Blood sugars reasonably controlled.   -Hemoglobin A1c was 13.2 on 04/22/2019  -Currently on basal, correctional insulin  -Significant dose reduction in insulin required as he is off tube feeds and has poor p.o. intake  Sinus tachycardia Much improved since cystostomy  Likely secondary to systemic inflammatory response syndrome and pain - pain control -Management of pancreatitis as above  History of hypertension: -Controlled.   -Continue to monitor closely.   Hypokalemia Replenished.  Hypomagnesemia: -Continue to monitor closely.     Hypophosphatemia Replace as needed  Hypochromic anemia No obvious bleeding Lab work suggestive of anemia of chronic disease in setting of inflammation  Acute kidney injury on presentation: -Resolved.     Morbid obesity -BMI 43.4 -Further management on outpatient visit.  Debility and deconditioning. PT recommend CIR  Severe protein calorie malnutrition: -Caloric count  -Dietary team input is appreciated.     VTE Prophylaxis: Hold Lovenox until Monday for GI procedure to decrease risk of bleed  Code Status:  Full code  Family Communication:   Disposition Plan:  Patient is from home. Patient has been seen by physical therapy who recommended CIR.  Likely disposition to CIR when medically stable. Barriers to discharge include improvement of severe pancreatitis, need for procedure, needing IV Dilaudid for abdominal pain. He is at high risk of decompensation and overall prognosis is guarded.  Consultants:  GI  Interventional  radiology  Procedures:  Cortrak feeding tube placement 05/14/2019  Antibiotics:   Anti-infectives (From admission, onward)   Start     Dose/Rate Route Frequency Ordered Stop   05/25/19 1500  ciprofloxacin (CIPRO) IVPB 400 mg     400 mg 200 mL/hr over 60 Minutes Intravenous Every 12 hours 05/25/19 1408     05/25/19 1500  metroNIDAZOLE (FLAGYL) IVPB 500 mg     500 mg 100 mL/hr over 60 Minutes Intravenous Every 8 hours 05/25/19 1408       Subjective: Abdominal pain much better.  Denies nausea.  Does not feel like eating much.  Discussed putting the feeding tube in tomorrow if he has a poor p.o. intake or has abdominal pain with food but he declines.  Objective: Vitals:   05/29/19 0756 05/29/19 1108  BP: 107/74 125/80  Pulse: (!) 111   Resp: 17 17  Temp: (!) 97.4 F (36.3 C) 98.7 F (37.1 C)  SpO2: 98%     Intake/Output Summary (Last 24 hours) at 05/29/2019 1512 Last data filed at 05/28/2019 2100 Gross per 24 hour  Intake 240 ml  Output 350 ml  Net -110 ml   Filed Weights   05/28/19 0335 05/28/19 1205 05/29/19 0619  Weight: (!) 138.7 kg (!) 138.7 kg (!) 138.6 kg   Body mass index is 41.44 kg/m.   Physical Exam:  General: Morbidly obese.  Awake and alert. Not in distress  Lying down HEENT: Pallor.  No jaundice.  Dry buccal mucosa. Chest:  Clear to auscultation.    CVS: S1 &S2  Abdomen: Morbidly obese, soft and tender.  Bowel sounds present CNS: Patient is awake and alert.  Patient moves all extremities.  Extremities: Fullness of the ankle without any overt edema.    Data Review: I have personally reviewed the following laboratory data and studies,  CBC: Recent Labs  Lab 05/24/19 0248 05/26/19 0509 05/27/19 0224 05/28/19 0306 05/29/19 0124  WBC 7.4 6.2 7.4 9.5 7.2  NEUTROABS 5.1 4.0 4.7 7.9* 4.4  HGB 8.4* 8.4* 7.6* 7.9* 7.6*  HCT 26.5* 26.6* 24.9* 25.6* 24.6*  MCV 75.7* 75.4* 77.6* 76.4* 76.2*  PLT 282 278 329 341 546   Basic Metabolic Panel: Recent  Labs  Lab 05/24/19 0248 05/26/19 0509 05/27/19 0224 05/28/19 0306 05/29/19 0124  NA 137 134* 135 134* 136  K 4.3 4.0 3.8 4.2 3.9  CL 102 100 101 101 104  CO2 24 24 25 25 24   GLUCOSE 207* 125* 219* 220* 77  BUN 15 11 10 7 8   CREATININE 0.87 0.90 0.84 0.84 0.78  CALCIUM 8.2* 7.8* 7.9* 8.0* 7.8*  MG 1.7  --  1.6*  --   --   PHOS 3.4  --   --   --   --    Liver Function Tests: Recent Labs  Lab 05/24/19 0248 05/26/19 0509 05/27/19 0224  AST 24 18 15   ALT 29 22 17   ALKPHOS 123 92 86  BILITOT 0.3 0.3 0.6  PROT 6.7 7.0 6.9  ALBUMIN 1.2* 1.1* 1.0*   No results for input(s): LIPASE, AMYLASE in the last 168 hours. No results for input(s): AMMONIA in the last 168 hours. Cardiac Enzymes: No results for input(s): CKTOTAL, CKMB, CKMBINDEX, TROPONINI in the last 168 hours. BNP (last 3 results) Recent Labs    04/23/19 0450  BNP 99.2    ProBNP (last 3 results) No results for input(s): PROBNP in the last 8760 hours.  CBG: Recent Labs  Lab 05/28/19 1224 05/28/19 1559  05/28/19 2145 05/29/19 0617 05/29/19 1109  GLUCAP 112* 90 83 89 96   Recent Results (from the past 240 hour(s))  C difficile quick scan w PCR reflex     Status: None   Collection Time: 05/25/19  8:15 AM   Specimen: STOOL  Result Value Ref Range Status   C Diff antigen NEGATIVE NEGATIVE Final   C Diff toxin NEGATIVE NEGATIVE Final   C Diff interpretation No C. difficile detected.  Final    Comment: Performed at [facility], [address], [place], [place] [address]  Aerobic/Anaerobic Culture (surgical/deep wound)     Status: None (Preliminary result)   Collection Time: 05/28/19  1:58 PM   Specimen: PATH GI biopsy; Tissue  Result Value Ref Range Status   Specimen Description FLUID PANCREATIC PSUEDOCYST ASPIRATE  Final   Special Requests NONE  Final   Gram Stain   Final    ABUNDANT WBC PRESENT,BOTH PMN AND MONONUCLEAR ABUNDANT GRAM POSITIVE COCCI    Culture   Final    MODERATE STAPHYLOCOCCUS  AUREUS SUSCEPTIBILITIES TO FOLLOW Performed at [facility], [address] [place], [place] [address]    Report Status PENDING  Incomplete     Studies: No results found.  Scheduled Meds: . allopurinol  300 mg Oral Daily  . chlorhexidine  15 mL Mouth Rinse BID  . diphenoxylate-atropine  2 tablet Oral BID  . feeding supplement (PRO-STAT SUGAR FREE 64)  30 mL Per Tube TID  . insulin aspart  0-20 Units Subcutaneous TID WC  . insulin aspart  0-5 Units Subcutaneous QHS  . insulin glargine  5 Units Subcutaneous Daily  . lipase/protease/amylase  36,000 Units Oral TID AC  . mouth rinse  15 mL Mouth Rinse q12n4p  . morphine  30 mg Oral Q12H    Continuous Infusions: . ciprofloxacin 400 mg (05/29/19 1455)  . feeding supplement (VITAL 1.5 CAL) Stopped (05/28/19 0030)  . metronidazole 500 mg (05/29/19 0658)   Spent more than 30 minutes in coordinating care for this patient including bedside patient care.  Liborio Nixon, MD  [facility] 05/29/2019

## 2019-05-29 NOTE — Progress Notes (Signed)
Pharmacy Antibiotic Note  Caleb Chambers is a 54 y.o. male admitted on 05/12/2019 with DKA, then found to have infected pancreatitic pseudocyst/necrosis.  S/p drainage on 05/28/19 and fluid is growing Staph aureus.  Pharmacy has been consulted to add vancomycin to Cipro and Flagyl.  SCr 0.78, CrCL > 100 ml/min, afebrile, WBC WNL.  Plan: Vanc 2500mg  IV x 1, then 1750mg  IV Q12H for AUC 491 using SCr 0.8 Cipro 400mg  IV Q12H per MD Flagyl 500mg  IV Q8H per MD Monitor renal fxn, micro data, vanc AUC as indicated, abx de-escalation  Height: 6' (182.9 cm) Weight: (!) 305 lb 8.9 oz (138.6 kg) IBW/kg (Calculated) : 77.6  Temp (24hrs), Avg:98.3 F (36.8 C), Min:97.4 F (36.3 C), Max:98.9 F (37.2 C)  Recent Labs  Lab 05/24/19 0248 05/26/19 0509 05/27/19 0224 05/28/19 0306 05/29/19 0124  WBC 7.4 6.2 7.4 9.5 7.2  CREATININE 0.87 0.90 0.84 0.84 0.78    Estimated Creatinine Clearance: 155.8 mL/min (by C-G formula based on SCr of 0.78 mg/dL).    Allergies  Allergen Reactions  . Penicillins Anaphylaxis, Hives and Swelling    Did it involve swelling of the face/tongue/throat, SOB, or low BP? Yes Did it involve sudden or severe rash/hives, skin peeling, or any reaction on the inside of your mouth or nose? Yes Did you need to seek medical attention at a hospital or doctor's office? Yes When did it last happen?teenager If all above answers are "NO", may proceed with cephalosporin use.  . Metoprolol Other (See Comments)    Chest pains - [facility] by [facility] 02/08/14  . Clindamycin Rash and Other (See Comments)    Unknown reaction - reported by [facility] 09-30-13    Vanc 2/12 >> Cipro 2/8 >> Flagyl 2/8 >>  1/26 covid - negative 1/27 MRSA PCR - negative 2/8 C.diff - negative 2/11 pancreatic pseudocyst aspirate - Staph aureus (preliminary)  Marchetta Navratil D. 2/27, PharmD, BCPS, BCCCP 05/29/2019, 3:30 PM

## 2019-05-29 NOTE — Anesthesia Postprocedure Evaluation (Signed)
Anesthesia Post Note  Patient: Caleb Chambers  Procedure(s) Performed: ESOPHAGOGASTRODUODENOSCOPY (EGD) WITH PROPOFOL (N/A ) UPPER ENDOSCOPIC ULTRASOUND (EUS) LINEAR (Left ) CYST ENTEROSTOMY (N/A ) PANCREATIC STENT PLACEMENT BILIARY STENT PLACEMENT     Patient location during evaluation: Endoscopy Anesthesia Type: General Level of consciousness: awake and patient cooperative Pain management: pain level controlled Vital Signs Assessment: post-procedure vital signs reviewed and stable Respiratory status: spontaneous breathing, nonlabored ventilation, respiratory function stable and patient connected to nasal cannula oxygen Cardiovascular status: blood pressure returned to baseline and tachycardic Postop Assessment: no apparent nausea or vomiting Anesthetic complications: no    Last Vitals:  Vitals:   05/29/19 1108 05/29/19 1739  BP: 125/80 130/69  Pulse:  (!) 101  Resp: 17 (!) 24  Temp: 37.1 C   SpO2:  100%    Last Pain:  Vitals:   05/29/19 1739  TempSrc:   PainSc: 0-No pain                 Pixie Burgener

## 2019-05-29 NOTE — Progress Notes (Signed)
PT Cancellation Note  Patient Details Name: BALDOMERO MIRARCHI MRN: [id number] DOB: [birth year]   Cancelled Treatment:    Reason Eval/Treat Not Completed: Patient declined, no reason specified Pt declined participating in therapy at this time due to nausea.  PT will continue to follow acutely.   Erline Levine, PTA Acute Rehabilitation Services Pager: [phone] Office: [phone]   05/29/2019, 1:39 PM

## 2019-05-30 DIAGNOSIS — E43 Unspecified severe protein-calorie malnutrition: Secondary | ICD-10-CM

## 2019-05-30 DIAGNOSIS — K8582 Other acute pancreatitis with infected necrosis: Principal | ICD-10-CM

## 2019-05-30 DIAGNOSIS — R1010 Upper abdominal pain, unspecified: Secondary | ICD-10-CM

## 2019-05-30 LAB — GLUCOSE, CAPILLARY
Glucose-Capillary: 113 mg/dL — ABNORMAL HIGH (ref 70–99)
Glucose-Capillary: 123 mg/dL — ABNORMAL HIGH (ref 70–99)
Glucose-Capillary: 109 mg/dL — ABNORMAL HIGH (ref 70–99)
Glucose-Capillary: 111 mg/dL — ABNORMAL HIGH (ref 70–99)

## 2019-05-30 LAB — BASIC METABOLIC PANEL WITH GFR
Anion gap: 9 (ref 5–15)
BUN: 9 mg/dL (ref 6–20)
CO2: 23 mmol/L (ref 22–32)
Calcium: 7.7 mg/dL — ABNORMAL LOW (ref 8.9–10.3)
Chloride: 101 mmol/L (ref 98–111)
Creatinine, Ser: 0.83 mg/dL (ref 0.61–1.24)
GFR calc Af Amer: >60 mL/min
GFR calc non Af Amer: >60 mL/min
Glucose, Bld: 104 mg/dL — ABNORMAL HIGH (ref 70–99)
Potassium: 4.1 mmol/L (ref 3.5–5.1)
Sodium: 133 mmol/L — ABNORMAL LOW (ref 135–145)

## 2019-05-30 LAB — CBC WITH DIFFERENTIAL/PLATELET
Abs Immature Granulocytes: 0.12 10*3/uL — ABNORMAL HIGH (ref 0.00–0.07)
Basophils Absolute: 0 10*3/uL (ref 0.0–0.1)
Basophils Relative: 1 %
Eosinophils Absolute: 0.1 10*3/uL (ref 0.0–0.5)
Eosinophils Relative: 2 %
HCT: 25.3 % — ABNORMAL LOW (ref 39.0–52.0)
Hemoglobin: 7.7 g/dL — ABNORMAL LOW (ref 13.0–17.0)
Immature Granulocytes: 2 %
Lymphocytes Relative: 27 %
Lymphs Abs: 1.6 10*3/uL (ref 0.7–4.0)
MCH: 23.5 pg — ABNORMAL LOW (ref 26.0–34.0)
MCHC: 30.4 g/dL (ref 30.0–36.0)
MCV: 77.1 fL — ABNORMAL LOW (ref 80.0–100.0)
Monocytes Absolute: 0.5 10*3/uL (ref 0.1–1.0)
Monocytes Relative: 8 %
Neutro Abs: 3.6 10*3/uL (ref 1.7–7.7)
Neutrophils Relative %: 60 %
Platelets: 315 10*3/uL (ref 150–400)
RBC: 3.28 MIL/uL — ABNORMAL LOW (ref 4.22–5.81)
RDW: 15.6 % — ABNORMAL HIGH (ref 11.5–15.5)
WBC: 6 10*3/uL (ref 4.0–10.5)
nRBC: 1 % — ABNORMAL HIGH (ref 0.0–0.2)

## 2019-05-30 MED ORDER — BOOST / RESOURCE BREEZE PO LIQD CUSTOM
1.0000 | Freq: Three times a day (TID) | ORAL | Status: DC
  Administered 2019-05-30 – 2019-06-01 (×4): 1 via ORAL

## 2019-05-30 NOTE — Progress Notes (Signed)
PROGRESS NOTE  BRAVEN WOLK HBZ:[id number] DOB: 03-08-1966 DOA: 05/12/2019 PCP: Patient, No Pcp Per   LOS: 18 days   Brief narrative: Patient is a 54 year old African-American male, morbidly obese, with past medical history significant for hypertension, DM, gout; and recent admission for pancreatitis and DKA.  Patient presented to the hospital with complaints of minimal oral intake since his discharge.  Patient reported shortness of breath and decreased ability to eat and drink.  He has not been fully compliant with insulin regimen as well.  Patient was then admitted to the hospital again for DKA and acute kidney injury, prerenal.  DKA and acute kidney injury have resolved.  Patient is currently being managed for complications of severe pancreatitis.  Assessment/Plan:  Principal Problem:   DKA (diabetic ketoacidosis) (HCC) Active Problems:   Acute pancreatitis   Acute kidney injury (HCC)   Morbid obesity (HCC)   Debility   Protein-calorie malnutrition, severe  Severe pancreatitis with peripancreatic fluid collection With concern for necrotizing pancreatitis and questionable superimposed infection Repeat CT abdomen pelvis 05/25/2019 showed persistent pancreatitis with new scattered peripancreatic gas,extensive ill-defined peripancreatic fluid extending throughout transverse colon into the root of mesentery. S/p cholecystectomy -Continue MS Contin dose to 30 mg twice daily for pain control, continue as needed IV hydromorphone -GI on board.  Appreciate recommendations.  Started on Cipro/Flagyl on 05/25/2019.  Underwent endoscopic drainage on 05/28/2019.  Culture from drainage pending.  NJ tube removed for the procedure. Per Dr. Meridee Score, plan for clear liquid diet for a day to see if he tolerates.  If he continues to have poor oral intake, recommended replacing NG tube to provide nutrition given degree of malnutrition until next procedure.  Discussed with patient importance of placing cortrack  for nutrition but he again declined today.  Tentative plan for repeat procedure on Monday, 06/01/2019 -Drainage from pancreatic pseudocyst is growing moderate staph aureus, pending susceptibilities.  Add vancomycin 2/12 -New onset diarrhea, stool C. difficile negative.  Likely related to tube feeds or secondary to worsening pancreatitis.  Albumin low ?  Negative acute phase reactant.  Creon and Lomotil added by GI.  Recurrent DKA Diabetes mellitus type 2 -Resolved.   -Hemoglobin A1c was 13.2 on 04/22/2019  -Currently on basal, correctional insulin  -Significant dose reduction in insulin required as he is off tube feeds and has poor p.o. intake  Sinus tachycardia Much improved since cystostomy  Likely secondary to systemic inflammatory response syndrome and pain - pain control -Management of pancreatitis as above  History of hypertension: -Controlled.   -Continue to monitor closely.   Hypokalemia Replenished.  Hypomagnesemia: -Continue to monitor closely.     Hypophosphatemia Replace as needed  Hypochromic anemia No obvious bleeding Lab work suggestive of anemia of chronic disease in setting of inflammation  Acute kidney injury on presentation: -Resolved.     Morbid obesity -BMI 43.4 -Further management on outpatient visit.  Debility and deconditioning. PT recommend CIR  Severe protein calorie malnutrition: -Caloric count  -Dietary team input is appreciated.   -Discussed importance of placing cortrack to continue nourishment but he declined  Right shoulder pain ?  Reported weakness.  On exam it appears his movement is limited due to pain.  It appears to be musculoskeletal due to prolonged hospitalization. Recommended topical pain control and movement  VTE Prophylaxis: Hold Lovenox until Monday for GI procedure to decrease risk of bleed  Code Status:  Full code  Family Communication:   Disposition Plan:  Patient is from home. Patient has been  seen by physical  therapy who recommended CIR.  Likely disposition to CIR when medically stable. Barriers to discharge include improvement of severe pancreatitis, need for procedure, needing IV Dilaudid for abdominal pain. He is at high risk of decompensation and overall prognosis is guarded.  Consultants:  GI  Interventional radiology  Procedures:  Cortrak feeding tube placement 05/14/2019  Antibiotics:   Anti-infectives (From admission, onward)   Start     Dose/Rate Route Frequency Ordered Stop   05/30/19 1700  vancomycin (VANCOREADY) IVPB 1750 mg/350 mL  Status:  Discontinued     1,750 mg 175 mL/hr over 120 Minutes Intravenous Every 12 hours 05/29/19 1533 05/29/19 2206   05/30/19 1100  vancomycin (VANCOREADY) IVPB 1750 mg/350 mL     1,750 mg 175 mL/hr over 120 Minutes Intravenous Every 12 hours 05/29/19 2206     05/29/19 1545  vancomycin (VANCOCIN) 2,500 mg in sodium chloride 0.9 % 500 mL IVPB     2,500 mg 250 mL/hr over 120 Minutes Intravenous  Once 05/29/19 1533 05/30/19 0014   05/25/19 1500  ciprofloxacin (CIPRO) IVPB 400 mg     400 mg 200 mL/hr over 60 Minutes Intravenous Every 12 hours 05/25/19 1408     05/25/19 1500  metroNIDAZOLE (FLAGYL) IVPB 500 mg     500 mg 100 mL/hr over 60 Minutes Intravenous Every 8 hours 05/25/19 1408       Subjective: Abdominal pain improved.  Complains of nausea.  Had some broth and coffee this morning but oral intake is limited due to nausea.  Discussed placing back the cortrack for nutrition but he declines.  Objective: Vitals:   05/29/19 2321 05/30/19 0358  BP: 128/74 134/74  Pulse: 98 99  Resp: (!) 22 (!) 24  Temp: 97.7 F (36.5 C) 97.7 F (36.5 C)  SpO2: 98% 98%    Intake/Output Summary (Last 24 hours) at 05/30/2019 0802 Last data filed at 05/30/2019 0400 Gross per 24 hour  Intake 740.92 ml  Output 625 ml  Net 115.92 ml   Filed Weights   05/28/19 1205 05/29/19 0619 05/30/19 0500  Weight: (!) 138.7 kg (!) 138.6 kg (!) 138.7 kg   Body  mass index is 41.47 kg/m.   Physical Exam:  General: Morbidly obese.  Awake and alert.  Tired appearing.  Lying down HEENT: Pallor.  No jaundice.  Dry buccal mucosa. Chest:  Clear to auscultation.    CVS: S1 &S2  Abdomen: Morbidly obese, soft and non-tender.  Bowel sounds present CNS: Patient is awake and alert.  Patient moves all extremities.   Extremities: Fullness of the ankle without any overt edema.  Right shoulder mild tenderness present  Data Review: I have personally reviewed the following laboratory data and studies,  CBC: Recent Labs  Lab 05/26/19 0509 05/27/19 0224 05/28/19 0306 05/29/19 0124 05/30/19 0234  WBC 6.2 7.4 9.5 7.2 6.0  NEUTROABS 4.0 4.7 7.9* 4.4 3.6  HGB 8.4* 7.6* 7.9* 7.6* 7.7*  HCT 26.6* 24.9* 25.6* 24.6* 25.3*  MCV 75.4* 77.6* 76.4* 76.2* 77.1*  PLT 278 329 341 302 607   Basic Metabolic Panel: Recent Labs  Lab 05/24/19 0248 05/24/19 0248 05/26/19 0509 05/27/19 0224 05/28/19 0306 05/29/19 0124 05/30/19 0234  NA 137   < > 134* 135 134* 136 133*  K 4.3   < > 4.0 3.8 4.2 3.9 4.1  CL 102   < > 100 101 101 104 101  CO2 24   < > 24 25 25 24 23   GLUCOSE 207*   < >  125* 219* 220* 77 104*  BUN 15   < > 11 10 7 8 9   CREATININE 0.87   < > 0.90 0.84 0.84 0.78 0.83  CALCIUM 8.2*   < > 7.8* 7.9* 8.0* 7.8* 7.7*  MG 1.7  --   --  1.6*  --   --   --   PHOS 3.4  --   --   --   --   --   --    < > = values in this interval not displayed.   Liver Function Tests: Recent Labs  Lab 05/24/19 0248 05/26/19 0509 05/27/19 0224  AST 24 18 15   ALT 29 22 17   ALKPHOS 123 92 86  BILITOT 0.3 0.3 0.6  PROT 6.7 7.0 6.9  ALBUMIN 1.2* 1.1* 1.0*   No results for input(s): LIPASE, AMYLASE in the last 168 hours. No results for input(s): AMMONIA in the last 168 hours. Cardiac Enzymes: No results for input(s): CKTOTAL, CKMB, CKMBINDEX, TROPONINI in the last 168 hours. BNP (last 3 results) Recent Labs    04/23/19 0450  BNP 99.2    ProBNP (last 3 results) No  results for input(s): PROBNP in the last 8760 hours.  CBG: Recent Labs  Lab 05/28/19 2145 05/29/19 0617 05/29/19 1109 05/29/19 1612 05/30/19 0704  GLUCAP 83 89 96 131* 113*   Recent Results (from the past 240 hour(s))  C difficile quick scan w PCR reflex     Status: None   Collection Time: 05/25/19  8:15 AM   Specimen: STOOL  Result Value Ref Range Status   C Diff antigen NEGATIVE NEGATIVE Final   C Diff toxin NEGATIVE NEGATIVE Final   C Diff interpretation No C. difficile detected.  Final    Comment: Performed at [facility], [address] [place], [facility] [address]  Aerobic/Anaerobic Culture (surgical/deep wound)     Status: None (Preliminary result)   Collection Time: 05/28/19  1:58 PM   Specimen: PATH GI biopsy; Tissue  Result Value Ref Range Status   Specimen Description FLUID PANCREATIC PSUEDOCYST ASPIRATE  Final   Special Requests NONE  Final   Gram Stain   Final    ABUNDANT WBC PRESENT,BOTH PMN AND MONONUCLEAR ABUNDANT GRAM POSITIVE COCCI    Culture   Final    MODERATE STAPHYLOCOCCUS AUREUS SUSCEPTIBILITIES TO FOLLOW Performed at [facility], [address], [place], [facility] [address]    Report Status PENDING  Incomplete     Studies: No results found.  Scheduled Meds: . allopurinol  300 mg Oral Daily  . chlorhexidine  15 mL Mouth Rinse BID  . diphenoxylate-atropine  2 tablet Oral BID  . feeding supplement (PRO-STAT SUGAR FREE 64)  30 mL Per Tube TID  . insulin aspart  0-20 Units Subcutaneous TID WC  . insulin aspart  0-5 Units Subcutaneous QHS  . insulin glargine  5 Units Subcutaneous Daily  . lipase/protease/amylase  36,000 Units Oral TID AC  . mouth rinse  15 mL Mouth Rinse q12n4p  . morphine  30 mg Oral Q12H    Continuous Infusions: . ciprofloxacin 400 mg (05/30/19 0254)  . feeding supplement (VITAL 1.5 CAL) Stopped (05/28/19 0030)  . metronidazole 500 mg (05/30/19 0050)  . vancomycin     Spent more than 30 minutes in  coordinating care for this patient including bedside patient care.  06/01/19, MD  [facility] 05/30/2019

## 2019-05-30 NOTE — H&P (View-Only) (Signed)
[facility] Progress Note  Chief Complaint: Necrotizing pancreatitis  History:  Caleb Chambers says he feels fairly well today, abdominal pain about the same as before, manageable with current treatment regimen.  Has been tolerating clear liquids fairly well.  Has some loose stool, most likely effect of antibiotics and the artificial sweetener and his protein supplement.  ROS: Denies chest pain or dyspnea  Objective:   Current Facility-Administered Medications:  .  acetaminophen (TYLENOL) tablet 650 mg, 650 mg, Oral, Q6H PRN, Yates, Jennifer, MD, 650 mg at 05/27/19 1207 .  allopurinol (ZYLOPRIM) tablet 300 mg, 300 mg, Oral, Daily, Yates, Jennifer, MD, 300 mg at 05/30/19 0909 .  chlorhexidine (PERIDEX) 0.12 % solution 15 mL, 15 mL, Mouth Rinse, BID, Pokhrel, Laxman, MD, 15 mL at 05/30/19 0909 .  ciprofloxacin (CIPRO) IVPB 400 mg, 400 mg, Intravenous, Q12H, Hung, Patrick, MD, Last Rate: 200 mL/hr at 05/30/19 0254, 400 mg at 05/30/19 0254 .  dextrose 50 % solution 0-50 mL, 0-50 mL, Intravenous, PRN, Yates, Jennifer, MD .  diphenhydrAMINE (BENADRYL) injection 12.5 mg, 12.5 mg, Intravenous, Q6H PRN **OR** diphenhydrAMINE (BENADRYL) 12.5 MG/5ML elixir 12.5 mg, 12.5 mg, Oral, Q6H PRN, Denny, Melanie, FNP .  diphenoxylate-atropine (LOMOTIL) 2.5-0.025 MG per tablet 2 tablet, 2 tablet, Oral, BID, Hung, Patrick, MD, 2 tablet at 05/30/19 0908 .  feeding supplement (BOOST / RESOURCE BREEZE) liquid 1 Container, 1 Container, Oral, TID BM, Danis, Irasema Chalk L III, MD .  feeding supplement (PRO-STAT SUGAR FREE 64) liquid 30 mL, 30 mL, Per Tube, TID, Pokhrel, Laxman, MD, 30 mL at 05/30/19 0909 .  feeding supplement (VITAL 1.5 CAL) liquid 1,000 mL, 1,000 mL, Per Tube, Continuous, Pokhrel, Laxman, MD, Stopped at 05/28/19 0030 .  HYDROmorphone (DILAUDID) injection 0.5 mg, 0.5 mg, Intravenous, Q3H PRN, Bodenheimer, Charles A, NP, 0.5 mg at 05/26/19 2028 .  insulin aspart (novoLOG) injection 0-20 Units, 0-20 Units,  Subcutaneous, TID WC, Patel, Janki Y, MD, 3 Units at 05/30/19 1155 .  insulin aspart (novoLOG) injection 0-5 Units, 0-5 Units, Subcutaneous, QHS, Patel, Janki Y, MD .  insulin glargine (LANTUS) injection 5 Units, 5 Units, Subcutaneous, Daily, Patel, Janki Y, MD, 5 Units at 05/30/19 0910 .  lidocaine (XYLOCAINE) 2 % viscous mouth solution 15 mL, 15 mL, Mouth/Throat, Q4H PRN, Pokhrel, Laxman, MD .  lipase/protease/amylase (CREON) capsule 36,000 Units, 36,000 Units, Oral, TID AC, Hung, Patrick, MD, 36,000 Units at 05/30/19 0908 .  MEDLINE mouth rinse, 15 mL, Mouth Rinse, q12n4p, Pokhrel, Laxman, MD, 15 mL at 05/30/19 1155 .  metroNIDAZOLE (FLAGYL) IVPB 500 mg, 500 mg, Intravenous, Q8H, Hung, Patrick, MD, Last Rate: 100 mL/hr at 05/30/19 0824, 500 mg at 05/30/19 0824 .  morphine (MS CONTIN) 12 hr tablet 30 mg, 30 mg, Oral, Q12H, Patel, Janki Y, MD, 30 mg at 05/30/19 0908 .  Muscle Rub CREA, , Topical, PRN, Patel, Janki Y, MD .  naloxone (NARCAN) injection 0.4 mg, 0.4 mg, Intravenous, PRN **AND** sodium chloride flush (NS) 0.9 % injection 9 mL, 9 mL, Intravenous, PRN, Denny, Melanie, FNP, 9 mL at 05/27/19 2200 .  ondansetron (ZOFRAN) injection 4 mg, 4 mg, Intravenous, Q6H PRN, Patel, Janki Y, MD, 4 mg at 05/30/19 1037 .  phenol (CHLORASEPTIC) mouth spray 1 spray, 1 spray, Mouth/Throat, PRN, Pokhrel, Laxman, MD, 1 spray at 05/17/19 1024 .  vancomycin (VANCOREADY) IVPB 1750 mg/350 mL, 1,750 mg, Intravenous, Q12H, Dang, Thuy D, RPH, Last Rate: 175 mL/hr at 05/30/19 1038, 1,750 mg at 05/30/19 1038  . ciprofloxacin 400 mg (05/30/19 0254)  .   feeding supplement (VITAL 1.5 CAL) Stopped (05/28/19 0030)  . metronidazole 500 mg (05/30/19 0824)  . vancomycin 1,750 mg (05/30/19 1038)     Vital signs in last 24 hrs: Vitals:   05/30/19 0805 05/30/19 1111  BP: 118/80 139/81  Pulse: 98 100  Resp: 20 20  Temp: 98 F (36.7 C) 97.7 F (36.5 C)  SpO2:  100%    Intake/Output Summary (Last 24 hours) at 05/30/2019  1333 Last data filed at 05/30/2019 0800 Gross per 24 hour  Intake 840.92 ml  Output 1025 ml  Net -184.08 ml     Physical Exam Chronically ill-appearing, but alert, conversational, comfortable  Cardiac: RRR without murmur, but heart sounds distant  Pulm: clear to auscultation bilaterally, normal RR and fair effort noted  Abdomen: soft, upper tenderness, with active bowel sounds. No guarding or palpable hepatosplenomegaly  Recent Labs:  CBC Latest Ref Rng & Units 05/30/2019 05/29/2019 05/28/2019  WBC 4.0 - 10.5 K/uL 6.0 7.2 9.5  Hemoglobin 13.0 - 17.0 g/dL 7.7(L) 7.6(L) 7.9(L)  Hematocrit 39.0 - 52.0 % 25.3(L) 24.6(L) 25.6(L)  Platelets 150 - 400 K/uL 315 302 341    Recent Labs  Lab 05/28/19 0306  INR 1.4*   CMP Latest Ref Rng & Units 05/30/2019 05/29/2019 05/28/2019  Glucose 70 - 99 mg/dL 185(U) 77 314(H)  BUN 6 - 20 mg/dL 9 8 7   Creatinine 0.61 - 1.24 mg/dL 7.02 6.37  Sodium 135 - 145 mmol/L 133(L) 136 134(L)  Potassium 3.5 - 5.1 mmol/L 4.1 3.9 4.2  Chloride 98 - 111 mmol/L 101 104 101  CO2 22 - 32 mmol/L 23 24 25   Calcium 8.9 - 10.3 mg/dL 7.7(L) 7.8(L) 8.0(L)  Total Protein 6.5 - 8.1 g/dL - - -  Total Bilirubin 0.3 - 1.2 mg/dL - - -  Alkaline Phos 38 - 126 U/L - - -  AST 15 - 41 U/L - - -  ALT 0 - 44 U/L - - -    @ASSESSMENTPLANBEGIN @ Assessment: Necrotizing pancreatitis Upper abdominal pain Severe protein calorie malnutrition   Plan: I added some boost breeze supplements.  Core track tube is not being replaced because he is having a repeat endoscopic necrosectomy the day after tomorrow.  Continue antibiotics.  No other recommendations or changes for me at this time.  I will see him tomorrow if called for clinical changes or questions.  Otherwise, he will have his procedure as scheduled on Monday.  III Office: [phone]

## 2019-05-30 NOTE — Progress Notes (Signed)
Pt is stable, vitals stable, appetite is poor, wanted to advance his diet, MD aware, denies SOB and chest pain, IVABX continue, will continue to monitor the patient  Lonia Farber, RN

## 2019-05-30 NOTE — Progress Notes (Signed)
Caleb Chambers  Chief Complaint: Necrotizing pancreatitis  History:  Caleb Chambers says he feels fairly well today, abdominal pain about the same as before, manageable with current treatment regimen.  Has been tolerating clear liquids fairly well.  Has some loose stool, most likely effect of antibiotics and the artificial sweetener and his protein supplement.  ROS: Denies chest pain or dyspnea  Objective:   Current Facility-Administered Medications:  .  acetaminophen (TYLENOL) tablet 650 mg, 650 mg, Oral, Q6H PRN, Karmen Bongo, MD, 650 mg at 05/27/19 1207 .  allopurinol (ZYLOPRIM) tablet 300 mg, 300 mg, Oral, Daily, Karmen Bongo, MD, 300 mg at 05/30/19 0909 .  chlorhexidine (PERIDEX) 0.12 % solution 15 mL, 15 mL, Mouth Rinse, BID, Pokhrel, Laxman, MD, 15 mL at 05/30/19 0909 .  ciprofloxacin (CIPRO) IVPB 400 mg, 400 mg, Intravenous, Q12H, Carol Ada, MD, Last Rate: 200 mL/hr at 05/30/19 0254, 400 mg at 05/30/19 0254 .  dextrose 50 % solution 0-50 mL, 0-50 mL, Intravenous, PRN, Karmen Bongo, MD .  diphenhydrAMINE (BENADRYL) injection 12.5 mg, 12.5 mg, Intravenous, Q6H PRN **OR** diphenhydrAMINE (BENADRYL) 12.5 MG/5ML elixir 12.5 mg, 12.5 mg, Oral, Q6H PRN, Lang Snow, FNP .  diphenoxylate-atropine (LOMOTIL) 2.5-0.025 MG per tablet 2 tablet, 2 tablet, Oral, BID, Carol Ada, MD, 2 tablet at 05/30/19 0908 .  feeding supplement (BOOST / RESOURCE BREEZE) liquid 1 Container, 1 Container, Oral, TID BM, Danis, Irini Leet L III, MD .  feeding supplement (PRO-STAT SUGAR FREE 64) liquid 30 mL, 30 mL, Per Tube, TID, Pokhrel, Laxman, MD, 30 mL at 05/30/19 0909 .  feeding supplement (VITAL 1.5 CAL) liquid 1,000 mL, 1,000 mL, Per Tube, Continuous, Pokhrel, Laxman, MD, Stopped at 05/28/19 0030 .  HYDROmorphone (DILAUDID) injection 0.5 mg, 0.5 mg, Intravenous, Q3H PRN, Bodenheimer, Charles A, NP, 0.5 mg at 05/26/19 2028 .  insulin aspart (novoLOG) injection 0-20 Units, 0-20 Units,  Subcutaneous, TID WC, Lucky Cowboy, MD, 3 Units at 05/30/19 1155 .  insulin aspart (novoLOG) injection 0-5 Units, 0-5 Units, Subcutaneous, QHS, Patel, Michaele Offer, MD .  insulin glargine (LANTUS) injection 5 Units, 5 Units, Subcutaneous, Daily, Lucky Cowboy, MD, 5 Units at 05/30/19 0910 .  lidocaine (XYLOCAINE) 2 % viscous mouth solution 15 mL, 15 mL, Mouth/Throat, Q4H PRN, Pokhrel, Laxman, MD .  lipase/protease/amylase (CREON) capsule 36,000 Units, 36,000 Units, Oral, TID Nolberto Hanlon, MD, 36,000 Units at 05/30/19 0908 .  MEDLINE mouth rinse, 15 mL, Mouth Rinse, q12n4p, Pokhrel, Laxman, MD, 15 mL at 05/30/19 1155 .  metroNIDAZOLE (FLAGYL) IVPB 500 mg, 500 mg, Intravenous, Q8H, Carol Ada, MD, Last Rate: 100 mL/hr at 05/30/19 0824, 500 mg at 05/30/19 0824 .  morphine (MS CONTIN) 12 hr tablet 30 mg, 30 mg, Oral, Q12H, Lucky Cowboy, MD, 30 mg at 05/30/19 0908 .  Muscle Rub CREA, , Topical, PRN, Lucky Cowboy, MD .  naloxone [facility]) injection 0.4 mg, 0.4 mg, Intravenous, PRN **AND** sodium chloride flush (NS) 0.9 % injection 9 mL, 9 mL, Intravenous, PRN, Lang Snow, FNP, 9 mL at 05/27/19 2200 .  ondansetron (ZOFRAN) injection 4 mg, 4 mg, Intravenous, Q6H PRN, Lucky Cowboy, MD, 4 mg at 05/30/19 1037 .  phenol (CHLORASEPTIC) mouth spray 1 spray, 1 spray, Mouth/Throat, PRN, Pokhrel, Laxman, MD, 1 spray at 05/17/19 1024 .  vancomycin (VANCOREADY) IVPB 1750 mg/350 mL, 1,750 mg, Intravenous, Q12H, Tyrone Apple, RPH, Last Rate: 175 mL/hr at 05/30/19 1038, 1,750 mg at 05/30/19 1038  . ciprofloxacin 400 mg (05/30/19 0254)  .  feeding supplement (VITAL 1.5 CAL) Stopped (05/28/19 0030)  . metronidazole 500 mg (05/30/19 0824)  . vancomycin 1,750 mg (05/30/19 1038)     Vital signs in last 24 hrs: Vitals:   05/30/19 0805 05/30/19 1111  BP: 118/80 139/81  Pulse: 98 100  Resp: 20 20  Temp: 98 F (36.7 C) 97.7 F (36.5 C)  SpO2:  100%    Intake/Output Summary (Last 24 hours) at 05/30/2019  1333 Last data filed at 05/30/2019 0800 Gross per 24 hour  Intake 840.92 ml  Output 1025 ml  Net -184.08 ml     Physical Exam Chronically ill-appearing, but alert, conversational, comfortable  Cardiac: RRR without murmur, but heart sounds distant  Pulm: clear to auscultation bilaterally, normal RR and fair effort noted  Abdomen: soft, upper tenderness, with active bowel sounds. No guarding or palpable hepatosplenomegaly  Recent Labs:  CBC Latest Ref Rng & Units 05/30/2019 05/29/2019 05/28/2019  WBC 4.0 - 10.5 K/uL 6.0 7.2 9.5  Hemoglobin 13.0 - 17.0 g/dL 7.7(L) 7.6(L) 7.9(L)  Hematocrit 39.0 - 52.0 % 25.3(L) 24.6(L) 25.6(L)  Platelets 150 - 400 K/uL 315 302 341    Recent Labs  Lab 05/28/19 0306  INR 1.4*   CMP Latest Ref Rng & Units 05/30/2019 05/29/2019 05/28/2019  Glucose 70 - 99 mg/dL 185(U) 77 314(H)  BUN 6 - 20 mg/dL 9 8 7   Creatinine 0.61 - 1.24 mg/dL 7.02 6.37  Sodium 135 - 145 mmol/L 133(L) 136 134(L)  Potassium 3.5 - 5.1 mmol/L 4.1 3.9 4.2  Chloride 98 - 111 mmol/L 101 104 101  CO2 22 - 32 mmol/L 23 24 25   Calcium 8.9 - 10.3 mg/dL 7.7(L) 7.8(L) 8.0(L)  Total Protein 6.5 - 8.1 g/dL - - -  Total Bilirubin 0.3 - 1.2 mg/dL - - -  Alkaline Phos 38 - 126 U/L - - -  AST 15 - 41 U/L - - -  ALT 0 - 44 U/L - - -    @ASSESSMENTPLANBEGIN @ Assessment: Necrotizing pancreatitis Upper abdominal pain Severe protein calorie malnutrition   Plan: I added some boost breeze supplements.  Core track tube is not being replaced because he is having a repeat endoscopic necrosectomy the day after tomorrow.  Continue antibiotics.  No other recommendations or changes for me at this time.  I will see him tomorrow if called for clinical changes or questions.  Otherwise, he will have his procedure as scheduled on Monday.  III Office: [phone]

## 2019-05-31 LAB — GLUCOSE, CAPILLARY
Glucose-Capillary: 136 mg/dL — ABNORMAL HIGH (ref 70–99)
Glucose-Capillary: 152 mg/dL — ABNORMAL HIGH (ref 70–99)
Glucose-Capillary: 142 mg/dL — ABNORMAL HIGH (ref 70–99)
Glucose-Capillary: 132 mg/dL — ABNORMAL HIGH (ref 70–99)

## 2019-05-31 LAB — MAGNESIUM: Magnesium: 1.5 mg/dL — ABNORMAL LOW (ref 1.7–2.4)

## 2019-05-31 MED ORDER — INSULIN GLARGINE 100 UNIT/ML ~~LOC~~ SOLN
7.0000 [IU] | Freq: Every day | SUBCUTANEOUS | Status: DC
  Administered 2019-06-02: 11:00:00 7 [IU] via SUBCUTANEOUS
  Filled 2019-05-31 (×3): qty 0.07

## 2019-05-31 MED ORDER — MAGNESIUM SULFATE 2 GM/50ML IV SOLN
2.0000 g | Freq: Once | INTRAVENOUS | Status: AC
  Administered 2019-05-31: 16:00:00 2 g via INTRAVENOUS
  Filled 2019-05-31: qty 50

## 2019-05-31 NOTE — Progress Notes (Signed)
PROGRESS NOTE  Caleb Chambers QIO:[id number] DOB: 1965-08-08 DOA: 05/12/2019 PCP: Patient, No Pcp Per   LOS: 19 days   Brief narrative: Patient is a 54 year old African-American male, morbidly obese, with past medical history significant for hypertension, DM, gout; and recent admission for pancreatitis and DKA.  Patient presented to the hospital with complaints of minimal oral intake since his discharge.  Patient reported shortness of breath and decreased ability to eat and drink.  He has not been fully compliant with insulin regimen as well.  Patient was then admitted to the hospital again for DKA and acute kidney injury, prerenal.  DKA and acute kidney injury have resolved.  Patient is currently being managed for complications of severe pancreatitis.  Assessment/Plan:  Principal Problem:   DKA (diabetic ketoacidosis) ([place]) Active Problems:   Acute pancreatitis   Acute kidney injury ([place])   Morbid obesity ([place])   Debility   Protein-calorie malnutrition, severe  Severe pancreatitis with peripancreatic fluid collection With concern for necrotizing pancreatitis and questionable superimposed infection Repeat CT abdomen pelvis 05/25/2019 showed persistent pancreatitis with new scattered peripancreatic gas,extensive ill-defined peripancreatic fluid extending throughout transverse colon into the root of mesentery. S/p cholecystectomy -Continue MS Contin dose to 30 mg twice daily for pain control, continue as needed IV hydromorphone -GI on board.  Appreciate recommendations.  Started on Cipro/Flagyl on 05/25/2019.  Will need to clarify with GI if Cipro and Flagyl need to be continued. - Underwent upper EUS with AXIOS cystogastrostomy on 05/28/2019 by Dr. Rush Landmark.   - Per discussion with Dr. Rush Landmark, was noted to have esophagitis/duodenitis.  IV PPI was discontinued to allow gastric acid help with cystostomy drainage.  NJ tube removed for the procedure. Plan for clear liquid diet for a day to  see if he tolerates.  If he continues to have poor oral intake, recommended replacing NG tube to provide nutrition given degree of malnutrition until next procedure but patient declined. Tentative plan for repeat procedure on Monday, 06/01/2019 -Drainage from pancreatic pseudocyst is growing moderate staph aureus, pending susceptibilities.  Added vancomycin 2/12 -New onset diarrhea, stool C. difficile negative.  Likely related to tube feeds or secondary to worsening pancreatitis.  Albumin low ?  Negative acute phase reactant.  Creon and Lomotil added by GI.  Recurrent DKA Diabetes mellitus type 2 -Resolved.   -Hemoglobin A1c was 13.2 on 04/22/2019  -Currently on basal, correctional insulin  -Significant dose reduction in insulin required as he is off tube feeds and has poor p.o. intake  Sinus tachycardia Much improved since cystostomy  Likely secondary to systemic inflammatory response syndrome and pain - pain control -Management of pancreatitis as above  History of hypertension: -Controlled.   -Continue to monitor closely.   Hypokalemia Replenished.  Hypomagnesemia: -Continue to monitor closely.     Hypophosphatemia Replace as needed  Hypochromic anemia No obvious bleeding Lab work suggestive of anemia of chronic disease in setting of inflammation  Acute kidney injury on presentation: -Resolved.     Morbid obesity -BMI 43.4 -Further management on outpatient visit.  Debility and deconditioning. PT recommend CIR  Severe protein calorie malnutrition: -Caloric count  -Dietary team input is appreciated.   -Discussed importance of placing cortrack to continue nourishment but he declined  Right shoulder pain ?  Reported weakness.  On exam it appears his movement is limited due to pain.  It appears to be musculoskeletal due to prolonged hospitalization. Recommended topical pain control and movement  VTE Prophylaxis: Hold Lovenox until Monday for GI procedure  to decrease  risk of bleed  Code Status:  Full code  Family Communication:   Disposition Plan:  Patient is from home. Patient has been seen by physical therapy who recommended CIR.  Likely disposition to CIR when medically stable. Barriers to discharge include improvement of severe pancreatitis, need for procedure, needing IV Dilaudid for abdominal pain. He is at high risk of decompensation and overall prognosis is guarded.  Consultants:  GI  Interventional radiology  Procedures:  Cortrak feeding tube placement 05/14/2019  Axios cystogastrostomy, upper EUS 05/28/2019  Antibiotics:   Anti-infectives (From admission, onward)   Start     Dose/Rate Route Frequency Ordered Stop   05/30/19 1700  vancomycin (VANCOREADY) IVPB 1750 mg/350 mL  Status:  Discontinued     1,750 mg 175 mL/hr over 120 Minutes Intravenous Every 12 hours 05/29/19 1533 05/29/19 2206   05/30/19 1100  vancomycin (VANCOREADY) IVPB 1750 mg/350 mL     1,750 mg 175 mL/hr over 120 Minutes Intravenous Every 12 hours 05/29/19 2206     05/29/19 1545  vancomycin (VANCOCIN) 2,500 mg in sodium chloride 0.9 % 500 mL IVPB     2,500 mg 250 mL/hr over 120 Minutes Intravenous  Once 05/29/19 1533 05/30/19 0014   05/25/19 1500  ciprofloxacin (CIPRO) IVPB 400 mg     400 mg 200 mL/hr over 60 Minutes Intravenous Every 12 hours 05/25/19 1408     05/25/19 1500  metroNIDAZOLE (FLAGYL) IVPB 500 mg     500 mg 100 mL/hr over 60 Minutes Intravenous Every 8 hours 05/25/19 1408       Subjective: Nausea and abdominal pain better.  Tolerated some p.o. intake.  Continues to have loose stools.  Objective: Vitals:   05/30/19 2252 05/31/19 0312  BP: 130/66 136/64  Pulse: 99 (!) 101  Resp: (!) 22 18  Temp: 98.4 F (36.9 C) (!) 97.5 F (36.4 C)  SpO2: 100% 98%    Intake/Output Summary (Last 24 hours) at 05/31/2019 0743 Last data filed at 05/31/2019 0400 Gross per 24 hour  Intake 803.92 ml  Output 1700 ml  Net -896.08 ml   Filed Weights    05/29/19 0619 05/30/19 0500 05/31/19 0312  Weight: (!) 138.6 kg (!) 138.7 kg (!) 138.8 kg   Body mass index is 41.5 kg/m.   Physical Exam:  General: Morbidly obese.  Awake and alert.  Tired appearing.  Lying down HEENT: Pallor.  No jaundice.  Dry buccal mucosa. Chest:  Clear to auscultation.    CVS: S1 &S2  Abdomen: Morbidly obese, soft and non-tender.  Bowel sounds present CNS: Patient is awake and alert.  Patient moves all extremities.   Extremities: Fullness of the ankle without any overt edema.  Right shoulder mild tenderness present  Data Review: I have personally reviewed the following laboratory data and studies,  CBC: Recent Labs  Lab 05/26/19 0509 05/27/19 0224 05/28/19 0306 05/29/19 0124 05/30/19 0234  WBC 6.2 7.4 9.5 7.2 6.0  NEUTROABS 4.0 4.7 7.9* 4.4 3.6  HGB 8.4* 7.6* 7.9* 7.6* 7.7*  HCT 26.6* 24.9* 25.6* 24.6* 25.3*  MCV 75.4* 77.6* 76.4* 76.2* 77.1*  PLT 278 329 341 302 315   Basic Metabolic Panel: Recent Labs  Lab 05/26/19 0509 05/27/19 0224 05/28/19 0306 05/29/19 0124 05/30/19 0234  NA 134* 135 134* 136 133*  K 4.0 3.8 4.2 3.9 4.1  CL 100 101 101 104 101  CO2 24 25 25 24 23   GLUCOSE 125* 219* 220* 77 104*  BUN 11 10 7  8 9  CREATININE 0.90 0.84 0.84 0.78 0.83  CALCIUM 7.8* 7.9* 8.0* 7.8* 7.7*  MG  --  1.6*  --   --   --    Liver Function Tests: Recent Labs  Lab 05/26/19 0509 05/27/19 0224  AST 18 15  ALT 22 17  ALKPHOS 92 86  BILITOT 0.3 0.6  PROT 7.0 6.9  ALBUMIN 1.1* 1.0*   No results for input(s): LIPASE, AMYLASE in the last 168 hours. No results for input(s): AMMONIA in the last 168 hours. Cardiac Enzymes: No results for input(s): CKTOTAL, CKMB, CKMBINDEX, TROPONINI in the last 168 hours. BNP (last 3 results) Recent Labs    04/23/19 0450  BNP 99.2    ProBNP (last 3 results) No results for input(s): PROBNP in the last 8760 hours.  CBG: Recent Labs  Lab 05/30/19 0704 05/30/19 1135 05/30/19 1615 05/30/19 2107  05/31/19 0618  GLUCAP 113* 123* 109* 111* 136*   Recent Results (from the past 240 hour(s))  C difficile quick scan w PCR reflex     Status: None   Collection Time: 05/25/19  8:15 AM   Specimen: STOOL  Result Value Ref Range Status   C Diff antigen NEGATIVE NEGATIVE Final   C Diff toxin NEGATIVE NEGATIVE Final   C Diff interpretation No C. difficile detected.  Final    Comment: Performed at [facility], [address] [place], [place] [address]  Aerobic/Anaerobic Culture (surgical/deep wound)     Status: None (Preliminary result)   Collection Time: 05/28/19  1:58 PM   Specimen: PATH GI biopsy; Tissue  Result Value Ref Range Status   Specimen Description FLUID PANCREATIC PSUEDOCYST ASPIRATE  Final   Special Requests NONE  Final   Gram Stain   Final    ABUNDANT WBC PRESENT,BOTH PMN AND MONONUCLEAR ABUNDANT GRAM POSITIVE COCCI    Culture   Final    MODERATE STAPHYLOCOCCUS AUREUS REPEAT SUSCEPTIBILITIES TO FOLLOW CULTURE REINCUBATED FOR BETTER GROWTH Performed at [facility], [address], [place], [place] [address]    Report Status PENDING  Incomplete     Studies: No results found.  Scheduled Meds: . allopurinol  300 mg Oral Daily  . chlorhexidine  15 mL Mouth Rinse BID  . diphenoxylate-atropine  2 tablet Oral BID  . feeding supplement  1 Container Oral TID BM  . feeding supplement (PRO-STAT SUGAR FREE 64)  30 mL Per Tube TID  . insulin aspart  0-20 Units Subcutaneous TID WC  . insulin aspart  0-5 Units Subcutaneous QHS  . insulin glargine  5 Units Subcutaneous Daily  . lipase/protease/amylase  36,000 Units Oral TID AC  . mouth rinse  15 mL Mouth Rinse q12n4p  . morphine  30 mg Oral Q12H    Continuous Infusions: . ciprofloxacin 400 mg (05/31/19 0309)  . feeding supplement (VITAL 1.5 CAL) Stopped (05/28/19 0030)  . metronidazole 500 mg (05/30/19 2223)  . vancomycin 1,750 mg (05/30/19 2351)   Spent more than 30 minutes in coordinating care for this  patient including bedside patient care.  Liborio Nixon, MD  [facility] 05/31/2019

## 2019-06-01 ENCOUNTER — Inpatient Hospital Stay (HOSPITAL_COMMUNITY): Payer: BC Managed Care – PPO | Admitting: Certified Registered"

## 2019-06-01 ENCOUNTER — Encounter (HOSPITAL_COMMUNITY)
Admission: EM | Disposition: A | Discharge status: Home Health | Payer: Self-pay | Source: Home / Self Care | Attending: Internal Medicine

## 2019-06-01 ENCOUNTER — Encounter (HOSPITAL_COMMUNITY): Payer: Self-pay | Admitting: Internal Medicine

## 2019-06-01 DIAGNOSIS — E46 Unspecified protein-calorie malnutrition: Secondary | ICD-10-CM

## 2019-06-01 DIAGNOSIS — E1310 Other specified diabetes mellitus with ketoacidosis without coma: Secondary | ICD-10-CM

## 2019-06-01 HISTORY — PX: STENT REMOVAL: SHX6421

## 2019-06-01 HISTORY — PX: PANCREATIC STENT PLACEMENT: SHX5539

## 2019-06-01 HISTORY — PX: ENDOROTOR: SHX6859

## 2019-06-01 HISTORY — PX: ESOPHAGOGASTRODUODENOSCOPY: SHX5428

## 2019-06-01 LAB — COMPREHENSIVE METABOLIC PANEL
ALT: 13 U/L (ref 0–44)
AST: 18 U/L (ref 15–41)
Albumin: 1.2 g/dL — ABNORMAL LOW (ref 3.5–5.0)
Alkaline Phosphatase: 66 U/L (ref 38–126)
Anion gap: 15 (ref 5–15)
BUN: 8 mg/dL (ref 6–20)
CO2: 16 mmol/L — ABNORMAL LOW (ref 22–32)
Calcium: 7.5 mg/dL — ABNORMAL LOW (ref 8.9–10.3)
Chloride: 103 mmol/L (ref 98–111)
Creatinine, Ser: 0.87 mg/dL (ref 0.61–1.24)
GFR calc Af Amer: >60 mL/min (ref >60)
GFR calc non Af Amer: >60 mL/min (ref >60)
Glucose, Bld: 207 mg/dL — ABNORMAL HIGH (ref 70–99)
Potassium: 4 mmol/L (ref 3.5–5.1)
Sodium: 134 mmol/L — ABNORMAL LOW (ref 135–145)
Total Bilirubin: 1.3 mg/dL — ABNORMAL HIGH (ref 0.3–1.2)
Total Protein: 6.9 g/dL (ref 6.5–8.1)

## 2019-06-01 LAB — GLUCOSE, CAPILLARY
Glucose-Capillary: 90 mg/dL (ref 70–99)
Glucose-Capillary: 184 mg/dL — ABNORMAL HIGH (ref 70–99)
Glucose-Capillary: 212 mg/dL — ABNORMAL HIGH (ref 70–99)
Glucose-Capillary: 207 mg/dL — ABNORMAL HIGH (ref 70–99)
Glucose-Capillary: 289 mg/dL — ABNORMAL HIGH (ref 70–99)
Glucose-Capillary: 226 mg/dL — ABNORMAL HIGH (ref 70–99)

## 2019-06-01 LAB — CBC WITH DIFFERENTIAL/PLATELET
Abs Immature Granulocytes: 0.14 10*3/uL — ABNORMAL HIGH (ref 0.00–0.07)
Basophils Absolute: 0 10*3/uL (ref 0.0–0.1)
Basophils Relative: 0 %
Eosinophils Absolute: 0 10*3/uL (ref 0.0–0.5)
Eosinophils Relative: 0 %
HCT: 25.8 % — ABNORMAL LOW (ref 39.0–52.0)
Hemoglobin: 7.8 g/dL — ABNORMAL LOW (ref 13.0–17.0)
Immature Granulocytes: 1 %
Lymphocytes Relative: 11 %
Lymphs Abs: 1.3 10*3/uL (ref 0.7–4.0)
MCH: 24.1 pg — ABNORMAL LOW (ref 26.0–34.0)
MCHC: 30.2 g/dL (ref 30.0–36.0)
MCV: 79.6 fL — ABNORMAL LOW (ref 80.0–100.0)
Monocytes Absolute: 0.5 10*3/uL (ref 0.1–1.0)
Monocytes Relative: 4 %
Neutro Abs: 9.7 10*3/uL — ABNORMAL HIGH (ref 1.7–7.7)
Neutrophils Relative %: 84 %
Platelets: 355 10*3/uL (ref 150–400)
RBC: 3.24 MIL/uL — ABNORMAL LOW (ref 4.22–5.81)
RDW: 15.9 % — ABNORMAL HIGH (ref 11.5–15.5)
WBC: 11.7 10*3/uL — ABNORMAL HIGH (ref 4.0–10.5)
nRBC: 1.9 % — ABNORMAL HIGH (ref 0.0–0.2)

## 2019-06-01 LAB — ABO/RH: ABO/RH(D): O POS

## 2019-06-01 LAB — PREPARE RBC (CROSSMATCH)

## 2019-06-01 SURGERY — EGD (ESOPHAGOGASTRODUODENOSCOPY)
Anesthesia: General

## 2019-06-01 MED ORDER — ALBUMIN HUMAN 5 % IV SOLN: INTRAVENOUS | Status: DC | PRN

## 2019-06-01 MED ORDER — PHENYLEPHRINE 40 MCG/ML (10ML) SYRINGE FOR IV PUSH (FOR BLOOD PRESSURE SUPPORT)
PREFILLED_SYRINGE | INTRAVENOUS | Status: DC | PRN
  Administered 2019-06-01 (×2): 120 ug via INTRAVENOUS

## 2019-06-01 MED ORDER — SODIUM CHLORIDE 0.9 % IV BOLUS
500.0000 mL | Freq: Once | INTRAVENOUS | Status: AC
  Administered 2019-06-01: 03:00:00 500 mL via INTRAVENOUS

## 2019-06-01 MED ORDER — SUGAMMADEX SODIUM 200 MG/2ML IV SOLN
INTRAVENOUS | Status: DC | PRN
  Administered 2019-06-01: 300 mg via INTRAVENOUS

## 2019-06-01 MED ORDER — SODIUM CHLORIDE 0.9% IV SOLUTION: Freq: Once | INTRAVENOUS | Status: DC

## 2019-06-01 MED ORDER — ONDANSETRON HCL 4 MG/2ML IJ SOLN
4.0000 mg | Freq: Once | INTRAMUSCULAR | Status: AC | PRN
  Administered 2019-06-01: 4 mg via INTRAVENOUS

## 2019-06-01 MED ORDER — ADULT MULTIVITAMIN W/MINERALS CH
1.0000 | ORAL_TABLET | Freq: Every day | ORAL | Status: DC
  Administered 2019-06-02 – 2019-06-22 (×15): 1 via ORAL
  Filled 2019-06-01 (×17): qty 1

## 2019-06-01 MED ORDER — VASOPRESSIN 20 UNIT/ML IV SOLN
INTRAVENOUS | Status: DC | PRN
  Administered 2019-06-01 (×5): 1 [IU] via INTRAVENOUS

## 2019-06-01 MED ORDER — ENSURE ENLIVE PO LIQD
237.0000 mL | Freq: Three times a day (TID) | ORAL | Status: DC
  Administered 2019-06-01 – 2019-06-16 (×10): 237 mL via ORAL

## 2019-06-01 MED ORDER — LACTATED RINGERS IV SOLN: INTRAVENOUS | Status: DC

## 2019-06-01 MED ORDER — CALCIUM CARBONATE ANTACID 500 MG PO CHEW
1.0000 | CHEWABLE_TABLET | Freq: Every day | ORAL | Status: DC
  Administered 2019-06-02 – 2019-06-09 (×5): 200 mg via ORAL
  Filled 2019-06-01 (×7): qty 1

## 2019-06-01 MED ORDER — PHENYLEPHRINE HCL-NACL 10-0.9 MG/250ML-% IV SOLN
INTRAVENOUS | Status: DC | PRN
  Administered 2019-06-01: 50 ug/min via INTRAVENOUS

## 2019-06-01 MED ORDER — PROPOFOL 10 MG/ML IV BOLUS
INTRAVENOUS | Status: DC | PRN
  Administered 2019-06-01: 150 mg via INTRAVENOUS

## 2019-06-01 MED ORDER — FENTANYL CITRATE (PF) 100 MCG/2ML IJ SOLN: 25.0000 ug | INTRAMUSCULAR | Status: DC | PRN

## 2019-06-01 MED ORDER — SODIUM CHLORIDE 0.9 % IV SOLN: INTRAVENOUS | Status: DC

## 2019-06-01 MED ORDER — ROCURONIUM BROMIDE 100 MG/10ML IV SOLN
INTRAVENOUS | Status: DC | PRN
  Administered 2019-06-01: 20 mg via INTRAVENOUS
  Administered 2019-06-01: 10 mg via INTRAVENOUS
  Administered 2019-06-01: 60 mg via INTRAVENOUS

## 2019-06-01 MED ORDER — VASOPRESSIN 20 UNIT/ML IV SOLN
0.0100 [IU]/min | INTRAVENOUS | Status: DC
  Filled 2019-06-01 (×2): qty 2

## 2019-06-01 MED ORDER — ESMOLOL HCL 100 MG/10ML IV SOLN
INTRAVENOUS | Status: DC | PRN
  Administered 2019-06-01: 25 mg via INTRAVENOUS

## 2019-06-01 MED ORDER — DEXAMETHASONE SODIUM PHOSPHATE 10 MG/ML IJ SOLN
INTRAMUSCULAR | Status: DC | PRN
  Administered 2019-06-01: 5 mg via INTRAVENOUS

## 2019-06-01 MED ORDER — LIDOCAINE HCL (CARDIAC) PF 100 MG/5ML IV SOSY
PREFILLED_SYRINGE | INTRAVENOUS | Status: DC | PRN
  Administered 2019-06-01: 60 mg via INTRATRACHEAL

## 2019-06-01 MED ORDER — FENTANYL CITRATE (PF) 100 MCG/2ML IJ SOLN
INTRAMUSCULAR | Status: DC | PRN
  Administered 2019-06-01: 100 ug via INTRAVENOUS

## 2019-06-01 MED ORDER — ENOXAPARIN SODIUM 40 MG/0.4ML ~~LOC~~ SOLN
40.0000 mg | SUBCUTANEOUS | Status: DC
  Administered 2019-06-02 – 2019-06-21 (×14): 40 mg via SUBCUTANEOUS
  Filled 2019-06-01 (×16): qty 0.4

## 2019-06-01 NOTE — Progress Notes (Signed)
Physical Therapy Cancellation Note   06/01/19 0835  PT Visit Information  Last PT Received On 06/01/19  Reason Eval/Treat Not Completed Patient at procedure or test/unavailable. Pt off unit for EGD. PT will continue to follow acutely.   Erline Levine, PTA Acute Rehabilitation Services Pager: [phone] Office: [phone]

## 2019-06-01 NOTE — Progress Notes (Signed)
Pt refusing VS per MEWS. Pt states " I don't want to be bothered with all that". RN will continue to monitor pt.

## 2019-06-01 NOTE — Anesthesia Preprocedure Evaluation (Addendum)
Anesthesia Evaluation  Patient identified by MRN, date of birth, ID band Patient awake    Reviewed: Allergy & Precautions, NPO status , Patient's Chart, lab work & pertinent test results  Airway Mallampati: II  TM Distance: >3 FB Neck ROM: Full    Dental  (+) Missing, Chipped,    Pulmonary neg pulmonary ROS,    Pulmonary exam normal breath sounds clear to auscultation       Cardiovascular hypertension, Pt. on medications  Rhythm:Regular Rate:Tachycardia  ECG: ST, rate 102   Neuro/Psych negative neurological ROS  negative psych ROS   GI/Hepatic negative GI ROS, Neg liver ROS,   Endo/Other  diabetes, Insulin Dependent, Oral Hypoglycemic AgentsMorbid obesitySevere pancreatitis with peripancreatic fluid collection SIRS  Renal/GU negative Renal ROS     Musculoskeletal Gout   Abdominal (+) + obese,   Peds  Hematology  (+) anemia ,   Anesthesia Other Findings Pancreatic Necrosis  Reproductive/Obstetrics                            Anesthesia Physical Anesthesia Plan  ASA: IV  Anesthesia Plan: General   Post-op Pain Management:    Induction: Intravenous  PONV Risk Score and Plan: 2 and Ondansetron, Dexamethasone, Midazolam and Treatment may vary due to age or medical condition  Airway Management Planned: Oral ETT  Additional Equipment:   Intra-op Plan:   Post-operative Plan: Extubation in OR  Informed Consent: I have reviewed the patients History and Physical, chart, labs and discussed the procedure including the risks, benefits and alternatives for the proposed anesthesia with the patient or authorized representative who has indicated his/her understanding and acceptance.     Dental advisory given  Plan Discussed with: CRNA  Anesthesia Plan Comments:        Anesthesia Quick Evaluation

## 2019-06-01 NOTE — Plan of Care (Signed)
  Problem: Education: Goal: Ability to describe self-care measures that may prevent or decrease complications (Diabetes Survival Skills Education) will improve Outcome: Progressing Goal: Individualized Educational Video(s) Outcome: Progressing   Problem: Cardiac: Goal: Ability to maintain an adequate cardiac output will improve Outcome: Progressing   Problem: Health Behavior/Discharge Planning: Goal: Ability to identify and utilize available resources and services will improve Outcome: Progressing Goal: Ability to manage health-related needs will improve Outcome: Progressing   Problem: Fluid Volume: Goal: Ability to achieve a balanced intake and output will improve Outcome: Progressing   Problem: Metabolic: Goal: Ability to maintain appropriate glucose levels will improve Outcome: Progressing   Problem: Nutritional: Goal: Maintenance of adequate nutrition will improve Outcome: Progressing Goal: Maintenance of adequate weight for body size and type will improve Outcome: Progressing   Problem: Respiratory: Goal: Will regain and/or maintain adequate ventilation Outcome: Progressing   Problem: Urinary Elimination: Goal: Ability to achieve and maintain adequate renal perfusion and functioning will improve Outcome: Progressing   Problem: Education: Goal: Knowledge of General Education information will improve Description: Including pain rating scale, medication(s)/side effects and non-pharmacologic comfort measures Outcome: Progressing   Problem: Health Behavior/Discharge Planning: Goal: Ability to manage health-related needs will improve Outcome: Progressing   Problem: Clinical Measurements: Goal: Ability to maintain clinical measurements within normal limits will improve Outcome: Progressing Goal: Will remain free from infection Outcome: Progressing Goal: Diagnostic test results will improve Outcome: Progressing Goal: Respiratory complications will improve Outcome:  Progressing Goal: Cardiovascular complication will be avoided Outcome: Progressing   Problem: Activity: Goal: Risk for activity intolerance will decrease Outcome: Progressing   Problem: Nutrition: Goal: Adequate nutrition will be maintained Outcome: Progressing   Problem: Coping: Goal: Level of anxiety will decrease Outcome: Progressing   Problem: Elimination: Goal: Will not experience complications related to bowel motility Outcome: Progressing Goal: Will not experience complications related to urinary retention Outcome: Progressing   Problem: Pain Managment: Goal: General experience of comfort will improve Outcome: Progressing   Problem: Safety: Goal: Ability to remain free from injury will improve Outcome: Progressing   Problem: Skin Integrity: Goal: Risk for impaired skin integrity will decrease Outcome: Progressing   

## 2019-06-01 NOTE — Anesthesia Procedure Notes (Signed)
Procedure Name: Intubation Date/Time: 06/01/2019 8:20 AM Performed by: Lanell Matar, CRNA Pre-anesthesia Checklist: Patient identified, Emergency Drugs available, Suction available and Patient being monitored Patient Re-evaluated:Patient Re-evaluated prior to induction Oxygen Delivery Method: Circle System Utilized Preoxygenation: Pre-oxygenation with 100% oxygen Induction Type: IV induction Ventilation: Mask ventilation without difficulty and Oral airway inserted - appropriate to patient size Laryngoscope Size: Hyacinth Meeker and 2 Grade View: Grade II Tube type: Oral Tube size: 7.5 mm Number of attempts: 1 Airway Equipment and Method: Stylet and Oral airway Placement Confirmation: ETT inserted through vocal cords under direct vision,  positive ETCO2 and breath sounds checked- equal and bilateral Secured at: 22 cm Tube secured with: Tape Dental Injury: Teeth and Oropharynx as per pre-operative assessment

## 2019-06-01 NOTE — Interval H&P Note (Signed)
History and Physical Interval Note:  06/01/2019 7:28 AM  Caleb Chambers  has presented today for surgery, with the diagnosis of Pancreatic Necrosis.  The various methods of treatment have been discussed with the patient and family. After consideration of risks, benefits and other options for treatment, the patient has consented to  Procedure(s): ESOPHAGOGASTRODUODENOSCOPY (EGD) with NECROSECTOMY (N/A) ENDOROTOR (N/A) as a surgical intervention.  The patient's history has been reviewed, patient examined, no change in status, stable for surgery.  I have reviewed the patient's chart and labs.  Questions were answered to the patient's satisfaction.     [employer]

## 2019-06-01 NOTE — Progress Notes (Signed)
Pharmacy Antibiotic Note  Caleb Chambers is a 54 y.o. male admitted on 05/12/2019 with DKA, then found to have infected pancreatitic pseudocyst/necrosis.  S/p drainage on 05/28/19 and fluid is growing Staph aureus.  Pharmacy has been consulted to add vancomycin to Cipro.   S/p necrosectomy today with repeat planned for 2/17. Flagyl stopped but vancomycin and cipro continued.   SCr 0.8, CrCL > 100 ml/min, afebrile, WBC 11.  Plan: Vanc 1750mg  IV Q12H for AUC 491 using SCr 0.8 Cipro 400mg  IV Q12H per MD Will check levels tomorrow if MRSA +  Height: 6' (182.9 cm) Weight: (!) 308 lb 3.3 oz (139.8 kg) IBW/kg (Calculated) : 77.6  Temp (24hrs), Avg:98.4 F (36.9 C), Min:97.7 F (36.5 C), Max:99.4 F (37.4 C)  Recent Labs  Lab 05/27/19 0224 05/28/19 0306 05/29/19 0124 05/30/19 0234 06/01/19 0244  WBC 7.4 9.5 7.2 6.0 11.7*  CREATININE 0.84 0.84 0.78 0.83 0.87    Estimated Creatinine Clearance: 144 mL/min (by C-G formula based on SCr of 0.87 mg/dL).    Allergies  Allergen Reactions  . Penicillins Anaphylaxis, Hives and Swelling    Did it involve swelling of the face/tongue/throat, SOB, or low BP? Yes Did it involve sudden or severe rash/hives, skin peeling, or any reaction on the inside of your mouth or nose? Yes Did you need to seek medical attention at a hospital or doctor's office? Yes When did it last happen?teenager If all above answers are "NO", may proceed with cephalosporin use.  . Metoprolol Other (See Comments)    Chest pains - reported by [facility] and  [facility] 02/08/14  . Clindamycin Rash and Other (See Comments)    Unknown reaction - reported by [facility] and [facility] 09-30-13    Vanc 2/12 >> Cipro 2/8 >> Flagyl 2/8 >>2/15  1/26 covid - negative 1/27 MRSA PCR - negative 2/8 C.diff - negative 2/11 pancreatic pseudocyst aspirate - Staph aureus (preliminary)  4/8 PharmD., BCPS Clinical Pharmacist 06/01/2019 12:27 PM

## 2019-06-01 NOTE — Progress Notes (Signed)
In and out cath performed following EGD, of dark colored urine returned.

## 2019-06-01 NOTE — Transfer of Care (Signed)
Immediate Anesthesia Transfer of Care Note  Patient: Caleb Chambers  Procedure(s) Performed: ESOPHAGOGASTRODUODENOSCOPY (EGD) with NECROSECTOMY (N/A ) ENDOROTOR (N/A ) STENT REMOVAL PANCREATIC STENT PLACEMENT  Patient Location: PACU  Anesthesia Type:General  Level of Consciousness: awake and alert   Airway & Oxygen Therapy: Patient Spontanous Breathing and Patient connected to nasal cannula oxygen  Post-op Assessment: Report given to RN and Post -op Vital signs reviewed and stable  Post vital signs: Reviewed and stable  Last Vitals:  Vitals Value Taken Time  BP 119/76 06/01/19 1051  Temp    Pulse 111 06/01/19 1053  Resp 22 06/01/19 1053  SpO2 95 % 06/01/19 1053  Vitals shown include unvalidated device data.  Last Pain:  Vitals:   06/01/19 0739  TempSrc: Temporal  PainSc: 0-No pain      Patients Stated Pain Goal: 2 (06/01/19 0245)  Complications: No apparent anesthesia complications

## 2019-06-01 NOTE — Progress Notes (Signed)
Pt with change in MEWS score. MD notified. RR notified. New orders received per MD. Will continue to monitor pt.

## 2019-06-01 NOTE — Op Note (Addendum)
[facility] Patient Name: Caleb Chambers Procedure Date : 06/01/2019 MRN: [id number] Attending MD: Justice Britain , MD Date of Birth: 05/09/1965 CSN: [id number] Age: 54 Admit Type: Inpatient Procedure:                Upper GI endoscopy Indications:              Pancreatic necrosis Providers:                Justice Britain, MD, Carlyn Reichert, RN, Lazaro Arms, Technician Referring MD:             Carol Ada, MD, [facility] Medicines:                General Anesthesia, Antibiotics were not                            administered during procedure as he is already                            scheduled or had these prior to his procedure                            today, Please see Anesthesia log for fluids/albumin                            that was administered during procedure Complications:            No immediate complications. Estimated Blood Loss:     Estimated blood loss was minimal. Procedure:                Pre-Anesthesia Assessment:                           - Prior to the procedure, a History and Physical                            was performed, and patient medications and                            allergies were reviewed. The patient's tolerance of                            previous anesthesia was also reviewed. The risks                            and benefits of the procedure and the sedation                            options and risks were discussed with the patient.                            All questions were answered, and informed consent  was obtained. Prior Anticoagulants: The patient has                            taken no previous anticoagulant or antiplatelet                            agents. ASA Grade Assessment: III - A patient with                            severe systemic disease. After reviewing the risks                            and benefits, the patient was deemed in                          satisfactory condition to undergo the procedure.                           After obtaining informed consent, the endoscope was                            passed under direct vision. Throughout the                            procedure, the patient's blood pressure, pulse, and                            oxygen saturations were monitored continuously. The                            [id number] ([id number]) Olympus therapeutic                            gastroscope was introduced through the mouth, and                            advanced to the second part of duodenum. The upper                            GI endoscopy was technically difficult and complex.                            The patient tolerated the procedure. Scope In: 8:28:28 AM Scope Out: 10:27:08 AM Total Procedure Duration: 1 hour 58 minutes 39 seconds  Findings:      No gross lesions were noted in the proximal esophagus.      LA Grade B (one or more mucosal breaks greater than 5 mm, not extending       between the tops of two mucosal folds) esophagitis with no bleeding was       found in the distal esophagus and the GE Junction.      Hematin (altered blood/coffee-ground-like material) was found in the       entire examined stomach. Lavage of the area was performed using copious       amounts, resulting in clearance  with adequate visualization.      Evidence of an AXIOS cystgastrostomy with 2 double pigtail stents       through it were found in the gastric body (posterior wall). This was       characterized by healthy appearing mucosa. The two double pigtail stents       were removed from the cystgastrostomy with a snare. The cyst was       completely filled with fluid and necrotic tissue as well as old clot       that was pasty and adherent to the cyst wall. Necrosectomy was performed       with Endorotor Powered Endoscopic Debrider, requiring multiple       intubations of the cyst. We performed necrosectomy  for 1 hour 45       minutes. I did not see any active bleeding, but I think the hematin       noted then I would consider it is from the clot. At the conclusion of       the procedure, a still significant amount of necrotic tissue and a       medium amount of pink, viable tissue was found within the cyst cavity on       direct vision. Two double pigtail stents were place - one 10 French x 5       cm and one 7 French x 7 cm were inserted across the cystgastrostomy       tract within the AXIOS to decrease risk of necroma closure of the       gastrostomy.      No gross lesions were noted in the duodenal bulb, in the first portion       of the duodenum and in the second portion of the duodenum. Impression:               - No gross lesions in esophagus. LA Grade B                            esophagitis with no bleeding distally and at [facility].                           - Hematin (altered blood/coffee-ground-like                            material) in the entire stomach. Lavaged.                           - An AXIOS pseudocystgastrostomy with 2 double                            pigtails was found, characterized by healthy                            appearing mucosa. These were removed.                           - Pancreatic Necrosis was noted with some blood  clots within the cavity. Necrosectomy was performed.                           - No gross lesions in the duodenal bulb, in the                            first portion of the duodenum and in the second                            portion of the duodenum. Recommendation:           - The patient will be observed post-procedure,                            until all discharge criteria are met.                           - Return patient to hospital ward for ongoing care.                           - Clear liquid diet.                           - Observe patient's clinical course.                            - Trend Hgb/Hct closely.                           - I sent a Type and Cross and I think based on the                            findings, even though he is not having active                            bleeding he may benefit from a unit of pRBCs.                           - I think Flagyl can be stopped, but I would keep                            the Ciprofloxacin on for now with the Vancomycin                            while susceptibilities return.                           - If Hgb were to trend in wrong direction then                            consider CT-AP with IV contrast to ensure no                            Pseudoaneurysm or  other source of bleeding is noted                            - though his hemoglobin has been unchanged overall                            since his procedure.                           - Repeat Necrosectomy on 2/17 to be scheduled .                           - The findings and recommendations were discussed                            with the patient.                           - The findings and recommendations were discussed                            with the patient's family.                           - The findings and recommendations were discussed                            with the referring physician. Procedure Code(s):        --- Professional ---                           [phone], Esophagogastroduodenoscopy, flexible,                            transoral; diagnostic, including collection of                            specimen(s) by brushing or washing, when performed                            (separate procedure)                           [phone], Unlisted procedure, pancreas Diagnosis Code(s):        --- Professional ---                           K20.90, Esophagitis, unspecified without bleeding                           K92.2, Gastrointestinal hemorrhage, unspecified                           Z98.890, Other specified  postprocedural states                           K86.89, Other specified diseases of pancreas  CPT copyright 2019 [employer]. All rights reserved. The codes documented in this report are preliminary and upon coder review may  be revised to meet current compliance requirements. Justice Britain, MD 06/01/2019 10:57:30 AM Number of Addenda: 0

## 2019-06-01 NOTE — Progress Notes (Signed)
[facility]  PROGRESS NOTE  MANLY NESTLE MWU:[id number] DOB: 09/11/1965 DOA: 05/12/2019 PCP: Patient, No Pcp Per   Brief HPI:   54 year old African-American male, morbidly obese, with past medical history significant for hypertension, DM, gout; and recent admission for pancreatitis and DKA.  Patient presented to the hospital with complaints of minimal oral intake since his discharge.  Patient reported shortness of breath and decreased ability to eat and drink.  He has not been fully compliant with insulin regimen as well. Patient was then admitted to the hospital again for DKA and acute kidney injury, prerenal.  DKA and acute kidney injury have resolved.  Patient is currently being managed for complications of severe pancreatitis.    Subjective   Patient seen and examined, s/p EGD, necrosectomy.   Assessment/Plan:     1. Severe pancreatitis with peripancreatic fluid collection-concern for necrotizing pancreatitis and questionable superimposed infection.  Repeat CT abdomen/pelvis on 05/25/2019 showed persistent pancreatitis with new scattered peripancreatic gas, extensive ill-defined peripancreatic fluid extending throughout transverse colon into the root of mesentery.  S/p cholecystectomy.  GI was consulted.  Patient underwent upper EUS with AX IOS cystogastrostomy on 05/28/2019 by Dr. Meridee Score.  Drainage from pancreatic pseudocyst growing moderate staph aureus, started on vancomycin.  NJ tube feed was removed for the procedure.  Patient  underwent EGD, necrosectomy today. 2. Recurrent DKA/diabetes mellitus type 2-resolved.  Hemoglobin A1c was 13.2 on 04/22/2019.  Continue Lantus 7 units subcu daily.  Sliding scale insulin with NovoLog. 3. History of hypertension-blood pressure is controlled. 4. Acute kidney injury-resolved 5. Hypochromic anemia-no obvious bleeding, lab work suggestive of anemia of chronic disease in setting of inflammation.  GI recommends giving 1 unit PRBC.  Will order  1 unit PRBC.    SpO2: 93 % O2 Flow Rate (L/min): 3 L/min   COVID-19 Labs  No results for input(s): DDIMER, FERRITIN, LDH, CRP in the last 72 hours.  Lab Results  Component Value Date   SARSCOV2NAA NEGATIVE 05/12/2019   SARSCOV2NAA NEGATIVE 04/20/2019     CBG: Recent Labs  Lab 05/31/19 1602 05/31/19 2123 06/01/19 0237 06/01/19 0635 06/01/19 1246  GLUCAP 142* 132* 184* 212* 207*    CBC: Recent Labs  Lab 05/27/19 0224 05/28/19 0306 05/29/19 0124 05/30/19 0234 06/01/19 0244  WBC 7.4 9.5 7.2 6.0 11.7*  NEUTROABS 4.7 7.9* 4.4 3.6 9.7*  HGB 7.6* 7.9* 7.6* 7.7* 7.8*  HCT 24.9* 25.6* 24.6* 25.3* 25.8*  MCV 77.6* 76.4* 76.2* 77.1* 79.6*  PLT 329 341 302 315 355    Basic Metabolic Panel: Recent Labs  Lab 05/27/19 0224 05/28/19 0306 05/29/19 0124 05/30/19 0234 05/31/19 1410 06/01/19 0244  NA 135 134* 136 133*  --  134*  K 3.8 4.2 3.9 4.1  --  4.0  CL 101 101 104 101  --  103  CO2 25 25 24 23   --  16*  GLUCOSE 219* 220* 77 104*  --  207*  BUN 10 7 8 9   --  8  CREATININE 0.84 0.84 0.78 0.83  --  0.87  CALCIUM 7.9* 8.0* 7.8* 7.7*  --  7.5*  MG 1.6*  --   --   --  1.5*  --      Liver Function Tests: Recent Labs  Lab 05/26/19 0509 05/27/19 0224 06/01/19 0244  AST 18 15 18   ALT 22 17 13   ALKPHOS 92 86 66  BILITOT 0.3 0.6 1.3*  PROT 7.0 6.9 6.9  ALBUMIN 1.1* 1.0* 1.2*  DVT prophylaxis: SCDs  Code Status: Full code  Family Communication: No family at bedside  Disposition Plan: likely home when medically ready for discharge         Scheduled medications:  . allopurinol  300 mg Oral Daily  . chlorhexidine  15 mL Mouth Rinse BID  . diphenoxylate-atropine  2 tablet Oral BID  . feeding supplement (ENSURE ENLIVE)  237 mL Oral TID BM  . insulin aspart  0-20 Units Subcutaneous TID WC  . insulin aspart  0-5 Units Subcutaneous QHS  . insulin glargine  7 Units Subcutaneous Daily  . lipase/protease/amylase  36,000 Units Oral TID AC  .  mouth rinse  15 mL Mouth Rinse q12n4p  . morphine  30 mg Oral Q12H  . multivitamin with minerals  1 tablet Oral Daily    Consultants:  Gastroenterology  Procedures:    Antibiotics:   Anti-infectives (From admission, onward)   Start     Dose/Rate Route Frequency Ordered Stop   05/30/19 1700  vancomycin (VANCOREADY) IVPB 1750 mg/350 mL  Status:  Discontinued     1,750 mg 175 mL/hr over 120 Minutes Intravenous Every 12 hours 05/29/19 1533 05/29/19 2206   05/30/19 1100  vancomycin (VANCOREADY) IVPB 1750 mg/350 mL     1,750 mg 175 mL/hr over 120 Minutes Intravenous Every 12 hours 05/29/19 2206     05/29/19 1545  vancomycin (VANCOCIN) 2,500 mg in sodium chloride 0.9 % 500 mL IVPB     2,500 mg 250 mL/hr over 120 Minutes Intravenous  Once 05/29/19 1533 05/30/19 0014   05/25/19 1500  ciprofloxacin (CIPRO) IVPB 400 mg     400 mg 200 mL/hr over 60 Minutes Intravenous Every 12 hours 05/25/19 1408     05/25/19 1500  metroNIDAZOLE (FLAGYL) IVPB 500 mg  Status:  Discontinued     500 mg 100 mL/hr over 60 Minutes Intravenous Every 8 hours 05/25/19 1408 06/01/19 1115       Objective   Vitals:   06/01/19 1050 06/01/19 1105 06/01/19 1120 06/01/19 1145  BP: 119/76 127/69 120/66 116/66  Pulse: (!) 108 (!) 114 (!) 114 (!) 112  Resp: (!) 26 20 (!) 23 (!) 22  Temp: 99.4 F (37.4 C)     TempSrc:      SpO2: 95% 96% 95% 93%  Weight:      Height:        Intake/Output Summary (Last 24 hours) at 06/01/2019 1616 Last data filed at 06/01/2019 1400 Gross per 24 hour  Intake 1846.45 ml  Output 1450 ml  Net 396.45 ml    02/13 1901 - 02/15 0700 In: 2150.4 [P.O.:300; I.V.:60] Out: 2250 [Urine:2250]  Filed Weights   05/30/19 0500 05/31/19 0312 06/01/19 3536  Weight: (!) 138.7 kg (!) 138.8 kg (!) 139.8 kg    Physical Examination:    General-appears in no acute distress  Heart-S1-S2, regular, no murmur auscultated  Lungs-clear to auscultation bilaterally, no wheezing or crackles  auscultated  Abdomen-soft, nontender, no organomegaly  Extremities-no edema in the lower extremities  Neuro-alert, oriented x3, no focal deficit noted    Data Reviewed:   Recent Results (from the past 240 hour(s))  C difficile quick scan w PCR reflex     Status: None   Collection Time: 05/25/19  8:15 AM   Specimen: STOOL  Result Value Ref Range Status   C Diff antigen NEGATIVE NEGATIVE Final   C Diff toxin NEGATIVE NEGATIVE Final   C Diff interpretation No C. difficile detected.  Final  Comment: Performed at [facility], [place] [address], [place], [place] [address]  Aerobic/Anaerobic Culture (surgical/deep wound)     Status: None (Preliminary result)   Collection Time: 05/28/19  1:58 PM   Specimen: PATH GI biopsy; Tissue  Result Value Ref Range Status   Specimen Description FLUID PANCREATIC PSUEDOCYST ASPIRATE  Final   Special Requests NONE  Final   Gram Stain   Final    ABUNDANT WBC PRESENT,BOTH PMN AND MONONUCLEAR ABUNDANT GRAM POSITIVE COCCI    Culture   Final    MODERATE STAPHYLOCOCCUS AUREUS BACTEROIDES FRAGILIS BETA LACTAMASE POSITIVE REPEATING SUSCEPTIBILITY FOR SAUR Performed at [facility], [place] [address], [place], [place] [address]    Report Status PENDING  Incomplete    No results for input(s): LIPASE, AMYLASE in the last 168 hours. No results for input(s): AMMONIA in the last 168 hours.  Cardiac Enzymes: No results for input(s): CKTOTAL, CKMB, CKMBINDEX, TROPONINI in the last 168 hours. BNP (last 3 results) Recent Labs    04/23/19 0450  BNP 99.2     Admission status: Observation/Inpatient: Based on patients clinical presentation and evaluation of above clinical data, I have made determination that patient meets Inpatient criteria at this time.   Oswald Hillock   [facility] If 7PM-7AM, please contact night-coverage at [email or web address], Office  [phone]  password [email or web address]  06/01/2019, 4:16 PM  LOS: 20 days

## 2019-06-01 NOTE — Plan of Care (Signed)

## 2019-06-01 NOTE — Progress Notes (Signed)
Nutrition Follow up   DOCUMENTATION CODES:   Severe malnutrition in context of acute illness/injury  INTERVENTION:   Consider replacement of Cortrak if intake does not improve with diet advancement as pt is severely malnourished.    Ensure Enlive po TID, each supplement provides 350 kcal and 20 grams of protein  Magic cup TID with meals, each supplement provides 290 kcal and 9 grams of protein  MVI daily   NUTRITION DIAGNOSIS:   Severe Malnutrition related to acute illness(pancreatitis) as evidenced by energy intake < or equal to 50% for > or equal to 5 days, moderate fat depletion, mild fat depletion, moderate muscle depletion, mild muscle depletion.  Ongoing  GOAL:   Patient will meet greater than or equal to 90% of their needs  Not meeting  MONITOR:   TF tolerance, Weight trends, Labs, I & O's  REASON FOR ASSESSMENT:   Consult Assessment of nutrition requirement/status  ASSESSMENT:   Pt with a PMH significant for HTN, DM, gout, and recent admission for pancreatitis and DKA presented to ED with DKA, abdominal pain, SOB, and minimal PO intake since last admission.   1/28- s/p post pyloric small bore feeding tube placement (reads at the LOT) 2/11- s/p endoscopic cyst drainage, Cortrak removed during procedure   Pt underwent endoscopic necrosectomy this am. Diet advanced to regular consistency. RD to add supplementation.   Pt has been on a clear liquid diet for the last 4 days. Decline replacement of Cortrak in between procedures. If intake does not progress with diet advancement, may need to consider replacing Cortrak as pt is malnourished. Diarrhea continues. Likely related to abx therapy.   Admission weight: 133.3 kg  Current weight: 139.8 kg   I/O: +7,356 ml since 2/21 UOP: 1,600 ml x 24 hrs   Medications: SS novolog, lantus, lomotil, creon Labs: Na 134 (L) CBG 132-212  Diet Order:   Diet Order            Diet heart healthy/carb modified Room service  appropriate? Yes; Fluid consistency: Thin  Diet effective now              EDUCATION NEEDS:   Not appropriate for education at this time  Skin:  Skin Assessment: Reviewed RN Assessment  Last BM:  2/14  Height:   Ht Readings from Last 1 Encounters:  05/12/19 6' (1.829 m)    Weight:   Wt Readings from Last 1 Encounters:  06/01/19 (!) 139.8 kg    Ideal Body Weight:  80.9 kg  BMI:  Body mass index is 41.8 kg/m.  Estimated Nutritional Needs:   Kcal:  2300-2500  Protein:  130-145 grams  Fluid:  >2L/d  Vanessa Kick RD, LDN Clinical Nutrition Pager # [phone]

## 2019-06-01 NOTE — Anesthesia Postprocedure Evaluation (Signed)
Anesthesia Post Note  Patient: Caleb Chambers  Procedure(s) Performed: ESOPHAGOGASTRODUODENOSCOPY (EGD) with NECROSECTOMY (N/A ) ENDOROTOR (N/A ) STENT REMOVAL PANCREATIC STENT PLACEMENT     Patient location during evaluation: Endoscopy Anesthesia Type: General Level of consciousness: awake and alert Pain management: pain level controlled Vital Signs Assessment: post-procedure vital signs reviewed and stable Respiratory status: spontaneous breathing, nonlabored ventilation, respiratory function stable and patient connected to nasal cannula oxygen Cardiovascular status: blood pressure returned to baseline and stable Postop Assessment: no apparent nausea or vomiting Anesthetic complications: no    Last Vitals:  Vitals:   06/01/19 1854 06/01/19 2044  BP: (!) 106/56 121/73  Pulse: 97   Resp: 19 18  Temp: 36.6 C 36.7 C  SpO2: 98% 98%    Last Pain:  Vitals:   06/01/19 2044  TempSrc: Oral  PainSc: 0-No pain                 Nylen Creque P Kenneisha Cochrane

## 2019-06-02 LAB — CBC
HCT: 22.6 % — ABNORMAL LOW (ref 39.0–52.0)
Hemoglobin: 7.1 g/dL — ABNORMAL LOW (ref 13.0–17.0)
MCH: 24.7 pg — ABNORMAL LOW (ref 26.0–34.0)
MCHC: 31.4 g/dL (ref 30.0–36.0)
MCV: 78.5 fL — ABNORMAL LOW (ref 80.0–100.0)
Platelets: 333 10*3/uL (ref 150–400)
RBC: 2.88 MIL/uL — ABNORMAL LOW (ref 4.22–5.81)
RDW: 16.6 % — ABNORMAL HIGH (ref 11.5–15.5)
WBC: 7.5 10*3/uL (ref 4.0–10.5)
nRBC: 1.5 % — ABNORMAL HIGH (ref 0.0–0.2)

## 2019-06-02 LAB — COMPREHENSIVE METABOLIC PANEL
ALT: 12 U/L (ref 0–44)
AST: 14 U/L — ABNORMAL LOW (ref 15–41)
Albumin: 1.3 g/dL — ABNORMAL LOW (ref 3.5–5.0)
Alkaline Phosphatase: 59 U/L (ref 38–126)
Anion gap: 9 (ref 5–15)
BUN: 11 mg/dL (ref 6–20)
CO2: 19 mmol/L — ABNORMAL LOW (ref 22–32)
Calcium: 7.4 mg/dL — ABNORMAL LOW (ref 8.9–10.3)
Chloride: 106 mmol/L (ref 98–111)
Creatinine, Ser: 0.96 mg/dL (ref 0.61–1.24)
GFR calc Af Amer: >60 mL/min (ref >60)
GFR calc non Af Amer: >60 mL/min (ref >60)
Glucose, Bld: 280 mg/dL — ABNORMAL HIGH (ref 70–99)
Potassium: 3.9 mmol/L (ref 3.5–5.1)
Sodium: 134 mmol/L — ABNORMAL LOW (ref 135–145)
Total Bilirubin: 0.9 mg/dL (ref 0.3–1.2)
Total Protein: 6.2 g/dL — ABNORMAL LOW (ref 6.5–8.1)

## 2019-06-02 LAB — GLUCOSE, CAPILLARY
Glucose-Capillary: 263 mg/dL — ABNORMAL HIGH (ref 70–99)
Glucose-Capillary: 221 mg/dL — ABNORMAL HIGH (ref 70–99)
Glucose-Capillary: 247 mg/dL — ABNORMAL HIGH (ref 70–99)
Glucose-Capillary: 228 mg/dL — ABNORMAL HIGH (ref 70–99)

## 2019-06-02 MED ORDER — METRONIDAZOLE IN NACL 5-0.79 MG/ML-% IV SOLN
500.0000 mg | Freq: Three times a day (TID) | INTRAVENOUS | Status: DC
  Administered 2019-06-02 – 2019-06-09 (×20): 500 mg via INTRAVENOUS
  Filled 2019-06-02 (×20): qty 100

## 2019-06-02 NOTE — Progress Notes (Addendum)
Inpatient Rehabilitation Admissions Coordinator  Noted patient requesting to work with only one therapy per day. I will sign off . Please re consult if he wishes to pursue CIR. RN CM, Kristi and SW, Morrie Sheldon made aware.   Ottie Glazier, RN, MSN Rehab Admissions Coordinator [phone] 06/02/2019 5:08 PM

## 2019-06-02 NOTE — Progress Notes (Signed)
PT Cancellation Note  Patient Details Name: Caleb Chambers MRN: [id number] DOB: 1966/01/28   Cancelled Treatment:    Reason Eval/Treat Not Completed: Fatigue/lethargy limiting ability to participate. Pt declining secondary to fatigue, reports "it's too much" to work with OT and PT in same day. Discussed expectations of 3 hrs/day of therapies at CIR; pt prefers less-intensive SNF-level therapies. Educ pt on acute PT's refusal policy, as this is third treatment session in a row that pt has declined to participate; pt would like to remain on caseload and plans to work with [place] next time.  Ina Homes, PT, DPT Acute Rehabilitation Services  Pager [phone] Office [phone]  [name] 06/02/2019, 2:32 PM

## 2019-06-02 NOTE — Plan of Care (Signed)

## 2019-06-02 NOTE — Progress Notes (Signed)
Occupational Therapy Treatment Patient Details Name: Caleb BROUILLET MRN: [id number] DOB: March 27, 1966 Today's Date: 06/02/2019    History of present illness Pt is a 54 year old man admitted 05/12/19 with N/V, abdominal pain and DKA. Pt discharged from [facility] on 04/29/19 with DKA and pancreatitis. He has not been able to eat much since his discharge. PMH: morbid obesity, HTN, gout, Crohn's disease, IDDM.    OT comments  Pt progressing to OOB ADL for showering task  In sitting/standing with supervisionA and minA for back. Pt demonstrating decreased activity tolerance during ADL task of showering in bathroom and dressing task in sitting. HR increased throughout; O2 >95% on RA. Pt ambulating in room with RW- stable and not requiring chair follow. Pt will benefit from continued OT. Pt not appropriate for CIR due to poor activity tolerance. OT following acutely.    Follow Up Recommendations  SNF(pt unable to tolerate more than 1 therapy per day)    Equipment Recommendations  3 in 1 bedside commode(for wide adult)    Recommendations for Other Services      Precautions / Restrictions Precautions Precautions: Fall Restrictions Weight Bearing Restrictions: No       Mobility Bed Mobility Overal bed mobility: Needs Assistance Bed Mobility: Supine to Sit     Supine to sit: Supervision        Transfers Overall transfer level: Needs assistance Equipment used: Rolling walker (2 wheeled) Transfers: Sit to/from Stand Sit to Stand: Min guard Stand pivot transfers: Min guard       General transfer comment: for safety    Balance Overall balance assessment: Needs assistance   Sitting balance-Leahy Scale: Good     Standing balance support: Single extremity supported;During functional activity Standing balance-Leahy Scale: Fair Standing balance comment: requires at least 1UE for support                           ADL either performed or assessed with clinical judgement   ADL  Overall ADL's : Needs assistance/impaired     Grooming: Set up;Sitting   Upper Body Bathing: Supervision/ safety;Standing;Cueing for safety;Sitting Upper Body Bathing Details (indicate cue type and reason): using grab bar in shower sit/stand Lower Body Bathing: Supervison/ safety;Cueing for safety;Sitting/lateral leans;Sit to/from stand Lower Body Bathing Details (indicate cue type and reason): using grab bar in BA Upper Body Dressing : Set up;Sitting   Lower Body Dressing: Minimal assistance;Sitting/lateral leans;Sit to/from stand Lower Body Dressing Details (indicate cue type and reason): bends forward; able to doff socks in standing with 1 arm supported by wall; in sitting pt requires increased assist Toilet Transfer: Min guard;BSC   Toileting- Clothing Manipulation and Hygiene: Min guard;Sitting/lateral lean;Sit to/from stand       Functional mobility during ADLs: Rolling walker;Min guard General ADL Comments: Pt demonstrating decreased activity tolerance during ADL task of showering in bathroom and dressing task in sitting. HR increased throughout; O2 >95% on RA;.     Vision   Vision Assessment?: No apparent visual deficits   Perception     Praxis      Cognition Arousal/Alertness: Awake/alert Behavior During Therapy: WFL for tasks assessed/performed Overall Cognitive Status: Within Functional Limits for tasks assessed                                 General Comments: Pt following all commands; smiling and enagaging in conversations with OTR  Exercises     Shoulder Instructions       General Comments Pt ambulating in room with RW- stable and not requiring chair follow. Pt very appreciative of therapist    Pertinent Vitals/ Pain       Pain Assessment: 0-10 Pain Score: 3  Pain Location: abdomen Pain Descriptors / Indicators: Constant;Discomfort Pain Intervention(s): Monitored during session;Patient requesting pain meds-RN notified  Home  Living                                          Prior Functioning/Environment              Frequency  Min 2X/week        Progress Toward Goals  OT Goals(current goals can now be found in the care plan section)  Progress towards OT goals: Progressing toward goals  Acute Rehab OT Goals Patient Stated Goal: feel better OT Goal Formulation: With patient Time For Goal Achievement: 06/16/19 Potential to Achieve Goals: Good ADL Goals Pt Will Perform Grooming: with supervision;standing Pt Will Perform Upper Body Bathing: with set-up;sitting Pt Will Perform Lower Body Bathing: with supervision;sit to/from stand Pt Will Perform Upper Body Dressing: with set-up;sitting Pt Will Perform Lower Body Dressing: with supervision;sit to/from stand Pt Will Transfer to Toilet: with supervision Pt Will Perform Toileting - Clothing Manipulation and hygiene: with supervision;sit to/from stand  Plan Discharge plan needs to be updated    Co-evaluation                 AM-PAC OT "6 Clicks" Daily Activity     Outcome Measure   Help from another person eating meals?: None Help from another person taking care of personal grooming?: A Little Help from another person toileting, which includes using toliet, bedpan, or urinal?: A Little Help from another person bathing (including washing, rinsing, drying)?: A Lot Help from another person to put on and taking off regular upper body clothing?: None Help from another person to put on and taking off regular lower body clothing?: A Little 6 Click Score: 19    End of Session Equipment Utilized During Treatment: Rolling walker  OT Visit Diagnosis: Unsteadiness on feet (R26.81);Pain;Muscle weakness (generalized) (M62.81) Pain - part of body: (abdomen)   Activity Tolerance Patient tolerated treatment well;Patient limited by fatigue   Patient Left in chair;with call bell/phone within reach   Nurse Communication Mobility  status        Time: 6283-6629 OT Time Calculation (min): 45 min  Charges: OT General Charges $OT Visit: 1 Visit OT Treatments $Self Care/Home Management : 23-37 mins $Therapeutic Activity: 8-22 mins  Caleb Chambers, OTR/L Acute Rehabilitation Services Pager: [phone] Office: [phone]    Caleb Chambers 06/02/2019, 4:47 PM

## 2019-06-02 NOTE — Progress Notes (Signed)
Pharmacy Progress Note   I spoke with Dr. Elnoria Howard this morning about Mr. Smick pancreatic pseudocyst aspirate culture that is growing staph aureus (susceptibilities pending) and bacteroides fragilis.   I re-started Flagyl for the Bacteroides and discontinued ciprofloxacin per Dr. Elnoria Howard. The patient continues on Vancomycin while awaiting staph aureus susceptibilities.    Sharin Mons, PharmD, BCPS, BCIDP Infectious Diseases Clinical Pharmacist Phone: [phone] 06/02/2019 11:08 AM

## 2019-06-02 NOTE — Progress Notes (Signed)
Subjective: He continues to feel better.  He was able to tolerate fruit yesterday.  Objective: Vital signs in last 24 hours: Temp:  [97.6 F (36.4 C)-99.4 F (37.4 C)] 97.8 F (36.6 C) (02/16 0415) Pulse Rate:  [93-130] 94 (02/16 0415) Resp:  [15-30] 15 (02/16 0415) BP: (104-127)/(56-76) 104/67 (02/16 0415) SpO2:  [93 %-98 %] 98 % (02/16 0415) Weight:  [140 kg] 140 kg (02/16 0101) Last BM Date: 05/31/19  Intake/Output from previous day: 02/15 0701 - 02/16 0700 In: 2350 [P.O.:120; Blood:630; IV Piggyback:1600] Out: 925 [Urine:925] Intake/Output this shift: Total I/O In: 865 [Blood:315; IV Piggyback:550] Out: 275 [Urine:275]  General appearance: alert and no distress GI: soft, non-tender; bowel sounds normal; no masses,  no organomegaly  Lab Results: Recent Labs    06/01/19 0244 06/02/19 0231  WBC 11.7* 7.5  HGB 7.8* 7.1*  HCT 25.8* 22.6*  PLT 355 333   BMET Recent Labs    06/01/19 0244 06/02/19 0231  NA 134* 134*  K 4.0 3.9  CL 103 106  CO2 16* 19*  GLUCOSE 207* 280*  BUN 8 11  CREATININE 0.87 0.96  CALCIUM 7.5* 7.4*   LFT Recent Labs    06/02/19 0231  PROT 6.2*  ALBUMIN 1.3*  AST 14*  ALT 12  ALKPHOS 59  BILITOT 0.9   PT/INR No results for input(s): LABPROT, INR in the last 72 hours. Hepatitis Panel No results for input(s): HEPBSAG, HCVAB, HEPAIGM, HEPBIGM in the last 72 hours. C-Diff No results for input(s): CDIFFTOX in the last 72 hours. Fecal Lactopherrin No results for input(s): FECLLACTOFRN in the last 72 hours.  Studies/Results: No results found.  Medications:  Scheduled: . sodium chloride   Intravenous Once  . allopurinol  300 mg Oral Daily  . calcium carbonate  1 tablet Oral Daily  . chlorhexidine  15 mL Mouth Rinse BID  . diphenoxylate-atropine  2 tablet Oral BID  . enoxaparin (LOVENOX) injection  40 mg Subcutaneous Q24H  . feeding supplement (ENSURE ENLIVE)  237 mL Oral TID BM  . insulin aspart  0-20 Units Subcutaneous TID  WC  . insulin aspart  0-5 Units Subcutaneous QHS  . insulin glargine  7 Units Subcutaneous Daily  . lipase/protease/amylase  36,000 Units Oral TID AC  . mouth rinse  15 mL Mouth Rinse q12n4p  . morphine  30 mg Oral Q12H  . multivitamin with minerals  1 tablet Oral Daily   Continuous: . ciprofloxacin 400 mg (06/02/19 0410)  . vancomycin 1,750 mg (06/02/19 0059)    Assessment/Plan: 1) Infected pancreatic pseudocyst/necrosis s/p drainage and necrosectomy. 2) Severe malnutrition.   There is a clear improvement in his clinical status.  He is feeling much better and he is improving with Dr. Elesa Hacker interventions.  The sensitivities are still pending for the S. Aureus and he will remain on Vanc and Cipro for now.  His PO intake is improving.  His diarrhea resolved with the initiation of Creon.  Plan: 1) Further necrosectomy per Dr. Meridee Score. 2) Await sensitivities. 3) Continue with PO intake.  LOS: 21 days   Daril Warga D 06/02/2019, 6:48 AM

## 2019-06-02 NOTE — Progress Notes (Signed)
[facility]  PROGRESS NOTE  Caleb Chambers TFT:[id number] DOB: 1966/01/01 DOA: 05/12/2019 PCP: Patient, No Pcp Per   Brief HPI:   54 year old African-American male, morbidly obese, with past medical history significant for hypertension, DM, gout; and recent admission for pancreatitis and DKA.  Patient presented to the hospital with complaints of minimal oral intake since his discharge.  Patient reported shortness of breath and decreased ability to eat and drink.  He has not been fully compliant with insulin regimen as well. Patient was then admitted to the hospital again for DKA and acute kidney injury, prerenal.  DKA and acute kidney injury have resolved.  Patient is currently being managed for complications of severe pancreatitis.    Subjective   Patient seen and examined, denies any pain.  S/p 1 unit PRBC yesterday.  Hemoglobin was 7.1 last night.   Assessment/Plan:     1. Severe pancreatitis with peripancreatic fluid collection-concern for necrotizing pancreatitis and questionable superimposed infection.  Repeat CT abdomen/pelvis on 05/25/2019 showed persistent pancreatitis with new scattered peripancreatic gas, extensive ill-defined peripancreatic fluid extending throughout transverse colon into the root of mesentery.  S/p cholecystectomy.  GI was consulted.  Patient underwent upper EUS with AX IOS cystogastrostomy on 05/28/2019 by Dr. Meridee Score.  Drainage from pancreatic pseudocyst growing moderate staph aureus, started on vancomycin.  NJ tube feed was removed for the procedure.  Patient  underwent EGD, necrosectomy on 06/01/2019. 2. Recurrent DKA/diabetes mellitus type 2-resolved.  Hemoglobin A1c was 13.2 on 04/22/2019.  Continue Lantus 7 units subcu daily.  Sliding scale insulin with NovoLog. 3. History of hypertension-blood pressure is controlled. 4. Acute kidney injury-resolved 5. Hypochromic anemia-no obvious bleeding, lab work suggestive of anemia of chronic disease in setting of  inflammation.  GI recommended 1 unit PRBC after the procedure yesterday.  Patient received 1 unit PRBC, hemoglobin is 7.1 after the blood transfusion.  We will repeat CBC today and transfuse as needed for hemoglobin less than 7.      SpO2: 98 % O2 Flow Rate (L/min): 3 L/min   COVID-19 Labs  No results for input(s): DDIMER, FERRITIN, LDH, CRP in the last 72 hours.  Lab Results  Component Value Date   SARSCOV2NAA NEGATIVE 05/12/2019   SARSCOV2NAA NEGATIVE 04/20/2019     CBG: Recent Labs  Lab 06/01/19 0635 06/01/19 1246 06/01/19 1707 06/01/19 2117 06/02/19 0615  GLUCAP 212* 207* 289* 226* 263*    CBC: Recent Labs  Lab 05/27/19 0224 05/27/19 0224 05/28/19 0306 05/29/19 0124 05/30/19 0234 06/01/19 0244 06/02/19 0231  WBC 7.4   < > 9.5 7.2 6.0 11.7* 7.5  NEUTROABS 4.7  --  7.9* 4.4 3.6 9.7*  --   HGB 7.6*   < > 7.9* 7.6* 7.7* 7.8* 7.1*  HCT 24.9*   < > 25.6* 24.6* 25.3* 25.8* 22.6*  MCV 77.6*   < > 76.4* 76.2* 77.1* 79.6* 78.5*  PLT 329   < > 341 302 315 355 333   < > = values in this interval not displayed.    Basic Metabolic Panel: Recent Labs  Lab 05/27/19 0224 05/27/19 0224 05/28/19 0306 05/29/19 0124 05/30/19 0234 05/31/19 1410 06/01/19 0244 06/02/19 0231  NA 135   < > 134* 136 133*  --  134* 134*  K 3.8   < > 4.2 3.9 4.1  --  4.0 3.9  CL 101   < > 101 104 101  --  103 106  CO2 25   < > 25 24 23   --  16* 19*  GLUCOSE 219*   < > 220* 77 104*  --  207* 280*  BUN 10   < > 7 8 9   --  8 11  CREATININE 0.84   < > 0.84 0.78 0.83  --  0.87 0.96  CALCIUM 7.9*   < > 8.0* 7.8* 7.7*  --  7.5* 7.4*  MG 1.6*  --   --   --   --  1.5*  --   --    < > = values in this interval not displayed.     Liver Function Tests: Recent Labs  Lab 05/27/19 0224 06/01/19 0244 06/02/19 0231  AST 15 18 14*  ALT 17 13 12   ALKPHOS 86 66 59  BILITOT 0.6 1.3* 0.9  PROT 6.9 6.9 6.2*  ALBUMIN 1.0* 1.2* 1.3*        DVT prophylaxis: SCDs  Code Status: Full  code  Family Communication: No family at bedside  Disposition Plan: likely home when medically ready for discharge       Scheduled medications:  . sodium chloride   Intravenous Once  . allopurinol  300 mg Oral Daily  . calcium carbonate  1 tablet Oral Daily  . chlorhexidine  15 mL Mouth Rinse BID  . diphenoxylate-atropine  2 tablet Oral BID  . enoxaparin (LOVENOX) injection  40 mg Subcutaneous Q24H  . feeding supplement (ENSURE ENLIVE)  237 mL Oral TID BM  . insulin aspart  0-20 Units Subcutaneous TID WC  . insulin aspart  0-5 Units Subcutaneous QHS  . insulin glargine  7 Units Subcutaneous Daily  . lipase/protease/amylase  36,000 Units Oral TID AC  . mouth rinse  15 mL Mouth Rinse q12n4p  . morphine  30 mg Oral Q12H  . multivitamin with minerals  1 tablet Oral Daily    Consultants:  Gastroenterology  Procedures:    Antibiotics:   Anti-infectives (From admission, onward)   Start     Dose/Rate Route Frequency Ordered Stop   05/30/19 1700  vancomycin (VANCOREADY) IVPB 1750 mg/350 mL  Status:  Discontinued     1,750 mg 175 mL/hr over 120 Minutes Intravenous Every 12 hours 05/29/19 1533 05/29/19 2206   05/30/19 1100  vancomycin (VANCOREADY) IVPB 1750 mg/350 mL     1,750 mg 175 mL/hr over 120 Minutes Intravenous Every 12 hours 05/29/19 2206     05/29/19 1545  vancomycin (VANCOCIN) 2,500 mg in sodium chloride 0.9 % 500 mL IVPB     2,500 mg 250 mL/hr over 120 Minutes Intravenous  Once 05/29/19 1533 05/30/19 0014   05/25/19 1500  ciprofloxacin (CIPRO) IVPB 400 mg     400 mg 200 mL/hr over 60 Minutes Intravenous Every 12 hours 05/25/19 1408     05/25/19 1500  metroNIDAZOLE (FLAGYL) IVPB 500 mg  Status:  Discontinued     500 mg 100 mL/hr over 60 Minutes Intravenous Every 8 hours 05/25/19 1408 06/01/19 1115       Objective   Vitals:   06/01/19 2150 06/01/19 2310 06/02/19 0101 06/02/19 0415  BP: 110/76 109/63  104/67  Pulse:  93  94  Resp: 19 18  15   Temp: 98.1  F (36.7 C) 98.1 F (36.7 C)  97.8 F (36.6 C)  TempSrc: Oral Oral  Oral  SpO2: 97% 98%  98%  Weight:   (!) 140 kg   Height:        Intake/Output Summary (Last 24 hours) at 06/02/2019 0940 Last data filed at 06/02/2019 0600  Gross per 24 hour  Intake 2100 ml  Output 925 ml  Net 1175 ml    02/14 1901 - 02/16 0700 In: 3596.5 [P.O.:120; I.V.:60] Out: 1625 [Urine:1625]  Filed Weights   05/31/19 0312 06/01/19 0637 06/02/19 0101  Weight: (!) 138.8 kg (!) 139.8 kg (!) 140 kg    Physical Examination:    General-appears in no acute distress  Heart-S1-S2, regular, no murmur auscultated  Lungs-clear to auscultation bilaterally, no wheezing or crackles auscultated  Abdomen-soft, nontender, no organomegaly  Extremities-no edema in the lower extremities  Neuro-alert, oriented x3, no focal deficit noted    Data Reviewed:   Recent Results (from the past 240 hour(s))  C difficile quick scan w PCR reflex     Status: None   Collection Time: 05/25/19  8:15 AM   Specimen: STOOL  Result Value Ref Range Status   C Diff antigen NEGATIVE NEGATIVE Final   C Diff toxin NEGATIVE NEGATIVE Final   C Diff interpretation No C. difficile detected.  Final    Comment: Performed at [facility], [address] [place], [place] [address]  Aerobic/Anaerobic Culture (surgical/deep wound)     Status: None (Preliminary result)   Collection Time: 05/28/19  1:58 PM   Specimen: PATH GI biopsy; Tissue  Result Value Ref Range Status   Specimen Description FLUID PANCREATIC PSUEDOCYST ASPIRATE  Final   Special Requests NONE  Final   Gram Stain   Final    ABUNDANT WBC PRESENT,BOTH PMN AND MONONUCLEAR ABUNDANT GRAM POSITIVE COCCI    Culture   Final    MODERATE STAPHYLOCOCCUS AUREUS BACTEROIDES FRAGILIS BETA LACTAMASE POSITIVE REPEATING SUSCEPTIBILITY FOR SAUR Performed at [facility], [address] [place], [place] [address]    Report Status PENDING  Incomplete    No results for  input(s): LIPASE, AMYLASE in the last 168 hours. No results for input(s): AMMONIA in the last 168 hours.  Cardiac Enzymes: No results for input(s): CKTOTAL, CKMB, CKMBINDEX, TROPONINI in the last 168 hours. BNP (last 3 results) Recent Labs    04/23/19 0450  BNP 99.2     Admission status: Observation/Inpatient: Based on patients clinical presentation and evaluation of above clinical data, I have made determination that patient meets Inpatient criteria at this time.   [name]   [facility] If 7PM-7AM, please contact night-coverage at [email or web address], Office  [phone]  password [email or web address]  06/02/2019, 9:40 AM  LOS: 21 days

## 2019-06-03 ENCOUNTER — Inpatient Hospital Stay (HOSPITAL_COMMUNITY): Payer: BC Managed Care – PPO | Admitting: Anesthesiology

## 2019-06-03 ENCOUNTER — Encounter (HOSPITAL_COMMUNITY)
Admission: EM | Disposition: A | Discharge status: Home Health | Payer: Self-pay | Source: Home / Self Care | Attending: Internal Medicine

## 2019-06-03 ENCOUNTER — Encounter (HOSPITAL_COMMUNITY): Payer: Self-pay | Admitting: Internal Medicine

## 2019-06-03 HISTORY — PX: STENT REMOVAL: SHX6421

## 2019-06-03 HISTORY — PX: PANCREATIC STENT PLACEMENT: SHX5539

## 2019-06-03 HISTORY — PX: ESOPHAGOGASTRODUODENOSCOPY: SHX5428

## 2019-06-03 HISTORY — PX: ENDOROTOR: SHX6859

## 2019-06-03 LAB — GLUCOSE, CAPILLARY
Glucose-Capillary: 203 mg/dL — ABNORMAL HIGH (ref 70–99)
Glucose-Capillary: 167 mg/dL — ABNORMAL HIGH (ref 70–99)
Glucose-Capillary: 253 mg/dL — ABNORMAL HIGH (ref 70–99)
Glucose-Capillary: 182 mg/dL — ABNORMAL HIGH (ref 70–99)

## 2019-06-03 LAB — BASIC METABOLIC PANEL
Anion gap: 9 (ref 5–15)
BUN: 8 mg/dL (ref 6–20)
CO2: 20 mmol/L — ABNORMAL LOW (ref 22–32)
Calcium: 7.6 mg/dL — ABNORMAL LOW (ref 8.9–10.3)
Chloride: 104 mmol/L (ref 98–111)
Creatinine, Ser: 0.77 mg/dL (ref 0.61–1.24)
GFR calc Af Amer: >60 mL/min (ref >60)
GFR calc non Af Amer: >60 mL/min (ref >60)
Glucose, Bld: 221 mg/dL — ABNORMAL HIGH (ref 70–99)
Potassium: 3.5 mmol/L (ref 3.5–5.1)
Sodium: 133 mmol/L — ABNORMAL LOW (ref 135–145)

## 2019-06-03 LAB — PHOSPHORUS: Phosphorus: 2.5 mg/dL (ref 2.5–4.6)

## 2019-06-03 LAB — MAGNESIUM: Magnesium: 1.4 mg/dL — ABNORMAL LOW (ref 1.7–2.4)

## 2019-06-03 LAB — CBC
HCT: 23.8 % — ABNORMAL LOW (ref 39.0–52.0)
Hemoglobin: 7.4 g/dL — ABNORMAL LOW (ref 13.0–17.0)
MCH: 24.8 pg — ABNORMAL LOW (ref 26.0–34.0)
MCHC: 31.1 g/dL (ref 30.0–36.0)
MCV: 79.9 fL — ABNORMAL LOW (ref 80.0–100.0)
Platelets: 401 10*3/uL — ABNORMAL HIGH (ref 150–400)
RBC: 2.98 MIL/uL — ABNORMAL LOW (ref 4.22–5.81)
RDW: 17.4 % — ABNORMAL HIGH (ref 11.5–15.5)
WBC: 8.7 10*3/uL (ref 4.0–10.5)
nRBC: 1.6 % — ABNORMAL HIGH (ref 0.0–0.2)

## 2019-06-03 SURGERY — EGD (ESOPHAGOGASTRODUODENOSCOPY)
Anesthesia: General

## 2019-06-03 MED ORDER — ALBUMIN HUMAN 5 % IV SOLN: INTRAVENOUS | Status: DC | PRN

## 2019-06-03 MED ORDER — SODIUM CHLORIDE 0.9 % IV SOLN: INTRAVENOUS | Status: DC

## 2019-06-03 MED ORDER — MIDAZOLAM HCL 5 MG/5ML IJ SOLN
INTRAMUSCULAR | Status: DC | PRN
  Administered 2019-06-03 (×2): 1 mg via INTRAVENOUS

## 2019-06-03 MED ORDER — CHLORHEXIDINE GLUCONATE CLOTH 2 % EX PADS
6.0000 | MEDICATED_PAD | Freq: Every day | CUTANEOUS | Status: DC
  Administered 2019-06-04 – 2019-06-18 (×7): 6 via TOPICAL

## 2019-06-03 MED ORDER — INSULIN GLARGINE 100 UNIT/ML ~~LOC~~ SOLN
10.0000 [IU] | Freq: Every day | SUBCUTANEOUS | Status: DC
  Administered 2019-06-04 – 2019-06-11 (×7): 10 [IU] via SUBCUTANEOUS
  Filled 2019-06-03 (×9): qty 0.1

## 2019-06-03 MED ORDER — PROPOFOL 10 MG/ML IV BOLUS
INTRAVENOUS | Status: DC | PRN
  Administered 2019-06-03: 50 mg via INTRAVENOUS
  Administered 2019-06-03: 120 mg via INTRAVENOUS

## 2019-06-03 MED ORDER — SUGAMMADEX SODIUM 200 MG/2ML IV SOLN
INTRAVENOUS | Status: DC | PRN
  Administered 2019-06-03: 300 mg via INTRAVENOUS

## 2019-06-03 MED ORDER — MAGNESIUM SULFATE 2 GM/50ML IV SOLN: 2.0000 g | Freq: Once | INTRAVENOUS | Status: AC

## 2019-06-03 MED ORDER — LIDOCAINE HCL (CARDIAC) PF 100 MG/5ML IV SOSY
PREFILLED_SYRINGE | INTRAVENOUS | Status: DC | PRN
  Administered 2019-06-03: 30 mg via INTRAVENOUS

## 2019-06-03 MED ORDER — LACTATED RINGERS IV SOLN: INTRAVENOUS | Status: DC | PRN

## 2019-06-03 MED ORDER — FENTANYL CITRATE (PF) 100 MCG/2ML IJ SOLN
INTRAMUSCULAR | Status: DC | PRN
  Administered 2019-06-03: 100 ug via INTRAVENOUS

## 2019-06-03 MED ORDER — SODIUM CHLORIDE 0.9% FLUSH
10.0000 mL | INTRAVENOUS | Status: DC | PRN
  Administered 2019-06-09 – 2019-06-15 (×2): 10 mL

## 2019-06-03 MED ORDER — ROCURONIUM BROMIDE 50 MG/5ML IV SOSY
PREFILLED_SYRINGE | INTRAVENOUS | Status: DC | PRN
  Administered 2019-06-03: 20 mg via INTRAVENOUS
  Administered 2019-06-03: 50 mg via INTRAVENOUS

## 2019-06-03 MED ORDER — ONDANSETRON HCL 4 MG/2ML IJ SOLN
INTRAMUSCULAR | Status: DC | PRN
  Administered 2019-06-03: 4 mg via INTRAVENOUS

## 2019-06-03 MED ORDER — MAGNESIUM SULFATE 2 GM/50ML IV SOLN
2.0000 g | Freq: Once | INTRAVENOUS | Status: AC
  Administered 2019-06-03: 13:00:00 2 g via INTRAVENOUS
  Filled 2019-06-03: qty 50

## 2019-06-03 NOTE — Progress Notes (Addendum)
Upon entering patient's room for report, patient c/o of pain from new IV site in upper right arm. Patient insisted site be removed - patient educated on importance of having IV access for medical treatment. Patient still insisted it be removed by day RN. Patient without peripheral access at this time.  Patient had not talked to a provider today and wants to talk to a provider regarding swelling in extremities which he noted 2 days prior. Patient tearful and reports being "tired". Patient provided with emotional support. Called to inform [facility] on-call provider, Craige Cotta, of the situation. Craige Cotta stated that the patient has seen a [facility] hospitalist and gastroenterologist today. Craige Cotta stated that the patient has the right to refuse treatment and to simply make a note of the situation.  Will continue to educate the patient and assess patient's emotional needs.    2041: Discussed with patient what the [facility] provider had stated. Pt still insists that he had not been seen today. Continued to educate patient on the plan of care for the evening. Discussed the treatment plan for evening including the use of IV antibiotics. Patient stated he would think about getting another IV to receive antibiotics and would let this nurse know his decision.  2145: Patient has decided to receive antibiotics and will allow for an peripheral IV to be placed. IV team consulted. Will begin antibiotics as soon as patient has IV access and call pharmacy to address timings.

## 2019-06-03 NOTE — Progress Notes (Signed)
Pt called RN in room, tearful, upset about his peripheral IV hurting, wanted nurse to remove his IV. Complained that he hasn't seen the [facility] today. Pt is concern about swelling in this feet and hands and just doesn't feel good. Emotional support given to pt with oncoming nurse. Told pt that we will page MD to come assess him at the bedside.

## 2019-06-03 NOTE — Transfer of Care (Signed)
Immediate Anesthesia Transfer of Care Note  Patient: Caleb Chambers  Procedure(s) Performed: ESOPHAGOGASTRODUODENOSCOPY (EGD) with Necrosectomy (Bilateral ) ENDOROTOR (N/A ) STENT REMOVAL PANCREATIC STENT PLACEMENT  Patient Location: PACU  Anesthesia Type:General  Level of Consciousness: awake, alert  and oriented  Airway & Oxygen Therapy: Patient Spontanous Breathing  Post-op Assessment: Report given to RN and Post -op Vital signs reviewed and stable  Post vital signs: Reviewed and stable  Last Vitals:  Vitals Value Taken Time  BP    Temp    Pulse 107 06/03/19 1215  Resp 19 06/03/19 1215  SpO2 97 % 06/03/19 1215  Vitals shown include unvalidated device data.  Last Pain:  Vitals:   06/03/19 0920  TempSrc: Oral  PainSc: 6       Patients Stated Pain Goal: 2 (06/03/19 0725)  Complications: No apparent anesthesia complications

## 2019-06-03 NOTE — Op Note (Signed)
[facility] Patient Name: Caleb Chambers Procedure Date : 06/03/2019 MRN: [id number] Attending MD: Justice Britain , MD Date of Birth: Dec 05, 1965 CSN: [id number] Age: 54 Admit Type: Inpatient Procedure:                Upper GI endoscopy Indications:              Pancreatic necrosis Providers:                Justice Britain, MD, Carlyn Reichert, RN, Jeanella Cara, RN, Elspeth Cho Tech., Technician,                            Eligha Bridegroom CRNA, CRNA Referring MD:             Carol Ada, MD, [facility] Medicines:                General Anesthesia, No antibiotics administered as                            he is already scheduled as Inpatient Complications:            No immediate complications. Estimated Blood Loss:     Estimated blood loss was minimal. Procedure:                Pre-Anesthesia Assessment:                           - Prior to the procedure, a History and Physical                            was performed, and patient medications and                            allergies were reviewed. The patient's tolerance of                            previous anesthesia was also reviewed. The risks                            and benefits of the procedure and the sedation                            options and risks were discussed with the patient.                            All questions were answered, and informed consent                            was obtained. Prior Anticoagulants: The patient has                            taken no previous anticoagulant or antiplatelet  agents. ASA Grade Assessment: III - A patient with                            severe systemic disease. After reviewing the risks                            and benefits, the patient was deemed in                            satisfactory condition to undergo the procedure.                           After obtaining informed  consent, the endoscope was                            passed under direct vision. Throughout the                            procedure, the patient's blood pressure, pulse, and                            oxygen saturations were monitored continuously. The                            [id number] ([id number]) Olympus therapeutic                            gastroscope was introduced through the mouth, and                            advanced to the second part of duodenum. The upper                            GI endoscopy was accomplished without difficulty.                            The patient tolerated the procedure.                           Initial images not captured and so images that are                            in place are from the near completion of procedure. Scope In: Scope Out: Findings:      No gross lesions were noted in the proximal esophagus and in the mid       esophagus.      LA Grade B (one or more mucosal breaks greater than 5 mm, not extending       between the tops of two mucosal folds) esophagitis with no bleeding was       found in the distal esophagus.      A previously placed AXIOS cystgastrostomy stent with 2 double pigtails       was found on the posterior wall of the stomach. The two double pigtail       stents  were removed from the cystgastrostomy with a snare. The AXIOS       stent remained in place. The cyst was entered and was completely filled       with fluid and necrotic tissue that was pasty and adherent to the cyst       wall. Necrosectomy was performed for 85 minutes with a Endorotor Powered       Endoscopic Debrider, requiring multiple intubations of the cyst. At the       conclusion of the procedure, a smaller though still large amount of       necrotic tissue and a large amount of pink, viable tissue was found       within the pseudocyst on direct vision. There was visualization of what       may have been some sort of recent irritation within the  cyst cavity, but       no evidence of active bleeding was noted throughout. Two double pigtail       stents were placed (10 French x 4 cm & 10 French x 5 cm) across the       AXIOS cystgastrostomy tract.      Patchy moderately erythematous mucosa without bleeding was found in the       entire examined stomach.      No gross lesions were noted in the duodenal bulb, in the first portion       of the duodenum and in the second portion of the duodenum. Impression:               - No gross lesions in esophagus proximally. LA                            Grade B esophagitis with no bleeding distally.                           - Pre-existing AXIOS cystgastrostomy stent with                            double pigtails was found. Pigtails removed.                            Necrosectomy was performed. Double pigtails                            inserted.                           - Erythematous mucosa in the stomach.                           - No gross lesions in the duodenal bulb, in the                            first portion of the duodenum and in the second                            portion of the duodenum. Recommendation:           - The patient will be observed post-procedure,  until all discharge criteria are met.                           - Return patient to hospital ward for ongoing care.                           - Advance diet as tolerated.                           - Observe patient's clinical course.                           - Continue antibiotics as per primary service.                           - Will evaluate schedule and consider potential                            repeat procedure on 2/19 AM vs 2/22 AM - will be in                            touch with referring providers.                           - The big question moving forward will be whether,                            if this large walled off necrosis remains then may                             need to consider percutaneous drainage and flushing                            as a secondary approach as well.                           - The findings and recommendations were discussed                            with the patient.                           - The findings and recommendations were discussed                            with the patient's family.                           - The findings and recommendations were discussed                            with the referring physician. Procedure Code(s):        --- Professional ---  66599, Esophagogastroduodenoscopy, flexible,                            transoral; diagnostic, including collection of                            specimen(s) by brushing or washing, when performed                            (separate procedure)                           [phone], Unlisted procedure, pancreas Diagnosis Code(s):        --- Professional ---                           K20.90, Esophagitis, unspecified without bleeding                           Z97.8, Presence of other specified devices                           K31.89, Other diseases of stomach and duodenum                           K86.89, Other specified diseases of pancreas CPT copyright 2019 American [employer] Association. All rights reserved. The codes documented in this report are preliminary and upon coder review may  be revised to meet current compliance requirements. Justice Britain, MD 06/03/2019 12:15:07 PM Number of Addenda: 0

## 2019-06-03 NOTE — Anesthesia Procedure Notes (Signed)
Procedure Name: Intubation Date/Time: 06/03/2019 10:22 AM Performed by: Eligha Bridegroom, CRNA Pre-anesthesia Checklist: Patient identified, Emergency Drugs available, Suction available, Patient being monitored and Timeout performed Patient Re-evaluated:Patient Re-evaluated prior to induction Oxygen Delivery Method: Circle system utilized Preoxygenation: Pre-oxygenation with 100% oxygen Induction Type: IV induction Ventilation: Mask ventilation without difficulty and Oral airway inserted - appropriate to patient size Laryngoscope Size: Mac and 4 Grade View: Grade I Tube type: Oral Tube size: 7.5 mm Placement Confirmation: ETT inserted through vocal cords under direct vision,  positive ETCO2 and breath sounds checked- equal and bilateral Secured at: 22 cm Dental Injury: Teeth and Oropharynx as per pre-operative assessment

## 2019-06-03 NOTE — Anesthesia Postprocedure Evaluation (Signed)
Anesthesia Post Note  Patient: AASHISH HAMM  Procedure(s) Performed: ESOPHAGOGASTRODUODENOSCOPY (EGD) with Necrosectomy (Bilateral ) ENDOROTOR (N/A ) STENT REMOVAL PANCREATIC STENT PLACEMENT     Patient location during evaluation: PACU Anesthesia Type: General Level of consciousness: awake and alert Pain management: pain level controlled Vital Signs Assessment: post-procedure vital signs reviewed and stable Respiratory status: spontaneous breathing, nonlabored ventilation, respiratory function stable and patient connected to nasal cannula oxygen Cardiovascular status: blood pressure returned to baseline and stable Postop Assessment: no apparent nausea or vomiting Anesthetic complications: no    Last Vitals:  Vitals:   06/03/19 1220 06/03/19 1230  BP: 135/66 133/70  Pulse: (!) 106 (!) 105  Resp: (!) 22 (!) 23  Temp:    SpO2: 94% 100%    Last Pain:  Vitals:   06/03/19 1230  TempSrc:   PainSc: 0-No pain                 Shameeka Silliman,W. EDMOND

## 2019-06-03 NOTE — Progress Notes (Signed)
Physical Therapy Treatment Patient Details Name: Caleb Chambers MRN: [id number] DOB: 08-27-1965 Today's Date: 06/03/2019    History of Present Illness Pt is a 54 year old man admitted 05/12/19 with N/V, abdominal pain and DKA. Pt discharged from [facility] on 04/29/19 with DKA and pancreatitis. He has not been able to eat much since his discharge. PMH: morbid obesity, HTN, gout, Crohn's disease, IDDM.     PT Comments    Pt tolerated treatment well despite tachycardia and reported SOB. Pt ambulates well and mobilizes without physical assistance needs at this time. Pt provided education on the need for increased frequency of mobilization to improve activity tolerance. Pt has limited physical assistance at home and is progressing with PT intervention. Pt may progress to an appropriate level for discharge home, however he is currently unable to tolerate extended periods of mobility, increasing his falls risk. PT recommending SNF therapy at this time due to falls risk and limited activity tolerance.  Follow Up Recommendations  SNF;Supervision/Assistance - 24 hour(may progress to home with aggressive mobilization)     Equipment Recommendations  Rolling walker with 5" wheels    Recommendations for Other Services       Precautions / Restrictions Precautions Precautions: Fall Precaution Comments: watch HR Restrictions Weight Bearing Restrictions: No    Mobility  Bed Mobility Overal bed mobility: (pt received and left sitting at edge of bed)                Transfers Overall transfer level: Needs assistance Equipment used: Rolling walker (2 wheeled) Transfers: Sit to/from Stand Sit to Stand: Supervision            Ambulation/Gait Ambulation/Gait assistance: Supervision Gait Distance (Feet): 140 Feet Assistive device: Rolling walker (2 wheeled) Gait Pattern/deviations: Step-through pattern;Decreased stride length Gait velocity: decreased Gait velocity interpretation: 1.31 - 2.62  ft/sec, indicative of limited community ambulator General Gait Details: pt with reduced stride length and narrowed width of BOS during gait.   Stairs             Wheelchair Mobility    Modified Rankin (Stroke Patients Only)       Balance Overall balance assessment: Needs assistance Sitting-balance support: No upper extremity supported;Feet supported Sitting balance-Leahy Scale: Good Sitting balance - Comments: supervision to don socks   Standing balance support: Single extremity supported Standing balance-Leahy Scale: Good Standing balance comment: supervision                            Cognition Arousal/Alertness: Awake/alert Behavior During Therapy: WFL for tasks assessed/performed Overall Cognitive Status: Within Functional Limits for tasks assessed                                        Exercises      General Comments General comments (skin integrity, edema, etc.): pt tachy up to 146 during ambulation. Pt reports SOB during ambulation although recovers almost immediately with sitting edge of bed, and sats at 100%. PT provides education on the need for increased ambulation frequency and physical acitivity to improve cardiovascular tolerance for mobility. PT also educates on the need for LE elevation and LE exercise to reduce LE edema.      Pertinent Vitals/Pain Pain Assessment: Faces Faces Pain Scale: Hurts a little bit Pain Location: abdomen Pain Descriptors / Indicators: Constant;Discomfort Pain Intervention(s): Limited activity within patient's tolerance  Home Living                      Prior Function            PT Goals (current goals can now be found in the care plan section) Acute Rehab PT Goals Patient Stated Goal: feel better Progress towards PT goals: Progressing toward goals    Frequency    Min 3X/week      PT Plan Discharge plan needs to be updated    Co-evaluation              AM-PAC  PT "6 Clicks" Mobility   Outcome Measure  Help needed turning from your back to your side while in a flat bed without using bedrails?: A Little Help needed moving from lying on your back to sitting on the side of a flat bed without using bedrails?: A Little Help needed moving to and from a bed to a chair (including a wheelchair)?: A Little Help needed standing up from a chair using your arms (e.g., wheelchair or bedside chair)?: A Little Help needed to walk in hospital room?: A Little Help needed climbing 3-5 steps with a railing? : A Lot 6 Click Score: 17    End of Session   Activity Tolerance: Patient tolerated treatment well Patient left: in bed;with call bell/phone within reach Nurse Communication: Mobility status PT Visit Diagnosis: Unsteadiness on feet (R26.81);Muscle weakness (generalized) (M62.81);Difficulty in walking, not elsewhere classified (R26.2);Pain     Time: 4696-2952 PT Time Calculation (min) (ACUTE ONLY): 18 min  Charges:  $Gait Training: 8-22 mins                     Arlyss Gandy, PT, DPT Acute Rehabilitation Pager: [phone]    Arlyss Gandy 06/03/2019, 4:48 PM

## 2019-06-03 NOTE — Progress Notes (Signed)
Caleb Chambers  PROGRESS NOTE    Caleb Chambers  YDX:[id number] DOB: 04/13/1966 DOA: 05/12/2019 PCP: Patient, No Pcp Per   Brief Narrative:   54 year old African-American male, morbidly obese, with past medical history significant for hypertension,DM, gout; and recent admission for pancreatitis and DKA. Patient presented to the hospital with complaints of minimal oral intake since his discharge. Patient reported shortness of breath and decreased ability to eat and drink. He has not been fully compliant with insulin regimen as well. Patient was then admitted to the hospital again for DKA and acute kidney injury, prerenal. DKA and acute kidney injury have resolved. Patient is currently being managed for complications of severe pancreatitis.   Assessment & Plan:   Principal Problem:   DKA (diabetic ketoacidosis) ([place]) Active Problems:   Acute pancreatitis   Acute kidney injury ([place])   Morbid obesity ([name])   Debility   Protein-calorie malnutrition, severe  Severe pancreatitis with peripancreatic fluid collection Necrotizing pancreatitis     - questionable superimposed infection.       - Repeat CT abdomen/pelvis on 05/25/2019 showed persistent pancreatitis with new scattered peripancreatic gas, extensive ill-defined peripancreatic fluid extending throughout transverse colon into the root of mesentery.  S/p cholecystectomy.       - GI was consulted; apprecaite assistance, 2 necrocetomies so far; possible repeat on Friday w/ Dr. Rush Landmark.       - Drainage from pancreatic pseudocyst growing moderate staph aureus, started on vancomycin; continue for now ; continue flagyl for now  Recurrent DKA/diabetes mellitus type 2-resolved     - Hemoglobin A1c was 13.2 on 04/22/2019.       - change Lantusto 10 units SQ daily; continue SSI  History of hypertension     - BP is acceptabe, monitor.  Acute kidney injury     - resovled, monitor  Microcytic anemia     - no obvious bleeding     - lab work  suggestive of anemia of chronic disease in setting of inflammation.     - received 1u pRBCs after previous necrocetomy, but did not have good response; he's 7.4 today. Follow Hgb. Transfuse for Hgb less than 7.0.  Hyponatremia Hypomagnesemia     - Na+ is stable, monitor     - add Mg2+  DVT prophylaxis: lovenox Code Status: FULL Family Communication: None at bedside   Disposition Plan: Remains inpt d/t ongoing need for IV abx, further necrectomies.   Consultants:   GI  Antimicrobials:  Deniece Ree, flagyl   Subjective: No acute events ON  Objective: Vitals:   06/03/19 0920 06/03/19 1208 06/03/19 1220 06/03/19 1230  BP: (!) 157/77 139/65 135/66 133/70  Pulse: 93 (!) 106 (!) 106 (!) 105  Resp: (!) 28 (!) 21 (!) 22 (!) 23  Temp:  98 F (36.7 C)    TempSrc: Oral Oral    SpO2: 100% 95% 94% 100%  Weight: (!) 141.1 kg     Height: 5\' 11"  (1.803 m)       Intake/Output Summary (Last 24 hours) at 06/03/2019 1508 Last data filed at 06/03/2019 1430 Gross per 24 hour  Intake 1926.51 ml  Output 350 ml  Net 1576.51 ml   Filed Weights   06/02/19 0101 06/03/19 0348 06/03/19 0920  Weight: (!) 140 kg (!) 141.1 kg (!) 141.1 kg    Examination:  General: 54 y.o. male resting in bed in NAD Cardiovascular: RRR, +S1, S2, no m/g/r, equal pulses throughout Respiratory: decreased at bases, no w/r/r, normal WOB GI:  BS+, NDNT, no masses noted, no organomegaly noted MSK: No e/c/c Skin: No rashes, bruises, ulcerations noted Neuro: somnolent  Data Reviewed: I have personally reviewed following labs and imaging studies.  CBC: Recent Labs  Lab 05/28/19 0306 05/28/19 0306 05/29/19 0124 05/30/19 0234 06/01/19 0244 06/02/19 0231 06/03/19 0608  WBC 9.5   < > 7.2 6.0 11.7* 7.5 8.7  NEUTROABS 7.9*  --  4.4 3.6 9.7*  --   --   HGB 7.9*   < > 7.6* 7.7* 7.8* 7.1* 7.4*  HCT 25.6*   < > 24.6* 25.3* 25.8* 22.6* 23.8*  MCV 76.4*   < > 76.2* 77.1* 79.6* 78.5* 79.9*  PLT 341   < > 302 315 355 333  401*   < > = values in this interval not displayed.   Basic Metabolic Panel: Recent Labs  Lab 05/29/19 0124 05/30/19 0234 05/31/19 1410 06/01/19 0244 06/02/19 0231 06/03/19 0608  NA 136 133*  --  134* 134* 133*  K 3.9 4.1  --  4.0 3.9 3.5  CL 104 101  --  103 106 104  CO2 24 23  --  16* 19* 20*  GLUCOSE 77 104*  --  207* 280* 221*  BUN 8 9  --  8 11 8   CREATININE 0.78 0.83  --  0.87 0.96 0.77  CALCIUM 7.8* 7.7*  --  7.5* 7.4* 7.6*  MG  --   --  1.5*  --   --  1.4*  PHOS  --   --   --   --   --  2.5   GFR: Estimated Creatinine Clearance: 155.2 mL/min (by C-G formula based on SCr of 0.77 mg/dL). Liver Function Tests: Recent Labs  Lab 06/01/19 0244 06/02/19 0231  AST 18 14*  ALT 13 12  ALKPHOS 66 59  BILITOT 1.3* 0.9  PROT 6.9 6.2*  ALBUMIN 1.2* 1.3*   No results for input(s): LIPASE, AMYLASE in the last 168 hours. No results for input(s): AMMONIA in the last 168 hours. Coagulation Profile: Recent Labs  Lab 05/28/19 0306  INR 1.4*   Cardiac Enzymes: No results for input(s): CKTOTAL, CKMB, CKMBINDEX, TROPONINI in the last 168 hours. BNP (last 3 results) No results for input(s): PROBNP in the last 8760 hours. HbA1C: No results for input(s): HGBA1C in the last 72 hours. CBG: Recent Labs  Lab 06/02/19 1129 06/02/19 1652 06/02/19 2102 06/03/19 0609 06/03/19 0934  GLUCAP 221* 247* 228* 203* 167*   Lipid Profile: No results for input(s): CHOL, HDL, LDLCALC, TRIG, CHOLHDL, LDLDIRECT in the last 72 hours. Thyroid Function Tests: No results for input(s): TSH, T4TOTAL, FREET4, T3FREE, THYROIDAB in the last 72 hours. Anemia Panel: No results for input(s): VITAMINB12, FOLATE, FERRITIN, TIBC, IRON, RETICCTPCT in the last 72 hours. Sepsis Labs: No results for input(s): PROCALCITON, LATICACIDVEN in the last 168 hours.  Recent Results (from the past 240 hour(s))  C difficile quick scan w PCR reflex     Status: None   Collection Time: 05/25/19  8:15 AM   Specimen:  STOOL  Result Value Ref Range Status   C Diff antigen NEGATIVE NEGATIVE Final   C Diff toxin NEGATIVE NEGATIVE Final   C Diff interpretation No C. difficile detected.  Final    Comment: Performed at [facility], [address] [place], [place]  Aerobic/Anaerobic Culture (surgical/deep wound)     Status: None (Preliminary result)   Collection Time: 05/28/19  1:58 PM   Specimen: PATH GI  biopsy; Tissue  Result Value Ref Range Status   Specimen Description FLUID PANCREATIC PSUEDOCYST ASPIRATE  Final   Special Requests NONE  Final   Gram Stain   Final    ABUNDANT WBC PRESENT,BOTH PMN AND MONONUCLEAR ABUNDANT GRAM POSITIVE COCCI    Culture   Final    MODERATE STAPHYLOCOCCUS AUREUS FEW BACTEROIDES FRAGILIS BETA LACTAMASE POSITIVE Sent to [facility] for further susceptibility testing. FOR [facility] AUREUS Performed at [facility], [address], [place], [place] [address]    Report Status PENDING  Incomplete  Susceptibility, Aer + Anaerob     Status: None   Collection Time: 05/28/19  1:58 PM  Result Value Ref Range Status   Suscept, Aer + Anaerob PENDING  Incomplete   Source of Sample FLUID/ STAPH AUREUS  Corrected    Comment: Performed at [facility], [address], [place], [place] [address] CORRECTED ON 02/17 AT 1332: PREVIOUSLY REPORTED AS FLUID       Radiology Studies: No results found.   Scheduled Meds: . sodium chloride   Intravenous Once  . allopurinol  300 mg Oral Daily  . calcium carbonate  1 tablet Oral Daily  . chlorhexidine  15 mL Mouth Rinse BID  . diphenoxylate-atropine  2 tablet Oral BID  . enoxaparin (LOVENOX) injection  40 mg Subcutaneous Q24H  . feeding supplement (ENSURE ENLIVE)  237 mL Oral TID BM  . insulin aspart  0-20 Units Subcutaneous TID WC  . insulin aspart  0-5 Units Subcutaneous QHS  . insulin glargine  7 Units Subcutaneous Daily  . lipase/protease/amylase  36,000 Units Oral TID AC  . mouth rinse  15 mL Mouth Rinse  [id number]  . morphine  30 mg Oral Q12H  . multivitamin with minerals  1 tablet Oral Daily   Continuous Infusions: . metronidazole 500 mg (06/03/19 1412)  . vancomycin 1,750 mg (06/03/19 1410)     LOS: 22 days    Time spent: 25 minutes spent in the coordination of care today.    Teddy Spike, DO [facility]  If 7PM-7AM, please contact night-coverage [email or web address] 06/03/2019, 3:08 PM

## 2019-06-03 NOTE — Anesthesia Preprocedure Evaluation (Addendum)
Anesthesia Evaluation  Patient identified by MRN, date of birth, ID band Patient awake    Reviewed: Allergy & Precautions, H&P , NPO status , Patient's Chart, lab work & pertinent test results  Airway Mallampati: III  TM Distance: >3 FB Neck ROM: Full    Dental no notable dental hx. (+) Teeth Intact, Dental Advisory Given   Pulmonary neg pulmonary ROS,    Pulmonary exam normal breath sounds clear to auscultation       Cardiovascular hypertension, Pt. on medications  Rhythm:Regular Rate:Normal     Neuro/Psych negative neurological ROS  negative psych ROS   GI/Hepatic negative GI ROS, Neg liver ROS,   Endo/Other  diabetes, Insulin Dependent, Oral Hypoglycemic AgentsMorbid obesity  Renal/GU negative Renal ROS  negative genitourinary   Musculoskeletal   Abdominal   Peds  Hematology negative hematology ROS (+)   Anesthesia Other Findings   Reproductive/Obstetrics negative OB ROS                            Anesthesia Physical Anesthesia Plan  ASA: III  Anesthesia Plan: General   Post-op Pain Management:    Induction: Intravenous  PONV Risk Score and Plan: 1 and Ondansetron and Midazolam  Airway Management Planned: Oral ETT  Additional Equipment:   Intra-op Plan:   Post-operative Plan: Extubation in OR  Informed Consent: I have reviewed the patients History and Physical, chart, labs and discussed the procedure including the risks, benefits and alternatives for the proposed anesthesia with the patient or authorized representative who has indicated his/her understanding and acceptance.     Dental advisory given  Plan Discussed with: CRNA  Anesthesia Plan Comments:        Anesthesia Quick Evaluation

## 2019-06-04 ENCOUNTER — Inpatient Hospital Stay: Payer: Self-pay

## 2019-06-04 LAB — COMPREHENSIVE METABOLIC PANEL
ALT: 14 U/L (ref 0–44)
AST: 25 U/L (ref 15–41)
Albumin: 1.5 g/dL — ABNORMAL LOW (ref 3.5–5.0)
Alkaline Phosphatase: 68 U/L (ref 38–126)
Anion gap: 7 (ref 5–15)
BUN: 6 mg/dL (ref 6–20)
CO2: 22 mmol/L (ref 22–32)
Calcium: 7.5 mg/dL — ABNORMAL LOW (ref 8.9–10.3)
Chloride: 107 mmol/L (ref 98–111)
Creatinine, Ser: 0.86 mg/dL (ref 0.61–1.24)
GFR calc Af Amer: >60 mL/min (ref >60)
GFR calc non Af Amer: >60 mL/min (ref >60)
Glucose, Bld: 193 mg/dL — ABNORMAL HIGH (ref 70–99)
Potassium: 3.9 mmol/L (ref 3.5–5.1)
Sodium: 136 mmol/L (ref 135–145)
Total Bilirubin: 1.1 mg/dL (ref 0.3–1.2)
Total Protein: 5.9 g/dL — ABNORMAL LOW (ref 6.5–8.1)

## 2019-06-04 LAB — CBC WITH DIFFERENTIAL/PLATELET
Abs Immature Granulocytes: 0.13 10*3/uL — ABNORMAL HIGH (ref 0.00–0.07)
Basophils Absolute: 0 10*3/uL (ref 0.0–0.1)
Basophils Relative: 0 %
Eosinophils Absolute: 0 10*3/uL (ref 0.0–0.5)
Eosinophils Relative: 1 %
HCT: 22.5 % — ABNORMAL LOW (ref 39.0–52.0)
Hemoglobin: 6.8 g/dL — CL (ref 13.0–17.0)
Immature Granulocytes: 2 %
Lymphocytes Relative: 28 %
Lymphs Abs: 1.6 10*3/uL (ref 0.7–4.0)
MCH: 25.1 pg — ABNORMAL LOW (ref 26.0–34.0)
MCHC: 30.2 g/dL (ref 30.0–36.0)
MCV: 83 fL (ref 80.0–100.0)
Monocytes Absolute: 0.4 10*3/uL (ref 0.1–1.0)
Monocytes Relative: 7 %
Neutro Abs: 3.6 10*3/uL (ref 1.7–7.7)
Neutrophils Relative %: 62 %
Platelets: 140 10*3/uL — ABNORMAL LOW (ref 150–400)
RBC: 2.71 MIL/uL — ABNORMAL LOW (ref 4.22–5.81)
RDW: 18.6 % — ABNORMAL HIGH (ref 11.5–15.5)
WBC: 5.7 10*3/uL (ref 4.0–10.5)
nRBC: 2.8 % — ABNORMAL HIGH (ref 0.0–0.2)

## 2019-06-04 LAB — TYPE AND SCREEN
ABO/RH(D): O POS
Antibody Screen: NEGATIVE

## 2019-06-04 LAB — CBC
HCT: 28.2 % — ABNORMAL LOW (ref 39.0–52.0)
Hemoglobin: 8.8 g/dL — ABNORMAL LOW (ref 13.0–17.0)
MCH: 25.4 pg — ABNORMAL LOW (ref 26.0–34.0)
MCHC: 31.2 g/dL (ref 30.0–36.0)
MCV: 81.5 fL (ref 80.0–100.0)
Platelets: 407 10*3/uL — ABNORMAL HIGH (ref 150–400)
RBC: 3.46 MIL/uL — ABNORMAL LOW (ref 4.22–5.81)
RDW: 18.6 % — ABNORMAL HIGH (ref 11.5–15.5)
WBC: 8.5 10*3/uL (ref 4.0–10.5)
nRBC: 0.9 % — ABNORMAL HIGH (ref 0.0–0.2)

## 2019-06-04 LAB — MAGNESIUM: Magnesium: 1.8 mg/dL (ref 1.7–2.4)

## 2019-06-04 LAB — GLUCOSE, CAPILLARY
Glucose-Capillary: 158 mg/dL — ABNORMAL HIGH (ref 70–99)
Glucose-Capillary: 201 mg/dL — ABNORMAL HIGH (ref 70–99)
Glucose-Capillary: 163 mg/dL — ABNORMAL HIGH (ref 70–99)
Glucose-Capillary: 141 mg/dL — ABNORMAL HIGH (ref 70–99)

## 2019-06-04 LAB — PREPARE RBC (CROSSMATCH)

## 2019-06-04 MED ORDER — SODIUM CHLORIDE 0.9% IV SOLUTION: Freq: Once | INTRAVENOUS | Status: AC

## 2019-06-04 MED ORDER — BUMETANIDE 0.5 MG PO TABS
0.5000 mg | ORAL_TABLET | Freq: Every day | ORAL | Status: DC
  Filled 2019-06-04: qty 1

## 2019-06-04 MED ORDER — FUROSEMIDE 10 MG/ML IJ SOLN
20.0000 mg | Freq: Two times a day (BID) | INTRAMUSCULAR | Status: DC
  Administered 2019-06-04 – 2019-06-07 (×6): 20 mg via INTRAVENOUS
  Filled 2019-06-04 (×8): qty 2

## 2019-06-04 MED ORDER — MAGNESIUM SULFATE 2 GM/50ML IV SOLN
2.0000 g | Freq: Once | INTRAVENOUS | Status: AC
  Administered 2019-06-04: 18:00:00 2 g via INTRAVENOUS
  Filled 2019-06-04: qty 50

## 2019-06-04 MED ORDER — PANTOPRAZOLE SODIUM 40 MG PO TBEC
40.0000 mg | DELAYED_RELEASE_TABLET | Freq: Every day | ORAL | Status: DC
  Administered 2019-06-04 – 2019-06-22 (×15): 40 mg via ORAL
  Filled 2019-06-04 (×19): qty 1

## 2019-06-04 NOTE — Progress Notes (Signed)
Caleb Chambers  PROGRESS NOTE    [name]  DDU:[id number] DOB: 1965-05-12 DOA: 05/12/2019 PCP: Patient, No Pcp Per   Brief Narrative:   54 year old African-American male, morbidly obese, with past medical history significant for hypertension,DM, gout; and recent admission for pancreatitis and DKA. Patient presented to the hospital with complaints of minimal oral intake since his discharge. Patient reported shortness of breath and decreased ability to eat and drink. He has not been fully compliant with insulin regimen as well. Patient was then admitted to the hospital again for DKA and acute kidney injury, prerenal. DKA and acute kidney injury have resolved. Patient is currently being managed for complications of severe pancreatitis.  06/04/19: Hgb down. Receiving pRBCs this AM. Vitals look good. Afebrile ON. Lab work otherwise ok. May go back for necrectomy tomorrow. Can resume home bumex. Add TED hose. Let's get mobile.   Assessment & Plan:   Principal Problem:   DKA (diabetic ketoacidosis) (HCC) Active Problems:   Acute pancreatitis   Acute kidney injury (HCC)   Morbid obesity (HCC)   Debility   Protein-calorie malnutrition, severe  Severe pancreatitis with peripancreatic fluid collection Necrotizing pancreatitis     - questionable superimposed infection.       - Repeat CT abdomen/pelvis on 05/25/2019 showed persistent pancreatitis with new scattered peripancreatic gas, extensive ill-defined peripancreatic fluid extending throughout transverse colon into the root of mesentery.  S/p cholecystectomy.       - GI was consulted; apprecaite assistance, 2 necrocetomies so far; possible repeat on Friday w/ Dr. Meridee Score.       - Drainage from pancreatic pseudocyst growing moderate staph aureus, started on vancomycin; continue for now ; continue flagyl for now     - 06/04/19: Continue abx, he reports improvement in pain and overall disposition  Recurrent DKA/diabetes mellitus type 2-resolved     - Hemoglobin A1c was 13.2 on 04/22/2019.       - 06/04/19: Lantus 10 units SQ daily; continue SSI; looks ok for now, will continue this dosage  History of hypertension     - BP is acceptabe, monitor.  Acute kidney injury     - resovled, monitor     - 06/04/19: ok to resume home bumex  Microcytic anemia     - no obvious bleeding     - lab work suggestive of anemia of chronic disease in setting of inflammation.     - received 1u pRBCs after previous necrocetomy, but did not have good response; he's 7.4 today. Follow Hgb. Transfuse for Hgb less than 7.0.     - 06/04/19: receiving 1 u pRBCs today  Hyponatremia Hypomagnesemia     - Na+ is stable, monitor     - 06/04/19: Mg2+ is resolved, monitor  BLE edema     - resume home bumex, monitor renal function     - TED hose     - elevate legs when in chair/bed  Debility     - PT/OT  DVT prophylaxis: lovenox Code Status: FULL Family Communication:  Spoke with brother by phone. Disposition Plan: Remains inpatient d/t need for further procedures, IV abx.  Consultants:   GI  Antimicrobials:  . Flagyl, vanc   ROS:  Reports BLE swelling. Denies CP, ab pain, dyspnea, N, V . Remainder 10-pt ROS is negative for all not previously mentioned.  Subjective: "I take a water pill sometimes."  Objective: Vitals:   06/04/19 0303 06/04/19 0445 06/04/19 0515 06/04/19 0600  BP: (!) 120/59 125/67 122/66 130/75  Pulse: 90 95 91 90  Resp: 15 16 20 19   Temp: 97.6 F (36.4 C) 98.3 F (36.8 C) 97.6 F (36.4 C) 97.6 F (36.4 C)  TempSrc: Oral Oral Oral Oral  SpO2: 99% 98% 99% 100%  Weight:    (!) 141.6 kg  Height:        Intake/Output Summary (Last 24 hours) at 06/04/2019 1829 Last data filed at 06/04/2019 0600 Gross per 24 hour  Intake 2480.83 ml  Output 1150 ml  Net 1330.83 ml   Filed Weights   06/03/19 0348 06/03/19 0920 06/04/19 0600  Weight: (!) 141.1 kg (!) 141.1 kg (!) 141.6 kg    Examination:  General: 54 y.o. male  resting in chair in NAD Cardiovascular: RRR, +S1, S2, no m/g/r, equal pulses throughout Respiratory: CTABL, no w/r/r, normal WOB GI: BS+, NDNT, no masses noted, obese MSK: No c/c, BLE edema Neuro: A&O x 3, no focal deficits Psyc: Appropriate interaction and affect, calm/cooperative   Data Reviewed: I have personally reviewed following labs and imaging studies.  CBC: Recent Labs  Lab 05/29/19 0124 05/29/19 0124 05/30/19 0234 06/01/19 0244 06/02/19 0231 06/03/19 0608 06/04/19 0319  WBC 7.2   < > 6.0 11.7* 7.5 8.7 5.7  NEUTROABS 4.4  --  3.6 9.7*  --   --  3.6  HGB 7.6*   < > 7.7* 7.8* 7.1* 7.4* 6.8*  HCT 24.6*   < > 25.3* 25.8* 22.6* 23.8* 22.5*  MCV 76.2*   < > 77.1* 79.6* 78.5* 79.9* 83.0  PLT 302   < > 315 355 333 401* 140*   < > = values in this interval not displayed.   Basic Metabolic Panel: Recent Labs  Lab 05/30/19 0234 05/31/19 1410 06/01/19 0244 06/02/19 0231 06/03/19 0608 06/04/19 0319  NA 133*  --  134* 134* 133* 136  K 4.1  --  4.0 3.9 3.5 3.9  CL 101  --  103 106 104 107  CO2 23  --  16* 19* 20* 22  GLUCOSE 104*  --  207* 280* 221* 193*  BUN 9  --  8 11 8 6   CREATININE 0.83  --  0.87 0.96 0.77 0.86  CALCIUM 7.7*  --  7.5* 7.4* 7.6* 7.5*  MG  --  1.5*  --   --  1.4* 1.8  PHOS  --   --   --   --  2.5  --    GFR: Estimated Creatinine Clearance: 144.7 mL/min (by C-G formula based on SCr of 0.86 mg/dL). Liver Function Tests: Recent Labs  Lab 06/01/19 0244 06/02/19 0231 06/04/19 0319  AST 18 14* 25  ALT 13 12 14   ALKPHOS 66 59 68  BILITOT 1.3* 0.9 1.1  PROT 6.9 6.2* 5.9*  ALBUMIN 1.2* 1.3* 1.5*   No results for input(s): LIPASE, AMYLASE in the last 168 hours. No results for input(s): AMMONIA in the last 168 hours. Coagulation Profile: No results for input(s): INR, PROTIME in the last 168 hours. Cardiac Enzymes: No results for input(s): CKTOTAL, CKMB, CKMBINDEX, TROPONINI in the last 168 hours. BNP (last 3 results) No results for input(s):  PROBNP in the last 8760 hours. HbA1C: No results for input(s): HGBA1C in the last 72 hours. CBG: Recent Labs  Lab 06/03/19 0609 06/03/19 0934 06/03/19 1651 06/03/19 2130 06/04/19 0609  GLUCAP 203* 167* 253* 182* 158*   Lipid Profile: No results for input(s): CHOL, HDL, LDLCALC, TRIG, CHOLHDL, LDLDIRECT in the last 72 hours. Thyroid Function Tests: No  results for input(s): TSH, T4TOTAL, FREET4, T3FREE, THYROIDAB in the last 72 hours. Anemia Panel: No results for input(s): VITAMINB12, FOLATE, FERRITIN, TIBC, IRON, RETICCTPCT in the last 72 hours. Sepsis Labs: No results for input(s): PROCALCITON, LATICACIDVEN in the last 168 hours.  Recent Results (from the past 240 hour(s))  C difficile quick scan w PCR reflex     Status: None   Collection Time: 05/25/19  8:15 AM   Specimen: STOOL  Result Value Ref Range Status   C Diff antigen NEGATIVE NEGATIVE Final   C Diff toxin NEGATIVE NEGATIVE Final   C Diff interpretation No C. difficile detected.  Final    Comment: Performed at [facility], [address], [place], [place] [address]  Aerobic/Anaerobic Culture (surgical/deep wound)     Status: None (Preliminary result)   Collection Time: 05/28/19  1:58 PM   Specimen: PATH GI biopsy; Tissue  Result Value Ref Range Status   Specimen Description FLUID PANCREATIC PSUEDOCYST ASPIRATE  Final   Special Requests NONE  Final   Gram Stain   Final    ABUNDANT WBC PRESENT,BOTH PMN AND MONONUCLEAR ABUNDANT GRAM POSITIVE COCCI    Culture   Final    MODERATE STAPHYLOCOCCUS AUREUS FEW BACTEROIDES FRAGILIS BETA LACTAMASE POSITIVE Sent to [facility] for further susceptibility testing. FOR [facility] AUREUS Performed at [facility], [address], [place], [place] [address]    Report Status PENDING  Incomplete  Susceptibility, Aer + Anaerob     Status: Abnormal   Collection Time: 05/28/19  1:58 PM  Result Value Ref Range Status   Suscept, Aer + Anaerob Preliminary report (A)  Final     Comment: (NOTE) Performed At: [facility] [address] [place], [place] [id number] Jolene Schimke MD TR:[id number]    Source of Sample FLUID/ STAPH AUREUS  Corrected    Comment: Performed at [facility], [address] [place], [place] [address] CORRECTED ON 02/17 AT 1332: PREVIOUSLY REPORTED AS FLUID   Susceptibility Result     Status: Abnormal   Collection Time: 05/28/19  1:58 PM  Result Value Ref Range Status   Suscept Result 1 Staphylococcus aureus (A)  Final    Comment: (NOTE) Identification performed by account, not confirmed by this laboratory. Performed At: [facility] [address] [place], [place] [id number] Jolene Schimke MD BZ:[id number]       Radiology Studies: No results found.   Scheduled Meds: . sodium chloride   Intravenous Once  . allopurinol  300 mg Oral Daily  . calcium carbonate  1 tablet Oral Daily  . chlorhexidine  15 mL Mouth Rinse BID  . Chlorhexidine Gluconate Cloth  6 each Topical Daily  . diphenoxylate-atropine  2 tablet Oral BID  . enoxaparin (LOVENOX) injection  40 mg Subcutaneous Q24H  . feeding supplement (ENSURE ENLIVE)  237 mL Oral TID BM  . insulin aspart  0-20 Units Subcutaneous TID WC  . insulin aspart  0-5 Units Subcutaneous QHS  . insulin glargine  10 Units Subcutaneous Daily  . lipase/protease/amylase  36,000 Units Oral TID AC  . mouth rinse  15 mL Mouth Rinse q12n4p  . morphine  30 mg Oral Q12H  . multivitamin with minerals  1 tablet Oral Daily   Continuous Infusions: . metronidazole Stopped (06/04/19 0050)  . vancomycin 1,750 mg (06/04/19 0102)     LOS: 23 days    Time spent: 25 minutes spent in the coordination of care today.    Maliki Gignac A  Ronaldo Miyamoto, DO [facility]  If 7PM-7AM, please contact night-coverage [email or web address] 06/04/2019, 7:13 AM

## 2019-06-04 NOTE — Progress Notes (Signed)
Subjective: Complaining about lower extremity edema and GERD.  Objective: Vital signs in last 24 hours: Temp:  [97.3 F (36.3 C)-98.3 F (36.8 C)] 97.8 F (36.6 C) (02/18 1104) Pulse Rate:  [90-105] 93 (02/18 1200) Resp:  [15-20] 19 (02/18 1104) BP: (112-145)/(59-83) 129/71 (02/18 1104) SpO2:  [98 %-100 %] 100 % (02/18 1200) Weight:  [141.6 kg] 141.6 kg (02/18 0600) Last BM Date: 06/04/19  Intake/Output from previous day: 02/17 0701 - 02/18 0700 In: 2480.8 [P.O.:600; I.V.:612.5; IV Piggyback:1268.3] Out: 1150 [Urine:1150] Intake/Output this shift: Total I/O In: 550 [P.O.:100; I.V.:50; Blood:300; IV Piggyback:100] Out: 300 [Urine:300]  General appearance: alert and no distress Resp: clear to auscultation bilaterally Cardio: regular rate and rhythm GI: soft, non-tender; bowel sounds normal; no masses,  no organomegaly Extremities: 4+ edema  Lab Results: Recent Labs    06/03/19 0608 06/04/19 0319 06/04/19 1245  WBC 8.7 5.7 8.5  HGB 7.4* 6.8* 8.8*  HCT 23.8* 22.5* 28.2*  PLT 401* 140* 407*   BMET Recent Labs    06/02/19 0231 06/03/19 0608 06/04/19 0319  NA 134* 133* 136  K 3.9 3.5 3.9  CL 106 104 107  CO2 19* 20* 22  GLUCOSE 280* 221* 193*  BUN 11 8 6   CREATININE 0.96 0.77 0.86  CALCIUM 7.4* 7.6* 7.5*   LFT Recent Labs    06/04/19 0319  PROT 5.9*  ALBUMIN 1.5*  AST 25  ALT 14  ALKPHOS 68  BILITOT 1.1   PT/INR No results for input(s): LABPROT, INR in the last 72 hours. Hepatitis Panel No results for input(s): HEPBSAG, HCVAB, HEPAIGM, HEPBIGM in the last 72 hours. C-Diff No results for input(s): CDIFFTOX in the last 72 hours. Fecal Lactopherrin No results for input(s): FECLLACTOFRN in the last 72 hours.  Studies/Results: No results found.  Medications:  Scheduled: . sodium chloride   Intravenous Once  . allopurinol  300 mg Oral Daily  . bumetanide  0.5 mg Oral Daily  . calcium carbonate  1 tablet Oral Daily  . chlorhexidine  15 mL Mouth  Rinse BID  . Chlorhexidine Gluconate Cloth  6 each Topical Daily  . diphenoxylate-atropine  2 tablet Oral BID  . enoxaparin (LOVENOX) injection  40 mg Subcutaneous Q24H  . feeding supplement (ENSURE ENLIVE)  237 mL Oral TID BM  . furosemide  20 mg Intravenous BID  . insulin aspart  0-20 Units Subcutaneous TID WC  . insulin aspart  0-5 Units Subcutaneous QHS  . insulin glargine  10 Units Subcutaneous Daily  . lipase/protease/amylase  36,000 Units Oral TID AC  . mouth rinse  15 mL Mouth Rinse q12n4p  . morphine  30 mg Oral Q12H  . multivitamin with minerals  1 tablet Oral Daily  . pantoprazole  40 mg Oral Daily   Continuous: . magnesium sulfate bolus IVPB    . metronidazole 500 mg (06/04/19 0807)  . vancomycin 1,750 mg (06/04/19 0102)    Assessment/Plan: 1) Infected pancreatic pseudocyst/WON. 2) Severe malnutrition. 3) LE edema. 4) Esophagitis.     The patient's PO intake is not meeting his caloric requirements.  He is agreeable to starting on TPN.  He will maintain his current regular diet and pantoprazole will be started.  This will help to improve his PO intake.  As for his LE edema, he will be treated with lasix, which will also help as his fluid intake will be increased with the TPN.  Plan: 1) Necrosectomy per Dr. 06/06/19. 2) PICC line for TPN. 3) Pharmacy consult  for TPN. 4) Pantoprazole.  LOS: 23 days   Caleb Chambers D 06/04/2019, 1:16 PM

## 2019-06-04 NOTE — H&P (View-Only) (Signed)
Subjective: Complaining about lower extremity edema and GERD.  Objective: Vital signs in last 24 hours: Temp:  [97.3 F (36.3 C)-98.3 F (36.8 C)] 97.8 F (36.6 C) (02/18 1104) Pulse Rate:  [90-105] 93 (02/18 1200) Resp:  [15-20] 19 (02/18 1104) BP: (112-145)/(59-83) 129/71 (02/18 1104) SpO2:  [98 %-100 %] 100 % (02/18 1200) Weight:  [141.6 kg] 141.6 kg (02/18 0600) Last BM Date: 06/04/19  Intake/Output from previous day: 02/17 0701 - 02/18 0700 In: 2480.8 [P.O.:600; I.V.:612.5; IV Piggyback:1268.3] Out: 1150 [Urine:1150] Intake/Output this shift: Total I/O In: 550 [P.O.:100; I.V.:50; Blood:300; IV Piggyback:100] Out: 300 [Urine:300]  General appearance: alert and no distress Resp: clear to auscultation bilaterally Cardio: regular rate and rhythm GI: soft, non-tender; bowel sounds normal; no masses,  no organomegaly Extremities: 4+ edema  Lab Results: Recent Labs    06/03/19 0608 06/04/19 0319 06/04/19 1245  WBC 8.7 5.7 8.5  HGB 7.4* 6.8* 8.8*  HCT 23.8* 22.5* 28.2*  PLT 401* 140* 407*   BMET Recent Labs    06/02/19 0231 06/03/19 0608 06/04/19 0319  NA 134* 133* 136  K 3.9 3.5 3.9  CL 106 104 107  CO2 19* 20* 22  GLUCOSE 280* 221* 193*  BUN 11 8 6   CREATININE 0.96 0.77 0.86  CALCIUM 7.4* 7.6* 7.5*   LFT Recent Labs    06/04/19 0319  PROT 5.9*  ALBUMIN 1.5*  AST 25  ALT 14  ALKPHOS 68  BILITOT 1.1   PT/INR No results for input(s): LABPROT, INR in the last 72 hours. Hepatitis Panel No results for input(s): HEPBSAG, HCVAB, HEPAIGM, HEPBIGM in the last 72 hours. C-Diff No results for input(s): CDIFFTOX in the last 72 hours. Fecal Lactopherrin No results for input(s): FECLLACTOFRN in the last 72 hours.  Studies/Results: No results found.  Medications:  Scheduled: . sodium chloride   Intravenous Once  . allopurinol  300 mg Oral Daily  . bumetanide  0.5 mg Oral Daily  . calcium carbonate  1 tablet Oral Daily  . chlorhexidine  15 mL Mouth  Rinse BID  . Chlorhexidine Gluconate Cloth  6 each Topical Daily  . diphenoxylate-atropine  2 tablet Oral BID  . enoxaparin (LOVENOX) injection  40 mg Subcutaneous Q24H  . feeding supplement (ENSURE ENLIVE)  237 mL Oral TID BM  . furosemide  20 mg Intravenous BID  . insulin aspart  0-20 Units Subcutaneous TID WC  . insulin aspart  0-5 Units Subcutaneous QHS  . insulin glargine  10 Units Subcutaneous Daily  . lipase/protease/amylase  36,000 Units Oral TID AC  . mouth rinse  15 mL Mouth Rinse q12n4p  . morphine  30 mg Oral Q12H  . multivitamin with minerals  1 tablet Oral Daily  . pantoprazole  40 mg Oral Daily   Continuous: . magnesium sulfate bolus IVPB    . metronidazole 500 mg (06/04/19 0807)  . vancomycin 1,750 mg (06/04/19 0102)    Assessment/Plan: 1) Infected pancreatic pseudocyst/WON. 2) Severe malnutrition. 3) LE edema. 4) Esophagitis.     The patient's PO intake is not meeting his caloric requirements.  He is agreeable to starting on TPN.  He will maintain his current regular diet and pantoprazole will be started.  This will help to improve his PO intake.  As for his LE edema, he will be treated with lasix, which will also help as his fluid intake will be increased with the TPN.  Plan: 1) Necrosectomy per Dr. 06/06/19. 2) PICC line for TPN. 3) Pharmacy consult  for TPN. 4) Pantoprazole.  LOS: 23 days   Greysin Medlen D 06/04/2019, 1:16 PM 

## 2019-06-04 NOTE — Anesthesia Preprocedure Evaluation (Signed)
Anesthesia Evaluation  Patient identified by MRN, date of birth, ID band Patient awake    Reviewed: Allergy & Precautions, H&P , NPO status , Patient's Chart, lab work & pertinent test results  Airway Mallampati: III  TM Distance: >3 FB Neck ROM: Full    Dental no notable dental hx. (+) Teeth Intact, Dental Advisory Given   Pulmonary neg pulmonary ROS,    Pulmonary exam normal breath sounds clear to auscultation       Cardiovascular hypertension, Pt. on medications  Rhythm:Regular Rate:Normal     Neuro/Psych negative neurological ROS  negative psych ROS   GI/Hepatic negative GI ROS, Neg liver ROS,   Endo/Other  diabetes, Insulin Dependent, Oral Hypoglycemic AgentsMorbid obesity  Renal/GU negative Renal ROS  negative genitourinary   Musculoskeletal   Abdominal   Peds  Hematology negative hematology ROS (+)   Anesthesia Other Findings   Reproductive/Obstetrics negative OB ROS                             Lab Results  Component Value Date   WBC 8.5 06/04/2019   HGB 8.8 (L) 06/04/2019   HCT 28.2 (L) 06/04/2019   MCV 81.5 06/04/2019   PLT 407 (H) 06/04/2019   Lab Results  Component Value Date   CREATININE 0.86 06/04/2019   BUN 6 06/04/2019   NA 136 06/04/2019   K 3.9 06/04/2019   CL 107 06/04/2019   CO2 22 06/04/2019    Anesthesia Physical  Anesthesia Plan  ASA: III  Anesthesia Plan: General   Post-op Pain Management:    Induction: Intravenous  PONV Risk Score and Plan: 1 and Ondansetron and Midazolam  Airway Management Planned: Oral ETT  Additional Equipment:   Intra-op Plan:   Post-operative Plan: Extubation in OR  Informed Consent: I have reviewed the patients History and Physical, chart, labs and discussed the procedure including the risks, benefits and alternatives for the proposed anesthesia with the patient or authorized representative who has indicated  his/her understanding and acceptance.     Dental advisory given  Plan Discussed with: CRNA  Anesthesia Plan Comments:         Anesthesia Quick Evaluation

## 2019-06-04 NOTE — Progress Notes (Signed)
Patient receiving 1 unit of PRBC this morning for hemoglobin of 6.8. Dual sign-off completed with Rosann Auerbach, RN.  Patient vital signs stable with no signs of acute reaction at this time. Patient educated on s/s of reaction to be aware of. Patient verbalized understanding of education.  Continuing to monitor patient through frequent vital sign checks and verbal contacts.

## 2019-06-04 NOTE — Progress Notes (Addendum)
PHARMACY - TOTAL PARENTERAL NUTRITION CONSULT NOTE  Indication:  Infected pancreatic pseudocyst  Patient Measurements: Height: 5\' 11"  (180.3 cm) Weight: (!) 312 lb 1.6 oz (141.6 kg) IBW/kg (Calculated) : 75.3 TPN AdjBW (KG): 91.7 Body mass index is 43.53 kg/m.  Assessment:  18 YOM presented on 05/12/19 with DKA.  Patient was recently discharged after treatment for DKA and acute pancreatitis.  CT showed worsening pancreatitis and the development of a fluid collection.  Patient was started on tube feeding on 1/28 and tolerated it well through 2/10.  A clear liquid diet was initiated on 05/22/19 but intake was minimal, and was off and on a clear diet until 2/15.  EGD on 2/11 showed esophagitis/duodenitis and patient underwent pancreatic and biliary stent placements on 2/12.  He is s/p necrosectomy for pancreatic pseudocyst/necrosis on 2/15 and again on 2/17.  Feeding tube placement delayed due to frequent procedures.  Patient was advanced to a cardiac diet on 2/15 and intake has been inadequate.  In setting of severe malutrition, Pharmacy consulted to initiate TPN.  Glucose / Insulin: hx IDDM admitted with DKA - A1c 13.2%.  CBGs currently improving (158-253) on rSSI + Lantus 10/d *required Lantus 45/20 BID and significant amount of SSI while on TF/oral diet  Electrolytes: all WNL (Mag 1.8 and 2gm ordered) Renal: SCr 0.86, BUN WNL LFTs / TGs: LFTs / tbili WNL Prealbumin / albumin: albumin low at 1.5 Intake / Output; MIVF: UOP 0.3 ml/kg/hr, net+8.3L requiring diuresis prior to TPN GI Imaging: none since TPN consult Surgeries / Procedures: none since TPN consult  Central access: PICC to be placed TPN start date: 06/05/19  Nutritional Goals (per RD rec on 2/18): 2300-2500 kCal, 130-145g protein, >2L fluid per day  Current Nutrition:  HH/CMD:  consumed 5-30% of meals Ensure TID  Plan:   Start concentrated TPN on 2/19 as consult was received after hour Continue daily PO multivitamin Continue  rSSI TIDWM + HS coverage and Lantus 10 units daily Standard TPN lab and nursing care orders  Gay Moncivais D. 05-23-1981, PharmD, BCPS, BCCCP 06/04/2019, 1:29 PM

## 2019-06-04 NOTE — Progress Notes (Signed)
Nutrition Follow up   DOCUMENTATION CODES:   Severe malnutrition in context of acute illness/injury  INTERVENTION:   Patient to have recurring endoscopies. If PO intake remains inadequate, recommend initiation of TPN until enteral access can be obtained   Ensure Enlive po TID, each supplement provides 350 kcal and 20 grams of protein  Magic cup TID with meals, each supplement provides 290 kcal and 9 grams of protein  MVI daily   NUTRITION DIAGNOSIS:   Severe Malnutrition related to acute illness(pancreatitis) as evidenced by energy intake < or equal to 50% for > or equal to 5 days, moderate fat depletion, mild fat depletion, moderate muscle depletion, mild muscle depletion.  Ongoing  GOAL:   Patient will meet greater than or equal to 90% of their needs  Not meeting  MONITOR:   TF tolerance, Weight trends, Labs, I & O's  REASON FOR ASSESSMENT:   Consult Assessment of nutrition requirement/status  ASSESSMENT:   Pt with a PMH significant for HTN, DM, gout, and recent admission for pancreatitis and DKA presented to ED with DKA, abdominal pain, SOB, and minimal PO intake since last admission.   1/28- s/p post pyloric small bore feeding tube placement (reads at the LOT) 2/11- s/p endoscopic cyst drainage, Cortrak removed during procedure  2/15- s/p endoscopic necrosectomy  2/17- s/p endoscopic necrosectomy   Per RN, pt not eating well. Mainly eating fruit and drinking water. Will consume sips of supplements. Meal completions charted as 0-10% for his last eight meals (6% average).   Spoke with GI. Unable to replace Cortrak as pt to undergo recurrent endoscopic necrosectomies. Would recommend TPN for the time being until enteral access can be obtained or PO intake progresses. Severe malnutrition continues.   Admission weight: 133.3 kg  Current weight: 14.16 kg   I/O: +8,411 ml since 2/4 UOP: 1,150 ml x 24 hrs   Medications: SS novolog, lantus, lomotil, creon, MVI with  minerals  Labs: Na 134 (L) CBG 132-212  Diet Order:   Diet Order            Diet heart healthy/carb modified Room service appropriate? Yes; Fluid consistency: Thin  Diet effective now              EDUCATION NEEDS:   Not appropriate for education at this time  Skin:  Skin Assessment: Reviewed RN Assessment  Last BM:  2/17  Height:   Ht Readings from Last 1 Encounters:  06/03/19 5\' 11"  (1.803 m)    Weight:   Wt Readings from Last 1 Encounters:  06/04/19 (!) 141.6 kg    Ideal Body Weight:  80.9 kg  BMI:  Body mass index is 43.53 kg/m.  Estimated Nutritional Needs:   Kcal:  2300-2500  Protein:  130-145 grams  Fluid:  >2L/d  06/06/19 RD, LDN Clinical Nutrition Pager # [phone]

## 2019-06-04 NOTE — Progress Notes (Signed)
CRITICAL VALUE ALERT  Critical Value:  Hemoglobin: 6.8  Date & Time Notified:  06/04/19 at 0348  Provider Notified: Craige Cotta at [phone]  Orders Received/Actions taken: Awaiting orders - preparing  for likely blood administration

## 2019-06-05 ENCOUNTER — Inpatient Hospital Stay (HOSPITAL_COMMUNITY): Payer: BC Managed Care – PPO | Admitting: Anesthesiology

## 2019-06-05 ENCOUNTER — Encounter (HOSPITAL_COMMUNITY)
Admission: EM | Disposition: A | Discharge status: Home Health | Payer: Self-pay | Source: Home / Self Care | Attending: Internal Medicine

## 2019-06-05 ENCOUNTER — Encounter (HOSPITAL_COMMUNITY): Payer: Self-pay | Admitting: Internal Medicine

## 2019-06-05 ENCOUNTER — Inpatient Hospital Stay (HOSPITAL_COMMUNITY): Payer: BC Managed Care – PPO

## 2019-06-05 DIAGNOSIS — K3189 Other diseases of stomach and duodenum: Secondary | ICD-10-CM

## 2019-06-05 DIAGNOSIS — K8689 Other specified diseases of pancreas: Secondary | ICD-10-CM

## 2019-06-05 HISTORY — PX: PANCREATIC STENT PLACEMENT: SHX5539

## 2019-06-05 HISTORY — PX: STENT REMOVAL: SHX6421

## 2019-06-05 HISTORY — PX: ESOPHAGOGASTRODUODENOSCOPY: SHX5428

## 2019-06-05 HISTORY — PX: ENDOROTOR: SHX6859

## 2019-06-05 LAB — TYPE AND SCREEN
ABO/RH(D): O POS
Antibody Screen: NEGATIVE
Unit division: 0
Unit division: 0

## 2019-06-05 LAB — CBC WITH DIFFERENTIAL/PLATELET
Abs Immature Granulocytes: 0.06 10*3/uL (ref 0.00–0.07)
Basophils Absolute: 0 10*3/uL (ref 0.0–0.1)
Basophils Relative: 1 %
Eosinophils Absolute: 0 10*3/uL (ref 0.0–0.5)
Eosinophils Relative: 1 %
HCT: 25.9 % — ABNORMAL LOW (ref 39.0–52.0)
Hemoglobin: 8 g/dL — ABNORMAL LOW (ref 13.0–17.0)
Immature Granulocytes: 1 %
Lymphocytes Relative: 24 %
Lymphs Abs: 1.5 10*3/uL (ref 0.7–4.0)
MCH: 25.4 pg — ABNORMAL LOW (ref 26.0–34.0)
MCHC: 30.9 g/dL (ref 30.0–36.0)
MCV: 82.2 fL (ref 80.0–100.0)
Monocytes Absolute: 0.4 10*3/uL (ref 0.1–1.0)
Monocytes Relative: 7 %
Neutro Abs: 4.3 10*3/uL (ref 1.7–7.7)
Neutrophils Relative %: 66 %
Platelets: 387 10*3/uL (ref 150–400)
RBC: 3.15 MIL/uL — ABNORMAL LOW (ref 4.22–5.81)
RDW: 19.6 % — ABNORMAL HIGH (ref 11.5–15.5)
WBC: 6.3 10*3/uL (ref 4.0–10.5)
nRBC: 0.9 % — ABNORMAL HIGH (ref 0.0–0.2)

## 2019-06-05 LAB — PHOSPHORUS: Phosphorus: 3.7 mg/dL (ref 2.5–4.6)

## 2019-06-05 LAB — GLUCOSE, CAPILLARY
Glucose-Capillary: 153 mg/dL — ABNORMAL HIGH (ref 70–99)
Glucose-Capillary: 130 mg/dL — ABNORMAL HIGH (ref 70–99)
Glucose-Capillary: 136 mg/dL — ABNORMAL HIGH (ref 70–99)
Glucose-Capillary: 171 mg/dL — ABNORMAL HIGH (ref 70–99)
Glucose-Capillary: 193 mg/dL — ABNORMAL HIGH (ref 70–99)

## 2019-06-05 LAB — COMPREHENSIVE METABOLIC PANEL
ALT: 13 U/L (ref 0–44)
AST: 13 U/L — ABNORMAL LOW (ref 15–41)
Albumin: 1.4 g/dL — ABNORMAL LOW (ref 3.5–5.0)
Alkaline Phosphatase: 59 U/L (ref 38–126)
Anion gap: 9 (ref 5–15)
BUN: <5 mg/dL — ABNORMAL LOW (ref 6–20)
CO2: 23 mmol/L (ref 22–32)
Calcium: 7.8 mg/dL — ABNORMAL LOW (ref 8.9–10.3)
Chloride: 108 mmol/L (ref 98–111)
Creatinine, Ser: 0.7 mg/dL (ref 0.61–1.24)
GFR calc Af Amer: >60 mL/min (ref >60)
GFR calc non Af Amer: >60 mL/min (ref >60)
Glucose, Bld: 165 mg/dL — ABNORMAL HIGH (ref 70–99)
Potassium: 3.2 mmol/L — ABNORMAL LOW (ref 3.5–5.1)
Sodium: 140 mmol/L (ref 135–145)
Total Bilirubin: 0.7 mg/dL (ref 0.3–1.2)
Total Protein: 6.3 g/dL — ABNORMAL LOW (ref 6.5–8.1)

## 2019-06-05 LAB — BPAM RBC
Blood Product Expiration Date: 202103162359
Blood Product Expiration Date: 202103192359
ISSUE DATE / TIME: 202102151813
ISSUE DATE / TIME: 202102180457
Unit Type and Rh: 5100
Unit Type and Rh: 5100

## 2019-06-05 LAB — PREALBUMIN: Prealbumin: 9.5 mg/dL — ABNORMAL LOW (ref 18–38)

## 2019-06-05 LAB — VANCOMYCIN, TROUGH: Vancomycin Tr: 18 ug/mL (ref 15–20)

## 2019-06-05 LAB — MAGNESIUM: Magnesium: 1.7 mg/dL (ref 1.7–2.4)

## 2019-06-05 LAB — VANCOMYCIN, PEAK: Vancomycin Pk: 40 ug/mL (ref 30–40)

## 2019-06-05 LAB — TRIGLYCERIDES: Triglycerides: 75 mg/dL (ref <150)

## 2019-06-05 SURGERY — EGD (ESOPHAGOGASTRODUODENOSCOPY)
Anesthesia: General

## 2019-06-05 MED ORDER — PHENYLEPHRINE HCL-NACL 10-0.9 MG/250ML-% IV SOLN
INTRAVENOUS | Status: DC | PRN
  Administered 2019-06-05: 50 ug/min via INTRAVENOUS

## 2019-06-05 MED ORDER — SODIUM CHLORIDE 0.9 % IV SOLN: INTRAVENOUS | Status: DC

## 2019-06-05 MED ORDER — SUGAMMADEX SODIUM 200 MG/2ML IV SOLN
INTRAVENOUS | Status: DC | PRN
  Administered 2019-06-05: 300 mg via INTRAVENOUS

## 2019-06-05 MED ORDER — STERILE WATER FOR INJECTION IV SOLN
INTRAVENOUS | Status: AC
  Filled 2019-06-05: qty 414.4

## 2019-06-05 MED ORDER — LIDOCAINE 2% (20 MG/ML) 5 ML SYRINGE
INTRAMUSCULAR | Status: DC | PRN
  Administered 2019-06-05: 60 mg via INTRAVENOUS

## 2019-06-05 MED ORDER — PROPOFOL 10 MG/ML IV BOLUS
INTRAVENOUS | Status: DC | PRN
  Administered 2019-06-05: 150 mg via INTRAVENOUS

## 2019-06-05 MED ORDER — HEPARIN SOD (PORK) LOCK FLUSH 100 UNIT/ML IV SOLN
INTRAVENOUS | Status: AC
  Filled 2019-06-05: qty 5

## 2019-06-05 MED ORDER — MIDAZOLAM HCL 2 MG/2ML IJ SOLN
INTRAMUSCULAR | Status: DC | PRN
  Administered 2019-06-05: 1 mg via INTRAVENOUS

## 2019-06-05 MED ORDER — LIDOCAINE HCL 1 % IJ SOLN
INTRAMUSCULAR | Status: AC
  Filled 2019-06-05: qty 20

## 2019-06-05 MED ORDER — ROCURONIUM BROMIDE 10 MG/ML (PF) SYRINGE
PREFILLED_SYRINGE | INTRAVENOUS | Status: DC | PRN
  Administered 2019-06-05: 30 mg via INTRAVENOUS

## 2019-06-05 MED ORDER — MAGNESIUM SULFATE 50 % IJ SOLN
3.0000 g | Freq: Once | INTRAVENOUS | Status: AC
  Administered 2019-06-05: 3 g via INTRAVENOUS
  Filled 2019-06-05 (×2): qty 6

## 2019-06-05 MED ORDER — SUCCINYLCHOLINE CHLORIDE 200 MG/10ML IV SOSY
PREFILLED_SYRINGE | INTRAVENOUS | Status: DC | PRN
  Administered 2019-06-05: 100 mg via INTRAVENOUS

## 2019-06-05 MED ORDER — LIDOCAINE-EPINEPHRINE (PF) 1 %-1:200000 IJ SOLN
INTRAMUSCULAR | Status: DC | PRN
  Administered 2019-06-05: 5 mL

## 2019-06-05 MED ORDER — VANCOMYCIN HCL 1250 MG/250ML IV SOLN
1250.0000 mg | Freq: Two times a day (BID) | INTRAVENOUS | Status: DC
  Administered 2019-06-05 – 2019-06-09 (×7): 1250 mg via INTRAVENOUS
  Filled 2019-06-05 (×8): qty 250

## 2019-06-05 MED ORDER — POTASSIUM CHLORIDE 10 MEQ/100ML IV SOLN
10.0000 meq | INTRAVENOUS | Status: DC
  Administered 2019-06-05: 11:00:00 10 meq via INTRAVENOUS
  Filled 2019-06-05 (×6): qty 100

## 2019-06-05 MED ORDER — EPHEDRINE SULFATE-NACL 50-0.9 MG/10ML-% IV SOSY
PREFILLED_SYRINGE | INTRAVENOUS | Status: DC | PRN
  Administered 2019-06-05: 10 mg via INTRAVENOUS

## 2019-06-05 MED ORDER — LACTATED RINGERS IV SOLN: INTRAVENOUS | Status: DC

## 2019-06-05 MED ORDER — POTASSIUM CHLORIDE 10 MEQ/100ML IV SOLN
10.0000 meq | INTRAVENOUS | Status: AC
  Administered 2019-06-05 (×4): 10 meq via INTRAVENOUS
  Filled 2019-06-05 (×4): qty 100

## 2019-06-05 MED ORDER — PHENYLEPHRINE 40 MCG/ML (10ML) SYRINGE FOR IV PUSH (FOR BLOOD PRESSURE SUPPORT)
PREFILLED_SYRINGE | INTRAVENOUS | Status: DC | PRN
  Administered 2019-06-05 (×3): 160 ug via INTRAVENOUS

## 2019-06-05 MED ORDER — FENTANYL CITRATE (PF) 250 MCG/5ML IJ SOLN
INTRAMUSCULAR | Status: DC | PRN
  Administered 2019-06-05 (×2): 50 ug via INTRAVENOUS

## 2019-06-05 NOTE — Progress Notes (Signed)
[name]  PROGRESS NOTE    Caleb Chambers  GXQ:[id number] DOB: [birth year] DOA: 05/12/2019 PCP: Patient, No Pcp Per   Brief Narrative:   54 year old African-American male, morbidly obese, with past medical history significant for hypertension,DM, gout; and recent admission for pancreatitis and DKA. Patient presented to the hospital with complaints of minimal oral intake since his discharge. Patient reported shortness of breath and decreased ability to eat and drink. He has not been fully compliant with insulin regimen as well. Patient was then admitted to the hospital again for DKA and acute kidney injury, prerenal. DKA and acute kidney injury have resolved. Patient is currently being managed for complications of severe pancreatitis.  06/05/19: S/p necrosectomy today. Has PICCs in place. Continuing abx. Will d/w length and further interventions with GI. Getting K+ replaced today. Continue lasix. TED hose in place. Needs to get mobilized.    Assessment & Plan:   Principal Problem:   DKA (diabetic ketoacidosis) (HCC) Active Problems:   Acute pancreatitis   Acute kidney injury (HCC)   Morbid obesity (HCC)   Debility   Protein-calorie malnutrition, severe   Pancreatic necrosis  Severe pancreatitis with peripancreatic fluid collection Necrotizing pancreatitis - questionable superimposed infection.  - Repeat CT abdomen/pelvis on 05/25/2019 showed persistent pancreatitis with new scattered peripancreatic gas, extensive ill-defined peripancreatic fluid extending throughout transverse colon into the root of mesentery. S/p cholecystectomy.  - GI was consulted; apprecaite assistance, 2 necrosectomies so far; possible repeat on Friday w/ Dr. Meridee Score.  - Drainage from pancreatic pseudocyst growing moderate staph aureus, started on vancomycin; continue for now ; continue flagyl for now     - 06/04/19: Continue abx, he reports improvement in pain and overall disposition     -  06/05/19: back from OR after another necrosectomy; doing ok, continuing abx  Recurrent DKA/diabetes mellitus type 2-resolved - Hemoglobin A1c was 13.2 on 04/22/2019.  - 06/04/19: Lantus 10 units SQ daily; continue SSI; looks ok for now, will continue this dosage     - 06/05/19: glucose is ok this AM, monitor  History of hypertension     - 06/05/19: BP is ok, monitor  Acute kidney injury - resovled, monitor     - 06/05/19: started on lasix IV for edema, renal function is ok, monitor  Microcytic anemia - no obvious bleeding - lab work suggestive of anemia of chronic disease in setting of inflammation. - received 1u pRBCs after previous necrocetomy, but did not have good response; he's 7.4 today. Follow Hgb. Transfuse for Hgb less than 7.0.     - 06/04/19: receiving 1 u pRBCs today     - 06/05/19: Hgb is 8.0 this AM, continue to monitor  Hyponatremia Hypomagnesemia - Na+ is stable, monitor - 06/04/19: Mg2+ is resolved, monitor     - 06/05/19: K+ is low, replacing  BLE edema     - TED hose     - elevate legs when in chair/bed     - 06/05/19: IV low dose lasix started  Debility     - PT/OT  DVT prophylaxis: lovenox Code Status: FULL Family Communication: None at bedside.   Disposition Plan: Remains inpt d/t ongoing need for IV abx, IV lasix, possible further procedures.  Consultants:   GI  Procedures:   Necorsectomy x 3  EGD  Antimicrobials:  . Vanc, flagyl   ROS:  Denies CP, N, V, dyspnea . Remainder 10-pt ROS is negative for all not previously mentioned.  Subjective: "I was hiding."  Objective: Vitals:  06/05/19 1014 06/05/19 1024 06/05/19 1035 06/05/19 1101  BP: 132/86 128/71 (!) 148/81 (!) 135/91  Pulse: 98 97 96 (!) 105  Resp: 17 16 17 15   Temp: 98.8 F (37.1 C)   97.6 F (36.4 C)  TempSrc: Temporal     SpO2: 100% 98% 97% 97%  Weight:      Height:        Intake/Output Summary (Last 24 hours) at 06/05/2019 1327 Last  data filed at 06/05/2019 1100 Gross per 24 hour  Intake 2140.31 ml  Output 2975 ml  Net -834.69 ml   Filed Weights   06/03/19 0920 06/04/19 0600 06/05/19 0541  Weight: (!) 141.1 kg (!) 141.6 kg (!) 140.7 kg    Examination:  General: 54 y.o. male resting in bed in NAD Cardiovascular: RRR, +S1, S2, no m/g/r Respiratory: CTABL, no w/r/r, normal WOB GI: BS+, NDNT, obese, soft MSK: No e/c/c Neuro: A&O x 3, no focal deficits Psyc: Appropriate interaction and affect, calm/cooperative   Data Reviewed: I have personally reviewed following labs and imaging studies.  CBC: Recent Labs  Lab 05/30/19 0234 05/30/19 0234 06/01/19 0244 06/01/19 0244 06/02/19 0231 06/03/19 1610 06/04/19 0319 06/04/19 1245 06/05/19 0306  WBC 6.0   < > 11.7*   < > 7.5 8.7 5.7 8.5 6.3  NEUTROABS 3.6  --  9.7*  --   --   --  3.6  --  4.3  HGB 7.7*   < > 7.8*   < > 7.1* 7.4* 6.8* 8.8* 8.0*  HCT 25.3*   < > 25.8*   < > 22.6* 23.8* 22.5* 28.2* 25.9*  MCV 77.1*   < > 79.6*   < > 78.5* 79.9* 83.0 81.5 82.2  PLT 315   < > 355   < > 333 401* 140* 407* 387   < > = values in this interval not displayed.   Basic Metabolic Panel: Recent Labs  Lab 05/30/19 0234 05/31/19 1410 06/01/19 0244 06/02/19 0231 06/03/19 9604 06/04/19 0319 06/05/19 0306  NA   < >  --  134* 134* 133* 136 140  K   < >  --  4.0 3.9 3.5 3.9 3.2*  CL   < >  --  103 106 104 107 108  CO2   < >  --  16* 19* 20* 22 23  GLUCOSE   < >  --  207* 280* 221* 193* 165*  BUN   < >  --  8 11 8 6  <5*  CREATININE   < >  --  0.87 0.96 0.77 0.86 0.70  CALCIUM   < >  --  7.5* 7.4* 7.6* 7.5* 7.8*  MG  --  1.5*  --   --  1.4* 1.8 1.7  PHOS  --   --   --   --  2.5  --  3.7   < > = values in this interval not displayed.   GFR: Estimated Creatinine Clearance: 155.1 mL/min (by C-G formula based on SCr of 0.7 mg/dL). Liver Function Tests: Recent Labs  Lab 06/01/19 0244 06/02/19 0231 06/04/19 0319 06/05/19 0306  AST 18 14* 25 13*  ALT 13 12 14 13     ALKPHOS 66 59 68 59  BILITOT 1.3* 0.9 1.1 0.7  PROT 6.9 6.2* 5.9* 6.3*  ALBUMIN 1.2* 1.3* 1.5* 1.4*   No results for input(s): LIPASE, AMYLASE in the last 168 hours. No results for input(s): AMMONIA in the last 168 hours. Coagulation Profile: No results  for input(s): INR, PROTIME in the last 168 hours. Cardiac Enzymes: No results for input(s): CKTOTAL, CKMB, CKMBINDEX, TROPONINI in the last 168 hours. BNP (last 3 results) No results for input(s): PROBNP in the last 8760 hours. HbA1C: No results for input(s): HGBA1C in the last 72 hours. CBG: Recent Labs  Lab 06/04/19 1117 06/04/19 1616 06/04/19 2105 06/05/19 0647 06/05/19 1115  GLUCAP 201* 163* 141* 153* 130*   Lipid Profile: Recent Labs    06/05/19 0306  TRIG 75   Thyroid Function Tests: No results for input(s): TSH, T4TOTAL, FREET4, T3FREE, THYROIDAB in the last 72 hours. Anemia Panel: No results for input(s): VITAMINB12, FOLATE, FERRITIN, TIBC, IRON, RETICCTPCT in the last 72 hours. Sepsis Labs: No results for input(s): PROCALCITON, LATICACIDVEN in the last 168 hours.  Recent Results (from the past 240 hour(s))  Aerobic/Anaerobic Culture (surgical/deep wound)     Status: None (Preliminary result)   Collection Time: 05/28/19  1:58 PM   Specimen: PATH GI biopsy; Tissue  Result Value Ref Range Status   Specimen Description FLUID PANCREATIC PSUEDOCYST ASPIRATE  Final   Special Requests NONE  Final   Gram Stain   Final    ABUNDANT WBC PRESENT,BOTH PMN AND MONONUCLEAR ABUNDANT GRAM POSITIVE COCCI    Culture   Final    MODERATE STAPHYLOCOCCUS AUREUS FEW BACTEROIDES FRAGILIS BETA LACTAMASE POSITIVE Sent to [facility] for further susceptibility testing. FOR [facility] AUREUS Performed at [facility], [address], [place], [place] [address]    Report Status PENDING  Incomplete  Susceptibility, Aer + Anaerob     Status: Abnormal   Collection Time: 05/28/19  1:58 PM  Result Value Ref Range Status   Suscept, Aer  + Anaerob Preliminary report (A)  Final    Comment: (NOTE) Performed At: [facility] [address] [place], [place] [id number] Jolene Schimke MD UX:[id number]    Source of Sample FLUID/ STAPH AUREUS  Corrected    Comment: Performed at [facility], [address] [place], [place] [address] CORRECTED ON 02/17 AT 1332: PREVIOUSLY REPORTED AS FLUID   Susceptibility Result     Status: Abnormal   Collection Time: 05/28/19  1:58 PM  Result Value Ref Range Status   Suscept Result 1 Staphylococcus aureus (A)  Final    Comment: (NOTE) Identification performed by account, not confirmed by this laboratory. Performed At: [facility] [address] [place], [place] [id number] Jolene Schimke MD DG:[id number]       Radiology Studies: IR PICC PLACEMENT RIGHT >5 YRS INC IMG GUIDE  Result Date: 06/05/2019 INDICATION: Patient with history of malnutrition, request to IR for PICC placement for TPN. EXAM: RIGHT UPPER EXTREMITY PICC LINE PLACEMENT WITH ULTRASOUND AND FLUOROSCOPIC GUIDANCE MEDICATIONS: 3 mL 1% lidocaine. ANESTHESIA/SEDATION: None. FLUOROSCOPY TIME:  Fluoroscopy Time: 1 minutes 0 seconds (10.3 mGy). COMPLICATIONS: None immediate. PROCEDURE: The patient was advised of the possible risks and complications and agreed to undergo the procedure. The patient was then brought to the angiographic suite for the procedure. The right arm was prepped with chlorhexidine, draped in the usual sterile fashion using maximum barrier technique (cap and mask, sterile gown, sterile gloves, large sterile sheet, hand hygiene and cutaneous antisepsis) and infiltrated locally with 1% Lidocaine. Ultrasound demonstrated patency of the right brachial vein, and this was documented with an image. Under real-time ultrasound guidance, this vein was accessed with a 21 gauge micropuncture needle and image documentation was performed. A 0.018 wire was introduced in to the vein. Over this,  a 5 [place] double  lumen power injectable PICC was advanced to the lower SVC/right atrial junction. Fluoroscopy during the procedure and fluoro spot radiograph confirms appropriate catheter position. The catheter was aspirated, flushed, capped and covered with a sterile dressing. Catheter length: 38 cm IMPRESSION: Successful right arm power PICC line placement with ultrasound and fluoroscopic guidance. The catheter is ready for use. Read by Lynnette Caffey, PA-C Electronically Signed   By: Judie Petit.  Shick M.D.   On: 06/05/2019 12:42   [place] EKG SITE RITE  Result Date: 06/04/2019 If Site Rite image not attached, placement could not be confirmed due to current cardiac rhythm.    Scheduled Meds: . sodium chloride   Intravenous Once  . allopurinol  300 mg Oral Daily  . calcium carbonate  1 tablet Oral Daily  . chlorhexidine  15 mL Mouth Rinse BID  . Chlorhexidine Gluconate Cloth  6 each Topical Daily  . diphenoxylate-atropine  2 tablet Oral BID  . enoxaparin (LOVENOX) injection  40 mg Subcutaneous Q24H  . feeding supplement (ENSURE ENLIVE)  237 mL Oral TID BM  . furosemide  20 mg Intravenous BID  . heparin lock flush      . insulin aspart  0-20 Units Subcutaneous TID WC  . insulin aspart  0-5 Units Subcutaneous QHS  . insulin glargine  10 Units Subcutaneous Daily  . lidocaine      . lipase/protease/amylase  36,000 Units Oral TID AC  . mouth rinse  15 mL Mouth Rinse q12n4p  . morphine  30 mg Oral Q12H  . multivitamin with minerals  1 tablet Oral Daily  . pantoprazole  40 mg Oral Daily   Continuous Infusions: . magnesium sulfate bolus IVPB    . metronidazole 500 mg (06/05/19 0546)  . potassium chloride 10 mEq (06/05/19 1124)  . TPN ADULT (ION)    . vancomycin 1,750 mg (06/05/19 0003)     LOS: 24 days    Time spent: 25 minutes spent in the coordination of care today.   Teddy Spike, DO [facility]  If 7PM-7AM, please contact night-coverage [email or web address] 06/05/2019, 1:27 PM

## 2019-06-05 NOTE — Progress Notes (Signed)
PHARMACY - TOTAL PARENTERAL NUTRITION CONSULT NOTE  Indication:  Infected pancreatic pseudocyst  Patient Measurements: Height: 5\' 11"  (180.3 cm) Weight: (!) 310 lb 3 oz (140.7 kg) IBW/kg (Calculated) : 75.3 TPN AdjBW (KG): 91.7 Body mass index is 43.26 kg/m.  Assessment:  45 YOM presented on 05/12/19 with DKA.  Patient was recently discharged after treatment for DKA and acute pancreatitis.  CT showed worsening pancreatitis and the development of a fluid collection.  Patient was started on tube feeding on 1/28 and tolerated it well through 2/10.  A clear liquid diet was initiated on 05/22/19 but intake was minimal, and was off and on a clear diet until 2/15.  EGD on 2/11 showed esophagitis/duodenitis and patient underwent pancreatic and biliary stent placements on 2/12.  He is s/p necrosectomy for pancreatic pseudocyst/necrosis on 2/15 and again on 2/17.  Feeding tube placement delayed due to frequent procedures.  Patient was advanced to a cardiac diet on 2/15 and intake has been inadequate.  In setting of severe malutrition, Pharmacy consulted to manage TPN.  Glucose / Insulin: hx IDDM admitted with DKA - A1c 13.2%.  CBGs acceptable, required 15units rSSI + Lantus 10/d *was on Lantus 45/20 BID and significant amount of SSI while on goal rate TF/oral diet  Electrolytes: K 3.2, Mag 1.7 post 2gm, others WNL Renal: SCr 0.86, BUN WNL LFTs / TGs: LFTs / tbili / TG WNL Prealbumin / albumin: BL prealbumin low at 9.5, albumin low at 1.5 Intake / Output; MIVF: UOP 0.8 ml/kg/hr, net+6L requiring diuresis prior to TPN, LR at 20 ml/hr GI Imaging: none since TPN consult Surgeries / Procedures:  2/19 necrosectomy, no gross lesion in esophagus, erythematous mucosa  Central access: PICC to be placed TPN start date: 06/05/19  Nutritional Goals (per RD rec on 2/18): 2300-2500 kCal, 130-145g protein, >2L fluid per day  Current Nutrition:  HH/CMD:  consumed 5-40% of meals Ensure TID: 2 charted  given  Plan:   Initiate concentrated TPN at 35 ml/hr (goal rate 80 ml/hr).  Advance pending CBGs. TPN will provide 62g AA, 143g CHO and 32g ILE for a total of 1049 kCal, meeting ~43% of patient needs Electrolytes in TPN: standard except increased K and Mag, Cl:Ac 1:1 Continue daily PO multivitamin (no multivitamin and trace elements in TPN) Continue resistant SSI ACHS + Lantus 10 units daily + add 15 units regular insulin in TPN KCL x 5 runs Mag sulfate 3gm IV x 1 F/U AM labs, CBGs, ability to place feeding tube  Siegfried Vieth D. 05-23-1981, PharmD, BCPS, BCCCP 06/05/2019, 10:08 AM

## 2019-06-05 NOTE — Anesthesia Postprocedure Evaluation (Signed)
Anesthesia Post Note  Patient: Caleb Chambers  Procedure(s) Performed: ESOPHAGOGASTRODUODENOSCOPY (EGD) with Necrosectomy (N/A ) ENDOROTOR (N/A ) STENT REMOVAL PANCREATIC STENT PLACEMENT     Patient location during evaluation: PACU Anesthesia Type: General Level of consciousness: awake and alert Pain management: pain level controlled Vital Signs Assessment: post-procedure vital signs reviewed and stable Respiratory status: spontaneous breathing, nonlabored ventilation, respiratory function stable and patient connected to nasal cannula oxygen Cardiovascular status: blood pressure returned to baseline and stable Postop Assessment: no apparent nausea or vomiting Anesthetic complications: no    Last Vitals:  Vitals:   06/05/19 1035 06/05/19 1101  BP: (!) 148/81 (!) 135/91  Pulse: 96 (!) 105  Resp: 17 15  Temp:  36.4 C  SpO2: 97% 97%    Last Pain:  Vitals:   06/05/19 1101  TempSrc:   PainSc: 0-No pain                 Kennieth Rad

## 2019-06-05 NOTE — Transfer of Care (Signed)
Immediate Anesthesia Transfer of Care Note  Patient: Caleb Chambers  Procedure(s) Performed: ESOPHAGOGASTRODUODENOSCOPY (EGD) with Necrosectomy (N/A ) ENDOROTOR (N/A ) STENT REMOVAL PANCREATIC STENT PLACEMENT  Patient Location: PACU  Anesthesia Type:General  Level of Consciousness: drowsy and patient cooperative  Airway & Oxygen Therapy: Patient Spontanous Breathing and Patient connected to face mask oxygen  Post-op Assessment: Report given to RN and Post -op Vital signs reviewed and stable  Post vital signs: Reviewed and stable  Last Vitals:  Vitals Value Taken Time  BP 132/86 06/05/19 1014  Temp    Pulse 98 06/05/19 1015  Resp 19 06/05/19 1015  SpO2 100 % 06/05/19 1015  Vitals shown include unvalidated device data.  Last Pain:  Vitals:   06/05/19 1014  TempSrc:   PainSc: 0-No pain      Patients Stated Pain Goal: 2 (06/04/19 1200)  Complications: No apparent anesthesia complications

## 2019-06-05 NOTE — Plan of Care (Signed)
  Problem: Education: Goal: Ability to describe self-care measures that may prevent or decrease complications (Diabetes Survival Skills Education) will improve Outcome: Progressing Goal: Individualized Educational Video(s) Outcome: Progressing   Problem: Cardiac: Goal: Ability to maintain an adequate cardiac output will improve Outcome: Progressing   Problem: Health Behavior/Discharge Planning: Goal: Ability to identify and utilize available resources and services will improve Outcome: Progressing Goal: Ability to manage health-related needs will improve Outcome: Progressing   Problem: Fluid Volume: Goal: Ability to achieve a balanced intake and output will improve Outcome: Progressing   Problem: Metabolic: Goal: Ability to maintain appropriate glucose levels will improve Outcome: Progressing   Problem: Nutritional: Goal: Maintenance of adequate nutrition will improve Outcome: Progressing Goal: Maintenance of adequate weight for body size and type will improve Outcome: Progressing   Problem: Respiratory: Goal: Will regain and/or maintain adequate ventilation Outcome: Progressing   Problem: Urinary Elimination: Goal: Ability to achieve and maintain adequate renal perfusion and functioning will improve Outcome: Progressing   Problem: Education: Goal: Knowledge of General Education information will improve Description: Including pain rating scale, medication(s)/side effects and non-pharmacologic comfort measures Outcome: Progressing   Problem: Health Behavior/Discharge Planning: Goal: Ability to manage health-related needs will improve Outcome: Progressing   Problem: Clinical Measurements: Goal: Ability to maintain clinical measurements within normal limits will improve Outcome: Progressing Goal: Will remain free from infection Outcome: Progressing Goal: Diagnostic test results will improve Outcome: Progressing Goal: Respiratory complications will improve Outcome:  Progressing Goal: Cardiovascular complication will be avoided Outcome: Progressing   Problem: Activity: Goal: Risk for activity intolerance will decrease Outcome: Progressing   Problem: Nutrition: Goal: Adequate nutrition will be maintained Outcome: Progressing   Problem: Coping: Goal: Level of anxiety will decrease Outcome: Progressing   Problem: Elimination: Goal: Will not experience complications related to bowel motility Outcome: Progressing Goal: Will not experience complications related to urinary retention Outcome: Progressing   Problem: Pain Managment: Goal: General experience of comfort will improve Outcome: Progressing   Problem: Safety: Goal: Ability to remain free from injury will improve Outcome: Progressing   Problem: Skin Integrity: Goal: Risk for impaired skin integrity will decrease Outcome: Progressing   

## 2019-06-05 NOTE — Progress Notes (Signed)
Pharmacy Antibiotic Note  Caleb Chambers is a 54 y.o. male with necrotizing pancreatitis and pancreatic pseudocyst. Pharmacy dosing vancomycin for staph aureus in pseudocyst aspirate cultures (he is also on flagyl for bacteroides fragilis in the same cultures) -2/19: VP= 40, VT (collected late)= 19; AUC= 723 -SCr= 0.7   Plan: -Change vancomycin to 1250 mg IV q12h; estimated AUC= 516 -Will follow culture sensitivities    Height: 5\' 11"  (180.3 cm) Weight: (!) 310 lb 3 oz (140.7 kg) IBW/kg (Calculated) : 75.3  Temp (24hrs), Avg:98 F (36.7 C), Min:97.6 F (36.4 C), Max:98.8 F (37.1 C)  Recent Labs  Lab 06/01/19 0244 06/01/19 0244 06/02/19 0231 06/03/19 0608 06/04/19 0319 06/04/19 1245 06/05/19 0306 06/05/19 1330  WBC 11.7*   < > 7.5 8.7 5.7 8.5 6.3  --   CREATININE 0.87  --  0.96 0.77 0.86  --  0.70  --   VANCOTROUGH  --   --   --   --   --   --   --  18  VANCOPEAK  --   --   --   --   --   --  40  --    < > = values in this interval not displayed.    Estimated Creatinine Clearance: 155.1 mL/min (by C-G formula based on SCr of 0.7 mg/dL).    Allergies  Allergen Reactions  . Penicillins Anaphylaxis, Hives and Swelling    Did it involve swelling of the face/tongue/throat, SOB, or low BP? Yes Did it involve sudden or severe rash/hives, skin peeling, or any reaction on the inside of your mouth or nose? Yes Did you need to seek medical attention at a hospital or doctor's office? Yes When did it last happen?teenager If all above answers are "NO", may proceed with cephalosporin use.  . Metoprolol Other (See Comments)    Chest pains - reported by [facility] and  [facility] 02/08/14  . Clindamycin Rash and Other (See Comments)    Unknown reaction - reported by [facility] and [facility] 09-30-13    Vanc 2/12 >> Cipro 2/8 >> 2/16 Flagyl 2/8 >>2/15; 2/16>>  1/26 covid - negative 1/27 MRSA PCR - negative 2/8 C.diff - negative 2/11 pancreatic pseudocyst aspirate - Staph aureus  (preliminary), bacteroides   4/11, PharmD Clinical Pharmacist **Pharmacist phone directory can now be found on [email or web address] ([email or web address]).  Listed under [facility].

## 2019-06-05 NOTE — Op Note (Signed)
[facility] Patient Name: Caleb Chambers Procedure Date : 06/05/2019 MRN: [id number] Attending MD: Justice Britain , MD Date of Birth: 12-11-1965 CSN: [id number] Age: 54 Admit Type: Inpatient Procedure:                Upper GI endoscopy Indications:              Pancreatic necrosis Providers:                Justice Britain, MD, Vista Lawman, RN, Lazaro Arms, Technician Referring MD:             Carol Ada, MD, [facility] Medicines:                General Anesthesia Complications:            No immediate complications. Estimated Blood Loss:     Estimated blood loss was minimal. Procedure:                Pre-Anesthesia Assessment:                           - Prior to the procedure, a History and Physical                            was performed, and patient medications and                            allergies were reviewed. The patient's tolerance of                            previous anesthesia was also reviewed. The risks                            and benefits of the procedure and the sedation                            options and risks were discussed with the patient.                            All questions were answered, and informed consent                            was obtained. Prior Anticoagulants: The patient has                            taken no previous anticoagulant or antiplatelet                            agents. ASA Grade Assessment: III - A patient with                            severe systemic disease. After reviewing the risks  and benefits, the patient was deemed in                            satisfactory condition to undergo the procedure.                           After obtaining informed consent, the endoscope was                            passed under direct vision. Throughout the                            procedure, the patient's blood pressure, pulse, and               oxygen saturations were monitored continuously. The                            [id number] ([id number] Olympus therapeutic                            gastroscope was introduced through the mouth, and                            advanced to the second part of duodenum. The upper                            GI endoscopy was accomplished without difficulty.                            The patient tolerated the procedure. Scope In: Scope Out: Findings:      No gross lesions were noted in the proximal esophagus and in the mid       esophagus.      LA Grade B (one or more mucosal breaks greater than 5 mm, not extending       between the tops of two mucosal folds) esophagitis was found in the       distal esophagus and at the [employer].      Patchy mildly erythematous mucosa without bleeding was found in the       entire examined stomach.      A previously placed AXIOS cystgastrostomy stent was found on the       posterior wall of the stomach. The two double pigtail stents were within       the cystgastrostomy tract and were removed with a rat-toothed forceps.       The cyst was entered and while decompressed was noted to be completely       filled with fluid and black necrotic tissue that was pasty and adherent       to the cyst wall. Necrosectomy was performed with a Raptor grasping       device and Talon gasping device and Endorotor Powered Endoscopic       Debrider for 90 minutes, requiring multiple intubations of the cyst. At       one point during procedured the AXIOS stent was dislodged. It was       replaced with the same AXIOS into the cavity with successful placement.  At the conclusion of the procedure, a medium amount of necrotic tissue       and a large amount of pink, viable tissue was found within the       pseudocyst on direct vision. Two 10 French x 5 cm double pigtail stents       were inserted across the cystgastrostomy tract.      No gross mucosal lesions were  noted in the duodenal bulb, in the first       portion of the duodenum and in the second portion of the duodenum. Impression:               - No gross lesions in esophagus proximally. LA                            Grade B esophagitis distally and at [employer].                           - Erythematous mucosa in the stomach.                           - Pre-existing AXIOS gastric stent with double                            pigtails. Pigtails are removed. Necrosectomy was                            performed. Double pigtails were replaced.                           - No gross lesions in the duodenal bulb, in the                            first portion of the duodenum and in the second                            portion of the duodenum. Recommendation:           - The patient will be observed post-procedure,                            until all discharge criteria are met.                           - Return patient to hospital ward for ongoing care.                           - Observe patient's clinical course.                           - Continue Antibiotics as per primary medical                            service.                           - Repeat upper endoscopy with Necrosectomy on  2/22                            for retreatment.                           - ADAT.                           - The findings and recommendations were discussed                            with the patient.                           - The findings and recommendations were discussed                            with the patient's family.                           - The findings and recommendations were discussed                            with the referring physician. Procedure Code(s):        --- Professional ---                           [phone], Esophagogastroduodenoscopy, flexible,                            transoral; diagnostic, including collection of                            specimen(s) by brushing or  washing, when performed                            (separate procedure)                           [phone], Unlisted procedure, pancreas Diagnosis Code(s):        --- Professional ---                           K20.90, Esophagitis, unspecified without bleeding                           K31.89, Other diseases of stomach and duodenum                           Z97.8, Presence of other specified devices                           K86.89, Other specified diseases of pancreas CPT copyright 2019 [employer]. All rights reserved. The codes documented in this report are preliminary and upon coder review may  be revised to meet current compliance requirements. Justice Britain, MD 06/05/2019 10:14:59 AM Number of Addenda: 0

## 2019-06-05 NOTE — Anesthesia Procedure Notes (Signed)
Procedure Name: Intubation Date/Time: 06/05/2019 8:13 AM Performed by: Bryson Corona, CRNA Pre-anesthesia Checklist: Patient identified, Emergency Drugs available, Suction available and Patient being monitored Patient Re-evaluated:Patient Re-evaluated prior to induction Oxygen Delivery Method: Circle System Utilized Preoxygenation: Pre-oxygenation with 100% oxygen Induction Type: IV induction Ventilation: Mask ventilation without difficulty Laryngoscope Size: Mac and 4 Grade View: Grade I Tube type: Oral Tube size: 7.5 mm Number of attempts: 1 Airway Equipment and Method: Stylet and Oral airway Placement Confirmation: ETT inserted through vocal cords under direct vision,  positive ETCO2 and breath sounds checked- equal and bilateral Secured at: 22 cm Tube secured with: Tape Dental Injury: Teeth and Oropharynx as per pre-operative assessment

## 2019-06-05 NOTE — Procedures (Signed)
PROCEDURE SUMMARY:  Successful placement of image-guided double lumen PICC line to the right brachial vein. Length 38 cm. Tip at lower SVC/RA. No complications. EBL = <1 mL. Both port flushed, aspirated, capped. Sterile dressing applied. Ready for use.  Please see imaging section of Epic for full dictation.   Villa Herb PA-C 06/05/2019 12:43 PM

## 2019-06-05 NOTE — Progress Notes (Signed)
PT Cancellation Note  Patient Details Name: Caleb Chambers MRN: [id number] DOB: 1965/12/30   Cancelled Treatment:    Reason Eval/Treat Not Completed: Patient declined, no reason specified. PT reporting he is unable to walk due to wearing TED hose. PT informs the patient that this should not prevent him from ambulating or mobilizing and that mobilizing will also help reduce LE swelling. PT acknowledges PT education however he continues to refuse attempts at mobility.   Arlyss Gandy 06/05/2019, 3:54 PM

## 2019-06-05 NOTE — Interval H&P Note (Signed)
History and Physical Interval Note:  06/05/2019 7:33 AM  Caleb Chambers  has presented today for surgery, with the diagnosis of Pancreatic Necrosis.  The various methods of treatment have been discussed with the patient and family. After consideration of risks, benefits and other options for treatment, the patient has consented to  Procedure(s): ESOPHAGOGASTRODUODENOSCOPY (EGD) with Necrosectomy (N/A) ENDOROTOR (N/A) as a surgical intervention.  The patient's history has been reviewed, patient examined, no change in status, stable for surgery.  I have reviewed the patient's chart and labs.  Questions were answered to the patient's satisfaction.     [employer]

## 2019-06-06 LAB — CBC WITH DIFFERENTIAL/PLATELET
Abs Immature Granulocytes: 0.03 10*3/uL (ref 0.00–0.07)
Basophils Absolute: 0 10*3/uL (ref 0.0–0.1)
Basophils Relative: 0 %
Eosinophils Absolute: 0 10*3/uL (ref 0.0–0.5)
Eosinophils Relative: 1 %
HCT: 24.8 % — ABNORMAL LOW (ref 39.0–52.0)
Hemoglobin: 7.5 g/dL — ABNORMAL LOW (ref 13.0–17.0)
Immature Granulocytes: 1 %
Lymphocytes Relative: 30 %
Lymphs Abs: 1.4 10*3/uL (ref 0.7–4.0)
MCH: 25.8 pg — ABNORMAL LOW (ref 26.0–34.0)
MCHC: 30.2 g/dL (ref 30.0–36.0)
MCV: 85.2 fL (ref 80.0–100.0)
Monocytes Absolute: 0.3 10*3/uL (ref 0.1–1.0)
Monocytes Relative: 7 %
Neutro Abs: 2.9 10*3/uL (ref 1.7–7.7)
Neutrophils Relative %: 61 %
Platelets: 341 10*3/uL (ref 150–400)
RBC: 2.91 MIL/uL — ABNORMAL LOW (ref 4.22–5.81)
RDW: 20.1 % — ABNORMAL HIGH (ref 11.5–15.5)
WBC: 4.7 10*3/uL (ref 4.0–10.5)
nRBC: 0.4 % — ABNORMAL HIGH (ref 0.0–0.2)

## 2019-06-06 LAB — BASIC METABOLIC PANEL
Anion gap: 7 (ref 5–15)
BUN: 5 mg/dL — ABNORMAL LOW (ref 6–20)
CO2: 25 mmol/L (ref 22–32)
Calcium: 7.5 mg/dL — ABNORMAL LOW (ref 8.9–10.3)
Chloride: 106 mmol/L (ref 98–111)
Creatinine, Ser: 0.74 mg/dL (ref 0.61–1.24)
GFR calc Af Amer: >60 mL/min (ref >60)
GFR calc non Af Amer: >60 mL/min (ref >60)
Glucose, Bld: 218 mg/dL — ABNORMAL HIGH (ref 70–99)
Potassium: 3.2 mmol/L — ABNORMAL LOW (ref 3.5–5.1)
Sodium: 138 mmol/L (ref 135–145)

## 2019-06-06 LAB — MAGNESIUM: Magnesium: 1.6 mg/dL — ABNORMAL LOW (ref 1.7–2.4)

## 2019-06-06 LAB — GLUCOSE, CAPILLARY
Glucose-Capillary: 195 mg/dL — ABNORMAL HIGH (ref 70–99)
Glucose-Capillary: 217 mg/dL — ABNORMAL HIGH (ref 70–99)
Glucose-Capillary: 185 mg/dL — ABNORMAL HIGH (ref 70–99)
Glucose-Capillary: 246 mg/dL — ABNORMAL HIGH (ref 70–99)

## 2019-06-06 LAB — SUSCEPTIBILITY RESULT

## 2019-06-06 LAB — SUSCEPTIBILITY, AER + ANAEROB

## 2019-06-06 LAB — PHOSPHORUS: Phosphorus: 3.3 mg/dL (ref 2.5–4.6)

## 2019-06-06 MED ORDER — POTASSIUM CHLORIDE CRYS ER 20 MEQ PO TBCR
40.0000 meq | EXTENDED_RELEASE_TABLET | Freq: Two times a day (BID) | ORAL | Status: AC
  Administered 2019-06-06 (×2): 40 meq via ORAL
  Filled 2019-06-06 (×2): qty 2

## 2019-06-06 MED ORDER — POTASSIUM CHLORIDE CRYS ER 20 MEQ PO TBCR
20.0000 meq | EXTENDED_RELEASE_TABLET | Freq: Once | ORAL | Status: AC
  Administered 2019-06-06: 20 meq via ORAL
  Filled 2019-06-06: qty 1

## 2019-06-06 MED ORDER — MAGNESIUM SULFATE 4 GM/100ML IV SOLN
4.0000 g | Freq: Once | INTRAVENOUS | Status: AC
  Administered 2019-06-06: 11:00:00 4 g via INTRAVENOUS
  Filled 2019-06-06: qty 100

## 2019-06-06 MED ORDER — STERILE WATER FOR INJECTION IV SOLN
INTRAVENOUS | Status: AC
  Filled 2019-06-06: qty 592

## 2019-06-06 MED ORDER — LISINOPRIL 10 MG PO TABS
10.0000 mg | ORAL_TABLET | Freq: Every day | ORAL | Status: DC
  Administered 2019-06-07: 10 mg via ORAL
  Filled 2019-06-06 (×2): qty 1

## 2019-06-06 NOTE — Progress Notes (Signed)
PHARMACY - TOTAL PARENTERAL NUTRITION CONSULT NOTE  Indication:  Infected pancreatic pseudocyst  Patient Measurements: Height: 5\' 11"  (180.3 cm) Weight: (!) 313 lb 4.4 oz (142.1 kg) IBW/kg (Calculated) : 75.3 TPN AdjBW (KG): 91.7 Body mass index is 43.69 kg/m.  Assessment:  16 YOM presented on 05/12/19 with DKA.  Patient was recently discharged after treatment for DKA and acute pancreatitis.  CT showed worsening pancreatitis and the development of a fluid collection.  Patient was started on tube feeding on 1/28 and tolerated it well through 2/10.  A clear liquid diet was initiated on 05/22/19 but intake was minimal, and was off and on a clear diet until 2/15.  EGD on 2/11 showed esophagitis/duodenitis and patient underwent pancreatic and biliary stent placements on 2/12.  He is s/p necrosectomy for pancreatic pseudocyst/necrosis on 2/15 and again on 2/17.  Feeding tube placement delayed due to frequent procedures.  Patient was advanced to a cardiac diet on 2/15 and intake has been inadequate.  In setting of severe malutrition, Pharmacy consulted to manage TPN.  Glucose / Insulin: hx IDDM admitted with DKA - A1c 13.2%.  CBGs trending up---likely b/c Lantus was not given yesterday, required 11 units rSSI + Lantus 10/d + 15 units in TPN *was on Lantus 45/20 BID and significant amount of SSI while on goal rate TF/oral diet  Electrolytes: K 3.2 on Lasix 20mg  IV BID (3/15 given), Mag 1.6 post 3gm, others WNL Renal: SCr 0.86, BUN WNL LFTs / TGs: LFTs / tbili / TG WNL Prealbumin / albumin: BL prealbumin low at 9.5, albumin low at 1.5 Intake / Output; MIVF: UOP 0.8 ml/kg/hr, net +6L requiring diuresis prior to TPN, LR at 20 ml/hr GI Imaging: none since TPN consult Surgeries / Procedures:  2/19 necrosectomy, no gross lesion in esophagus, erythematous mucosa  Central access: PICC placed 06/05/19 TPN start date: 06/05/19  Nutritional Goals (per RD rec on 2/18): 2300-2500 kCal, 130-145g protein, >2L  fluid per day  Current Nutrition:  Heart diet:  less intake since has several procedures yesterday Ensure TID: none charted given  Plan:   Increase concentrated TPN slightly to 50 ml/hr (goal rate 80 ml/hr).  Advance to goal pending CBGs. TPN will provide 99g AA, 204g CHO and 45g ILE for a total of 1499 kCal, meeting ~62% of patient needs Electrolytes in TPN: standard except increased Mag and K (70 mEq/d in TPN), Cl:Ac 1:1 Continue daily PO multivitamin (no multivitamin and trace elements in TPN) Continue resistant SSI ACHS + Lantus 10 units daily + increase regular insulin in TPN to 22 units KCL 06/07/19 PO BID x2 today (total of 3/18 outside of TPN), f/u Lasix LOT Mag sulfate 4gm IV x 1 Calcium carbonate daily per MD F/U AM labs, CBGs, ability to place feeding tube  Caleb Chambers D. , PharmD, BCPS, BCCCP 06/06/2019, 9:05 AM

## 2019-06-06 NOTE — Progress Notes (Signed)
[name]  PROGRESS NOTE    Caleb Chambers  GTX:[id number] DOB: [birth year] DOA: 05/12/2019 PCP: Patient, No Pcp Per   Brief Narrative:   54 year old African-American male, morbidly obese, with past medical history significant for hypertension,DM, gout; and recent admission for pancreatitis and DKA. Patient presented to the hospital with complaints of minimal oral intake since his discharge. Patient reported shortness of breath and decreased ability to eat and drink. He has not been fully compliant with insulin regimen as well. Patient was then admitted to the hospital again for DKA and acute kidney injury, prerenal. DKA and acute kidney injury have resolved. Patient is currently being managed for complications of severe pancreatitis.  06/06/19: Appreciate GI assistance. Going for another necrosectomy Monday. TPN onboard. Correcting lytes through TPN. Monitor. He's in good spirits this AM. Says his leg swelling is feeling much better. He is doing fine otherwise. Need to work with therapy and get mobilized as much as possible.    Assessment & Plan:   Principal Problem:   DKA (diabetic ketoacidosis) ([place]) Active Problems:   Acute pancreatitis   Acute kidney injury ([place])   Morbid obesity ([place])   Debility   Protein-calorie malnutrition, severe   Pancreatic necrosis  Severe pancreatitis with peripancreatic fluid collection Necrotizing pancreatitis - questionable superimposed infection.  - Repeat CT abdomen/pelvis on 05/25/2019 showed persistent pancreatitis with new scattered peripancreatic gas, extensive ill-defined peripancreatic fluid extending throughout transverse colon into the root of mesentery. S/p cholecystectomy.  - GI was consulted; apprecaite assistance, 2 necrosectomies so far; possible repeat on Friday w/ Dr. Rush Landmark.  - Drainage from pancreatic pseudocyst growing moderate staph aureus, started on vancomycin; continue for now ; continue flagyl for now -  06/04/19:Continue abx, he reports improvement in pain and overall disposition     - 06/05/19: back from OR after another necrosectomy; doing ok, continuing abx     - 06/06/19: spoke with GI yesterday; will be on abx for at least 4 weeks, still awaiting sensitivities. Back to OR on Monday. Ab pain is way down and he is feeling well today. Needs to work with therapy and continue IS.   Recurrent DKA/diabetes mellitus type 2-resolved - Hemoglobin A1c was 13.2 on 04/22/2019.  -06/04/19:Lantus 10 units SQ daily; continue SSI; looks ok for now, will continue this dosage     - 06/05/19: glucose is ok this AM, monitor     - 06/06/19: glucose is still acceptable, monitor  History of hypertension     - 06/05/19: BP is ok, monitor     - 06/06/19: BP is elevated, let's start back some of his home meds. Will start with lisinopril at 10mg  qday  Acute kidney injury - resovled, monitor - 06/05/19: started on lasix IV for edema, renal function is ok, monitor     - 06/05/19: renal function looks ok; starting back lisinopril, keep monitor  Microcytic anemia - no obvious bleeding - lab work suggestive of anemia of chronic disease in setting of inflammation. - received 1u pRBCs after previous necrocetomy, but did not have good response; he's 7.4 today. Follow Hgb. Transfuse for Hgb less than 7.0. - 06/04/19: receiving 1 u pRBCs today     - 06/05/19: Hgb is 8.0 this AM, continue to monitor     - 06/06/19: labs pending  Hyponatremia Hypomagnesemia - Na+ is stable, monitor -06/04/19: Mg2+ is resolved, monitor     - 06/05/19: K+ is low, replacing     - 06/06/19: K+, Mg2+ low, but replacing through TPN  BLE edema - TED hose - elevate legs when in chair/bed     - 06/05/19: IV low dose lasix started     - 06/06/19: this is improving, monitor  Debility - PT/OT  DVT prophylaxis: lovenox Code Status: FULL Family Communication: Ask if he would like me to update  family, he said "No."   Disposition Plan: Remains inpt d/t ongoing need for IV abx, IV lasix and further necrosectomies.   Consultants:   GI  Procedures:   Necrosectomy x 3  EGD  Antimicrobials:  . Vanc, flagyl   ROS:  Denies ab pain, N, V, dyspnea, CP . Remainder 10-pt ROS is negative for all not previously mentioned.  Subjective: "I can't really think of anything."  Objective: Vitals:   06/05/19 2337 06/06/19 0307 06/06/19 0500 06/06/19 0736  BP: (!) 152/75 (!) 141/66  (!) 147/69  Pulse: 88 86  62  Resp: 16 14  18   Temp: 98.3 F (36.8 C) 98.2 F (36.8 C)  (!) 97.5 F (36.4 C)  TempSrc: Oral Oral  Oral  SpO2: 98% 97%  99%  Weight:   (!) 142.1 kg   Height:        Intake/Output Summary (Last 24 hours) at 06/06/2019 1110 Last data filed at 06/06/2019 1005 Gross per 24 hour  Intake 2476.13 ml  Output 875 ml  Net 1601.13 ml   Filed Weights   06/04/19 0600 06/05/19 0541 06/06/19 0500  Weight: (!) 141.6 kg (!) 140.7 kg (!) 142.1 kg    Examination:  General: 54 y.o. male resting in bed in NAD Cardiovascular: RRR, +S1, S2, no m/g/r, equal pulses throughout Respiratory: CTABL, no w/r/r, normal WOB GI: BS+, NDNT, obese, soft MSK: No c/c; BLE edema improving Neuro: Alert to name, follows commands Psyc: Appropriate interaction and affect, calm/cooperative   Data Reviewed: I have personally reviewed following labs and imaging studies.  CBC: Recent Labs  Lab 06/01/19 0244 06/01/19 0244 06/02/19 0231 06/03/19 0608 06/04/19 0319 06/04/19 1245 06/05/19 0306  WBC 11.7*   < > 7.5 8.7 5.7 8.5 6.3  NEUTROABS 9.7*  --   --   --  3.6  --  4.3  HGB 7.8*   < > 7.1* 7.4* 6.8* 8.8* 8.0*  HCT 25.8*   < > 22.6* 23.8* 22.5* 28.2* 25.9*  MCV 79.6*   < > 78.5* 79.9* 83.0 81.5 82.2  PLT 355   < > 333 401* 140* 407* 387   < > = values in this interval not displayed.   Basic Metabolic Panel: Recent Labs  Lab 05/31/19 1410 06/01/19 0244 06/02/19 0231 06/03/19 06/05/19  06/04/19 0319 06/05/19 0306 06/06/19 0325  NA  --    < > 134* 133* 136 140 138  K  --    < > 3.9 3.5 3.9 3.2* 3.2*  CL  --    < > 106 104 107 108 106  CO2  --    < > 19* 20* 22 23 25   GLUCOSE  --    < > 280* 221* 193* 165* 218*  BUN  --    < > 11 8 6  <5* 5*  CREATININE  --    < > 0.96 0.77 0.86 0.70 0.74  CALCIUM  --    < > 7.4* 7.6* 7.5* 7.8* 7.5*  MG 1.5*  --   --  1.4* 1.8 1.7 1.6*  PHOS  --   --   --  2.5  --  3.7 3.3   < > =  values in this interval not displayed.   GFR: Estimated Creatinine Clearance: 155.8 mL/min (by C-G formula based on SCr of 0.74 mg/dL). Liver Function Tests: Recent Labs  Lab 06/01/19 0244 06/02/19 0231 06/04/19 0319 06/05/19 0306  AST 18 14* 25 13*  ALT 13 12 14 13   ALKPHOS 66 59 68 59  BILITOT 1.3* 0.9 1.1 0.7  PROT 6.9 6.2* 5.9* 6.3*  ALBUMIN 1.2* 1.3* 1.5* 1.4*   No results for input(s): LIPASE, AMYLASE in the last 168 hours. No results for input(s): AMMONIA in the last 168 hours. Coagulation Profile: No results for input(s): INR, PROTIME in the last 168 hours. Cardiac Enzymes: No results for input(s): CKTOTAL, CKMB, CKMBINDEX, TROPONINI in the last 168 hours. BNP (last 3 results) No results for input(s): PROBNP in the last 8760 hours. HbA1C: No results for input(s): HGBA1C in the last 72 hours. CBG: Recent Labs  Lab 06/05/19 1115 06/05/19 1402 06/05/19 1631 06/05/19 2131 06/06/19 0552  GLUCAP 130* 136* 171* 193* 195*   Lipid Profile: Recent Labs    06/05/19 0306  TRIG 75   Thyroid Function Tests: No results for input(s): TSH, T4TOTAL, FREET4, T3FREE, THYROIDAB in the last 72 hours. Anemia Panel: No results for input(s): VITAMINB12, FOLATE, FERRITIN, TIBC, IRON, RETICCTPCT in the last 72 hours. Sepsis Labs: No results for input(s): PROCALCITON, LATICACIDVEN in the last 168 hours.  Recent Results (from the past 240 hour(s))  Aerobic/Anaerobic Culture (surgical/deep wound)     Status: None (Preliminary result)   Collection  Time: 05/28/19  1:58 PM   Specimen: PATH GI biopsy; Tissue  Result Value Ref Range Status   Specimen Description FLUID PANCREATIC PSUEDOCYST ASPIRATE  Final   Special Requests NONE  Final   Gram Stain   Final    ABUNDANT WBC PRESENT,BOTH PMN AND MONONUCLEAR ABUNDANT GRAM POSITIVE COCCI    Culture   Final    MODERATE STAPHYLOCOCCUS AUREUS FEW BACTEROIDES FRAGILIS BETA LACTAMASE POSITIVE Sent to [facility] for further susceptibility testing. FOR [facility] AUREUS Performed at [facility], [address], [place], [place]    Report Status PENDING  Incomplete  Susceptibility, Aer + Anaerob     Status: Abnormal   Collection Time: 05/28/19  1:58 PM  Result Value Ref Range Status   Suscept, Aer + Anaerob Preliminary report (A)  Final    Comment: (NOTE) Performed At: [facility] [address] [place], [place] [id number] MD Jolene Schimke    Source of Sample FLUID/ STAPH AUREUS  Corrected    Comment: Performed at [facility], [address], [place], [place] CORRECTED ON 02/17 AT 1332: PREVIOUSLY REPORTED AS FLUID   Susceptibility Result     Status: Abnormal   Collection Time: 05/28/19  1:58 PM  Result Value Ref Range Status   Suscept Result 1 Staphylococcus aureus (A)  Final    Comment: (NOTE) Identification performed by account, not confirmed by this laboratory. Performed At: [facility] [address] [place], [place] [id number] MD Jolene Schimke       Radiology Studies: IR PICC PLACEMENT RIGHT >5 YRS INC IMG GUIDE  Result Date: 06/05/2019 INDICATION: Patient with history of malnutrition, request to IR for PICC placement for TPN. EXAM: RIGHT UPPER EXTREMITY PICC LINE PLACEMENT WITH ULTRASOUND AND FLUOROSCOPIC GUIDANCE MEDICATIONS: 3 mL 1% lidocaine. ANESTHESIA/SEDATION: None. FLUOROSCOPY TIME:  Fluoroscopy Time: 1 minutes 0 seconds (10.3 mGy). COMPLICATIONS: None immediate. PROCEDURE: The patient was  advised of the possible risks and  complications and agreed to undergo the procedure. The patient was then brought to the angiographic suite for the procedure. The right arm was prepped with chlorhexidine, draped in the usual sterile fashion using maximum barrier technique (cap and mask, sterile gown, sterile gloves, large sterile sheet, hand hygiene and cutaneous antisepsis) and infiltrated locally with 1% Lidocaine. Ultrasound demonstrated patency of the right brachial vein, and this was documented with an image. Under real-time ultrasound guidance, this vein was accessed with a 21 gauge micropuncture needle and image documentation was performed. A 0.018 wire was introduced in to the vein. Over this, a 5 [place] double lumen power injectable PICC was advanced to the lower SVC/right atrial junction. Fluoroscopy during the procedure and fluoro spot radiograph confirms appropriate catheter position. The catheter was aspirated, flushed, capped and covered with a sterile dressing. Catheter length: 38 cm IMPRESSION: Successful right arm power PICC line placement with ultrasound and fluoroscopic guidance. The catheter is ready for use. Read by Lynnette Caffey, PA-C Electronically Signed   By: Judie Petit.  Shick M.D.   On: 06/05/2019 12:42   [place] EKG SITE RITE  Result Date: 06/04/2019 If Site Rite image not attached, placement could not be confirmed due to current cardiac rhythm.    Scheduled Meds: . sodium chloride   Intravenous Once  . allopurinol  300 mg Oral Daily  . calcium carbonate  1 tablet Oral Daily  . chlorhexidine  15 mL Mouth Rinse BID  . Chlorhexidine Gluconate Cloth  6 each Topical Daily  . diphenoxylate-atropine  2 tablet Oral BID  . enoxaparin (LOVENOX) injection  40 mg Subcutaneous Q24H  . feeding supplement (ENSURE ENLIVE)  237 mL Oral TID BM  . furosemide  20 mg Intravenous BID  . insulin aspart  0-20 Units Subcutaneous TID WC  . insulin aspart  0-5 Units Subcutaneous QHS  . insulin glargine   10 Units Subcutaneous Daily  . lipase/protease/amylase  36,000 Units Oral TID AC  . morphine  30 mg Oral Q12H  . multivitamin with minerals  1 tablet Oral Daily  . pantoprazole  40 mg Oral Daily  . potassium chloride  40 mEq Oral BID   Continuous Infusions: . magnesium sulfate bolus IVPB 4 g (06/06/19 1039)  . metronidazole 500 mg (06/06/19 0556)  . TPN ADULT (ION) 35 mL/hr at 06/06/19 0000  . TPN ADULT (ION)    . vancomycin 1,250 mg (06/06/19 0319)     LOS: 25 days    Time spent: 25 minutes spent in the coordination of care today.    Teddy Spike, DO [facility]  If 7PM-7AM, please contact night-coverage [email or web address] 06/06/2019, 11:10 AM

## 2019-06-06 NOTE — Progress Notes (Signed)
Subjective: Feeling well.  Objective: Vital signs in last 24 hours: Temp:  [97.5 F (36.4 C)-98.8 F (37.1 C)] 97.5 F (36.4 C) (02/20 0736) Pulse Rate:  [62-105] 62 (02/20 0736) Resp:  [14-22] 18 (02/20 0736) BP: (128-172)/(66-97) 147/69 (02/20 0736) SpO2:  [97 %-100 %] 99 % (02/20 0736) Weight:  [142.1 kg] 142.1 kg (02/20 0500) Last BM Date: 06/04/19  Intake/Output from previous day: 02/19 0701 - 02/20 0700 In: 3236.1 [P.O.:600; I.V.:1409.8; IV Piggyback:1226.4] Out: 1150 [Urine:1150] Intake/Output this shift: Total I/O In: 240 [P.O.:240] Out: -   General appearance: alert and no distress GI: soft, non-tender; bowel sounds normal; no masses,  no organomegaly Extremities: 4+ Edema  Lab Results: Recent Labs    06/04/19 0319 06/04/19 1245 06/05/19 0306  WBC 5.7 8.5 6.3  HGB 6.8* 8.8* 8.0*  HCT 22.5* 28.2* 25.9*  PLT 140* 407* 387   BMET Recent Labs    06/04/19 0319 06/05/19 0306 06/06/19 0325  NA 136 140 138  K 3.9 3.2* 3.2*  CL 107 108 106  CO2 22 23 25   GLUCOSE 193* 165* 218*  BUN 6 <5* 5*  CREATININE 0.86 0.70 0.74  CALCIUM 7.5* 7.8* 7.5*   LFT Recent Labs    06/05/19 0306  PROT 6.3*  ALBUMIN 1.4*  AST 13*  ALT 13  ALKPHOS 59  BILITOT 0.7   PT/INR No results for input(s): LABPROT, INR in the last 72 hours. Hepatitis Panel No results for input(s): HEPBSAG, HCVAB, HEPAIGM, HEPBIGM in the last 72 hours. C-Diff No results for input(s): CDIFFTOX in the last 72 hours. Fecal Lactopherrin No results for input(s): FECLLACTOFRN in the last 72 hours.  Studies/Results: IR PICC PLACEMENT RIGHT >5 YRS INC IMG GUIDE  Result Date: 06/05/2019 INDICATION: Patient with history of malnutrition, request to IR for PICC placement for TPN. EXAM: RIGHT UPPER EXTREMITY PICC LINE PLACEMENT WITH ULTRASOUND AND FLUOROSCOPIC GUIDANCE MEDICATIONS: 3 mL 1% lidocaine. ANESTHESIA/SEDATION: None. FLUOROSCOPY TIME:  Fluoroscopy Time: 1 minutes 0 seconds (10.3 mGy).  COMPLICATIONS: None immediate. PROCEDURE: The patient was advised of the possible risks and complications and agreed to undergo the procedure. The patient was then brought to the angiographic suite for the procedure. The right arm was prepped with chlorhexidine, draped in the usual sterile fashion using maximum barrier technique (cap and mask, sterile gown, sterile gloves, large sterile sheet, hand hygiene and cutaneous antisepsis) and infiltrated locally with 1% Lidocaine. Ultrasound demonstrated patency of the right brachial vein, and this was documented with an image. Under real-time ultrasound guidance, this vein was accessed with a 21 gauge micropuncture needle and image documentation was performed. A 0.018 wire was introduced in to the vein. Over this, a 5 [place] double lumen power injectable PICC was advanced to the lower SVC/right atrial junction. Fluoroscopy during the procedure and fluoro spot radiograph confirms appropriate catheter position. The catheter was aspirated, flushed, capped and covered with a sterile dressing. Catheter length: 38 cm IMPRESSION: Successful right arm power PICC line placement with ultrasound and fluoroscopic guidance. The catheter is ready for use. Read by Candiss Norse, PA-C Electronically Signed   By: Jerilynn Mages.  Shick M.D.   On: 06/05/2019 12:42   [place] EKG SITE RITE  Result Date: 06/04/2019 If Site Rite image not attached, placement could not be confirmed due to current cardiac rhythm.   Medications:  Scheduled: . sodium chloride   Intravenous Once  . allopurinol  300 mg Oral Daily  . calcium carbonate  1 tablet Oral Daily  . chlorhexidine  15 mL Mouth Rinse BID  . Chlorhexidine Gluconate Cloth  6 each Topical Daily  . diphenoxylate-atropine  2 tablet Oral BID  . enoxaparin (LOVENOX) injection  40 mg Subcutaneous Q24H  . feeding supplement (ENSURE ENLIVE)  237 mL Oral TID BM  . furosemide  20 mg Intravenous BID  . insulin aspart  0-20 Units Subcutaneous TID WC  .  insulin aspart  0-5 Units Subcutaneous QHS  . insulin glargine  10 Units Subcutaneous Daily  . lipase/protease/amylase  36,000 Units Oral TID AC  . mouth rinse  15 mL Mouth Rinse q12n4p  . morphine  30 mg Oral Q12H  . multivitamin with minerals  1 tablet Oral Daily  . pantoprazole  40 mg Oral Daily  . potassium chloride  40 mEq Oral BID   Continuous: . magnesium sulfate bolus IVPB    . metronidazole 500 mg (06/06/19 0556)  . TPN ADULT (ION) 35 mL/hr at 06/06/19 0000  . TPN ADULT (ION)    . vancomycin 1,250 mg (06/06/19 0319)    Assessment/Plan: 1) Severe acute pancreatitis. 2) Infected pseudocyst/WON. 3) Severe malnutrition. 4) DM. 5) Edema.   The patient continues to improve clinically.  His spirits are better.  He is hoping to go home some time next week.  TPN was initiated and he is tolerating the nutrition.  The Lasix is working well for him as he had one urinary accident last evening.  HGB is stable.  Plan: 1) Continue with supportive care. 2) Correct electrolytes per Hospitalist/Pharmacy. 3) Plan for repeat necrosectomy on Monday.   4) Encouraged participation with PT.   LOS: 25 days   Aric Jost D 06/06/2019, 9:17 AM

## 2019-06-07 LAB — COMPREHENSIVE METABOLIC PANEL
ALT: 10 U/L (ref 0–44)
AST: 10 U/L — ABNORMAL LOW (ref 15–41)
Albumin: 1.4 g/dL — ABNORMAL LOW (ref 3.5–5.0)
Alkaline Phosphatase: 55 U/L (ref 38–126)
Anion gap: 7 (ref 5–15)
BUN: 5 mg/dL — ABNORMAL LOW (ref 6–20)
CO2: 23 mmol/L (ref 22–32)
Calcium: 7.7 mg/dL — ABNORMAL LOW (ref 8.9–10.3)
Chloride: 107 mmol/L (ref 98–111)
Creatinine, Ser: 0.55 mg/dL — ABNORMAL LOW (ref 0.61–1.24)
GFR calc Af Amer: >60 mL/min (ref >60)
GFR calc non Af Amer: >60 mL/min (ref >60)
Glucose, Bld: 257 mg/dL — ABNORMAL HIGH (ref 70–99)
Potassium: 3.3 mmol/L — ABNORMAL LOW (ref 3.5–5.1)
Sodium: 137 mmol/L (ref 135–145)
Total Bilirubin: 0.3 mg/dL (ref 0.3–1.2)
Total Protein: 6.1 g/dL — ABNORMAL LOW (ref 6.5–8.1)

## 2019-06-07 LAB — CBC WITH DIFFERENTIAL/PLATELET
Abs Immature Granulocytes: 0 10*3/uL (ref 0.00–0.07)
Band Neutrophils: 13 %
Basophils Absolute: 0 10*3/uL (ref 0.0–0.1)
Basophils Relative: 1 %
Eosinophils Absolute: 0 10*3/uL (ref 0.0–0.5)
Eosinophils Relative: 0 %
HCT: 25.6 % — ABNORMAL LOW (ref 39.0–52.0)
Hemoglobin: 7.7 g/dL — ABNORMAL LOW (ref 13.0–17.0)
Lymphocytes Relative: 16 %
Lymphs Abs: 0.8 10*3/uL (ref 0.7–4.0)
MCH: 25.7 pg — ABNORMAL LOW (ref 26.0–34.0)
MCHC: 30.1 g/dL (ref 30.0–36.0)
MCV: 85.3 fL (ref 80.0–100.0)
Monocytes Absolute: 0.2 10*3/uL (ref 0.1–1.0)
Monocytes Relative: 4 %
Neutro Abs: 3.8 10*3/uL (ref 1.7–7.7)
Neutrophils Relative %: 66 %
Platelets: 348 10*3/uL (ref 150–400)
RBC: 3 MIL/uL — ABNORMAL LOW (ref 4.22–5.81)
RDW: 20.9 % — ABNORMAL HIGH (ref 11.5–15.5)
WBC: 4.8 10*3/uL (ref 4.0–10.5)
nRBC: 0 % (ref 0.0–0.2)
nRBC: 0 /100 WBC

## 2019-06-07 LAB — GLUCOSE, CAPILLARY
Glucose-Capillary: 294 mg/dL — ABNORMAL HIGH (ref 70–99)
Glucose-Capillary: 220 mg/dL — ABNORMAL HIGH (ref 70–99)
Glucose-Capillary: 250 mg/dL — ABNORMAL HIGH (ref 70–99)
Glucose-Capillary: 229 mg/dL — ABNORMAL HIGH (ref 70–99)
Glucose-Capillary: 196 mg/dL — ABNORMAL HIGH (ref 70–99)

## 2019-06-07 LAB — AEROBIC/ANAEROBIC CULTURE W GRAM STAIN (SURGICAL/DEEP WOUND)

## 2019-06-07 LAB — PHOSPHORUS: Phosphorus: 2.9 mg/dL (ref 2.5–4.6)

## 2019-06-07 LAB — MAGNESIUM: Magnesium: 1.6 mg/dL — ABNORMAL LOW (ref 1.7–2.4)

## 2019-06-07 MED ORDER — INSULIN ASPART 100 UNIT/ML ~~LOC~~ SOLN
0.0000 [IU] | SUBCUTANEOUS | Status: DC
  Administered 2019-06-07: 3 [IU] via SUBCUTANEOUS
  Administered 2019-06-07: 4 [IU] via SUBCUTANEOUS
  Administered 2019-06-07 (×2): 7 [IU] via SUBCUTANEOUS
  Administered 2019-06-08 – 2019-06-09 (×5): 4 [IU] via SUBCUTANEOUS
  Administered 2019-06-09: 20:00:00 7 [IU] via SUBCUTANEOUS
  Administered 2019-06-09 (×4): 4 [IU] via SUBCUTANEOUS
  Administered 2019-06-10: 13:00:00 7 [IU] via SUBCUTANEOUS
  Administered 2019-06-10 (×3): 4 [IU] via SUBCUTANEOUS

## 2019-06-07 MED ORDER — STERILE WATER FOR INJECTION IV SOLN
INTRAVENOUS | Status: AC
  Filled 2019-06-07: qty 592

## 2019-06-07 MED ORDER — MAGNESIUM SULFATE 50 % IJ SOLN
6.0000 g | Freq: Once | INTRAVENOUS | Status: AC
  Administered 2019-06-07: 6 g via INTRAVENOUS
  Filled 2019-06-07 (×2): qty 12

## 2019-06-07 MED ORDER — POTASSIUM CHLORIDE CRYS ER 20 MEQ PO TBCR
40.0000 meq | EXTENDED_RELEASE_TABLET | Freq: Three times a day (TID) | ORAL | Status: AC
  Administered 2019-06-07 (×3): 40 meq via ORAL
  Filled 2019-06-07 (×3): qty 2

## 2019-06-07 NOTE — Plan of Care (Signed)

## 2019-06-07 NOTE — Progress Notes (Signed)
PHARMACY - TOTAL PARENTERAL NUTRITION CONSULT NOTE  Indication:  Infected pancreatic pseudocyst  Patient Measurements: Height: 5\' 11"  (180.3 cm) Weight: (!) 311 lb 4.6 oz (141.2 kg) IBW/kg (Calculated) : 75.3 TPN AdjBW (KG): 91.7 Body mass index is 43.Caleb kg/m.  Assessment:  Caleb Chambers presented on 05/12/19 with DKA.  Patient was recently discharged after treatment for DKA and acute pancreatitis.  CT showed worsening pancreatitis and the development of a fluid collection.  Patient was started on tube feeding on 1/28 and tolerated it well through 2/10.  A clear liquid diet was initiated on 05/22/19 but intake was minimal, and was off and on a clear diet until 2/15.  EGD on 2/11 showed esophagitis/duodenitis and patient underwent pancreatic and biliary stent placements on 2/12.  He is s/p necrosectomy for pancreatic pseudocyst/necrosis on 2/15 and again on 2/17.  Feeding tube placement delayed due to frequent procedures.  Patient was advanced to a cardiac diet on 2/15 and intake has been inadequate.  In setting of severe malutrition, Pharmacy consulted to manage TPN.  Glucose / Insulin: hx IDDM admitted with DKA - A1c 13.2%.  CBGs elevated with TPN, required 13 units rSSI + Lantus 10/d + 22 units in TPN *was on Lantus 45/20 BID and significant amount of SSI while on goal rate TF/oral diet  Electrolytes: K 3.3 on Lasix 20mg  IV BID (3/15 given outside of TPN yesterday), Mag unchanged at 1.6 post 4gm, Phos trending down Renal: SCr 0.55, BUN WNL LFTs / TGs: LFTs / tbili / TG WNL Prealbumin / albumin: BL prealbumin low at 9.5, albumin low at 1.4 Intake / Output; MIVF: UOP 0.6 ml/kg/hr, net +6.2L requiring diuresis prior to TPN, LR at 20 ml/hr GI Imaging: none since TPN consult Surgeries / Procedures:  2/19 necrosectomy, no gross lesion in esophagus, erythematous mucosa  Central access: PICC placed 06/05/19 TPN start date: 06/05/19  Nutritional Goals (per RD rec on 2/18): 2300-2500 kCal, 130-145g  protein, >2L fluid per day  Current Nutrition:  Heart diet:  10-15% of meals Ensure TID: 1 charted given  Plan:   Continue concentrated TPN at 50 ml/hr (goal rate 80 ml/hr).  Advance to goal pending CBGs and PO intake. TPN provides 99g AA, 204g CHO and 45g ILE for a total of 1499 kCal, meeting ~62% of patient needs Electrolytes in TPN: increase K (96 mEq/d), Mag (06/07/19 = ~2 gm/day) and Phos. Cl:Ac 1:1 for now Continue daily PO multivitamin (no multivitamin and trace elements in TPN) Change resistant SSI to Q4H + continue Lantus 10 units daily + increase regular insulin in TPN to 35 units KCL 3/18 PO TID x 3 today, f/u Lasix LOT   Mag sulfate 6gm IV x 1 Calcium carbonate daily per MD F/U AM labs, CBGs, ability to place feeding tube, necrosectomy on Mon and discharge nutrition plans  Emir Nack D. Wed, PharmD, BCPS, BCCCP 06/07/2019, 7:14 AM

## 2019-06-07 NOTE — Progress Notes (Signed)
[facility] was notified of patients morning potassium and Magnesium levels.  No new orders at this time.

## 2019-06-07 NOTE — H&P (View-Only) (Signed)
Subjective: Feeling well.  No complaints.  Objective: Vital signs in last 24 hours: Temp:  [97.5 F (36.4 C)-98.2 F (36.8 C)] 97.7 F (36.5 C) (02/21 1047) Pulse Rate:  [77-102] 77 (02/21 1047) Resp:  [12-18] 18 (02/21 0642) BP: (113-185)/(58-119) 153/86 (02/21 1047) SpO2:  [92 %-100 %] 92 % (02/21 1047) Weight:  [141.2 kg] 141.2 kg (02/21 0400) Last BM Date: 06/04/19  Intake/Output from previous day: 02/20 0701 - 02/21 0700 In: 1481.1 [P.O.:830; I.V.:101.1; IV Piggyback:550] Out: 2075 [Urine:2075] Intake/Output this shift: Total I/O In: 340 [P.O.:240; IV Piggyback:100] Out: 925 [Urine:925]  General appearance: alert and no distress GI: soft, non-tender; bowel sounds normal; no masses,  no organomegaly  Lab Results: Recent Labs    06/05/19 0306 06/06/19 0325 06/07/19 0412  WBC 6.3 4.7 4.8  HGB 8.0* 7.5* 7.7*  HCT 25.9* 24.8* 25.6*  PLT 387 341 348   BMET Recent Labs    06/05/19 0306 06/06/19 0325 06/07/19 0412  NA 140 138 137  K 3.2* 3.2* 3.3*  CL 108 106 107  CO2 23 25 23   GLUCOSE 165* 218* 257*  BUN <5* 5* 5*  CREATININE 0.70 0.74 0.55*  CALCIUM 7.8* 7.5* 7.7*   LFT Recent Labs    06/07/19 0412  PROT 6.1*  ALBUMIN 1.4*  AST 10*  ALT 10  ALKPHOS 55  BILITOT 0.3   PT/INR No results for input(s): LABPROT, INR in the last 72 hours. Hepatitis Panel No results for input(s): HEPBSAG, HCVAB, HEPAIGM, HEPBIGM in the last 72 hours. C-Diff No results for input(s): CDIFFTOX in the last 72 hours. Fecal Lactopherrin No results for input(s): FECLLACTOFRN in the last 72 hours.  Studies/Results: IR PICC PLACEMENT RIGHT >5 YRS INC IMG GUIDE  Result Date: 06/05/2019 INDICATION: Patient with history of malnutrition, request to IR for PICC placement for TPN. EXAM: RIGHT UPPER EXTREMITY PICC LINE PLACEMENT WITH ULTRASOUND AND FLUOROSCOPIC GUIDANCE MEDICATIONS: 3 mL 1% lidocaine. ANESTHESIA/SEDATION: None. FLUOROSCOPY TIME:  Fluoroscopy Time: 1 minutes 0  seconds (10.3 mGy). COMPLICATIONS: None immediate. PROCEDURE: The patient was advised of the possible risks and complications and agreed to undergo the procedure. The patient was then brought to the angiographic suite for the procedure. The right arm was prepped with chlorhexidine, draped in the usual sterile fashion using maximum barrier technique (cap and mask, sterile gown, sterile gloves, large sterile sheet, hand hygiene and cutaneous antisepsis) and infiltrated locally with 1% Lidocaine. Ultrasound demonstrated patency of the right brachial vein, and this was documented with an image. Under real-time ultrasound guidance, this vein was accessed with a 21 gauge micropuncture needle and image documentation was performed. A 0.018 wire was introduced in to the vein. Over this, a 5 06/07/2019 double lumen power injectable PICC was advanced to the lower SVC/right atrial junction. Fluoroscopy during the procedure and fluoro spot radiograph confirms appropriate catheter position. The catheter was aspirated, flushed, capped and covered with a sterile dressing. Catheter length: 38 cm IMPRESSION: Successful right arm power PICC line placement with ultrasound and fluoroscopic guidance. The catheter is ready for use. Read by [place], PA-C Electronically Signed   By: Lynnette Caffey.  Shick M.D.   On: 06/05/2019 12:42    Medications:  Scheduled: . sodium chloride   Intravenous Once  . allopurinol  300 mg Oral Daily  . calcium carbonate  1 tablet Oral Daily  . chlorhexidine  15 mL Mouth Rinse BID  . Chlorhexidine Gluconate Cloth  6 each Topical Daily  . diphenoxylate-atropine  2 tablet Oral BID  .  enoxaparin (LOVENOX) injection  40 mg Subcutaneous Q24H  . feeding supplement (ENSURE ENLIVE)  237 mL Oral TID BM  . furosemide  20 mg Intravenous BID  . insulin aspart  0-20 Units Subcutaneous Q4H  . insulin glargine  10 Units Subcutaneous Daily  . lipase/protease/amylase  36,000 Units Oral TID AC  . lisinopril  10 mg Oral  Daily  . morphine  30 mg Oral Q12H  . multivitamin with minerals  1 tablet Oral Daily  . pantoprazole  40 mg Oral Daily  . potassium chloride  40 mEq Oral TID   Continuous: . magnesium sulfate bolus IVPB 6 g (06/07/19 0952)  . metronidazole 500 mg (06/07/19 0543)  . TPN ADULT (ION) 50 mL/hr at 06/06/19 1754  . TPN ADULT (ION)    . vancomycin 1,250 mg (06/07/19 0358)    Assessment/Plan: 1) Infected pancreatic pseudocyst/WON. 2) Severe malnutrition.   The patient continues to clinically improve.  He feels well.  The S. Aureus is pansensitive and he will remain on vancomycin and metronidazole for the time being.  The patient is tolerating TPN.  His electrolytes and DM are being controlled.  Plan: 1) Continue with supportive care. 2) Repeat necrosectomy with Dr. Mansouraty tomorrow.    LOS: 26 days   Berneda Piccininni D 06/07/2019, 11:25 AM 

## 2019-06-07 NOTE — Progress Notes (Signed)
[name]  PROGRESS NOTE    Caleb Chambers  ZOX:[id number] DOB: [birth year] DOA: 05/12/2019 PCP: Patient, No Pcp Per   Brief Narrative:   54 year old African-American male, morbidly obese, with past medical history significant for hypertension,DM, gout; and recent admission for pancreatitis and DKA. Patient presented to the hospital with complaints of minimal oral intake since his discharge. Patient reported shortness of breath and decreased ability to eat and drink. He has not been fully compliant with insulin regimen as well. Patient was then admitted to the hospital again for DKA and acute kidney injury, prerenal. DKA and acute kidney injury have resolved. Patient is currently being managed for complications of severe pancreatitis.  06/07/19:Says he's had a rough night sleeping. Otherwise, he is ok. Spoke with pharm about K+/Mg2+. Working on through [place]. Increasing SSI. Hgb is ok. No evidence of further bleed. Continue current Tx.    Assessment & Plan:   Principal Problem:   DKA (diabetic ketoacidosis) ([place]) Active Problems:   Acute pancreatitis   Acute kidney injury ([place])   Morbid obesity ([place])   Debility   Protein-calorie malnutrition, severe   Pancreatic necrosis  Severe pancreatitis with peripancreatic fluid collection Necrotizing pancreatitis - questionable superimposed infection.  - Repeat CT abdomen/pelvis on 05/25/2019 showed persistent pancreatitis with new scattered peripancreatic gas, extensive ill-defined peripancreatic fluid extending throughout transverse colon into the root of mesentery. S/p cholecystectomy.  - GI was consulted; apprecaite assistance, 2 necrosectomies so far; possible repeat on Friday w/ Dr. Rush Landmark.  - Drainage from pancreatic pseudocyst growing moderate staph aureus, started on vancomycin; continue for now ; continue flagyl for now - 06/04/19:Continue abx, he reports improvement in pain and overall disposition - 06/05/19:  back from OR after another necrosectomy; doing ok, continuing abx     - 06/06/19: spoke with GI yesterday; will be on abx for at least 4 weeks, still awaiting sensitivities. Back to OR on Monday. Ab pain is way down and he is feeling well today. Needs to work with therapy and continue IS.      - 06/07/19: back to OR tomorrow for another necrosectomy  Recurrent DKA/diabetes mellitus type 2-resolved - Hemoglobin A1c was 13.2 on 04/22/2019.  -06/04/19:Lantus 10 units SQ daily; continue SSI; looks ok for now, will continue this dosage - 06/05/19: glucose is ok this AM, monitor     - 06/06/19: glucose is still acceptable, monitor     - 06/07/19: increasing SSI  History of hypertension - 06/05/19: BP is ok, monitor     - 06/06/19: BP is elevated, let's start back some of his home meds. Will start with lisinopril at 10mg  qday  Acute kidney injury - resovled, monitor - 06/05/19: started on lasix IV for edema, renal function is ok, monitor     - 06/05/19: renal function looks ok; starting back lisinopril, keep monitor  Microcytic anemia - no obvious bleeding - lab work suggestive of anemia of chronic disease in setting of inflammation. - received 1u pRBCs after previous necrocetomy, but did not have good response; he's 7.4 today. Follow Hgb. Transfuse for Hgb less than 7.0. - 06/04/19: receiving 1 u pRBCs today - 06/05/19: Hgb is 8.0 this AM, continue to monitor     - 06/06/19: labs pending     - 06/07/19: Hgb holdin g at 7.7. Monitor  Hyponatremia Hypomagnesemia Hypokalemia - Na+ is stable, monitor -06/04/19: Mg2+ is resolved, monitor - 06/05/19: K+ is low, replacing     - 06/06/19: K+, Mg2+ low, but replacing through TPN     -  06/07/19: K+/Mg2+ will be replaced through TPN; it is being increased today  BLE edema - TED hose - elevate legs when in chair/bed - 06/05/19: IV low dose lasix started     - 06/06/19: this is improving,  monitor     - 06/07/19: continues to improve; can probably do this another day then switch to his home bumex.  Debility - PT/OT  DVT prophylaxis: lovenox Code Status: FULL Family Communication: Not asking for family update.   Disposition Plan: Remains inpt d/t ongoing need for IV abx, and further necrosectomies  Consultants:   GI  Procedures:   Necrocestomy x 3  EGD  Antimicrobials:  . Vanc, flagyl   ROS:  Denies CP, N, V, dyspnea . Remainder 10-pt ROS is negative for all not previously mentioned.  Subjective: "I wish they would let me sleep."  Objective: Vitals:   06/07/19 0400 06/07/19 0642 06/07/19 0759 06/07/19 1047  BP:  (!) 147/81 (!) 141/68 (!) 153/86  Pulse:   99 77  Resp:  18    Temp:  98.2 F (36.8 C) 98.1 F (36.7 C) 97.7 F (36.5 C)  TempSrc:  Oral Oral Oral  SpO2:  98% 99% 92%  Weight: (!) 141.2 kg     Height:        Intake/Output Summary (Last 24 hours) at 06/07/2019 1330 Last data filed at 06/07/2019 1300 Gross per 24 hour  Intake 1150 ml  Output 3000 ml  Net -1850 ml   Filed Weights   06/05/19 0541 06/06/19 0500 06/07/19 0400  Weight: (!) 140.7 kg (!) 142.1 kg (!) 141.2 kg    Examination:  General: 54 y.o. male resting in bed in NAD Cardiovascular: RRR, +S1, S2, no m/g/r Respiratory: CTABL, no w/r/r, normal WOB GI: BS+, NDNT, no masses noted, no organomegaly noted MSK: No c/c; BLE edema is improved Neuro: Alert to name, follows commands Psyc: Appropriate interaction and affect, calm/cooperative   Data Reviewed: I have personally reviewed following labs and imaging studies.  CBC: Recent Labs  Lab 06/01/19 0244 06/02/19 0231 06/04/19 0319 06/04/19 1245 06/05/19 0306 06/06/19 0325 06/07/19 0412  WBC 11.7*   < > 5.7 8.5 6.3 4.7 4.8  NEUTROABS 9.7*  --  3.6  --  4.3 2.9 3.8  HGB 7.8*   < > 6.8* 8.8* 8.0* 7.5* 7.7*  HCT 25.8*   < > 22.5* 28.2* 25.9* 24.8* 25.6*  MCV 79.6*   < > 83.0 81.5 82.2 85.2 85.3  PLT 355   < >  140* 407* 387 341 348   < > = values in this interval not displayed.   Basic Metabolic Panel: Recent Labs  Lab 06/03/19 0608 06/04/19 0319 06/05/19 0306 06/06/19 0325 06/07/19 0412  NA 133* 136 140 138 137  K 3.5 3.9 3.2* 3.2* 3.3*  CL 104 107 108 106 107  CO2 20* 22 23 25 23   GLUCOSE 221* 193* 165* 218* 257*  BUN 8 6 <5* 5* 5*  CREATININE 0.77 0.86 0.70 0.74 0.55*  CALCIUM 7.6* 7.5* 7.8* 7.5* 7.7*  MG 1.4* 1.8 1.7 1.6* 1.6*  PHOS 2.5  --  3.7 3.3 2.9   GFR: Estimated Creatinine Clearance: 155.4 mL/min (A) (by C-G formula based on SCr of 0.55 mg/dL (L)). Liver Function Tests: Recent Labs  Lab 06/01/19 0244 06/02/19 0231 06/04/19 0319 06/05/19 0306 06/07/19 0412  AST 18 14* 25 13* 10*  ALT 13 12 14 13 10   ALKPHOS 66 59 68 59 55  BILITOT 1.3* 0.9  1.1 0.7 0.3  PROT 6.9 6.2* 5.9* 6.3* 6.1*  ALBUMIN 1.2* 1.3* 1.5* 1.4* 1.4*   No results for input(s): LIPASE, AMYLASE in the last 168 hours. No results for input(s): AMMONIA in the last 168 hours. Coagulation Profile: No results for input(s): INR, PROTIME in the last 168 hours. Cardiac Enzymes: No results for input(s): CKTOTAL, CKMB, CKMBINDEX, TROPONINI in the last 168 hours. BNP (last 3 results) No results for input(s): PROBNP in the last 8760 hours. HbA1C: No results for input(s): HGBA1C in the last 72 hours. CBG: Recent Labs  Lab 06/06/19 1131 06/06/19 1602 06/06/19 2115 06/07/19 0654 06/07/19 1051  GLUCAP 217* 185* 246* 294* 220*   Lipid Profile: Recent Labs    06/05/19 0306  TRIG 75   Thyroid Function Tests: No results for input(s): TSH, T4TOTAL, FREET4, T3FREE, THYROIDAB in the last 72 hours. Anemia Panel: No results for input(s): VITAMINB12, FOLATE, FERRITIN, TIBC, IRON, RETICCTPCT in the last 72 hours. Sepsis Labs: No results for input(s): PROCALCITON, LATICACIDVEN in the last 168 hours.  Recent Results (from the past 240 hour(s))  Aerobic/Anaerobic Culture (surgical/deep wound)     Status:  None (Preliminary result)   Collection Time: 05/28/19  1:58 PM   Specimen: PATH GI biopsy; Tissue  Result Value Ref Range Status   Specimen Description FLUID PANCREATIC PSUEDOCYST ASPIRATE  Final   Special Requests NONE  Final   Gram Stain   Final    ABUNDANT WBC PRESENT,BOTH PMN AND MONONUCLEAR ABUNDANT GRAM POSITIVE COCCI    Culture   Final    MODERATE STAPHYLOCOCCUS AUREUS FEW BACTEROIDES FRAGILIS BETA LACTAMASE POSITIVE Sent to [facility] for further susceptibility testing. FOR STAPH AUREUS Performed at [facility], [address] [place], [place] [address]    Report Status PENDING  Incomplete  Susceptibility, Aer + Anaerob     Status: Abnormal   Collection Time: 05/28/19  1:58 PM  Result Value Ref Range Status   Suscept, Aer + Anaerob Final report (A)  Corrected    Comment: (NOTE) Performed At: [facility] [address] [place], [place] [id number] Jolene Schimke MD NK:[id number] CORRECTED ON 02/20 AT 2035: PREVIOUSLY REPORTED AS Preliminary report    Source of Sample FLUID/ STAPH AUREUS  Corrected    Comment: Performed at [facility], [address], [place], [place] [address] CORRECTED ON 02/17 AT 1332: PREVIOUSLY REPORTED AS FLUID   Susceptibility Result     Status: Abnormal   Collection Time: 05/28/19  1:58 PM  Result Value Ref Range Status   Suscept Result 1 Staphylococcus aureus (A)  Final    Comment: (NOTE) Identification performed by account, not confirmed by this laboratory. Based on susceptibility to oxacillin this isolate would be susceptible to: *Penicillinase-stable penicillins, such as:  Cloxacillin, Dicloxacillin, Nafcillin *Beta-lactam combination agents, such as:  Amoxicillin-clavulanic acid, Ampicillin-sulbactam,  Piperacillin-tazobactam *Oral cephems, such as:  Cefaclor, Cefdinir, Cefpodoxime, Cefprozil, Cefuroxime,  Cephalexin, Loracarbef *Parenteral cephems, such as:  Cefazolin, Cefepime, Cefotaxime, Cefotetan,  Ceftaroline,  Ceftizoxime, Ceftriaxone, Cefuroxime *Carbapenems, such as:  Doripenem, Ertapenem, Imipenem, Meropenem Testing performed by broth microdilution.    Antimicrobial Suscept Comment  Corrected    Comment: (NOTE)      ** S = Susceptible; I = Intermediate; R = Resistant **                   P = Positive; N = Negative            MICS are expressed in micrograms per  mL   Antibiotic                 RSLT#1    RSLT#2    RSLT#3    RSLT#4 Ciprofloxacin                  S Clindamycin                    S Erythromycin                   S Gentamicin                     S Levofloxacin                   S Linezolid                      S Moxifloxacin                   S Oxacillin                      S Penicillin                     R Quinupristin/Dalfopristin      S Rifampin                       S Tetracycline                   S Trimethoprim/Sulfa             S Vancomycin                     S Performed At: [facility] [address] [place], [place] [id number] Jolene Schimke MD TK:[id number]       Radiology Studies: No results found.   Scheduled Meds: . sodium chloride   Intravenous Once  . allopurinol  300 mg Oral Daily  . calcium carbonate  1 tablet Oral Daily  . chlorhexidine  15 mL Mouth Rinse BID  . Chlorhexidine Gluconate Cloth  6 each Topical Daily  . diphenoxylate-atropine  2 tablet Oral BID  . enoxaparin (LOVENOX) injection  40 mg Subcutaneous Q24H  . feeding supplement (ENSURE ENLIVE)  237 mL Oral TID BM  . furosemide  20 mg Intravenous BID  . insulin aspart  0-20 Units Subcutaneous Q4H  . insulin glargine  10 Units Subcutaneous Daily  . lipase/protease/amylase  36,000 Units Oral TID AC  . lisinopril  10 mg Oral Daily  . morphine  30 mg Oral Q12H  . multivitamin with minerals  1 tablet Oral Daily  . pantoprazole  40 mg Oral Daily  . potassium chloride  40 mEq Oral TID   Continuous Infusions: . metronidazole 500 mg (06/07/19 0543)  . TPN  ADULT (ION) 50 mL/hr at 06/06/19 1754  . TPN ADULT (ION)    . vancomycin 1,250 mg (06/07/19 0358)     LOS: 26 days    Time spent: 25 minutes spent in the coordination of care today.    Teddy Spike, DO [facility]  If 7PM-7AM, please contact night-coverage [email or web address] 06/07/2019, 1:30 PM

## 2019-06-07 NOTE — Progress Notes (Signed)
Subjective: Feeling well.  No complaints.  Objective: Vital signs in last 24 hours: Temp:  [97.5 F (36.4 C)-98.2 F (36.8 C)] 97.7 F (36.5 C) (02/21 1047) Pulse Rate:  [77-102] 77 (02/21 1047) Resp:  [12-18] 18 (02/21 0642) BP: (113-185)/(58-119) 153/86 (02/21 1047) SpO2:  [92 %-100 %] 92 % (02/21 1047) Weight:  [141.2 kg] 141.2 kg (02/21 0400) Last BM Date: 06/04/19  Intake/Output from previous day: 02/20 0701 - 02/21 0700 In: 1481.1 [P.O.:830; I.V.:101.1; IV Piggyback:550] Out: 2075 [Urine:2075] Intake/Output this shift: Total I/O In: 340 [P.O.:240; IV Piggyback:100] Out: 925 [Urine:925]  General appearance: alert and no distress GI: soft, non-tender; bowel sounds normal; no masses,  no organomegaly  Lab Results: Recent Labs    06/05/19 0306 06/06/19 0325 06/07/19 0412  WBC 6.3 4.7 4.8  HGB 8.0* 7.5* 7.7*  HCT 25.9* 24.8* 25.6*  PLT 387 341 348   BMET Recent Labs    06/05/19 0306 06/06/19 0325 06/07/19 0412  NA 140 138 137  K 3.2* 3.2* 3.3*  CL 108 106 107  CO2 23 25 23   GLUCOSE 165* 218* 257*  BUN <5* 5* 5*  CREATININE 0.70 0.74 0.55*  CALCIUM 7.8* 7.5* 7.7*   LFT Recent Labs    06/07/19 0412  PROT 6.1*  ALBUMIN 1.4*  AST 10*  ALT 10  ALKPHOS 55  BILITOT 0.3   PT/INR No results for input(s): LABPROT, INR in the last 72 hours. Hepatitis Panel No results for input(s): HEPBSAG, HCVAB, HEPAIGM, HEPBIGM in the last 72 hours. C-Diff No results for input(s): CDIFFTOX in the last 72 hours. Fecal Lactopherrin No results for input(s): FECLLACTOFRN in the last 72 hours.  Studies/Results: IR PICC PLACEMENT RIGHT >5 YRS INC IMG GUIDE  Result Date: 06/05/2019 INDICATION: Patient with history of malnutrition, request to IR for PICC placement for TPN. EXAM: RIGHT UPPER EXTREMITY PICC LINE PLACEMENT WITH ULTRASOUND AND FLUOROSCOPIC GUIDANCE MEDICATIONS: 3 mL 1% lidocaine. ANESTHESIA/SEDATION: None. FLUOROSCOPY TIME:  Fluoroscopy Time: 1 minutes 0  seconds (10.3 mGy). COMPLICATIONS: None immediate. PROCEDURE: The patient was advised of the possible risks and complications and agreed to undergo the procedure. The patient was then brought to the angiographic suite for the procedure. The right arm was prepped with chlorhexidine, draped in the usual sterile fashion using maximum barrier technique (cap and mask, sterile gown, sterile gloves, large sterile sheet, hand hygiene and cutaneous antisepsis) and infiltrated locally with 1% Lidocaine. Ultrasound demonstrated patency of the right brachial vein, and this was documented with an image. Under real-time ultrasound guidance, this vein was accessed with a 21 gauge micropuncture needle and image documentation was performed. A 0.018 wire was introduced in to the vein. Over this, a 5 06/07/2019 double lumen power injectable PICC was advanced to the lower SVC/right atrial junction. Fluoroscopy during the procedure and fluoro spot radiograph confirms appropriate catheter position. The catheter was aspirated, flushed, capped and covered with a sterile dressing. Catheter length: 38 cm IMPRESSION: Successful right arm power PICC line placement with ultrasound and fluoroscopic guidance. The catheter is ready for use. Read by [place], PA-C Electronically Signed   By: Lynnette Caffey.  Shick M.D.   On: 06/05/2019 12:42    Medications:  Scheduled: . sodium chloride   Intravenous Once  . allopurinol  300 mg Oral Daily  . calcium carbonate  1 tablet Oral Daily  . chlorhexidine  15 mL Mouth Rinse BID  . Chlorhexidine Gluconate Cloth  6 each Topical Daily  . diphenoxylate-atropine  2 tablet Oral BID  .  enoxaparin (LOVENOX) injection  40 mg Subcutaneous Q24H  . feeding supplement (ENSURE ENLIVE)  237 mL Oral TID BM  . furosemide  20 mg Intravenous BID  . insulin aspart  0-20 Units Subcutaneous Q4H  . insulin glargine  10 Units Subcutaneous Daily  . lipase/protease/amylase  36,000 Units Oral TID AC  . lisinopril  10 mg Oral  Daily  . morphine  30 mg Oral Q12H  . multivitamin with minerals  1 tablet Oral Daily  . pantoprazole  40 mg Oral Daily  . potassium chloride  40 mEq Oral TID   Continuous: . magnesium sulfate bolus IVPB 6 g (06/07/19 0952)  . metronidazole 500 mg (06/07/19 0543)  . TPN ADULT (ION) 50 mL/hr at 06/06/19 1754  . TPN ADULT (ION)    . vancomycin 1,250 mg (06/07/19 0358)    Assessment/Plan: 1) Infected pancreatic pseudocyst/WON. 2) Severe malnutrition.   The patient continues to clinically improve.  He feels well.  The S. Aureus is pansensitive and he will remain on vancomycin and metronidazole for the time being.  The patient is tolerating TPN.  His electrolytes and DM are being controlled.  Plan: 1) Continue with supportive care. 2) Repeat necrosectomy with Dr. Rush Landmark tomorrow.    LOS: 26 days   Marnee Sherrard D 06/07/2019, 11:25 AM

## 2019-06-08 ENCOUNTER — Encounter (HOSPITAL_COMMUNITY)
Admission: EM | Disposition: A | Discharge status: Home Health | Payer: Self-pay | Source: Home / Self Care | Attending: Internal Medicine

## 2019-06-08 ENCOUNTER — Encounter (HOSPITAL_COMMUNITY): Payer: Self-pay | Admitting: Internal Medicine

## 2019-06-08 ENCOUNTER — Inpatient Hospital Stay (HOSPITAL_COMMUNITY): Payer: BC Managed Care – PPO | Admitting: Certified Registered Nurse Anesthetist

## 2019-06-08 HISTORY — PX: ENDOROTOR: SHX6859

## 2019-06-08 HISTORY — PX: ESOPHAGOGASTRODUODENOSCOPY: SHX5428

## 2019-06-08 HISTORY — PX: STENT REMOVAL: SHX6421

## 2019-06-08 HISTORY — PX: PANCREATIC STENT PLACEMENT: SHX5539

## 2019-06-08 LAB — COMPREHENSIVE METABOLIC PANEL
ALT: 9 U/L (ref 0–44)
AST: 9 U/L — ABNORMAL LOW (ref 15–41)
Albumin: 1.5 g/dL — ABNORMAL LOW (ref 3.5–5.0)
Alkaline Phosphatase: 52 U/L (ref 38–126)
Anion gap: 8 (ref 5–15)
BUN: 6 mg/dL (ref 6–20)
CO2: 24 mmol/L (ref 22–32)
Calcium: 7.8 mg/dL — ABNORMAL LOW (ref 8.9–10.3)
Chloride: 106 mmol/L (ref 98–111)
Creatinine, Ser: 0.64 mg/dL (ref 0.61–1.24)
GFR calc Af Amer: >60 mL/min (ref >60)
GFR calc non Af Amer: >60 mL/min (ref >60)
Glucose, Bld: 193 mg/dL — ABNORMAL HIGH (ref 70–99)
Potassium: 3.6 mmol/L (ref 3.5–5.1)
Sodium: 138 mmol/L (ref 135–145)
Total Bilirubin: 0.2 mg/dL — ABNORMAL LOW (ref 0.3–1.2)
Total Protein: 6.5 g/dL (ref 6.5–8.1)

## 2019-06-08 LAB — CBC
HCT: 25.9 % — ABNORMAL LOW (ref 39.0–52.0)
Hemoglobin: 7.9 g/dL — ABNORMAL LOW (ref 13.0–17.0)
MCH: 25.7 pg — ABNORMAL LOW (ref 26.0–34.0)
MCHC: 30.5 g/dL (ref 30.0–36.0)
MCV: 84.4 fL (ref 80.0–100.0)
Platelets: 321 10*3/uL (ref 150–400)
RBC: 3.07 MIL/uL — ABNORMAL LOW (ref 4.22–5.81)
RDW: 20.9 % — ABNORMAL HIGH (ref 11.5–15.5)
WBC: 5.2 10*3/uL (ref 4.0–10.5)
nRBC: 0.4 % — ABNORMAL HIGH (ref 0.0–0.2)

## 2019-06-08 LAB — GLUCOSE, CAPILLARY
Glucose-Capillary: 175 mg/dL — ABNORMAL HIGH (ref 70–99)
Glucose-Capillary: 154 mg/dL — ABNORMAL HIGH (ref 70–99)
Glucose-Capillary: 176 mg/dL — ABNORMAL HIGH (ref 70–99)
Glucose-Capillary: 168 mg/dL — ABNORMAL HIGH (ref 70–99)
Glucose-Capillary: 180 mg/dL — ABNORMAL HIGH (ref 70–99)
Glucose-Capillary: 193 mg/dL — ABNORMAL HIGH (ref 70–99)

## 2019-06-08 LAB — DIFFERENTIAL
Abs Immature Granulocytes: 0.01 10*3/uL (ref 0.00–0.07)
Basophils Absolute: 0 10*3/uL (ref 0.0–0.1)
Basophils Relative: 0 %
Eosinophils Absolute: 0.1 10*3/uL (ref 0.0–0.5)
Eosinophils Relative: 1 %
Immature Granulocytes: 0 %
Lymphocytes Relative: 32 %
Lymphs Abs: 1.7 10*3/uL (ref 0.7–4.0)
Monocytes Absolute: 0.4 10*3/uL (ref 0.1–1.0)
Monocytes Relative: 8 %
Neutro Abs: 3 10*3/uL (ref 1.7–7.7)
Neutrophils Relative %: 59 %

## 2019-06-08 LAB — MAGNESIUM: Magnesium: 1.7 mg/dL (ref 1.7–2.4)

## 2019-06-08 LAB — PREALBUMIN: Prealbumin: 8.6 mg/dL — ABNORMAL LOW (ref 18–38)

## 2019-06-08 LAB — PHOSPHORUS: Phosphorus: 3.1 mg/dL (ref 2.5–4.6)

## 2019-06-08 LAB — TRIGLYCERIDES: Triglycerides: 89 mg/dL (ref <150)

## 2019-06-08 SURGERY — EGD (ESOPHAGOGASTRODUODENOSCOPY)
Anesthesia: General

## 2019-06-08 MED ORDER — ROCURONIUM BROMIDE 100 MG/10ML IV SOLN
INTRAVENOUS | Status: DC | PRN
  Administered 2019-06-08: 60 mg via INTRAVENOUS

## 2019-06-08 MED ORDER — ONDANSETRON HCL 4 MG/2ML IJ SOLN
INTRAMUSCULAR | Status: DC | PRN
  Administered 2019-06-08: 4 mg via INTRAVENOUS

## 2019-06-08 MED ORDER — PROPOFOL 10 MG/ML IV BOLUS
INTRAVENOUS | Status: DC | PRN
  Administered 2019-06-08: 150 mg via INTRAVENOUS

## 2019-06-08 MED ORDER — LISINOPRIL 20 MG PO TABS
20.0000 mg | ORAL_TABLET | Freq: Every day | ORAL | Status: DC
  Administered 2019-06-08 – 2019-06-22 (×15): 20 mg via ORAL
  Filled 2019-06-08 (×15): qty 1

## 2019-06-08 MED ORDER — SODIUM CHLORIDE 0.9 % IV SOLN: INTRAVENOUS | Status: DC

## 2019-06-08 MED ORDER — POTASSIUM CHLORIDE 10 MEQ/50ML IV SOLN
10.0000 meq | INTRAVENOUS | Status: AC
  Administered 2019-06-08 (×4): 10 meq via INTRAVENOUS
  Filled 2019-06-08 (×4): qty 50

## 2019-06-08 MED ORDER — PRO-STAT SUGAR FREE PO LIQD
30.0000 mL | Freq: Three times a day (TID) | ORAL | Status: DC
  Filled 2019-06-08 (×13): qty 30

## 2019-06-08 MED ORDER — SUCCINYLCHOLINE CHLORIDE 20 MG/ML IJ SOLN
INTRAMUSCULAR | Status: DC | PRN
  Administered 2019-06-08: 140 mg via INTRAVENOUS

## 2019-06-08 MED ORDER — SUGAMMADEX SODIUM 200 MG/2ML IV SOLN
INTRAVENOUS | Status: DC | PRN
  Administered 2019-06-08: 300 mg via INTRAVENOUS

## 2019-06-08 MED ORDER — FENTANYL CITRATE (PF) 100 MCG/2ML IJ SOLN
INTRAMUSCULAR | Status: AC
  Filled 2019-06-08: qty 2

## 2019-06-08 MED ORDER — HYDRALAZINE HCL 20 MG/ML IJ SOLN
10.0000 mg | Freq: Three times a day (TID) | INTRAMUSCULAR | Status: DC | PRN
  Administered 2019-06-09: 10 mg via INTRAVENOUS
  Filled 2019-06-08 (×3): qty 1

## 2019-06-08 MED ORDER — LIDOCAINE HCL (CARDIAC) PF 100 MG/5ML IV SOSY
PREFILLED_SYRINGE | INTRAVENOUS | Status: DC | PRN
  Administered 2019-06-08: 60 mg via INTRAVENOUS

## 2019-06-08 MED ORDER — FENTANYL CITRATE (PF) 100 MCG/2ML IJ SOLN
INTRAMUSCULAR | Status: DC | PRN
  Administered 2019-06-08 (×2): 50 ug via INTRAVENOUS

## 2019-06-08 MED ORDER — LACTATED RINGERS IV SOLN: INTRAVENOUS | Status: DC | PRN

## 2019-06-08 MED ORDER — STERILE WATER FOR INJECTION IV SOLN
INTRAVENOUS | Status: AC
  Filled 2019-06-08: qty 947.2

## 2019-06-08 NOTE — Interval H&P Note (Signed)
History and Physical Interval Note:  06/08/2019 2:03 PM  Caleb Chambers  has presented today for surgery, with the diagnosis of Pancreatic Necrosis.  The various methods of treatment have been discussed with the patient and family. After consideration of risks, benefits and other options for treatment, the patient has consented to  Procedure(s): ESOPHAGOGASTRODUODENOSCOPY (EGD) with NECROSECTOMY (N/A) as a surgical intervention.  The patient's history has been reviewed, patient examined, no change in status, stable for surgery.  I have reviewed the patient's chart and labs.  Questions were answered to the patient's satisfaction.     [employer]

## 2019-06-08 NOTE — Progress Notes (Signed)
Physical Therapy Cancellation Note   06/08/19 1430  PT Visit Information  Last PT Received On 06/08/19  Reason Eval/Treat Not Completed Patient at procedure or test/unavailable. PT will continue to follow acutely.   Erline Levine, PTA Acute Rehabilitation Services Pager: [phone] Office: [phone]

## 2019-06-08 NOTE — Progress Notes (Signed)
Pharmacy Antibiotic Note  Caleb Chambers is a 54 y.o. male with necrotizing pancreatitis and pancreatic pseudocyst. Pharmacy dosing vancomycin (noted with PCN allergy; no record of tolerance to beta-lactams) for staph aureus in pseudocyst aspirate cultures (he is also on flagyl for bacteroides fragilis in the same cultures). Plans noted for 4 weeks antibiotics -SCr= 0.6, WBC= 5, afebrile   Plan: -Continue vancomycin to 1250 mg IV q12h; estimated AUC= 516 -Will check vancomycin levels this week    Height: 5\' 11"  (180.3 cm) Weight: (!) 310 lb 3 oz (140.7 kg) IBW/kg (Calculated) : 75.3  Temp (24hrs), Avg:98.3 F (36.8 C), Min:97.9 F (36.6 C), Max:98.7 F (37.1 C)  Recent Labs  Lab 06/04/19 0319 06/04/19 0319 06/04/19 1245 06/05/19 0306 06/05/19 1330 06/06/19 0325 06/07/19 0412 06/08/19 0404  WBC 5.7   < > 8.5 6.3  --  4.7 4.8 5.2  CREATININE 0.86  --   --  0.70  --  0.74 0.55* 0.64  VANCOTROUGH  --   --   --   --  18  --   --   --   VANCOPEAK  --   --   --  40  --   --   --   --    < > = values in this interval not displayed.    Estimated Creatinine Clearance: 155.1 mL/min (by C-G formula based on SCr of 0.64 mg/dL).    Allergies  Allergen Reactions  . Penicillins Anaphylaxis, Hives and Swelling    Did it involve swelling of the face/tongue/throat, SOB, or low BP? Yes Did it involve sudden or severe rash/hives, skin peeling, or any reaction on the inside of your mouth or nose? Yes Did you need to seek medical attention at a hospital or doctor's office? Yes When did it last happen?teenager If all above answers are "NO", may proceed with cephalosporin use.  . Metoprolol Other (See Comments)    Chest pains - reported by [facility] and  [facility] 02/08/14  . Clindamycin Rash and Other (See Comments)    Unknown reaction - reported by [facility] and [facility] 09-30-13    Vanc 2/12 >> Cipro 2/8 >> 2/16 Flagyl 2/8 >>2/15; 2/16>>  1/26 covid - negative 1/27 MRSA PCR -  negative 2/8 C.diff - negative 2/11 pancreatic pseudocyst aspirate - Staph aureus (preliminary), bacteroides   4/11, PharmD Clinical Pharmacist **Pharmacist phone directory can now be found on [email or web address] ([email or web address]).  Listed under [facility].

## 2019-06-08 NOTE — Plan of Care (Signed)

## 2019-06-08 NOTE — Progress Notes (Signed)
OT Cancellation Note  Patient Details Name: Caleb Chambers MRN: [id number] DOB: 12/05/1965   Cancelled Treatment:    Reason Eval/Treat Not Completed: Patient declined, no reason specified;Patient at procedure or test/ unavailable(pt declined, but had a procedure within an hour)   Pt stating "I'm tired; I just bathed at the sink." OTR confirmed with RN that pt had not just bathed at sink, but was leaving for his procedure in 30 minutes. OT to continue to follow.  Flora Lipps, OTR/L Acute Rehabilitation Services Pager: [phone] Office: [phone]   Lonzo Cloud 06/08/2019, 5:42 PM

## 2019-06-08 NOTE — Transfer of Care (Signed)
Immediate Anesthesia Transfer of Care Note  Patient: Caleb Chambers  Procedure(s) Performed: ESOPHAGOGASTRODUODENOSCOPY (EGD) with NECROSECTOMY (N/A ) ENDOROTOR STENT REMOVAL PANCREATIC STENT PLACEMENT  Patient Location: PACU and Endoscopy Unit  Anesthesia Type:General  Level of Consciousness: awake, alert  and oriented  Airway & Oxygen Therapy: Patient Spontanous Breathing and Patient connected to nasal cannula oxygen  Post-op Assessment: Report given to RN, Post -op Vital signs reviewed and stable and Patient moving all extremities  Post vital signs: Reviewed and stable  Last Vitals:  Vitals Value Taken Time  BP 187/89 06/08/19 1701  Temp 37.1 C 06/08/19 1701  Pulse 100 06/08/19 1706  Resp 16 06/08/19 1706  SpO2 99 % 06/08/19 1706  Vitals shown include unvalidated device data.  Last Pain:  Vitals:   06/08/19 1701  TempSrc: Oral  PainSc: 0-No pain      Patients Stated Pain Goal: 2 (06/04/19 1200)  Complications: No apparent anesthesia complications

## 2019-06-08 NOTE — Plan of Care (Signed)
  Problem: Education: Goal: Ability to describe self-care measures that may prevent or decrease complications (Diabetes Survival Skills Education) will improve Outcome: Progressing Goal: Individualized Educational Video(s) Outcome: Progressing   Problem: Cardiac: Goal: Ability to maintain an adequate cardiac output will improve Outcome: Progressing   Problem: Health Behavior/Discharge Planning: Goal: Ability to identify and utilize available resources and services will improve Outcome: Progressing Goal: Ability to manage health-related needs will improve Outcome: Progressing   Problem: Fluid Volume: Goal: Ability to achieve a balanced intake and output will improve Outcome: Progressing   Problem: Metabolic: Goal: Ability to maintain appropriate glucose levels will improve Outcome: Progressing   Problem: Nutritional: Goal: Maintenance of adequate nutrition will improve Outcome: Progressing Goal: Maintenance of adequate weight for body size and type will improve Outcome: Progressing   Problem: Respiratory: Goal: Will regain and/or maintain adequate ventilation Outcome: Progressing   Problem: Urinary Elimination: Goal: Ability to achieve and maintain adequate renal perfusion and functioning will improve Outcome: Progressing   Problem: Education: Goal: Knowledge of General Education information will improve Description: Including pain rating scale, medication(s)/side effects and non-pharmacologic comfort measures Outcome: Progressing   Problem: Health Behavior/Discharge Planning: Goal: Ability to manage health-related needs will improve Outcome: Progressing   Problem: Clinical Measurements: Goal: Ability to maintain clinical measurements within normal limits will improve Outcome: Progressing Goal: Will remain free from infection Outcome: Progressing Goal: Diagnostic test results will improve Outcome: Progressing Goal: Respiratory complications will improve Outcome:  Progressing Goal: Cardiovascular complication will be avoided Outcome: Progressing   Problem: Activity: Goal: Risk for activity intolerance will decrease Outcome: Progressing   Problem: Nutrition: Goal: Adequate nutrition will be maintained Outcome: Progressing   Problem: Coping: Goal: Level of anxiety will decrease Outcome: Progressing   Problem: Elimination: Goal: Will not experience complications related to bowel motility Outcome: Progressing Goal: Will not experience complications related to urinary retention Outcome: Progressing   Problem: Pain Managment: Goal: General experience of comfort will improve Outcome: Progressing   Problem: Safety: Goal: Ability to remain free from injury will improve Outcome: Progressing   Problem: Skin Integrity: Goal: Risk for impaired skin integrity will decrease Outcome: Progressing   

## 2019-06-08 NOTE — Progress Notes (Addendum)
[name]  PROGRESS NOTE    Caleb Chambers  JXB:[id number] DOB: [birth year] DOA: 05/12/2019 PCP: Patient, No Pcp Per   Brief Narrative:   54 year old African-American male, morbidly obese, with past medical history significant for hypertension,DM, gout; and recent admission for pancreatitis and DKA. Patient presented to the hospital with complaints of minimal oral intake since his discharge. Patient reported shortness of breath and decreased ability to eat and drink. He has not been fully compliant with insulin regimen as well. Patient was then admitted to the hospital again for DKA and acute kidney injury, prerenal. DKA and acute kidney injury have resolved. Patient is currently being managed for complications of severe pancreatitis.  06/08/19:Refused lasix this AM. Says he's legs are better. Back to OR today for necrosectomy. Appreciate GI assistance. Continue TPN.  K+ and Mg2+ improving.  UPDATE: Spoke with GI. Can likely transition to PO abx tomorrow. 4 week total course starting from 05/28/19. Will likely go with augmentin. Wil confirm with pharm/ID whether or not it will cover bacteroides as well. Will need at least one more procedure (likely Friday). TPN till then.    Assessment & Plan:   Principal Problem:   DKA (diabetic ketoacidosis) ([place]) Active Problems:   Acute pancreatitis   Acute kidney injury ([place])   Morbid obesity ([place])   Debility   Protein-calorie malnutrition, severe   Pancreatic necrosis  Severe pancreatitis with peripancreatic fluid collection Necrotizing pancreatitis - questionable superimposed infection.  - Repeat CT abdomen/pelvis on 05/25/2019 showed persistent pancreatitis with new scattered peripancreatic gas, extensive ill-defined peripancreatic fluid extending throughout transverse colon into the root of mesentery. S/p cholecystectomy.  - GI was consulted; apprecaite assistance, 2 necrosectomies so far; possible repeat on Friday w/ Dr.  Rush Landmark.  - Drainage from pancreatic pseudocyst growing moderate staph aureus, started on vancomycin; continue for now ; continue flagyl for now - 06/04/19:Continue abx, he reports improvement in pain and overall disposition - 06/05/19: back from OR after another necrosectomy; doing ok, continuing abx - 06/06/19: spoke with GI yesterday; will be on abx for at least 4 weeks, still awaiting sensitivities. Back to OR on Monday. Ab pain is way down and he is feeling well today. Needs to work with therapy and continue IS.     - 06/08/19: back to OR today for necrosectomy  Recurrent DKA/diabetes mellitus type 2-resolved - Hemoglobin A1c was 13.2 on 04/22/2019.  -06/04/19:Lantus 10 units SQ daily; continue SSI; looks ok for now, will continue this dosage - 06/05/19: glucose is ok this AM, monitor - 06/06/19: glucose is still acceptable, monitor     - 06/07/19: increasing SSI     - 06/08/19: glucoses are better; monitor  History of hypertension - 06/05/19: BP is ok, monitor - 06/06/19: BP is elevated, let's start back some of his home meds. Will start with lisinopril at 10mg  qday     - 06/08/19: increase lisinopril to 20mg  qday  Acute kidney injury - resovled, monitor - 06/05/19: started on lasix IV for edema, renal function is ok, monitor - 06/05/19: renal function looks ok; starting back lisinopril, keep monitor     - 06/08/19: renal function looks good this AM  Microcytic anemia - no obvious bleeding - lab work suggestive of anemia of chronic disease in setting of inflammation. - received 1u pRBCs after previous necrocetomy, but did not have good response; he's 7.4 today. Follow Hgb. Transfuse for Hgb less than 7.0. - 06/04/19: receiving 1 u pRBCs today - 06/05/19: Hgb is 8.0 this AM,  continue to monitor - 06/06/19: labs pending     - 06/07/19: Hgb holdin g at 7.7. Monitor     - 06/08/19: Hgb 7.9;  stable  Hyponatremia Hypomagnesemia Hypokalemia - Na+ is stable, monitor -06/04/19: Mg2+ is resolved, monitor - 06/05/19: K+ is low, replacing - 06/06/19: K+, Mg2+ low, but replacing through TPN     - 06/07/19: K+/Mg2+ will be replaced through TPN; it is being increased today     - 06/08/19: Mg2+/K+ is ok, monitor  BLE edema - TED hose - elevate legs when in chair/bed - 06/05/19: IV low dose lasix started - 06/06/19: this is improving, monitor     - 06/07/19: continues to improve; can probably do this another day then switch to his home bumex.     - 06/08/19: can start back bumex today  Debility - PT/OT  DVT prophylaxis: lovenox Code Status: FULL Family Communication: Not asking for family update   Disposition Plan: Remains inpt d/t ongoing need for IV abx and necrosectomies  Consultants:   GI  Procedures:   Necrosectomy x 3  Antimicrobials:  . Vanc, flagyl   ROS:  Denies CP, N, V, ab pain, dyspnea . Remainder 10-pt ROS is negative for all not previously mentioned.  Subjective: "They are feeling much better."  Objective: Vitals:   06/07/19 1744 06/07/19 1938 06/07/19 2308 06/08/19 0336  BP: (!) 149/78 (!) 156/92 (!) 150/94 (!) 152/92  Pulse: 68  94 92  Resp:  20 14 16   Temp: 98 F (36.7 C) 97.9 F (36.6 C) 98.2 F (36.8 C) 98.5 F (36.9 C)  TempSrc: Oral Oral Oral Oral  SpO2: 100% 98% 98% 100%  Weight:    (!) 140.7 kg  Height:        Intake/Output Summary (Last 24 hours) at 06/08/2019 06/10/2019 Last data filed at 06/08/2019 06/10/2019 Gross per 24 hour  Intake 2152.55 ml  Output 3575 ml  Net -1422.45 ml   Filed Weights   06/06/19 0500 06/07/19 0400 06/08/19 0336  Weight: (!) 142.1 kg (!) 141.2 kg (!) 140.7 kg    Examination:  General: 54 y.o. male resting in bed in NAD Cardiovascular: RRR, +S1, S2, no m/g/r, equal pulses throughout Respiratory: CTABL, no w/r/r, normal WOB GI: BS+, NDNT, no masses noted, no organomegaly  noted, obese MSK: No c/c; BLE edema is continuing to improve Neuro: A&O x 3, no focal deficits Psyc: Appropriate interaction and affect, calm/cooperative   Data Reviewed: I have personally reviewed following labs and imaging studies.  CBC: Recent Labs  Lab 06/04/19 0319 06/04/19 0319 06/04/19 1245 06/05/19 0306 06/06/19 0325 06/07/19 0412 06/08/19 0404  WBC 5.7   < > 8.5 6.3 4.7 4.8 5.2  NEUTROABS 3.6  --   --  4.3 2.9 3.8 3.0  HGB 6.8*   < > 8.8* 8.0* 7.5* 7.7* 7.9*  HCT 22.5*   < > 28.2* 25.9* 24.8* 25.6* 25.9*  MCV 83.0   < > 81.5 82.2 85.2 85.3 84.4  PLT 140*   < > 407* 387 341 348 321   < > = values in this interval not displayed.   Basic Metabolic Panel: Recent Labs  Lab 06/03/19 0608 06/03/19 06/05/19 06/04/19 0319 06/05/19 0306 06/06/19 0325 06/07/19 0412 06/08/19 0404  NA 133*   < > 136 140 138 137 138  K 3.5   < > 3.9 3.2* 3.2* 3.3* 3.6  CL 104   < > 107 108 106 107 106  CO2 20*   < >  22 23 25 23 24   GLUCOSE 221*   < > 193* 165* 218* 257* 193*  BUN 8   < > 6 <5* 5* 5* 6  CREATININE 0.77   < > 0.86 0.70 0.74 0.55* 0.64  CALCIUM 7.6*   < > 7.5* 7.8* 7.5* 7.7* 7.8*  MG 1.4*   < > 1.8 1.7 1.6* 1.6* 1.7  PHOS 2.5  --   --  3.7 3.3 2.9 3.1   < > = values in this interval not displayed.   GFR: Estimated Creatinine Clearance: 155.1 mL/min (by C-G formula based on SCr of 0.64 mg/dL). Liver Function Tests: Recent Labs  Lab 06/02/19 0231 06/04/19 0319 06/05/19 0306 06/07/19 0412 06/08/19 0404  AST 14* 25 13* 10* 9*  ALT 12 14 13 10 9   ALKPHOS 59 68 59 55 52  BILITOT 0.9 1.1 0.7 0.3 0.2*  PROT 6.2* 5.9* 6.3* 6.1* 6.5  ALBUMIN 1.3* 1.5* 1.4* 1.4* 1.5*   No results for input(s): LIPASE, AMYLASE in the last 168 hours. No results for input(s): AMMONIA in the last 168 hours. Coagulation Profile: No results for input(s): INR, PROTIME in the last 168 hours. Cardiac Enzymes: No results for input(s): CKTOTAL, CKMB, CKMBINDEX, TROPONINI in the last 168  hours. BNP (last 3 results) No results for input(s): PROBNP in the last 8760 hours. HbA1C: No results for input(s): HGBA1C in the last 72 hours. CBG: Recent Labs  Lab 06/07/19 1051 06/07/19 1651 06/07/19 1940 06/07/19 2312 06/08/19 0335  GLUCAP 220* 250* 229* 196* 175*   Lipid Profile: Recent Labs    06/08/19 0404  TRIG 89   Thyroid Function Tests: No results for input(s): TSH, T4TOTAL, FREET4, T3FREE, THYROIDAB in the last 72 hours. Anemia Panel: No results for input(s): VITAMINB12, FOLATE, FERRITIN, TIBC, IRON, RETICCTPCT in the last 72 hours. Sepsis Labs: No results for input(s): PROCALCITON, LATICACIDVEN in the last 168 hours.  No results found for this or any previous visit (from the past 240 hour(s)).    Radiology Studies: No results found.   Scheduled Meds: . sodium chloride   Intravenous Once  . allopurinol  300 mg Oral Daily  . calcium carbonate  1 tablet Oral Daily  . chlorhexidine  15 mL Mouth Rinse BID  . Chlorhexidine Gluconate Cloth  6 each Topical Daily  . diphenoxylate-atropine  2 tablet Oral BID  . enoxaparin (LOVENOX) injection  40 mg Subcutaneous Q24H  . feeding supplement (ENSURE ENLIVE)  237 mL Oral TID BM  . furosemide  20 mg Intravenous BID  . insulin aspart  0-20 Units Subcutaneous Q4H  . insulin glargine  10 Units Subcutaneous Daily  . lipase/protease/amylase  36,000 Units Oral TID AC  . lisinopril  10 mg Oral Daily  . morphine  30 mg Oral Q12H  . multivitamin with minerals  1 tablet Oral Daily  . pantoprazole  40 mg Oral Daily   Continuous Infusions: . sodium chloride Stopped (06/08/19 0624)  . metronidazole 500 mg (06/08/19 0624)  . TPN ADULT (ION) 50 mL/hr at 06/08/19 0624  . vancomycin Stopped (06/08/19 0532)     LOS: 27 days    Time spent: 25 minutes spent in the coordination of care today.    06/10/19, DO [facility]  If 7PM-7AM, please contact night-coverage [email or web address] 06/08/2019, 7:27 AM

## 2019-06-08 NOTE — Anesthesia Preprocedure Evaluation (Addendum)
Anesthesia Evaluation  Patient identified by MRN, date of birth, ID band Patient awake    Reviewed: Allergy & Precautions, NPO status , Patient's Chart, lab work & pertinent test results  Airway Mallampati: II  TM Distance: >3 FB Neck ROM: Full    Dental  (+) Dental Advisory Given   Pulmonary    breath sounds clear to auscultation       Cardiovascular hypertension,  Rhythm:Regular Rate:Normal     Neuro/Psych    GI/Hepatic   Endo/Other  diabetes  Renal/GU      Musculoskeletal   Abdominal Normal abdominal exam  (+)   Peds  Hematology   Anesthesia Other Findings   Reproductive/Obstetrics                            Anesthesia Physical Anesthesia Plan  ASA: III  Anesthesia Plan: General   Post-op Pain Management:    Induction: Intravenous  PONV Risk Score and Plan: Ondansetron and Dexamethasone  Airway Management Planned: Oral ETT  Additional Equipment:   Intra-op Plan:   Post-operative Plan: Extubation in OR  Informed Consent: I have reviewed the patients History and Physical, chart, labs and discussed the procedure including the risks, benefits and alternatives for the proposed anesthesia with the patient or authorized representative who has indicated his/her understanding and acceptance.     Dental advisory given  Plan Discussed with: CRNA and Anesthesiologist  Anesthesia Plan Comments:         Anesthesia Quick Evaluation

## 2019-06-08 NOTE — Anesthesia Procedure Notes (Signed)
Procedure Name: Intubation Date/Time: 06/08/2019 2:54 PM Performed by: Yunus Stoklosa T, CRNA Pre-anesthesia Checklist: Patient identified, Emergency Drugs available, Suction available and Patient being monitored Patient Re-evaluated:Patient Re-evaluated prior to induction Oxygen Delivery Method: Circle system utilized Preoxygenation: Pre-oxygenation with 100% oxygen Induction Type: IV induction Ventilation: Mask ventilation without difficulty Laryngoscope Size: Mac and 4 Grade View: Grade I Tube type: Oral Tube size: 7.5 mm Number of attempts: 1 Airway Equipment and Method: Stylet and Oral airway Placement Confirmation: ETT inserted through vocal cords under direct vision,  positive ETCO2 and breath sounds checked- equal and bilateral Secured at: 23 cm Tube secured with: Tape Dental Injury: Teeth and Oropharynx as per pre-operative assessment

## 2019-06-08 NOTE — Progress Notes (Signed)
Nutrition Follow up   DOCUMENTATION CODES:   Severe malnutrition in context of acute illness/injury  INTERVENTION:   Recommend continuation of TPN to meet 100% of needs until PO intake progresses/remains consistent (at 75%) or small bore feeding tube can be replaced as pt is severely malnourished.     Ensure Enlive po TID, each supplement provides 350 kcal and 20 grams of protein  30 ml Prostat TID, each supplement provides 100 kcals and 15 grams protein.   Magic cup TID with meals, each supplement provides 290 kcal and 9 grams of protein  MVI daily   NUTRITION DIAGNOSIS:   Severe Malnutrition related to acute illness(pancreatitis) as evidenced by energy intake < or equal to 50% for > or equal to 5 days, moderate fat depletion, mild fat depletion, moderate muscle depletion, mild muscle depletion.  Ongoing  GOAL:   Patient will meet greater than or equal to 90% of their needs  Not meeting  MONITOR:   TF tolerance, Weight trends, Labs, I & O's  REASON FOR ASSESSMENT:   Consult Assessment of nutrition requirement/status  ASSESSMENT:   Pt with a PMH significant for HTN, DM, gout, and recent admission for pancreatitis and DKA presented to ED with DKA, abdominal pain, SOB, and minimal PO intake since last admission.   1/28- s/p post pyloric small bore feeding tube placement (reads at the LOT) 2/11- s/p endoscopic cyst drainage, Cortrak removed during procedure  2/15- s/p endoscopic necrosectomy  2/17- s/p endoscopic necrosectomy  2/19- s/p endoscopic necrosectomy   Pt for another necrosectomy today.   Spoke with pt prior to procedure. States appetite is slowly increasing. Meal completions charted as 0-40% for his last eight meals (22% average). Does not like Ensure but will have a couple of sips. Does not wish to have snacks sent. Will continue with Magic Cups and add prostat.   Plan increase of TPN to goal rate of 80 ml/hr to provide 142 g protein and 2398 kcal (meets  100% of needs). CBGs elevated- insulin adjusted. Recommend continuation of TPN to meet 100% of needs until PO intake progresses/remains consistent or small bore feeding tube can be replaced as pt is severely malnourished.  Admission weight: 133.3 kg  Current weight: 140.7 kg   I/O: +5,610 ml since 2/8  UOP: 3,575 ml x 24 hrs   Medications: lomotil, 20 mg lasix BID, lantus, creon, MVI with minerals, NS @ 20 ml/hr  Labs: CBG 154-294  Diet Order:   Diet Order            Diet NPO time specified  Diet effective now              EDUCATION NEEDS:   Not appropriate for education at this time  Skin:  Skin Assessment: Reviewed RN Assessment  Last BM:  2/21  Height:   Ht Readings from Last 1 Encounters:  06/08/19 5\' 11"  (1.803 m)    Weight:   Wt Readings from Last 1 Encounters:  06/08/19 (!) 140.7 kg    Ideal Body Weight:  80.9 kg  BMI:  Body mass index is 43.26 kg/m.  Estimated Nutritional Needs:   Kcal:  2300-2500  Protein:  130-145 grams  Fluid:  >2L/d  06/10/19 RD, LDN Clinical Nutrition Pager # [phone]

## 2019-06-08 NOTE — Progress Notes (Addendum)
Pt refused his Morning meds today, he said he would take them later, except for his lasix - he would not take his lasix at all, due to fact it would make him thirsty.  He would take his IV meds, and SQ medications only (again except the IVP lasix).  Due to fact he was having a procedure today.  I did speak to Endo, and they RN advised that he could take them.  I told this to the Pt, and he still insisted he did not wan to take any PO meds, and risk the procedure being canceled because he took any PO meds with water.

## 2019-06-08 NOTE — Op Note (Signed)
[facility] Patient Name: Caleb Chambers Procedure Date : 06/08/2019 MRN: [id number] Attending MD: Justice Britain , MD Date of Birth: Sep 11, 1965 CSN: [id number] Age: 54 Admit Type: Inpatient Procedure:                Upper GI endoscopy Indications:              Pancreatic necrosis Providers:                Justice Britain, MD, Carlyn Reichert, RN, Lazaro Arms, Technician Referring MD:             Carol Ada, MD, [facility] Medicines:                General Anesthesia Complications:            No immediate complications. Estimated Blood Loss:     Estimated blood loss was minimal. Procedure:                Pre-Anesthesia Assessment:                           - Prior to the procedure, a History and Physical                            was performed, and patient medications and                            allergies were reviewed. The patient's tolerance of                            previous anesthesia was also reviewed. The risks                            and benefits of the procedure and the sedation                            options and risks were discussed with the patient.                            All questions were answered, and informed consent                            was obtained. Prior Anticoagulants: The patient has                            taken no previous anticoagulant or antiplatelet                            agents. ASA Grade Assessment: III - A patient with                            severe systemic disease. After reviewing the risks  and benefits, the patient was deemed in                            satisfactory condition to undergo the procedure.                           After obtaining informed consent, the endoscope was                            passed under direct vision. Throughout the                            procedure, the patient's blood pressure, pulse, and               oxygen saturations were monitored continuously. The                            [id number] ([id number] Olympus therapeutic                            gastroscope was introduced through the mouth, and                            advanced to the second part of duodenum. The upper                            GI endoscopy was accomplished without difficulty.                            The patient tolerated the procedure. The upper GI                            endoscopy was accomplished without difficulty. The                            patient tolerated the procedure. Scope In: Scope Out: Findings:      No gross lesions were noted in the proximal esophagus and in the mid       esophagus.      LA Grade A (one or more mucosal breaks less than 5 mm, not extending       between tops of 2 mucosal folds) esophagitis was found in the distal       esophagus.      A previously placed AXIOS cystgastrostomy stent was found on the       posterior wall of the stomach. The two double pigtail stents that were       within the AXIOS were removed from the cystgastrostomy with a snare. The       cyst was completely filled with fluid and black necrotic tissue that was       pasty and adherent to the cyst wall. Necrosectomy was performed with a       Endorotor Powered Endoscopic Debrider, requiring multiple intubations of       the cyst. The angulation of the necrosis on the top of the cyst makes       Endorotor morcellation difficult. I used a  raptor grasper to also work       on breaking up the necrosis. At the conclusion of the procedure, a       medium amount of necrotic tissue and a large amount of pink, viable       tissue was found within the pseudocyst on direct vision. Two double       pigtail stents (1 - 10 French x 5 cm & 1 - 7 French x 5 cm) were       inserted across the AXIOS cystgastrostomy tract to decrease necroma       blockage.      Patchy mildly erythematous mucosa without bleeding  was found in the       entire examined stomach.      No gross lesions were noted in the duodenal bulb, in the first portion       of the duodenum and in the second portion of the duodenum. Impression:               - No gross lesions in esophagus.                           - LA Grade A esophagitis.                           - Pre-existing AXIOS cystgastrostomy stent in                            place. Double pigtails removed. Necrosectomy was                            performed. Double pigtails replaced.                           - Erythematous mucosa in the stomach.                           - No gross lesions in the duodenal bulb, in the                            first portion of the duodenum and in the second                            portion of the duodenum. Recommendation:           - The patient will be observed post-procedure,                            until all discharge criteria are met.                           - Return patient to hospital ward for ongoing care.                           - Observe patient's clinical course.                           - ADAT.                           -  Antibiotics if able to be transitioned to PO will                            be helpful as we try to get patient out of hospital                            soon.                           - TPN titration as per Medical service/Dr. Benson Norway &                            Dr. Collene Mares.                           - Repeat upper endoscopy on 2/25 or 2/26 (will have                            to adjust my schedule to look for exact date) for                            retreatment.                           - The findings and recommendations were discussed                            with the patient.                           - The findings and recommendations were discussed                            with the patient's family.                           - The findings and recommendations were discussed                             with the referring physician. Procedure Code(s):        --- Professional ---                           [phone], Esophagogastroduodenoscopy, flexible,                            transoral; diagnostic, including collection of                            specimen(s) by brushing or washing, when performed                            (separate procedure)                           [id number], Unlisted procedure, pancreas Diagnosis Code(s):        ---  Professional ---                           K20.90, Esophagitis, unspecified without bleeding                           Z97.8, Presence of other specified devices                           K31.89, Other diseases of stomach and duodenum                           K86.89, Other specified diseases of pancreas CPT copyright 2019 [employer]. All rights reserved. The codes documented in this report are preliminary and upon coder review may  be revised to meet current compliance requirements. Justice Britain, MD 06/08/2019 5:31:34 PM Number of Addenda: 0

## 2019-06-08 NOTE — Progress Notes (Addendum)
PHARMACY - TOTAL PARENTERAL NUTRITION CONSULT NOTE  Indication:  Infected pancreatic pseudocyst  Patient Measurements: Height: 5\' 11"  (180.3 cm) Weight: (!) 310 lb 3 oz (140.7 kg) IBW/kg (Calculated) : 75.3 TPN AdjBW (KG): 91.7 Body mass index is 43.26 kg/m.  Assessment:  49 YOM presented on 05/12/19 with DKA.  Patient was recently discharged after treatment for DKA and acute pancreatitis.  CT showed worsening pancreatitis and the development of a fluid collection.  Patient was started on tube feeding on 1/28 and tolerated it well through 2/10.  A clear liquid diet was initiated on 05/22/19 but intake was minimal, and was off and on a clear diet until 2/15.  EGD on 2/11 showed esophagitis/duodenitis and patient underwent pancreatic and biliary stent placements on 2/12.  He is s/p necrosectomy for pancreatic pseudocyst/necrosis on 2/15 and again on 2/17.  Feeding tube placement delayed due to frequent procedures.  Patient was advanced to a cardiac diet on 2/15 and intake has been inadequate.  In setting of severe malutrition, Pharmacy consulted to manage TPN.  Glucose / Insulin: hx IDDM admitted with DKA - A1c 13.2%.  CBGs elevated with TPN, required 36 units rSSI + Lantus 10/d + 35 units in TPN *was on Lantus 45/20 BID and significant amount of SSI while on goal rate TF/oral diet  Electrolytes: K 3.6 (on Lasix 20 mg bid), Mag unchanged at 1.7, Phos 3.1 Renal: SCr 0.64, BUN WNL LFTs / TGs: LFTs / tbili / TG WNL Prealbumin / albumin: BL prealbumin low at 9.5>8.6, albumin low at 1.5 Intake / Output; MIVF: UOP 1.1 ml/kg/hr, requiring diuresis, NS at 20 ml/hr GI Imaging: none since TPN consult Surgeries / Procedures:  2/19 necrosectomy, no gross lesion in esophagus, erythematous mucosa  Central access: PICC placed 06/05/19 TPN start date: 06/05/19  Nutritional Goals (per RD rec on 2/18): 2300-2500 kCal, 130-145g protein, >2L fluid per day  Current Nutrition:  Heart diet:  20% of 2  meals Ensure TID: 0 charted given  Plan:   Increase concentrated TPN to goal rate of 80 ml/hr.  TPN provides 142g AA, 326g CHO and 72g ILE for a total of 2398 kCal, meeting ~100% of patient needs Electrolytes in TPN: increase K (105 mEq/d), continue Mag and Phos. Cl:Ac 1:1 for now Continue daily PO multivitamin (no multivitamin and trace elements in TPN) Change resistant SSI to Q4H + continue Lantus 10 units daily + continue increased regular insulin in TPN to 35 units KCL 10 meq IV x 4 today, f/u Lasix LOT   Calcium carbonate daily per MD F/U AM labs, CBGs, ability to place feeding tube, necrosectomy on Mon, po intake and discharge nutrition plans  Thu, PharmD, [facility] Clinical Pharmacist Please see AMION for all Pharmacists' Contact Phone Numbers 06/08/2019, 7:56 AM

## 2019-06-09 DIAGNOSIS — I10 Essential (primary) hypertension: Secondary | ICD-10-CM

## 2019-06-09 DIAGNOSIS — R600 Localized edema: Secondary | ICD-10-CM

## 2019-06-09 LAB — BASIC METABOLIC PANEL
Anion gap: 4 — ABNORMAL LOW (ref 5–15)
BUN: 8 mg/dL (ref 6–20)
CO2: 24 mmol/L (ref 22–32)
Calcium: 7.9 mg/dL — ABNORMAL LOW (ref 8.9–10.3)
Chloride: 109 mmol/L (ref 98–111)
Creatinine, Ser: 0.64 mg/dL (ref 0.61–1.24)
GFR calc Af Amer: >60 mL/min (ref >60)
GFR calc non Af Amer: >60 mL/min (ref >60)
Glucose, Bld: 160 mg/dL — ABNORMAL HIGH (ref 70–99)
Potassium: 3.9 mmol/L (ref 3.5–5.1)
Sodium: 137 mmol/L (ref 135–145)

## 2019-06-09 LAB — GLUCOSE, CAPILLARY
Glucose-Capillary: 171 mg/dL — ABNORMAL HIGH (ref 70–99)
Glucose-Capillary: 188 mg/dL — ABNORMAL HIGH (ref 70–99)
Glucose-Capillary: 196 mg/dL — ABNORMAL HIGH (ref 70–99)
Glucose-Capillary: 190 mg/dL — ABNORMAL HIGH (ref 70–99)
Glucose-Capillary: 230 mg/dL — ABNORMAL HIGH (ref 70–99)

## 2019-06-09 LAB — PHOSPHORUS: Phosphorus: 3.3 mg/dL (ref 2.5–4.6)

## 2019-06-09 LAB — MAGNESIUM: Magnesium: 1.6 mg/dL — ABNORMAL LOW (ref 1.7–2.4)

## 2019-06-09 MED ORDER — MAGNESIUM SULFATE 2 GM/50ML IV SOLN
2.0000 g | Freq: Once | INTRAVENOUS | Status: AC
  Administered 2019-06-09: 08:00:00 2 g via INTRAVENOUS
  Filled 2019-06-09: qty 50

## 2019-06-09 MED ORDER — METRONIDAZOLE 500 MG PO TABS
500.0000 mg | ORAL_TABLET | Freq: Three times a day (TID) | ORAL | Status: DC
  Administered 2019-06-09 – 2019-06-22 (×35): 500 mg via ORAL
  Filled 2019-06-09 (×37): qty 1

## 2019-06-09 MED ORDER — HYDROCHLOROTHIAZIDE 12.5 MG PO CAPS
12.5000 mg | ORAL_CAPSULE | Freq: Every day | ORAL | Status: DC
  Administered 2019-06-09 – 2019-06-16 (×8): 12.5 mg via ORAL
  Filled 2019-06-09 (×8): qty 1

## 2019-06-09 MED ORDER — BUMETANIDE 0.5 MG PO TABS
0.5000 mg | ORAL_TABLET | Freq: Every day | ORAL | Status: DC
  Administered 2019-06-09 – 2019-06-22 (×13): 0.5 mg via ORAL
  Filled 2019-06-09 (×14): qty 1

## 2019-06-09 MED ORDER — ONDANSETRON HCL 4 MG/2ML IJ SOLN
4.0000 mg | Freq: Three times a day (TID) | INTRAMUSCULAR | Status: AC | PRN
  Administered 2019-06-09 – 2019-06-18 (×10): 4 mg via INTRAVENOUS
  Filled 2019-06-09 (×10): qty 2

## 2019-06-09 MED ORDER — STERILE WATER FOR INJECTION IV SOLN
INTRAVENOUS | Status: AC
  Filled 2019-06-09: qty 947.2

## 2019-06-09 MED ORDER — DOXYCYCLINE HYCLATE 100 MG PO TABS
100.0000 mg | ORAL_TABLET | Freq: Two times a day (BID) | ORAL | Status: DC
  Administered 2019-06-10 – 2019-06-22 (×23): 100 mg via ORAL
  Filled 2019-06-09 (×25): qty 1

## 2019-06-09 MED ORDER — AMLODIPINE BESYLATE 10 MG PO TABS
10.0000 mg | ORAL_TABLET | Freq: Every day | ORAL | Status: DC
  Administered 2019-06-09 – 2019-06-22 (×14): 10 mg via ORAL
  Filled 2019-06-09 (×14): qty 1

## 2019-06-09 NOTE — Anesthesia Postprocedure Evaluation (Signed)
Anesthesia Post Note  Patient: ZAC TORTI  Procedure(s) Performed: ESOPHAGOGASTRODUODENOSCOPY (EGD) with NECROSECTOMY (N/A ) ENDOROTOR STENT REMOVAL PANCREATIC STENT PLACEMENT     Patient location during evaluation: Endoscopy Anesthesia Type: General Level of consciousness: awake and alert Pain management: pain level controlled Vital Signs Assessment: post-procedure vital signs reviewed and stable Respiratory status: spontaneous breathing, nonlabored ventilation and respiratory function stable Cardiovascular status: blood pressure returned to baseline and stable Postop Assessment: no apparent nausea or vomiting Anesthetic complications: no    Last Vitals:  Vitals:   06/09/19 1107 06/09/19 1614  BP: (!) 170/83 (!) 199/95  Pulse: 87   Resp: 16 19  Temp:  36.8 C  SpO2: 98%     Last Pain:  Vitals:   06/09/19 1614  TempSrc: Oral  PainSc:                  Lucretia Kern

## 2019-06-09 NOTE — Progress Notes (Signed)
[name]  PROGRESS NOTE    Caleb Chambers  KGU:[id number] DOB: 1965/11/03 DOA: 05/12/2019 PCP: Caleb Chambers, No Pcp Per   Brief Narrative:   54 year old African-American male, morbidly obese, with past medical history significant for hypertension,DM, gout; and recent admission for pancreatitis and DKA. Caleb Chambers presented to the hospital with complaints of minimal oral intake since his discharge. Caleb Chambers reported shortness of breath and decreased ability to eat and drink. He has not been fully compliant with insulin regimen as well. Caleb Chambers was then admitted to the hospital again for DKA and acute kidney injury, prerenal. DKA and acute kidney injury have resolved. Caleb Chambers is currently being managed for complications of severe pancreatitis.  06/09/19:BP is up today. Add home amlodipine. Add home HCTZ. Change abx to Po doxy and flagyl (will cover his staff and bacteroides). Will need to be on this til 06/24/19. He is going back to the OR for another necrosectomy on Friday.   Assessment & Plan:   Principal Problem:   DKA (diabetic ketoacidosis) (HCC) Active Problems:   Acute pancreatitis   Acute kidney injury (HCC)   Morbid obesity (HCC)   Debility   Protein-calorie malnutrition, severe   Pancreatic necrosis  Severe pancreatitis with peripancreatic fluid collection Necrotizing pancreatitis - questionable superimposed infection.  - Repeat CT abdomen/pelvis on 05/25/2019 showed persistent pancreatitis with new scattered peripancreatic gas, extensive ill-defined peripancreatic fluid extending throughout transverse colon into the root of mesentery. S/p cholecystectomy.  - GI was consulted; apprecaite assistance, 2 necrosectomies so far; possible repeat on Friday w/ Dr. Meridee Score.  - Drainage from pancreatic pseudocyst growing moderate staph aureus, started on vancomycin; continue for now ; continue flagyl for now - 06/04/19:Continue abx, he reports improvement in pain and  overall disposition - 06/05/19: back from OR after another necrosectomy; doing ok, continuing abx - 06/06/19: spoke with GI yesterday; will be on abx for at least 4 weeks, still awaiting sensitivities. Back to OR on Monday. Ab pain is way down and he is feeling well today. Needs to work with therapy and continue IS. - 06/08/19: back to OR today for necrosectomy     - 06/09/19: back to OR Friday for necrosectomy; until then will continue TPN, will transition him from IV abx to PO (flagyl/doxy -- will need this for week total coverage making the end date 06/24/19); appreciate GI     - got vanc dose today, start doxy in the AM  Recurrent DKA/diabetes mellitus type 2-resolved - Hemoglobin A1c was 13.2 on 04/22/2019.  -06/04/19:Lantus 10 units SQ daily; continue SSI; looks ok for now, will continue this dosage     - 06/09/19: glucose acceptable  History of hypertension - 06/05/19: BP is ok, monitor - 06/06/19: BP is elevated, let's start back some of his home meds. Will start with lisinopril at 10mg  qday     - 06/08/19: increase lisinopril to 20mg  qday     - 06/09/19: BP up this morning. Home regimen resumed at full doses (lisinopril, HCTZ, amlodipine); has PRN hydralazine  Acute kidney injury - resovled, monitor - 06/05/19: started on lasix IV for edema, renal function is ok, monitor - 06/05/19: renal function looks ok; starting back lisinopril, keep monitor     - 06/08/19: renal function looks good this AM  Microcytic anemia - no obvious bleeding - lab work suggestive of anemia of chronic disease in setting of inflammation. - received 1u pRBCs after previous necrocetomy, but did not have good response; he's 7.4 today. Follow Hgb. Transfuse for Hgb less  than 7.0. - 06/07/19: Hgb holdin g at 7.7. Monitor     - 06/08/19: Hgb 7.9; stable     - 06/09/19: CBC pending.       Hyponatremia Hypomagnesemia Hypokalemia - Na+ is stable,  monitor - 06/07/19: K+/Mg2+ will be replaced through TPN; it is being increased today     - 06/08/19: Mg2+/K+ is ok, monitor     - 06/09/19: Mg2+ down, replace  BLE edema - TED hose - elevate legs when in chair/bed - 06/07/19: continues to improve; can probably do this another day then switch to his home bumex.     - 06/08/19: can start back bumex today     - 06/09/19: lasix d/c (he did not want to take it yesterday); home bumex resumed; needs to continue the TED hose -- this should be a regular part of his regimen  Debility - PT/OT     - 06/09/19: he is intermittently working with PT/OT  DVT prophylaxis: lovenox Code Status: FULL Family Communication: Not asking for family updates   Disposition Plan: Necrosectomy on Friday. Remains inpt d/t TPN.  Consultants:   GI  Procedures:   Necrosectomy x 4  Antimicrobials:  . Flagyl, doxy (abx through 06/24/19)   ROS:  Reports feeling tired. Denies CP, N, V, ab pain . Remainder 10-pt ROS is negative for all not previously mentioned.  Subjective: "I didn't want to take the water pill yesterday."  Objective: Vitals:   06/08/19 2312 06/09/19 0351 06/09/19 0500 06/09/19 0628  BP: (!) 165/83 (!) 170/78  (!) 154/82  Pulse: (!) 103 93    Resp: 16 17  16   Temp: 98.5 F (36.9 C) 98.5 F (36.9 C)    TempSrc: Oral Oral    SpO2: 98% 98%    Weight:   (!) 141.2 kg   Height:        Intake/Output Summary (Last 24 hours) at 06/09/2019 0715 Last data filed at 06/09/2019 1610 Gross per 24 hour  Intake 1257.33 ml  Output 1225 ml  Net 32.33 ml   Filed Weights   06/08/19 0336 06/08/19 1232 06/09/19 0500  Weight: (!) 140.7 kg (!) 140.7 kg (!) 141.2 kg    Examination:  General: 54 y.o. male resting in bed in NAD Cardiovascular: RRR, +S1, S2, no m/g/r Respiratory: CTABL, no w/r/r, normal WOB GI: BS+, NDNT, soft, obese MSK: No c/c; BLE edema improved Neuro: Alert to name, follows commands Psyc: Appropriate interaction  and affect, calm/cooperative   Data Reviewed: I have personally reviewed following labs and imaging studies.  CBC: Recent Labs  Lab 06/04/19 0319 06/04/19 0319 06/04/19 1245 06/05/19 0306 06/06/19 0325 06/07/19 0412 06/08/19 0404  WBC 5.7   < > 8.5 6.3 4.7 4.8 5.2  NEUTROABS 3.6  --   --  4.3 2.9 3.8 3.0  HGB 6.8*   < > 8.8* 8.0* 7.5* 7.7* 7.9*  HCT 22.5*   < > 28.2* 25.9* 24.8* 25.6* 25.9*  MCV 83.0   < > 81.5 82.2 85.2 85.3 84.4  PLT 140*   < > 407* 387 341 348 321   < > = values in this interval not displayed.   Basic Metabolic Panel: Recent Labs  Lab 06/03/19 0608 06/03/19 9604 06/04/19 0319 06/05/19 0306 06/06/19 0325 06/07/19 0412 06/08/19 0404  NA 133*   < > 136 140 138 137 138  K 3.5   < > 3.9 3.2* 3.2* 3.3* 3.6  CL 104   < > 107 108 106 107  106  CO2 20*   < > 22 23 25 23 24   GLUCOSE 221*   < > 193* 165* 218* 257* 193*  BUN 8   < > 6 <5* 5* 5* 6  CREATININE 0.77   < > 0.86 0.70 0.74 0.55* 0.64  CALCIUM 7.6*   < > 7.5* 7.8* 7.5* 7.7* 7.8*  MG 1.4*   < > 1.8 1.7 1.6* 1.6* 1.7  PHOS 2.5  --   --  3.7 3.3 2.9 3.1   < > = values in this interval not displayed.   GFR: Estimated Creatinine Clearance: 155.4 mL/min (by C-G formula based on SCr of 0.64 mg/dL). Liver Function Tests: Recent Labs  Lab 06/04/19 0319 06/05/19 0306 06/07/19 0412 06/08/19 0404  AST 25 13* 10* 9*  ALT 14 13 10 9   ALKPHOS 68 59 55 52  BILITOT 1.1 0.7 0.3 0.2*  PROT 5.9* 6.3* 6.1* 6.5  ALBUMIN 1.5* 1.4* 1.4* 1.5*   No results for input(s): LIPASE, AMYLASE in the last 168 hours. No results for input(s): AMMONIA in the last 168 hours. Coagulation Profile: No results for input(s): INR, PROTIME in the last 168 hours. Cardiac Enzymes: No results for input(s): CKTOTAL, CKMB, CKMBINDEX, TROPONINI in the last 168 hours. BNP (last 3 results) No results for input(s): PROBNP in the last 8760 hours. HbA1C: No results for input(s): HGBA1C in the last 72 hours. CBG: Recent Labs  Lab  06/08/19 1058 06/08/19 1816 06/08/19 1933 06/08/19 2311 06/09/19 0352  GLUCAP 176* 168* 180* 193* 171*   Lipid Profile: Recent Labs    06/08/19 0404  TRIG 89   Thyroid Function Tests: No results for input(s): TSH, T4TOTAL, FREET4, T3FREE, THYROIDAB in the last 72 hours. Anemia Panel: No results for input(s): VITAMINB12, FOLATE, FERRITIN, TIBC, IRON, RETICCTPCT in the last 72 hours. Sepsis Labs: No results for input(s): PROCALCITON, LATICACIDVEN in the last 168 hours.  No results found for this or any previous visit (from the past 240 hour(s)).    Radiology Studies: No results found.   Scheduled Meds: . sodium chloride   Intravenous Once  . allopurinol  300 mg Oral Daily  . calcium carbonate  1 tablet Oral Daily  . chlorhexidine  15 mL Mouth Rinse BID  . Chlorhexidine Gluconate Cloth  6 each Topical Daily  . diphenoxylate-atropine  2 tablet Oral BID  . enoxaparin (LOVENOX) injection  40 mg Subcutaneous Q24H  . feeding supplement (ENSURE ENLIVE)  237 mL Oral TID BM  . feeding supplement (PRO-STAT SUGAR FREE 64)  30 mL Oral TID  . furosemide  20 mg Intravenous BID  . insulin aspart  0-20 Units Subcutaneous Q4H  . insulin glargine  10 Units Subcutaneous Daily  . lipase/protease/amylase  36,000 Units Oral TID AC  . lisinopril  20 mg Oral Daily  . morphine  30 mg Oral Q12H  . multivitamin with minerals  1 tablet Oral Daily  . pantoprazole  40 mg Oral Daily   Continuous Infusions: . metronidazole 500 mg (06/09/19 0601)  . TPN ADULT (ION) 80 mL/hr at 06/09/19 0639  . vancomycin 1,250 mg (06/09/19 0400)     LOS: 28 days    Time spent: 25 minutes spent in the coordination of care today.    06/11/19, DO [facility]  If 7PM-7AM, please contact night-coverage [email or web address] 06/09/2019, 7:15 AM

## 2019-06-09 NOTE — Progress Notes (Addendum)
OT Discharge Note  Patient Details Name: Caleb Chambers MRN: [id number] DOB: 1965/07/14   Cancelled Treatment:    Reason Eval/Treat Not Completed: Patient declined, no reason specified(OTR checked on him 2x today and pt declined tx.) Pt has shown very little initiative to work with OT in the past few weeks with more than 3 cancels. Pt reports "I just can't do it today." No specific reason given. Pt on the phone and unwilling to hang up to attempt OOB mobility. Pt continues to keep blinds drawn and lights out.  Even after education about trialing therapies on different days, pt continues to refuse after multiple attempts per day. OTR discharging from caseload today.  Flora Lipps, OTR/L Acute Rehabilitation Services Pager: [phone] Office: [phone]   Lakota Cellar 06/09/2019, 2:26 PM

## 2019-06-09 NOTE — Plan of Care (Signed)
  Problem: Education: Goal: Ability to describe self-care measures that may prevent or decrease complications (Diabetes Survival Skills Education) will improve Outcome: Progressing Goal: Individualized Educational Video(s) Outcome: Progressing   Problem: Cardiac: Goal: Ability to maintain an adequate cardiac output will improve Outcome: Progressing   Problem: Health Behavior/Discharge Planning: Goal: Ability to identify and utilize available resources and services will improve Outcome: Progressing Goal: Ability to manage health-related needs will improve Outcome: Progressing   Problem: Fluid Volume: Goal: Ability to achieve a balanced intake and output will improve Outcome: Progressing   Problem: Metabolic: Goal: Ability to maintain appropriate glucose levels will improve Outcome: Progressing   Problem: Nutritional: Goal: Maintenance of adequate nutrition will improve Outcome: Progressing Goal: Maintenance of adequate weight for body size and type will improve Outcome: Progressing   Problem: Respiratory: Goal: Will regain and/or maintain adequate ventilation Outcome: Progressing   Problem: Urinary Elimination: Goal: Ability to achieve and maintain adequate renal perfusion and functioning will improve Outcome: Progressing   Problem: Education: Goal: Knowledge of General Education information will improve Description: Including pain rating scale, medication(s)/side effects and non-pharmacologic comfort measures Outcome: Progressing   Problem: Health Behavior/Discharge Planning: Goal: Ability to manage health-related needs will improve Outcome: Progressing   Problem: Clinical Measurements: Goal: Ability to maintain clinical measurements within normal limits will improve Outcome: Progressing Goal: Will remain free from infection Outcome: Progressing Goal: Diagnostic test results will improve Outcome: Progressing Goal: Respiratory complications will improve Outcome:  Progressing Goal: Cardiovascular complication will be avoided Outcome: Progressing   Problem: Activity: Goal: Risk for activity intolerance will decrease Outcome: Progressing   Problem: Nutrition: Goal: Adequate nutrition will be maintained Outcome: Progressing   Problem: Coping: Goal: Level of anxiety will decrease Outcome: Progressing   Problem: Elimination: Goal: Will not experience complications related to bowel motility Outcome: Progressing Goal: Will not experience complications related to urinary retention Outcome: Progressing   Problem: Pain Managment: Goal: General experience of comfort will improve Outcome: Progressing   Problem: Safety: Goal: Ability to remain free from injury will improve Outcome: Progressing   Problem: Skin Integrity: Goal: Risk for impaired skin integrity will decrease Outcome: Progressing   

## 2019-06-09 NOTE — Progress Notes (Addendum)
PT Cancellation Note  Patient Details Name: Caleb Chambers MRN: [id number] DOB: July 02, 1965   Cancelled Treatment:    Reason Eval/Treat Not Completed: Patient declined, no reason specified Pt reports he will work with PT this afternoon. PT will continue to follow acutely.   Attempted to see pt second time today however pt on the phone upon arrival and refuses to work with therapy today.    Erline Levine, PTA Acute Rehabilitation Services Pager: [phone] Office: [phone]   06/09/2019, 9:59 AM

## 2019-06-09 NOTE — Progress Notes (Signed)
PHARMACY - TOTAL PARENTERAL NUTRITION CONSULT NOTE  Indication:  Infected pancreatic pseudocyst  Patient Measurements: Height: 5\' 11"  (180.3 cm) Weight: (!) 311 lb 4.6 oz (141.2 kg) IBW/kg (Calculated) : 75.3 TPN AdjBW (KG): 91.7 Body mass index is 43.42 kg/m.  Assessment:  Caleb Chambers presented on 05/12/19 with DKA.  Patient was recently discharged after treatment for DKA and acute pancreatitis.  CT showed worsening pancreatitis and the development of a fluid collection.  Patient was started on tube feeding on 1/28 and tolerated it well through 2/10.  A clear liquid diet was initiated on 05/22/19 but intake was minimal, and was off and on a clear diet until 2/15.  EGD on 2/11 showed esophagitis/duodenitis and patient underwent pancreatic and biliary stent placements on 2/12.  He is s/p necrosectomy for pancreatic pseudocyst/necrosis on 2/15 and again on 2/17.  Feeding tube placement delayed due to frequent procedures.  Patient was advanced to a cardiac diet on 2/15 and intake has been inadequate.  In setting of severe malutrition, Pharmacy consulted to manage TPN.  Glucose / Insulin: hx IDDM admitted with DKA - A1c 13.2%.  CBGs elevated with TPN, required 20 units rSSI + Lantus 10/d + Caleb units in TPN *was on Lantus 45/20 BID and significant amount of SSI while on goal rate TF/oral diet  Electrolytes: K 3.9 (Lasix held yesterday), Mag 1.6 (replace), Phos 3.3 Renal: SCr 0.64, BUN WNL LFTs / TGs: LFTs / tbili / TG WNL Prealbumin / albumin: BL prealbumin low at 9.5>8.6, albumin low at 1.5 Intake / Output; MIVF: UOP 0.4 ml/kg/hr, requiring diuresis GI Imaging: none since TPN consult Surgeries / Procedures:  2/19 necrosectomy, no gross lesion in esophagus, erythematous mucosa 2/22 necrostomy  Central access: PICC placed 06/05/19 TPN start date: 06/05/19  Nutritional Goals (per RD rec on 2/18): 2300-2500 kCal, 130-145g protein, >2L fluid per day  Current Nutrition:  Heart diet: 0 meals Ensure TID:  0 charted given TPN at least through Friday 2/26- due to need for additional procedure  Plan:   Continue concentrated TPN to goal rate of 80 ml/hr.  TPN provides 142g AA, 326g CHO and 72g ILE for a total of 2398 kCal, meeting ~100% of patient needs Electrolytes in TPN: increase K (105 mEq/d), incr Mag and continue Phos. Cl:Ac 1:1 for now Continue daily PO multivitamin (no multivitamin and trace elements in TPN) Change resistant SSI to Q4H + continue Lantus 10 units daily + continue increased regular insulin in TPN to Caleb units Mag 2 gms IV x 1  Calcium carbonate daily per MD F/U AM labs, CBGs, if lasix is restarted, ability to place feeding tube, po intake and discharge nutrition plans  [id number], PharmD, [facility] Clinical Pharmacist Please see AMION for all Pharmacists' Contact Phone Numbers 06/09/2019, 7:37 AM

## 2019-06-09 NOTE — Plan of Care (Signed)
  Problem: Education: Goal: Knowledge of General Education information will improve Description: Including pain rating scale, medication(s)/side effects and non-pharmacologic comfort measures Outcome: Progressing   Problem: Coping: Goal: Level of anxiety will decrease Outcome: Progressing   Problem: Elimination: Goal: Will not experience complications related to bowel motility Outcome: Progressing Goal: Will not experience complications related to urinary retention Outcome: Progressing   Problem: Safety: Goal: Ability to remain free from injury will improve Outcome: Progressing   Problem: Skin Integrity: Goal: Risk for impaired skin integrity will decrease Outcome: Progressing   

## 2019-06-09 NOTE — TOC Initial Note (Addendum)
Transition of Care [facility]) - Initial/Assessment Note    Patient Details  Name: Caleb Chambers MRN: [id number] Date of Birth: 1966/03/24  Transition of Care [facility]) CM/SW Contact:    Zenon Mayo, RN Phone Number: 06/09/2019, 12:43 PM  Clinical Narrative:                 Patient is from home with Mom, he may be going home with TPN, will need HHRN,  NCM left medicare.gov list with patient to look over, he wanted to talk to his family about the agency choice.  NCM will check back with him,  He states he will have transport home at dc.  He also stated he would like BSC and rolling walker. NCM made referral to [facility] with [employer], this will be delivered to patient room prior to dc. Patient states he will let NCM know tomorrow who he decides on for [facility],  Expected Discharge Plan: [place] Barriers to Discharge: Continued Medical Work up   Patient Goals and CMS Choice Patient states their goals for this hospitalization and ongoing recovery are:: home with [facility] CMS Medicare.gov Compare Post Acute Care list provided to:: Patient Choice offered to / list presented to : Patient  Expected Discharge Plan and Services Expected Discharge Plan: [place]   Discharge Planning Services: CM Consult Post Acute Care Choice: [place] arrangements for the past 2 months: Single Family Home                 DME Arranged: Walker rolling, Bedside commode DME Agency: [employer] Date DME Agency Contacted: 06/09/19 Time DME Agency Contacted: [id number] Representative spoke with at DME Agency: [email or web address]            Prior Living Arrangements/Services Living arrangements for the past 2 months: [place] with:: Parents Patient language and need for interpreter reviewed:: Yes Do you feel safe going back to the place where you live?: Yes      Need for Family Participation in Patient Care: Yes (Comment) Care giver support system in place?: Yes (comment)    Criminal Activity/Legal Involvement Pertinent to Current Situation/Hospitalization: No - Comment as needed  Activities of Daily Living Home Assistive Devices/Equipment: CBG Meter, Wheelchair ADL Screening (condition at time of admission) Patient's cognitive ability adequate to safely complete daily activities?: Yes Is the patient deaf or have difficulty hearing?: No Does the patient have difficulty seeing, even when wearing glasses/contacts?: No Does the patient have difficulty concentrating, remembering, or making decisions?: No Patient able to express need for assistance with ADLs?: Yes Does the patient have difficulty dressing or bathing?: No Independently performs ADLs?: Yes (appropriate for developmental age) Does the patient have difficulty walking or climbing stairs?: Yes Weakness of Legs: Both Weakness of Arms/Hands: None  Permission Sought/Granted                  Emotional Assessment Appearance:: Appears stated age Attitude/Demeanor/Rapport: Engaged Affect (typically observed): Appropriate Orientation: : Oriented to Self, Oriented to Place, Oriented to  Time, Oriented to Situation Alcohol / Substance Use: Not Applicable Psych Involvement: No (comment)  Admission diagnosis:  Dehydration [E86.0] DKA (diabetic ketoacidosis) ([place]) [E11.10] AKI (acute kidney injury) ([place]) [N17.9] Diabetic ketoacidosis without coma associated with other specified diabetes mellitus ([place]) [E13.10] Patient Active Problem List   Diagnosis Date Noted  . Pancreatic necrosis   . Protein-calorie malnutrition, severe 05/13/2019  . DKA (diabetic ketoacidosis) ([place]) 05/12/2019  . Debility 05/12/2019  .  Morbid obesity (HCC)   . Abdominal pain, epigastric   . Abnormal CT of the abdomen   . Acute pancreatitis 04/20/2019  . DKA (diabetic ketoacidoses) (HCC) 04/20/2019  . Acute kidney injury [facility])    PCP:  Patient, No Pcp Per Pharmacy:   [id number] [place], [place] - [phone] AT [facility] OF [facility] & [address] [place] [address] Phone: [phone] Fax: [phone]     Social Determinants of Health (SDOH) Interventions    Readmission Risk Interventions Readmission Risk Prevention Plan 06/09/2019  Transportation Screening Complete  Medication Review [employer]) Complete  SW Recovery Care/Counseling Consult Complete  Palliative Care Screening Not Applicable  Skilled Nursing Facility Patient Refused  Some recent data might be hidden

## 2019-06-09 NOTE — Progress Notes (Signed)
Physical Therapy Discharge Patient Details Name: Caleb Chambers MRN: [id number] DOB: 1965-10-29 Today's Date: 06/09/2019 Time:  -     Patient discharged from PT services secondary to patient has refused 3 (three) consecutive times without medical reason.  Please see latest therapy progress note for current level of functioning and progress toward goals.    Progress and discharge plan discussed with patient and/or caregiver: Patient/Caregiver agrees with plan  Pt educated on PT cancellation policy last week.      Erline Levine, PTA Acute Rehabilitation Services Pager: [phone] Office: [phone]   06/09/2019, 2:25 PM

## 2019-06-10 LAB — MAGNESIUM: Magnesium: 1.6 mg/dL — ABNORMAL LOW (ref 1.7–2.4)

## 2019-06-10 LAB — BASIC METABOLIC PANEL
Anion gap: 8 (ref 5–15)
BUN: 8 mg/dL (ref 6–20)
CO2: 25 mmol/L (ref 22–32)
Calcium: 8.2 mg/dL — ABNORMAL LOW (ref 8.9–10.3)
Chloride: 103 mmol/L (ref 98–111)
Creatinine, Ser: 0.56 mg/dL — ABNORMAL LOW (ref 0.61–1.24)
GFR calc Af Amer: >60 mL/min (ref >60)
GFR calc non Af Amer: >60 mL/min (ref >60)
Glucose, Bld: 182 mg/dL — ABNORMAL HIGH (ref 70–99)
Potassium: 3.8 mmol/L (ref 3.5–5.1)
Sodium: 136 mmol/L (ref 135–145)

## 2019-06-10 LAB — GLUCOSE, CAPILLARY
Glucose-Capillary: 147 mg/dL — ABNORMAL HIGH (ref 70–99)
Glucose-Capillary: 183 mg/dL — ABNORMAL HIGH (ref 70–99)
Glucose-Capillary: 188 mg/dL — ABNORMAL HIGH (ref 70–99)
Glucose-Capillary: 193 mg/dL — ABNORMAL HIGH (ref 70–99)
Glucose-Capillary: 221 mg/dL — ABNORMAL HIGH (ref 70–99)
Glucose-Capillary: 238 mg/dL — ABNORMAL HIGH (ref 70–99)

## 2019-06-10 LAB — PHOSPHORUS: Phosphorus: 3.7 mg/dL (ref 2.5–4.6)

## 2019-06-10 MED ORDER — INSULIN ASPART 100 UNIT/ML ~~LOC~~ SOLN
0.0000 [IU] | Freq: Every day | SUBCUTANEOUS | Status: DC
  Administered 2019-06-12: 3 [IU] via SUBCUTANEOUS
  Administered 2019-06-13: 2 [IU] via SUBCUTANEOUS
  Administered 2019-06-15 – 2019-06-17 (×2): 4 [IU] via SUBCUTANEOUS

## 2019-06-10 MED ORDER — MAGNESIUM SULFATE 4 GM/100ML IV SOLN
4.0000 g | Freq: Once | INTRAVENOUS | Status: AC
  Administered 2019-06-10: 4 g via INTRAVENOUS
  Filled 2019-06-10: qty 100

## 2019-06-10 MED ORDER — SIMETHICONE 80 MG PO CHEW
80.0000 mg | CHEWABLE_TABLET | Freq: Four times a day (QID) | ORAL | Status: DC | PRN
  Administered 2019-06-10 – 2019-06-21 (×6): 80 mg via ORAL
  Filled 2019-06-10 (×6): qty 1

## 2019-06-10 MED ORDER — INSULIN ASPART 100 UNIT/ML ~~LOC~~ SOLN
0.0000 [IU] | Freq: Three times a day (TID) | SUBCUTANEOUS | Status: DC
  Administered 2019-06-10: 17:00:00 7 [IU] via SUBCUTANEOUS
  Administered 2019-06-11: 18:00:00 4 [IU] via SUBCUTANEOUS
  Administered 2019-06-11: 08:00:00 3 [IU] via SUBCUTANEOUS
  Administered 2019-06-11: 4 [IU] via SUBCUTANEOUS
  Administered 2019-06-12 – 2019-06-13 (×5): 11 [IU] via SUBCUTANEOUS
  Administered 2019-06-14 (×3): 7 [IU] via SUBCUTANEOUS
  Administered 2019-06-15: 4 [IU] via SUBCUTANEOUS
  Administered 2019-06-16 (×2): 20 [IU] via SUBCUTANEOUS

## 2019-06-10 MED ORDER — STERILE WATER FOR INJECTION IV SOLN
INTRAVENOUS | Status: DC
  Filled 2019-06-10: qty 947.2

## 2019-06-10 NOTE — Progress Notes (Signed)
PHARMACY - TOTAL PARENTERAL NUTRITION CONSULT NOTE  Addendum:  D/C TPN post this bag per MD:  reduce TPN to 40 ml/hr, then stop at 1800 (RN aware) Continue daily PO multivitamin D/C TPN labs and nursing care orders Change SSI to ACHS, insulin adjustment per MD  Chelsea Aus D. Laney Potash, PharmD, BCPS, BCCCP 06/10/2019, 2:55 PM

## 2019-06-10 NOTE — Progress Notes (Addendum)
PROGRESS NOTE    Caleb Chambers  ZMO:[id number] DOB: 1965/12/29 DOA: 05/12/2019 PCP: Patient, No Pcp Per    Brief Narrative:  Patient was admitted to the hospital with a working diagnosis of diabetes ketoacidosis complicated by acute kidney injury.  Hospitalization has been complicated by severe necrotizing pancreatitis with pancreatic pseudocyst staph aureus infection  53 year old male who presented with diabetes ketoacidosis.  He does have significant past medical history for hypertension, morbid obesity, type 2 diabetes mellitus and gout.  He had a recent hospitalization for pancreatitis and diabetes ketoacidosis 01/4 to 01/13.  At home patient reported ongoing abdominal pain, and poor oral intake.  Poor compliance with insulin.  On his initial physical examination blood pressure was 96/71, heart rate 102, respiratory rate 23, temperature 98.6, oxygenation 99%.  His lungs were clear to auscultation bilaterally, heart S1-S2 present rhythmic, soft abdomen, right upper quadrant tenderness, no rebound, no lower extremity edema. Sodium 123, potassium 3.6, chloride 87, bicarb 14, glucose 609, BUN 57, creatinine 2.96.  SARS COVID-19 negative.  EKG 98 bpm, left axis deviation, normal intervals, sinus rhythm, no ST segment or T wave changes.  CT of the abdomen with marked severity acute pancreatitis, associated with a very large pancreatic fluid collection.  Patient received supportive medical therapy, repeat CT abdomen showed persistent pancreatitis with new scattered peripancreatic gas, extensive ill-defined peripancreatic fluid extending to the transverse colon to the root of mesentery.    Gastroenterology was consulted and patient underwent upper endoscopic ultrasonography (05/28/19), a AXIOS cystogastrostomy tube was created and 1.2 L of fluid were removed (double-pigtail replaced through AXIOS). Removed pigtails on 06/01/19, through upper endoscopy. Follow up EGD on 02/17, 02/19. On 02/22 double  pigtails were replaced.   Cyst culture positive for staph aureus, patient has been receiving TPN and antibiotic therapy with vancomycin and metronidazole.    Assessment & Plan:   Principal Problem:   DKA (diabetic ketoacidosis) (HCC) Active Problems:   Acute pancreatitis   Acute kidney injury (HCC)   Morbid obesity (HCC)   Debility   Protein-calorie malnutrition, severe   Pancreatic necrosis  1. Necrotizing pancreatitis, infected pancreatic pseudocyst (Staph aureus), complicated by DKA present on admission. Patient is sp 5 endoscopic procedures, the more recent on 02.22, documented double pig tails replaced. Patient is tolerating now regular diet. Last wbc from 02.22 was 5,2, patient has been afebrile. DKA has resolved and now on sq insulin.   Will continue oral antibiotic therapy with doxycycline and metronidazole with good toleration. If no further pain or vomiting, will dc TPN today after last bag. Continue analgesics with hydromorphone, antiacids with pantoprazole and will add simethicone for gas.   Possible repeat endoscopic procedure this week.   2. Uncontrolled T2DM (Hgb A1c 13.2) complicated with DKA. Fasting glucose this am is 182, will continue basal insulin with 10 units of glargine and insulin sliding scale for glucose cover and monitoring.   3. HTN. Blood pressure 175/84, will continue blood pressure control with lisinopril, HZTC, amlodipine and bumetanide.   4. Hypokalemia, hyponatremia and hypomagnesemia. K at 3,9 and Mg at 1,6, Na at 136. Will continue electrolyte correction per TPN protocol, renal function is preserved with serum cr at 0,56. Will follow with renal panel in am.   5. Calorie protein malnutrition, severe, Continue with nutritional supplements.   DVT prophylaxis: enoxaparin   Code Status: full Family Communication: no family at the bedside  Disposition Plan/ discharge barriers: transfer to medical ward, discontinue telemetry.    Nutrition  Status:  Nutrition Problem: Severe Malnutrition Etiology: acute illness(pancreatitis) Signs/Symptoms: energy intake < or equal to 50% for > or equal to 5 days, moderate fat depletion, mild fat depletion, moderate muscle depletion, mild muscle depletion Interventions: Tube feeding     Consultants:   GI   Procedures:   02/11, 02/15, 02/17, 02/22 EGD/ EUS  Antimicrobials:   Doxycycline and metronidazole.     Subjective: Patient is tolerating well clears, with no nausea or vomiting, no chest pain or diarrhea. Positive gas and mild distention. No dyspnea.   Objective: Vitals:   06/10/19 0002 06/10/19 0351 06/10/19 0618 06/10/19 0748  BP: (!) 151/72 (!) 177/92 139/76 (!) 175/84  Pulse: 97 96  97  Resp: 19 18  20   Temp: 98.2 F (36.8 C) 98.3 F (36.8 C)  97.7 F (36.5 C)  TempSrc: Oral Oral  Oral  SpO2: 98% 98%  96%  Weight:  (!) 138.7 kg    Height:        Intake/Output Summary (Last 24 hours) at 06/10/2019 0820 Last data filed at 06/10/2019 0600 Gross per 24 hour  Intake 2140.12 ml  Output 3325 ml  Net -1184.88 ml   Filed Weights   06/08/19 1232 06/09/19 0500 06/10/19 0351  Weight: (!) 140.7 kg (!) 141.2 kg (!) 138.7 kg    Examination:   General: Not in pain or dyspnea. Deconditioned  Neurology: Awake and alert, non focal  E ENT: no pallor, no icterus, oral mucosa moist Cardiovascular: No JVD. S1-S2 present, rhythmic, no gallops, rubs, or murmurs. No lower extremity edema. Pulmonary: positive breath sounds bilaterally, adequate air movement, no wheezing, rhonchi or rales. Gastrointestinal. Abdomen mild distended but no organomegaly, non tender, no rebound or guarding Skin. No rashes Musculoskeletal: no joint deformities     Data Reviewed: I have personally reviewed following labs and imaging studies  CBC: Recent Labs  Lab 06/04/19 0319 06/04/19 0319 06/04/19 1245 06/05/19 0306 06/06/19 0325 06/07/19 0412 06/08/19 0404  WBC 5.7   < > 8.5 6.3 4.7 4.8  5.2  NEUTROABS 3.6  --   --  4.3 2.9 3.8 3.0  HGB 6.8*   < > 8.8* 8.0* 7.5* 7.7* 7.9*  HCT 22.5*   < > 28.2* 25.9* 24.8* 25.6* 25.9*  MCV 83.0   < > 81.5 82.2 85.2 85.3 84.4  PLT 140*   < > 407* 387 341 348 321   < > = values in this interval not displayed.   Basic Metabolic Panel: Recent Labs  Lab 06/06/19 0325 06/07/19 0412 06/08/19 0404 06/09/19 0630 06/10/19 0413  NA 138 137 138 137 136  K 3.2* 3.3* 3.6 3.9 3.8  CL 106 107 106 109 103  CO2 25 23 24 24 25   GLUCOSE 218* 257* 193* 160* 182*  BUN 5* 5* 6 8 8   CREATININE 0.74 0.55* 0.64 0.64 0.56*  CALCIUM 7.5* 7.7* 7.8* 7.9* 8.2*  MG 1.6* 1.6* 1.7 1.6* 1.6*  PHOS 3.3 2.9 3.1 3.3 3.7   GFR: Estimated Creatinine Clearance: 153.8 mL/min (A) (by C-G formula based on SCr of 0.56 mg/dL (L)). Liver Function Tests: Recent Labs  Lab 06/04/19 0319 06/05/19 0306 06/07/19 0412 06/08/19 0404  AST 25 13* 10* 9*  ALT 14 13 10 9   ALKPHOS 68 59 55 52  BILITOT 1.1 0.7 0.3 0.2*  PROT 5.9* 6.3* 6.1* 6.5  ALBUMIN 1.5* 1.4* 1.4* 1.5*   No results for input(s): LIPASE, AMYLASE in the last 168 hours. No results for input(s): AMMONIA in the last  168 hours. Coagulation Profile: No results for input(s): INR, PROTIME in the last 168 hours. Cardiac Enzymes: No results for input(s): CKTOTAL, CKMB, CKMBINDEX, TROPONINI in the last 168 hours. BNP (last 3 results) No results for input(s): PROBNP in the last 8760 hours. HbA1C: No results for input(s): HGBA1C in the last 72 hours. CBG: Recent Labs  Lab 06/09/19 1646 06/09/19 1953 06/10/19 0001 06/10/19 0354 06/10/19 0751  GLUCAP 190* 230* 183* 188* 193*   Lipid Profile: Recent Labs    06/08/19 0404  TRIG 89   Thyroid Function Tests: No results for input(s): TSH, T4TOTAL, FREET4, T3FREE, THYROIDAB in the last 72 hours. Anemia Panel: No results for input(s): VITAMINB12, FOLATE, FERRITIN, TIBC, IRON, RETICCTPCT in the last 72 hours.    Radiology Studies: I have reviewed all of  the imaging during this hospital visit personally     Scheduled Meds: . sodium chloride   Intravenous Once  . allopurinol  300 mg Oral Daily  . amLODipine  10 mg Oral Daily  . bumetanide  0.5 mg Oral Daily  . calcium carbonate  1 tablet Oral Daily  . chlorhexidine  15 mL Mouth Rinse BID  . Chlorhexidine Gluconate Cloth  6 each Topical Daily  . diphenoxylate-atropine  2 tablet Oral BID  . doxycycline  100 mg Oral Q12H  . enoxaparin (LOVENOX) injection  40 mg Subcutaneous Q24H  . feeding supplement (ENSURE ENLIVE)  237 mL Oral TID BM  . feeding supplement (PRO-STAT SUGAR FREE 64)  30 mL Oral TID  . hydrochlorothiazide  12.5 mg Oral Daily  . insulin aspart  0-20 Units Subcutaneous Q4H  . insulin glargine  10 Units Subcutaneous Daily  . lipase/protease/amylase  36,000 Units Oral TID AC  . lisinopril  20 mg Oral Daily  . metroNIDAZOLE  500 mg Oral Q8H  . morphine  30 mg Oral Q12H  . multivitamin with minerals  1 tablet Oral Daily  . pantoprazole  40 mg Oral Daily   Continuous Infusions: . magnesium sulfate bolus IVPB    . TPN ADULT (ION) 80 mL/hr at 06/10/19 0600  . TPN ADULT (ION)       LOS: 29 days        Renae Mottley Annett Gula, MD

## 2019-06-10 NOTE — Progress Notes (Signed)
Subjective: No new complaints.  Objective: Vital signs in last 24 hours: Temp:  [97.4 F (36.3 C)-98.4 F (36.9 C)] 98.3 F (36.8 C) (02/24 0351) Pulse Rate:  [87-102] 96 (02/24 0351) Resp:  [16-20] 18 (02/24 0351) BP: (139-202)/(72-95) 139/76 (02/24 0618) SpO2:  [98 %] 98 % (02/24 0351) Weight:  [138.7 kg] 138.7 kg (02/24 0351) Last BM Date: 06/07/19  Intake/Output from previous day: 02/23 0701 - 02/24 0700 In: 2140.1 [I.V.:2080; IV Piggyback:60.2] Out: 3325 [Urine:3325] Intake/Output this shift: No intake/output data recorded.  General appearance: alert and no distress GI: soft, non-tender; bowel sounds normal; no masses,  no organomegaly  Lab Results: Recent Labs    06/08/19 0404  WBC 5.2  HGB 7.9*  HCT 25.9*  PLT 321   BMET Recent Labs    06/08/19 0404 06/09/19 0630 06/10/19 0413  NA 138 137 136  K 3.6 3.9 3.8  CL 106 109 103  CO2 24 24 25   GLUCOSE 193* 160* 182*  BUN 6 8 8   CREATININE 0.64 0.64 0.56*  CALCIUM 7.8* 7.9* 8.2*   LFT Recent Labs    06/08/19 0404  PROT 6.5  ALBUMIN 1.5*  AST 9*  ALT 9  ALKPHOS 52  BILITOT 0.2*   PT/INR No results for input(s): LABPROT, INR in the last 72 hours. Hepatitis Panel No results for input(s): HEPBSAG, HCVAB, HEPAIGM, HEPBIGM in the last 72 hours. C-Diff No results for input(s): CDIFFTOX in the last 72 hours. Fecal Lactopherrin No results for input(s): FECLLACTOFRN in the last 72 hours.  Studies/Results: No results found.  Medications:  Scheduled: . sodium chloride   Intravenous Once  . allopurinol  300 mg Oral Daily  . amLODipine  10 mg Oral Daily  . bumetanide  0.5 mg Oral Daily  . calcium carbonate  1 tablet Oral Daily  . chlorhexidine  15 mL Mouth Rinse BID  . Chlorhexidine Gluconate Cloth  6 each Topical Daily  . diphenoxylate-atropine  2 tablet Oral BID  . doxycycline  100 mg Oral Q12H  . enoxaparin (LOVENOX) injection  40 mg Subcutaneous Q24H  . feeding supplement (ENSURE ENLIVE)  237  mL Oral TID BM  . feeding supplement (PRO-STAT SUGAR FREE 64)  30 mL Oral TID  . hydrochlorothiazide  12.5 mg Oral Daily  . insulin aspart  0-20 Units Subcutaneous Q4H  . insulin glargine  10 Units Subcutaneous Daily  . lipase/protease/amylase  36,000 Units Oral TID AC  . lisinopril  20 mg Oral Daily  . metroNIDAZOLE  500 mg Oral Q8H  . morphine  30 mg Oral Q12H  . multivitamin with minerals  1 tablet Oral Daily  . pantoprazole  40 mg Oral Daily   Continuous: . TPN ADULT (ION) 80 mL/hr at 06/10/19 0600    Assessment/Plan: 1) Infected pancreatic pseudocyst/WON. 2) Severe malnutrition. 3) DM. 4) Deconditioning.   The patient is worried about losing his job, paying bills, etc.  This is the reason he does not participate in PT/OT.  He was instructed to participate with PT/OT as this helps with his recovery and eventual discharge.  Clinically he remains stable and continues to improve with the necrosectomy.  Plan: 1) Continue with current management.  LOS: 29 days   Caleb Chambers D 06/10/2019, 7:34 AM

## 2019-06-10 NOTE — Progress Notes (Signed)
PHARMACY - TOTAL PARENTERAL NUTRITION CONSULT NOTE  Indication:  Infected pancreatic pseudocyst  Patient Measurements: Height: 5\' 11"  (180.3 cm) Weight: (!) 305 lb 12.5 oz (138.7 kg) IBW/kg (Calculated) : 75.3 TPN AdjBW (KG): 91.7 Body mass index is 42.65 kg/m.  Assessment:  Caleb Chambers presented on 05/12/19 with DKA.  Patient was recently discharged after treatment for DKA and acute pancreatitis.  CT showed worsening pancreatitis and the development of a fluid collection.  Patient was started on tube feeding on 1/28 and tolerated it well through 2/10.  A clear liquid diet was initiated on 05/22/19 but intake was minimal, and was off and on a clear diet until 2/15.  EGD on 2/11 showed esophagitis/duodenitis and patient underwent pancreatic and biliary stent placements on 2/12.  He is s/p necrosectomy for pancreatic pseudocyst/necrosis on 2/15 and again on 2/17.  Feeding tube placement delayed due to frequent procedures.  Patient was advanced to a cardiac diet on 2/15 and intake has been inadequate.  In setting of severe malutrition, Pharmacy consulted to manage TPN.  Glucose / Insulin: hx IDDM admitted with DKA - A1c 13.2%.  CBGs elevated with TPN, required 27 units rSSI + Lantus 10/d + 35 units in TPN *was on Lantus 45/20 BID and significant amount of SSI while on goal rate TF/oral diet  Electrolytes: K 3.8 (Lasix held), Mag 1.6 (replace), Phos 3.7 Renal: SCr 0.56, BUN WNL LFTs / TGs: LFTs / tbili / TG WNL Prealbumin / albumin: BL prealbumin low at 9.5>8.6, albumin low at 1.5 Intake / Output; MIVF: UOP 1 ml/kg/hr GI Imaging: none since TPN consult Surgeries / Procedures:  2/19 necrosectomy, no gross lesion in esophagus, erythematous mucosa 2/22 necrostomy  Central access: PICC placed 06/05/19 TPN start date: 06/05/19  Nutritional Goals (per RD rec on 2/22): 2300-2500 kCal, 130-145g protein, >2L fluid per day  Current Nutrition:  Heart diet: 0 meals Ensure TID: 1 charted given TPN at least  through Friday 2/26- due to need for additional procedure  Plan:   Continue concentrated TPN to goal rate of 80 ml/hr.  TPN provides 142g AA, 326g CHO and 72g ILE for a total of 2398 kCal, meeting ~100% of patient needs Electrolytes in TPN: increase Na, continue K (105 mEq/d), incr Mag and decr Phos. Cl:Ac 1:1 for now Continue daily PO multivitamin (no multivitamin and trace elements in TPN) Continue resistant SSI to Q4H + continue Lantus 10 units daily + continue increased regular insulin in TPN to 35 units Mag 4 gms IV x 1  Calcium carbonate daily per MD F/U AM labs, CBGs, if lasix is restarted, ability to place feeding tube, po intake and discharge nutrition plans  [id number], PharmD, [facility] Clinical Pharmacist Please see AMION for all Pharmacists' Contact Phone Numbers 06/10/2019, 7:43 AM

## 2019-06-11 DIAGNOSIS — K863 Pseudocyst of pancreas: Secondary | ICD-10-CM

## 2019-06-11 LAB — GLUCOSE, CAPILLARY
Glucose-Capillary: 149 mg/dL — ABNORMAL HIGH (ref 70–99)
Glucose-Capillary: 157 mg/dL — ABNORMAL HIGH (ref 70–99)
Glucose-Capillary: 156 mg/dL — ABNORMAL HIGH (ref 70–99)
Glucose-Capillary: 141 mg/dL — ABNORMAL HIGH (ref 70–99)

## 2019-06-11 LAB — BASIC METABOLIC PANEL
Anion gap: 7 (ref 5–15)
BUN: 6 mg/dL (ref 6–20)
CO2: 25 mmol/L (ref 22–32)
Calcium: 8.1 mg/dL — ABNORMAL LOW (ref 8.9–10.3)
Chloride: 103 mmol/L (ref 98–111)
Creatinine, Ser: 0.76 mg/dL (ref 0.61–1.24)
GFR calc Af Amer: >60 mL/min (ref >60)
GFR calc non Af Amer: >60 mL/min (ref >60)
Glucose, Bld: 201 mg/dL — ABNORMAL HIGH (ref 70–99)
Potassium: 3.9 mmol/L (ref 3.5–5.1)
Sodium: 135 mmol/L (ref 135–145)

## 2019-06-11 LAB — CBC WITH DIFFERENTIAL/PLATELET
Abs Immature Granulocytes: 0 10*3/uL (ref 0.00–0.07)
Basophils Absolute: 0.1 10*3/uL (ref 0.0–0.1)
Basophils Relative: 1 %
Eosinophils Absolute: 0 10*3/uL (ref 0.0–0.5)
Eosinophils Relative: 0 %
HCT: 27.1 % — ABNORMAL LOW (ref 39.0–52.0)
Hemoglobin: 8 g/dL — ABNORMAL LOW (ref 13.0–17.0)
Lymphocytes Relative: 15 %
Lymphs Abs: 0.8 10*3/uL (ref 0.7–4.0)
MCH: 25.3 pg — ABNORMAL LOW (ref 26.0–34.0)
MCHC: 29.5 g/dL — ABNORMAL LOW (ref 30.0–36.0)
MCV: 85.8 fL (ref 80.0–100.0)
Monocytes Absolute: 0.8 10*3/uL (ref 0.1–1.0)
Monocytes Relative: 15 %
Neutro Abs: 3.6 10*3/uL (ref 1.7–7.7)
Neutrophils Relative %: 69 %
Platelets: 295 10*3/uL (ref 150–400)
RBC: 3.16 MIL/uL — ABNORMAL LOW (ref 4.22–5.81)
RDW: 20.5 % — ABNORMAL HIGH (ref 11.5–15.5)
WBC: 5.2 10*3/uL (ref 4.0–10.5)
nRBC: 0 % (ref 0.0–0.2)
nRBC: 0 /100 WBC

## 2019-06-11 LAB — MAGNESIUM: Magnesium: 1.7 mg/dL (ref 1.7–2.4)

## 2019-06-11 MED ORDER — STERILE WATER FOR INJECTION IV SOLN
INTRAVENOUS | Status: AC
  Filled 2019-06-11: qty 947.2

## 2019-06-11 MED ORDER — MAGNESIUM SULFATE 2 GM/50ML IV SOLN
2.0000 g | Freq: Once | INTRAVENOUS | Status: AC
  Administered 2019-06-11: 10:00:00 2 g via INTRAVENOUS
  Filled 2019-06-11: qty 50

## 2019-06-11 NOTE — Anesthesia Preprocedure Evaluation (Addendum)
Anesthesia Evaluation  Patient identified by MRN, date of birth, ID band Patient awake    Reviewed: Allergy & Precautions, NPO status , Patient's Chart, lab work & pertinent test results  Airway Mallampati: II  TM Distance: >3 FB Neck ROM: Full    Dental no notable dental hx. (+) Dental Advisory Given, Poor Dentition   Pulmonary neg pulmonary ROS,    Pulmonary exam normal breath sounds clear to auscultation       Cardiovascular hypertension, Pt. on medications Normal cardiovascular exam Rhythm:Regular Rate:Normal     Neuro/Psych negative neurological ROS     GI/Hepatic negative GI ROS, Neg liver ROS,   Endo/Other  diabetes  Renal/GU      Musculoskeletal negative musculoskeletal ROS (+)   Abdominal (+) + obese,   Peds negative pediatric ROS (+)  Hematology negative hematology ROS (+)   Anesthesia Other Findings   Reproductive/Obstetrics                            Anesthesia Physical Anesthesia Plan  ASA: III  Anesthesia Plan: General   Post-op Pain Management:    Induction: Intravenous  PONV Risk Score and Plan: Treatment may vary due to age or medical condition  Airway Management Planned: Oral ETT  Additional Equipment: None  Intra-op Plan:   Post-operative Plan: Extubation in OR  Informed Consent: I have reviewed the patients History and Physical, chart, labs and discussed the procedure including the risks, benefits and alternatives for the proposed anesthesia with the patient or authorized representative who has indicated his/her understanding and acceptance.     Dental advisory given  Plan Discussed with: CRNA  Anesthesia Plan Comments:       Anesthesia Quick Evaluation

## 2019-06-11 NOTE — Progress Notes (Signed)
PHARMACY - TOTAL PARENTERAL NUTRITION CONSULT NOTE  Indication:  Infected pancreatic pseudocyst  Patient Measurements: Height: 5\' 11"  (180.3 cm) Weight: (!) 302 lb 11.1 oz (137.3 kg) IBW/kg (Calculated) : 75.3 TPN AdjBW (KG): 91.7 Body mass index is 42.22 kg/m.  Assessment:  70 YOM presented on 05/12/19 with DKA.  Patient was recently discharged after treatment for DKA and acute pancreatitis.  CT showed worsening pancreatitis and the development of a fluid collection.  Patient was started on tube feeding on 1/28 and tolerated it well through 2/10.  A clear liquid diet was initiated on 05/22/19 but intake was minimal, and was off and on a clear diet until 2/15.  EGD on 2/11 showed esophagitis/duodenitis and patient underwent pancreatic and biliary stent placements on 2/12.  He is s/p necrosectomy for pancreatic pseudocyst/necrosis on 2/15 and again on 2/17.  Feeding tube placement delayed due to frequent procedures.  Patient was advanced to a cardiac diet on 2/15 and intake has been inadequate.  In setting of severe malutrition, Pharmacy consulted to manage TPN.  Glucose / Insulin: hx IDDM admitted with DKA - A1c 13.2%.  CBGs elevated with TPN, required 18 units rSSI + Lantus 10/d  *was on Lantus 45/20 BID and significant amount of SSI while on goal rate TF/oral diet  Electrolytes: K 3.8 (Lasix held), Mag 1.7 (replace), Phos 3.7 Renal: SCr 0.56, BUN WNL LFTs / TGs: LFTs / tbili / TG WNL Prealbumin / albumin: BL prealbumin low at 9.5>8.6, albumin low at 1.5 Intake / Output; MIVF: UOP 1 ml/kg/hr GI Imaging: none since TPN consult Surgeries / Procedures:  2/19 necrosectomy, no gross lesion in esophagus, erythematous mucosa 2/22 necrostomy  Central access: PICC placed 06/05/19 TPN start date: 06/05/19  Nutritional Goals (per RD rec on 2/22): 2300-2500 kCal, 130-145g protein, >2L fluid per day  Current Nutrition:  Heart diet: 0 meals Ensure TID: 1 charted given TPN at least through Friday  2/26- due to need for additional procedure  Plan:   Continue concentrated TPN to goal rate of 80 ml/hr.  TPN provides 142g AA, 326g CHO and 72g ILE for a total of 2398 kCal, meeting ~100% of patient needs Electrolytes in TPN: increase Na, continue K (105 mEq/d), incr Mag and decr Phos. Cl:Ac 1:1 for now Continue daily PO multivitamin (no multivitamin and trace elements in TPN) Continue resistant SSI to Q4H + continue Lantus 10 units daily + continue increased regular insulin in TPN to 35 units Mag 4 gms IV x 1  Calcium carbonate daily per MD F/U AM labs, CBGs, if lasix is restarted, ability to place feeding tube, po intake and discharge nutrition plans  [id number], PharmD, [facility] Clinical Pharmacist Please see AMION for all Pharmacists' Contact Phone Numbers 06/11/2019, 9:00 AM

## 2019-06-11 NOTE — H&P (View-Only) (Signed)
Subjective: ABM pain after cessation of TPN.  Objective: Vital signs in last 24 hours: Temp:  [97.7 F (36.5 C)-98.3 F (36.8 C)] 97.8 F (36.6 C) (02/24 2003) Pulse Rate:  [97-98] 98 (02/24 2003) Resp:  [20] 20 (02/24 2003) BP: (123-175)/(68-86) 151/86 (02/24 2003) SpO2:  [96 %] 96 % (02/24 0748) Weight:  [137.3 kg] 137.3 kg (02/25 0419) Last BM Date: 06/10/19  Intake/Output from previous day: 02/24 0701 - 02/25 0700 In: 720 [P.O.:720] Out: 1650 [Urine:1650] Intake/Output this shift: Total I/O In: -  Out: 200 [Urine:200]  General appearance: alert and no distress GI: soft, non-tender; bowel sounds normal; no masses,  no organomegaly  Lab Results: No results for input(s): WBC, HGB, HCT, PLT in the last 72 hours. BMET Recent Labs    06/09/19 0630 06/10/19 0413  NA 137 136  K 3.9 3.8  CL 109 103  CO2 24 25  GLUCOSE 160* 182*  BUN 8 8  CREATININE 0.64 0.56*  CALCIUM 7.9* 8.2*   LFT No results for input(s): PROT, ALBUMIN, AST, ALT, ALKPHOS, BILITOT, BILIDIR, IBILI in the last 72 hours. PT/INR No results for input(s): LABPROT, INR in the last 72 hours. Hepatitis Panel No results for input(s): HEPBSAG, HCVAB, HEPAIGM, HEPBIGM in the last 72 hours. C-Diff No results for input(s): CDIFFTOX in the last 72 hours. Fecal Lactopherrin No results for input(s): FECLLACTOFRN in the last 72 hours.  Studies/Results: No results found.  Medications:  Scheduled: . sodium chloride   Intravenous Once  . allopurinol  300 mg Oral Daily  . amLODipine  10 mg Oral Daily  . bumetanide  0.5 mg Oral Daily  . chlorhexidine  15 mL Mouth Rinse BID  . Chlorhexidine Gluconate Cloth  6 each Topical Daily  . diphenoxylate-atropine  2 tablet Oral BID  . doxycycline  100 mg Oral Q12H  . enoxaparin (LOVENOX) injection  40 mg Subcutaneous Q24H  . feeding supplement (ENSURE ENLIVE)  237 mL Oral TID BM  . feeding supplement (PRO-STAT SUGAR FREE 64)  30 mL Oral TID  . hydrochlorothiazide   12.5 mg Oral Daily  . insulin aspart  0-20 Units Subcutaneous TID WC  . insulin aspart  0-5 Units Subcutaneous QHS  . insulin glargine  10 Units Subcutaneous Daily  . lipase/protease/amylase  36,000 Units Oral TID AC  . lisinopril  20 mg Oral Daily  . metroNIDAZOLE  500 mg Oral Q8H  . morphine  30 mg Oral Q12H  . multivitamin with minerals  1 tablet Oral Daily  . pantoprazole  40 mg Oral Daily   Continuous:   Assessment/Plan: 1) Severe malnutrition. 2) ABM pain - resolved. 3) Infected pancreatic pseudocyst/WON.   I am not clear why he was discontinued with TPN.  Per nursing, it was in anticipation for discharge, however, he is not quite ready for D/C.  Also he will most likely require TPN at home as his oral intake cannot meet his caloric requirements.  Plan: 1) I will speak with Dr. Arrien to make sure that we are all on the same page. 2) Dr. Mansouraty to repeat necrosectomy tomorrow.  LOS: 30 days   Caleb Chambers D 06/11/2019, 7:00 AM 

## 2019-06-11 NOTE — Progress Notes (Signed)
PROGRESS NOTE    LILLIAN TIGGES  GBT:[id number] DOB: 1965-07-31 DOA: 05/12/2019 PCP: Patient, No Pcp Per    Brief Narrative:  Patient was admitted to the hospital with a working diagnosis of diabetes ketoacidosis complicated by acute kidney injury.  Hospitalization has been complicated by severe necrotizing pancreatitis with pancreatic pseudocyst staph aureus infection  54 year old male who presented with diabetes ketoacidosis.  He does have significant past medical history for hypertension, morbid obesity, type 2 diabetes mellitus and gout.  He had a recent hospitalization for pancreatitis and diabetes ketoacidosis 01/4 to 01/13.  At home patient reported ongoing abdominal pain, and poor oral intake.  Poor compliance with insulin.  On his initial physical examination blood pressure was 96/71, heart rate 102, respiratory rate 23, temperature 98.6, oxygenation 99%.  His lungs were clear to auscultation bilaterally, heart S1-S2 present rhythmic, soft abdomen, right upper quadrant tenderness, no rebound, no lower extremity edema. Sodium 123, potassium 3.6, chloride 87, bicarb 14, glucose 609, BUN 57, creatinine 2.96.  SARS COVID-19 negative.  EKG 98 bpm, left axis deviation, normal intervals, sinus rhythm, no ST segment or T wave changes.  CT of the abdomen with marked severity acute pancreatitis, associated with a very large pancreatic fluid collection.  Patient received supportive medical therapy, repeat CT abdomen showed persistent pancreatitis with new scattered peripancreatic gas, extensive ill-defined peripancreatic fluid extending to the transverse colon to the root of mesentery.    Gastroenterology was consulted and patient underwent upper endoscopic ultrasonography (05/28/19), a AXIOS cystogastrostomy tube was created and 1.2 L of fluid were removed (double-pigtail replaced through AXIOS). Removed pigtails on 06/01/19, through upper endoscopy. Follow up EGD on 02/17, 02/19. On 02/22  double pigtails were replaced.   Cyst culture positive for staph aureus, patient has been receiving TPN and antibiotic therapy with vancomycin and metronidazole.    Assessment & Plan:   Principal Problem:   Pancreatic necrosis Active Problems:   Acute pancreatitis   Acute kidney injury (HCC)   Morbid obesity (HCC)   DKA (diabetic ketoacidosis) (HCC)   Debility   Protein-calorie malnutrition, severe   Infected pancreatic pseudocyst    1. Necrotizing pancreatitis, infected pancreatic pseudocyst (Staph aureus), complicated by DKA present on admission. Patient is sp 5 endoscopic procedures, the more recent on 02.22, documented double pig tails replaced.   Patient is tolerating now regular diet but only eating max 25% of his meals. WBC is 5,2 and patient has remained afebrile.   On oral antibiotic therapy with doxycycline and metronidazole. Will resume TPN and continue supportive medical therapy with hydromorphone,  Pantoprazole, zofran and simethicone.    Consult nutrition for possible supplements.   Possible endoscopic procedure in am, follow with GI recommendations.   2. Uncontrolled T2DM (Hgb A1c 13.2) complicated with DKA. Fasting glucose this am is 201. On basal insulin 10 units of glargine and insulin sliding scale for glucose cover and monitoring. Patient tolerating po well, but no sufficient intake.   3. HTN. On lisinopril, HZTC, amlodipine and bumetanide for blood pressure control.   4. Hypokalemia, hyponatremia and hypomagnesemia. Renal function stable with serum cr at 0,76, K at 3,9 and serum bicarbonate at 25.   5. Calorie protein malnutrition, severe, On nutritional supplements.   DVT prophylaxis: enoxaparin   Code Status: full Family Communication: no family at the bedside  Disposition Plan/ discharge barriers: transfer to medical ward, discontinue telemetry.   Nutrition Status: Nutrition Problem: Severe Malnutrition Etiology: acute  illness(pancreatitis) Signs/Symptoms: energy intake < or equal to  50% for > or equal to 5 days, moderate fat depletion, mild fat depletion, moderate muscle depletion, mild muscle depletion Interventions: Tube feeding    Consultants:   GI   Procedures:   02/11, 02/15, 02/17, 02/22 EGD/ EUS  Antimicrobials:   Doxycycline and metronidazole.     Subjective: Patient only eating max 25% of his meals, no nausea or vomiting, intermittent abdominal pain, continue to have significant gas. No dyspnea or chest pain.   Objective: Vitals:   06/10/19 1620 06/10/19 2003 06/11/19 0419 06/11/19 0720  BP: 123/68 (!) 151/86  140/66  Pulse:  98    Resp:  20    Temp: 98.3 F (36.8 C) 97.8 F (36.6 C)  98.9 F (37.2 C)  TempSrc: Oral Oral  Oral  SpO2:      Weight:   (!) 137.3 kg   Height:        Intake/Output Summary (Last 24 hours) at 06/11/2019 0825 Last data filed at 06/10/2019 2111 Gross per 24 hour  Intake 720 ml  Output 1650 ml  Net -930 ml   Filed Weights   06/09/19 0500 06/10/19 0351 06/11/19 0419  Weight: (!) 141.2 kg (!) 138.7 kg (!) 137.3 kg    Examination:   General: deconditioned  Neurology: Awake and alert, non focal  E ENT: no pallor, no icterus, oral mucosa moist Cardiovascular: No JVD. S1-S2 present, rhythmic, no gallops, rubs, or murmurs. +++ pitting lower extremity edema. Pulmonary:  Positive breath sounds bilaterally, adequate air movement, no wheezing, rhonchi or rales. Gastrointestinal. Abdomen protuberant with no organomegaly, non tender, no rebound or guarding. Mild distended no frank tenderness to palpation but positive discomfort.  Skin. No rashes Musculoskeletal: no joint deformities     Data Reviewed: I have personally reviewed following labs and imaging studies  CBC: Recent Labs  Lab 06/04/19 1245 06/05/19 0306 06/06/19 0325 06/07/19 0412 06/08/19 0404  WBC 8.5 6.3 4.7 4.8 5.2  NEUTROABS  --  4.3 2.9 3.8 3.0  HGB 8.8* 8.0* 7.5* 7.7*  7.9*  HCT 28.2* 25.9* 24.8* 25.6* 25.9*  MCV 81.5 82.2 85.2 85.3 84.4  PLT 407* 387 341 348 321   Basic Metabolic Panel: Recent Labs  Lab 06/06/19 0325 06/06/19 0325 06/07/19 0412 06/08/19 0404 06/09/19 0630 06/10/19 0413 06/11/19 0427  NA 138  --  137 138 137 136  --   K 3.2*  --  3.3* 3.6 3.9 3.8  --   CL 106  --  107 106 109 103  --   CO2 25  --  23 24 24 25   --   GLUCOSE 218*  --  257* 193* 160* 182*  --   BUN 5*  --  5* 6 8 8   --   CREATININE 0.74  --  0.55* 0.64 0.64 0.56*  --   CALCIUM 7.5*  --  7.7* 7.8* 7.9* 8.2*  --   MG 1.6*   < > 1.6* 1.7 1.6* 1.6* 1.7  PHOS 3.3  --  2.9 3.1 3.3 3.7  --    < > = values in this interval not displayed.   GFR: Estimated Creatinine Clearance: 152.9 mL/min (A) (by C-G formula based on SCr of 0.56 mg/dL (L)). Liver Function Tests: Recent Labs  Lab 06/05/19 0306 06/07/19 0412 06/08/19 0404  AST 13* 10* 9*  ALT 13 10 9   ALKPHOS 59 55 52  BILITOT 0.7 0.3 0.2*  PROT 6.3* 6.1* 6.5  ALBUMIN 1.4* 1.4* 1.5*   No results for input(s):  LIPASE, AMYLASE in the last 168 hours. No results for input(s): AMMONIA in the last 168 hours. Coagulation Profile: No results for input(s): INR, PROTIME in the last 168 hours. Cardiac Enzymes: No results for input(s): CKTOTAL, CKMB, CKMBINDEX, TROPONINI in the last 168 hours. BNP (last 3 results) No results for input(s): PROBNP in the last 8760 hours. HbA1C: No results for input(s): HGBA1C in the last 72 hours. CBG: Recent Labs  Lab 06/10/19 0751 06/10/19 1203 06/10/19 1617 06/10/19 2105 06/11/19 0621  GLUCAP 193* 221* 238* 147* 149*   Lipid Profile: No results for input(s): CHOL, HDL, LDLCALC, TRIG, CHOLHDL, LDLDIRECT in the last 72 hours. Thyroid Function Tests: No results for input(s): TSH, T4TOTAL, FREET4, T3FREE, THYROIDAB in the last 72 hours. Anemia Panel: No results for input(s): VITAMINB12, FOLATE, FERRITIN, TIBC, IRON, RETICCTPCT in the last 72 hours.    Radiology Studies:  I have reviewed all of the imaging during this hospital visit personally     Scheduled Meds: . sodium chloride   Intravenous Once  . allopurinol  300 mg Oral Daily  . amLODipine  10 mg Oral Daily  . bumetanide  0.5 mg Oral Daily  . chlorhexidine  15 mL Mouth Rinse BID  . Chlorhexidine Gluconate Cloth  6 each Topical Daily  . diphenoxylate-atropine  2 tablet Oral BID  . doxycycline  100 mg Oral Q12H  . enoxaparin (LOVENOX) injection  40 mg Subcutaneous Q24H  . feeding supplement (ENSURE ENLIVE)  237 mL Oral TID BM  . feeding supplement (PRO-STAT SUGAR FREE 64)  30 mL Oral TID  . hydrochlorothiazide  12.5 mg Oral Daily  . insulin aspart  0-20 Units Subcutaneous TID WC  . insulin aspart  0-5 Units Subcutaneous QHS  . insulin glargine  10 Units Subcutaneous Daily  . lipase/protease/amylase  36,000 Units Oral TID AC  . lisinopril  20 mg Oral Daily  . metroNIDAZOLE  500 mg Oral Q8H  . morphine  30 mg Oral Q12H  . multivitamin with minerals  1 tablet Oral Daily  . pantoprazole  40 mg Oral Daily   Continuous Infusions:   LOS: 30 days        Cheyne Boulden Gerome Apley, MD

## 2019-06-11 NOTE — Progress Notes (Signed)
Nutrition Follow up   DOCUMENTATION CODES:   Severe malnutrition in context of acute illness/injury  INTERVENTION:   Recommend continuation of TPN to meet 100% of needs until PO intake progresses/remains consistent (at 75%) or small bore feeding tube can be replaced as pt is severely malnourished.     Ensure Enlive po TID, each supplement provides 350 kcal and 20 grams of protein  30 ml Prostat TID, each supplement provides 100 kcals and 15 grams protein.   Magic cup TID with meals, each supplement provides 290 kcal and 9 grams of protein  MVI daily   NUTRITION DIAGNOSIS:   Severe Malnutrition related to acute illness(pancreatitis) as evidenced by energy intake < or equal to 50% for > or equal to 5 days, moderate fat depletion, mild fat depletion, moderate muscle depletion, mild muscle depletion.  Ongoing  GOAL:   Patient will meet greater than or equal to 90% of their needs  Met with TPN  MONITOR:   TF tolerance, Weight trends, Labs, I & O's  REASON FOR ASSESSMENT:   Consult Assessment of nutrition requirement/status  ASSESSMENT:   Pt with a PMH significant for HTN, DM, gout, and recent admission for pancreatitis and DKA presented to ED with DKA, abdominal pain, SOB, and minimal PO intake since last admission.   1/28- s/p post pyloric small bore feeding tube placement (reads at the LOT) 2/11- s/p endoscopic cyst drainage, Cortrak removed during procedure  2/15- s/p endoscopic necrosectomy  2/17- s/p endoscopic necrosectomy  2/19- s/p endoscopic necrosectomy  2/22- s/p endoscopic necrosectomy   Intake remains inconsistent. Meal completions charted as 0-75% for his last eight meals (28% average). Not taking Ensure or Prostat. RD encouraged meal intake again.   TPN d/c yesterday? Restarted today per GI. Recommend continuing to meet 100% of needs until PO intake progresses. Per GI pt may require home TPN. If pt has a longer break between endoscopic procedures may  consider enteral nutrition.   Admission weight: 133.3 kg  Current weight: 137.7  kg (down 5 kg over the last week)  I/O: -584 ml since 2/11 UOP: 1,650 ml x 24 hrs   Medications: lomotil, 20 mg lasix BID, lantus, creon, MVI with minerals, Mg sulfate  Labs: CBG 147-238  Diet Order:   Diet Order            Diet Heart Room service appropriate? Yes; Fluid consistency: Thin  Diet effective now              EDUCATION NEEDS:   Not appropriate for education at this time  Skin:  Skin Assessment: Reviewed RN Assessment  Last BM:  2/24  Height:   Ht Readings from Last 1 Encounters:  06/08/19 5' 11" (1.803 m)    Weight:   Wt Readings from Last 1 Encounters:  06/11/19 (!) 137.3 kg    Ideal Body Weight:  80.9 kg  BMI:  Body mass index is 42.22 kg/m.  Estimated Nutritional Needs:   Kcal:  2300-2500  Protein:  130-145 grams  Fluid:  >2L/d    RD, LDN Clinical Nutrition Pager # - [phone]   

## 2019-06-11 NOTE — Progress Notes (Signed)
Subjective: ABM pain after cessation of TPN.  Objective: Vital signs in last 24 hours: Temp:  [97.7 F (36.5 C)-98.3 F (36.8 C)] 97.8 F (36.6 C) (02/24 2003) Pulse Rate:  [97-98] 98 (02/24 2003) Resp:  [20] 20 (02/24 2003) BP: (123-175)/(68-86) 151/86 (02/24 2003) SpO2:  [96 %] 96 % (02/24 0748) Weight:  [137.3 kg] 137.3 kg (02/25 0419) Last BM Date: 06/10/19  Intake/Output from previous day: 02/24 0701 - 02/25 0700 In: 720 [P.O.:720] Out: 1650 [Urine:1650] Intake/Output this shift: Total I/O In: -  Out: 200 [Urine:200]  General appearance: alert and no distress GI: soft, non-tender; bowel sounds normal; no masses,  no organomegaly  Lab Results: No results for input(s): WBC, HGB, HCT, PLT in the last 72 hours. BMET Recent Labs    06/09/19 0630 06/10/19 0413  NA 137 136  K 3.9 3.8  CL 109 103  CO2 24 25  GLUCOSE 160* 182*  BUN 8 8  CREATININE 0.64 0.56*  CALCIUM 7.9* 8.2*   LFT No results for input(s): PROT, ALBUMIN, AST, ALT, ALKPHOS, BILITOT, BILIDIR, IBILI in the last 72 hours. PT/INR No results for input(s): LABPROT, INR in the last 72 hours. Hepatitis Panel No results for input(s): HEPBSAG, HCVAB, HEPAIGM, HEPBIGM in the last 72 hours. C-Diff No results for input(s): CDIFFTOX in the last 72 hours. Fecal Lactopherrin No results for input(s): FECLLACTOFRN in the last 72 hours.  Studies/Results: No results found.  Medications:  Scheduled: . sodium chloride   Intravenous Once  . allopurinol  300 mg Oral Daily  . amLODipine  10 mg Oral Daily  . bumetanide  0.5 mg Oral Daily  . chlorhexidine  15 mL Mouth Rinse BID  . Chlorhexidine Gluconate Cloth  6 each Topical Daily  . diphenoxylate-atropine  2 tablet Oral BID  . doxycycline  100 mg Oral Q12H  . enoxaparin (LOVENOX) injection  40 mg Subcutaneous Q24H  . feeding supplement (ENSURE ENLIVE)  237 mL Oral TID BM  . feeding supplement (PRO-STAT SUGAR FREE 64)  30 mL Oral TID  . hydrochlorothiazide   12.5 mg Oral Daily  . insulin aspart  0-20 Units Subcutaneous TID WC  . insulin aspart  0-5 Units Subcutaneous QHS  . insulin glargine  10 Units Subcutaneous Daily  . lipase/protease/amylase  36,000 Units Oral TID AC  . lisinopril  20 mg Oral Daily  . metroNIDAZOLE  500 mg Oral Q8H  . morphine  30 mg Oral Q12H  . multivitamin with minerals  1 tablet Oral Daily  . pantoprazole  40 mg Oral Daily   Continuous:   Assessment/Plan: 1) Severe malnutrition. 2) ABM pain - resolved. 3) Infected pancreatic pseudocyst/WON.   I am not clear why he was discontinued with TPN.  Per nursing, it was in anticipation for discharge, however, he is not quite ready for D/C.  Also he will most likely require TPN at home as his oral intake cannot meet his caloric requirements.  Plan: 1) I will speak with Dr. Ella Jubilee to make sure that we are all on the same page. 2) Dr. Meridee Score to repeat necrosectomy tomorrow.  LOS: 30 days   Damaree Sargent D 06/11/2019, 7:00 AM

## 2019-06-12 ENCOUNTER — Encounter (HOSPITAL_COMMUNITY)
Admission: EM | Disposition: A | Discharge status: Home Health | Payer: Self-pay | Source: Home / Self Care | Attending: Internal Medicine

## 2019-06-12 ENCOUNTER — Encounter (HOSPITAL_COMMUNITY): Payer: Self-pay | Admitting: Internal Medicine

## 2019-06-12 ENCOUNTER — Inpatient Hospital Stay (HOSPITAL_COMMUNITY): Payer: BC Managed Care – PPO | Admitting: Anesthesiology

## 2019-06-12 HISTORY — PX: ENDOROTOR: SHX6859

## 2019-06-12 HISTORY — PX: STENT REMOVAL: SHX6421

## 2019-06-12 HISTORY — PX: PANCREATIC STENT PLACEMENT: SHX5539

## 2019-06-12 HISTORY — PX: ESOPHAGOGASTRODUODENOSCOPY: SHX5428

## 2019-06-12 LAB — DIFFERENTIAL
Abs Immature Granulocytes: 0.01 10*3/uL (ref 0.00–0.07)
Basophils Absolute: 0 10*3/uL (ref 0.0–0.1)
Basophils Relative: 1 %
Eosinophils Absolute: 0 10*3/uL (ref 0.0–0.5)
Eosinophils Relative: 0 %
Immature Granulocytes: 0 %
Lymphocytes Relative: 24 %
Lymphs Abs: 1.4 10*3/uL (ref 0.7–4.0)
Monocytes Absolute: 0.8 10*3/uL (ref 0.1–1.0)
Monocytes Relative: 13 %
Neutro Abs: 3.7 10*3/uL (ref 1.7–7.7)
Neutrophils Relative %: 62 %

## 2019-06-12 LAB — CBC
HCT: 25.5 % — ABNORMAL LOW (ref 39.0–52.0)
Hemoglobin: 7.8 g/dL — ABNORMAL LOW (ref 13.0–17.0)
MCH: 25.8 pg — ABNORMAL LOW (ref 26.0–34.0)
MCHC: 30.6 g/dL (ref 30.0–36.0)
MCV: 84.4 fL (ref 80.0–100.0)
Platelets: 250 10*3/uL (ref 150–400)
RBC: 3.02 MIL/uL — ABNORMAL LOW (ref 4.22–5.81)
RDW: 19.9 % — ABNORMAL HIGH (ref 11.5–15.5)
WBC: 6 10*3/uL (ref 4.0–10.5)
nRBC: 0 % (ref 0.0–0.2)

## 2019-06-12 LAB — COMPREHENSIVE METABOLIC PANEL
ALT: 8 U/L (ref 0–44)
AST: 11 U/L — ABNORMAL LOW (ref 15–41)
Albumin: 1.6 g/dL — ABNORMAL LOW (ref 3.5–5.0)
Alkaline Phosphatase: 61 U/L (ref 38–126)
Anion gap: 7 (ref 5–15)
BUN: 7 mg/dL (ref 6–20)
CO2: 24 mmol/L (ref 22–32)
Calcium: 7.9 mg/dL — ABNORMAL LOW (ref 8.9–10.3)
Chloride: 98 mmol/L (ref 98–111)
Creatinine, Ser: 0.68 mg/dL (ref 0.61–1.24)
GFR calc Af Amer: >60 mL/min (ref >60)
GFR calc non Af Amer: >60 mL/min (ref >60)
Glucose, Bld: 254 mg/dL — ABNORMAL HIGH (ref 70–99)
Potassium: 3.7 mmol/L (ref 3.5–5.1)
Sodium: 129 mmol/L — ABNORMAL LOW (ref 135–145)
Total Bilirubin: 0.2 mg/dL — ABNORMAL LOW (ref 0.3–1.2)
Total Protein: 6.9 g/dL (ref 6.5–8.1)

## 2019-06-12 LAB — GLUCOSE, CAPILLARY
Glucose-Capillary: 193 mg/dL — ABNORMAL HIGH (ref 70–99)
Glucose-Capillary: 248 mg/dL — ABNORMAL HIGH (ref 70–99)
Glucose-Capillary: 264 mg/dL — ABNORMAL HIGH (ref 70–99)
Glucose-Capillary: 265 mg/dL — ABNORMAL HIGH (ref 70–99)
Glucose-Capillary: 268 mg/dL — ABNORMAL HIGH (ref 70–99)
Glucose-Capillary: 275 mg/dL — ABNORMAL HIGH (ref 70–99)
Glucose-Capillary: 289 mg/dL — ABNORMAL HIGH (ref 70–99)

## 2019-06-12 LAB — MAGNESIUM: Magnesium: 1.7 mg/dL (ref 1.7–2.4)

## 2019-06-12 LAB — PREALBUMIN: Prealbumin: 8.3 mg/dL — ABNORMAL LOW (ref 18–38)

## 2019-06-12 LAB — PHOSPHORUS: Phosphorus: 3.3 mg/dL (ref 2.5–4.6)

## 2019-06-12 LAB — TRIGLYCERIDES: Triglycerides: 102 mg/dL (ref <150)

## 2019-06-12 SURGERY — EGD (ESOPHAGOGASTRODUODENOSCOPY)
Anesthesia: General

## 2019-06-12 MED ORDER — FENTANYL CITRATE (PF) 100 MCG/2ML IJ SOLN
INTRAMUSCULAR | Status: DC | PRN
  Administered 2019-06-12: 100 ug via INTRAVENOUS

## 2019-06-12 MED ORDER — CIPROFLOXACIN IN D5W 400 MG/200ML IV SOLN
INTRAVENOUS | Status: AC
  Filled 2019-06-12: qty 200

## 2019-06-12 MED ORDER — MAGNESIUM SULFATE 2 GM/50ML IV SOLN
2.0000 g | Freq: Once | INTRAVENOUS | Status: AC
  Administered 2019-06-12: 13:00:00 2 g via INTRAVENOUS
  Filled 2019-06-12 (×2): qty 50

## 2019-06-12 MED ORDER — LACTATED RINGERS IV SOLN: INTRAVENOUS | Status: DC

## 2019-06-12 MED ORDER — MIDAZOLAM HCL 5 MG/5ML IJ SOLN
INTRAMUSCULAR | Status: DC | PRN
  Administered 2019-06-12: 1 mg via INTRAVENOUS

## 2019-06-12 MED ORDER — SUGAMMADEX SODIUM 200 MG/2ML IV SOLN
INTRAVENOUS | Status: DC | PRN
  Administered 2019-06-12: 200 mg via INTRAVENOUS

## 2019-06-12 MED ORDER — LIDOCAINE 2% (20 MG/ML) 5 ML SYRINGE
INTRAMUSCULAR | Status: DC | PRN
  Administered 2019-06-12: 100 mg via INTRAVENOUS

## 2019-06-12 MED ORDER — ROCURONIUM BROMIDE 100 MG/10ML IV SOLN
INTRAVENOUS | Status: DC | PRN
  Administered 2019-06-12: 70 mg via INTRAVENOUS

## 2019-06-12 MED ORDER — SODIUM CHLORIDE 0.9 % IV SOLN: INTRAVENOUS | Status: DC

## 2019-06-12 MED ORDER — CIPROFLOXACIN IN D5W 400 MG/200ML IV SOLN
INTRAVENOUS | Status: DC | PRN
  Administered 2019-06-12: 400 mg via INTRAVENOUS

## 2019-06-12 MED ORDER — PROPOFOL 10 MG/ML IV BOLUS
INTRAVENOUS | Status: DC | PRN
  Administered 2019-06-12: 150 mg via INTRAVENOUS

## 2019-06-12 MED ORDER — ONDANSETRON HCL 4 MG/2ML IJ SOLN
INTRAMUSCULAR | Status: DC | PRN
  Administered 2019-06-12: 4 mg via INTRAVENOUS

## 2019-06-12 MED ORDER — STERILE WATER FOR INJECTION IV SOLN
INTRAVENOUS | Status: AC
  Filled 2019-06-12: qty 947.2

## 2019-06-12 NOTE — Anesthesia Procedure Notes (Signed)
Procedure Name: Intubation Date/Time: 06/12/2019 11:03 AM Performed by: Candis Shine, CRNA Pre-anesthesia Checklist: Patient identified, Emergency Drugs available, Suction available and Patient being monitored Patient Re-evaluated:Patient Re-evaluated prior to induction Oxygen Delivery Method: Circle System Utilized Preoxygenation: Pre-oxygenation with 100% oxygen Induction Type: IV induction Ventilation: Mask ventilation without difficulty and Oral airway inserted - appropriate to patient size Laryngoscope Size: Mac and 4 Grade View: Grade I Tube type: Oral Tube size: 7.5 mm Number of attempts: 1 Airway Equipment and Method: Stylet and Oral airway Placement Confirmation: ETT inserted through vocal cords under direct vision,  positive ETCO2 and breath sounds checked- equal and bilateral Secured at: 22 cm Tube secured with: Tape Dental Injury: Teeth and Oropharynx as per pre-operative assessment

## 2019-06-12 NOTE — TOC Progression Note (Signed)
Transition of Care [facility]) - Progression Note    Patient Details  Name: [name] MRN: [id number] Date of Birth: [birth year]  Transition of Care [facility]) CM/SW Contact  Leone Haven, RN Phone Number: 06/12/2019, 3:47 PM  Clinical Narrative:    NCM spoke with patient to see who he wanted for [facility], he states he does not have a preference, NCM made referral to [facility] with Frances Furbish, he is able to take referral for [facility], and NCM made referral to Jeri Modena for TPN medication.     Expected Discharge Plan: Home w Home Health Services Barriers to Discharge: Continued Medical Work up  Expected Discharge Plan and Services Expected Discharge Plan: Home w Home Health Services   Discharge Planning Services: CM Consult Post Acute Care Choice: Home Health Living arrangements for the past 2 months: Single Family Home                 DME Arranged: Walker rolling, Bedside commode DME Agency: [employer] Date DME Agency Contacted: 06/09/19 Time DME Agency Contacted: [phone] Representative spoke with at DME Agency: zach             Social Determinants of Health (SDOH) Interventions    Readmission Risk Interventions Readmission Risk Prevention Plan 06/09/2019  Transportation Screening Complete  Medication Review Oceanographer) Complete  SW Recovery Care/Counseling Consult Complete  Palliative Care Screening Not Applicable  Skilled Nursing Facility Patient Refused  Some recent data might be hidden

## 2019-06-12 NOTE — Progress Notes (Signed)
PHARMACY - TOTAL PARENTERAL NUTRITION CONSULT NOTE  Indication:  Infected pancreatic pseudocyst  Patient Measurements: Height: 5\' 11"  (180.3 cm) Weight: 297 lb 6.4 oz (134.9 kg) IBW/kg (Calculated) : 75.3 TPN AdjBW (KG): 91.7 Body mass index is 41.48 kg/m.  Assessment:  18 YOM presented on 05/12/19 with DKA.  Patient was recently discharged after treatment for DKA and acute pancreatitis.  CT showed worsening pancreatitis and the development of a fluid collection.  Patient was started on tube feeding on 1/28 and tolerated it well through 2/10.  A clear liquid diet was initiated on 05/22/19 but intake was minimal, and was off and on a clear diet until 2/15.  EGD on 2/11 showed esophagitis/duodenitis and patient underwent pancreatic and biliary stent placements on 2/12.  He is s/p necrosectomy for pancreatic pseudocyst/necrosis on 2/15 and again on 2/17.  Feeding tube placement delayed due to frequent procedures.  Patient was advanced to a cardiac diet on 2/15 and intake has been inadequate.  In setting of severe malutrition, Pharmacy consulted to manage TPN.  Glucose / Insulin: hx IDDM admitted with DKA - A1c 13.2%.  CBGs elevated (250-270s) with 35 units of insulin in TPN, required 11 units rSSI + Lantus 10/d  *was on Lantus 45/20 BID and significant amount of SSI while on goal rate TF/oral diet  Electrolytes: Na down 129, K 3.7 (on Bumex), Mag 1.7 (after replacement), Phos 3.3, CoCa 9.8 Renal: SCr 0.68, BUN WNL. LFTs / TGs: LFTs / tbili / TG within normal limits Prealbumin / albumin: BL prealbumin low at 9.5>8.6, albumin low at 1.6 Intake / Output; MIVF: 270/1050 (-780 mL), UOP down at 0.3 ml/kg/hr; LBM 2/25 x2 -- added additional MIVF of LR at 125 ml/hr GI Imaging: none since TPN consult Surgeries / Procedures:  2/19 necrosectomy, no gross lesion in esophagus, erythematous mucosa 2/22 necrosectomy 2/26 repeat necrosectomy  Central access: PICC placed 06/05/19 TPN start date:  06/05/19  Nutritional Goals (per RD rec on 2/25): 2300-2500 kCal, 130-145g protein, >2L fluid per day  Current Nutrition:  NPO TPN   Plan:   Continue concentrated TPN to goal rate of 80 ml/hr.  TPN provides 142g AA, 326g CHO and 72g ILE for a total of 2398 kCal, meeting ~100% of patient needs Electrolytes in TPN: increase Na, continue K (105 mEq/d), incr Mag and decr Phos. Cl:Ac 1:1 for now Continue daily PO multivitamin (no multivitamin and trace elements in TPN)  Continue resistant SSI to Q4H + continue increased regular insulin in TPN bag to 50 units Discontinue Lantus 10 units daily -- will give all insulin in TPN as this is safer and will help prevent issues if TPN were held for some reason as well as prevent lows when transition to cyclic TPN.   Give Magnesium 2g IV x1 today Calcium carbonate daily per MD F/U AM labs, CBGs, ability to place feeding tube, po intake and discharge nutrition plans  Consider if small bore tube could be placed post-operatively to allow for enteral feeds? If plan remains for home TPN - will start cycling TPN soon.  [id number], PharmD, BCPS, BCCCP Clinical Pharmacist Clinical phone 06/12/2019 until 3PM [phone] Please refer to [facility] for [facility] Pharmacy numbers 06/12/2019, 11:05 AM

## 2019-06-12 NOTE — Interval H&P Note (Signed)
History and Physical Interval Note:  06/12/2019 10:14 AM  Caleb Chambers  has presented today for surgery, with the diagnosis of Pancreatic Necrosis.  The various methods of treatment have been discussed with the patient and family. After consideration of risks, benefits and other options for treatment, the patient has consented to  Procedure(s): ESOPHAGOGASTRODUODENOSCOPY (EGD) with NECROSECTOMY (N/A) ENDOROTOR (N/A) as a surgical intervention.  The patient's history has been reviewed, patient examined, no change in status, stable for surgery.  I have reviewed the patient's chart and labs.  Questions were answered to the patient's satisfaction.     [employer]

## 2019-06-12 NOTE — Progress Notes (Addendum)
PROGRESS NOTE    Caleb Chambers  JEH:[id number] DOB: 08/26/1965 DOA: 05/12/2019 PCP: Patient, No Pcp Per    Brief Narrative:  Patient was admitted to the hospital with a working diagnosis of diabetes ketoacidosis complicated by acute kidney injury.Hospitalization has been complicated by severe necrotizing pancreatitis with pancreatic pseudocyst staph aureus infection  54 year old male who presented with diabetes ketoacidosis. He does have significant past medical history for hypertension, morbid obesity, type 2 diabetes mellitus and gout. He had a recent hospitalization for pancreatitis and diabetes ketoacidosis 01/4 to 01/13.At home patient reported ongoing abdominal pain, and poor oral intake. Poor compliance with insulin. On his initial physical examination blood pressure was 96/71, heart rate 102, respiratory rate 23, temperature 98.6, oxygenation 99%.His lungs were clear to auscultation bilaterally, heart S1-S2 present rhythmic, soft abdomen, right upper quadrant tenderness, no rebound, no lower extremity edema. Sodium 123, potassium 3.6, chloride 87, bicarb 14, glucose 609, BUN 57, creatinine 2.96.SARS COVID-19 negative.EKG 98 bpm, left axis deviation, normal intervals, sinus rhythm, no ST segment or T wave changes.  CT of the abdomen with marked severity acute pancreatitis, associated with a very large pancreatic fluid collection.  Patient received supportive medical therapy, repeat CT abdomen showed persistent pancreatitis with new scattered peripancreatic gas, extensive ill-defined peripancreatic fluid extending to the transverse colon to the root of mesentery.   Gastroenterology was consulted and patient underwent upper endoscopic ultrasonography(05/28/19),aAXIOScystogastrostomy tube was created and 1.2 L of fluid were removed(double-pigtail replaced throughAXIOS).Removed pigtails on 06/01/19, through upper endoscopy. Follow up EGD on 02/17, 02/19. On 02/22  double pigtails were replaced.  Cyst culture positive for staph aureus, patient has been receiving TPN and antibiotic therapy with vancomycinand metronidazole.   Assessment & Plan:   Principal Problem:   Pancreatic necrosis Active Problems:   Acute pancreatitis   Acute kidney injury (HCC)   Morbid obesity (HCC)   DKA (diabetic ketoacidosis) (HCC)   Debility   Protein-calorie malnutrition, severe   Infected pancreatic pseudocyst    1. Necrotizing pancreatitis, infected pancreatic pseudocyst (Staph aureus), complicated by DKA present on admission. Patient is sp 5 endoscopic procedures, the more recent on 02.22, documented double pig tails replaced.   TPN has been resumed, patient is tolerating regular diet (eating max 25% ). WBC is 6.0. no significant abdominal pain.   On doxycycline and metronidazole. Continue supportive therapy with analgesics, antiacids and antiemetics. Follow with nutrition recommendations.   Upper endoscopy today, double pigtails stents removed and performed necrosectomy procedure.   2. Uncontrolled T2DM (Hgb A1c 13.2) complicated with DKA. Fasting glucose this am is 254. Insulin per TPN plus continue with insulin sliding scale for glucose cover and monitoring.   Patient is back on TPN.   3. HTN. Continue blood pressure control with lisinopril, HZTC, amlodipine and bumetanide.   4. Hypokalemia, hyponatremia and hypomagnesemia. Renal function stable with serum cr at 0,68, K at 3,7 and serum bicarbonate at 24. New hyponatremia down to 129. Patient tolerating po and continue TPN for electrolyte correction.    5. Calorie protein malnutrition, severe, Continue with nutritional supplements.  DVT prophylaxis:enoxaparin Code Status:full Family Communication:no family at the bedside Disposition Plan/ discharge barriers:pending GI sign off.   Nutrition Status: Nutrition Problem: Severe Malnutrition Etiology: acute  illness(pancreatitis) Signs/Symptoms: energy intake < or equal to 50% for > or equal to 5 days, moderate fat depletion, mild fat depletion, moderate muscle depletion, mild muscle depletion Interventions: Tube feeding   Consultants:  GI  Procedures:  02/11, 02/15, 02/17, 02/22 EGD/ EUS  02/26  Antimicrobials:  Doxycycline and metronidazole. Subjective: Patient continue to have mild to moderate abdominal discomfort, one episode of vomiting, continue to have gas. No dyspnea or chest pain.    Objective: Vitals:   06/11/19 1601 06/11/19 1921 06/12/19 0628 06/12/19 0756  BP: (!) 149/83 (!) 175/64  (!) 147/71  Pulse:  94    Resp:  16  18  Temp: 98.7 F (37.1 C) 98.3 F (36.8 C)  98.4 F (36.9 C)  TempSrc: Oral Oral  Oral  SpO2:  98%    Weight:   134.9 kg   Height:        Intake/Output Summary (Last 24 hours) at 06/12/2019 0831 Last data filed at 06/12/2019 0340 Gross per 24 hour  Intake 270 ml  Output 1050 ml  Net -780 ml   Filed Weights   06/10/19 0351 06/11/19 0419 06/12/19 0628  Weight: (!) 138.7 kg (!) 137.3 kg 134.9 kg    Examination:   General: Not in pain or dyspnea. Deconditioned  Neurology: Awake and alert, non focal  E ENT: mild pallor, no icterus, oral mucosa moist Cardiovascular: No JVD. S1-S2 present, rhythmic, no gallops, rubs, or murmurs. ++/+++ pitting lower extremity edema. Pulmonary: positive breath sounds bilaterally, adequate air movement, no wheezing, rhonchi or rales. Gastrointestinal. Abdomen with no organomegaly, non tender, no rebound or guarding. Mild distention.  Skin. No rashes Musculoskeletal: no joint deformities     Data Reviewed: I have personally reviewed following labs and imaging studies  CBC: Recent Labs  Lab 06/06/19 0325 06/07/19 0412 06/08/19 0404 06/11/19 0950 06/12/19 0352  WBC 4.7 4.8 5.2 5.2 6.0  NEUTROABS 2.9 3.8 3.0 3.6 3.7  HGB 7.5* 7.7* 7.9* 8.0* 7.8*  HCT 24.8* 25.6* 25.9* 27.1* 25.5*  MCV 85.2  85.3 84.4 85.8 84.4  PLT 341 348 321 295 250   Basic Metabolic Panel: Recent Labs  Lab 06/07/19 0412 06/07/19 0412 06/08/19 0404 06/09/19 0630 06/10/19 0413 06/11/19 0427 06/11/19 0950 06/12/19 0352  NA 137   < > 138 137 136  --  135 129*  K 3.3*   < > 3.6 3.9 3.8  --  3.9 3.7  CL 107   < > 106 109 103  --  103 98  CO2 23   < > 24 24 25   --  25 24  GLUCOSE 257*   < > 193* 160* 182*  --  201* 254*  BUN 5*   < > 6 8 8   --  6 7  CREATININE 0.55*   < > 0.64 0.64 0.56*  --  0.76 0.68  CALCIUM 7.7*   < > 7.8* 7.9* 8.2*  --  8.1* 7.9*  MG 1.6*   < > 1.7 1.6* 1.6* 1.7  --  1.7  PHOS 2.9  --  3.1 3.3 3.7  --   --  3.3   < > = values in this interval not displayed.   GFR: Estimated Creatinine Clearance: 151.4 mL/min (by C-G formula based on SCr of 0.68 mg/dL). Liver Function Tests: Recent Labs  Lab 06/07/19 0412 06/08/19 0404 06/12/19 0352  AST 10* 9* 11*  ALT 10 9 8   ALKPHOS 55 52 61  BILITOT 0.3 0.2* 0.2*  PROT 6.1* 6.5 6.9  ALBUMIN 1.4* 1.5* 1.6*   No results for input(s): LIPASE, AMYLASE in the last 168 hours. No results for input(s): AMMONIA in the last 168 hours. Coagulation Profile: No results for input(s): INR, PROTIME in the last 168 hours. Cardiac Enzymes:  No results for input(s): CKTOTAL, CKMB, CKMBINDEX, TROPONINI in the last 168 hours. BNP (last 3 results) No results for input(s): PROBNP in the last 8760 hours. HbA1C: No results for input(s): HGBA1C in the last 72 hours. CBG: Recent Labs  Lab 06/11/19 1605 06/11/19 1921 06/12/19 0004 06/12/19 0337 06/12/19 0729  GLUCAP 156* 141* 193* 248* 265*   Lipid Profile: Recent Labs    06/12/19 0352  TRIG 102   Thyroid Function Tests: No results for input(s): TSH, T4TOTAL, FREET4, T3FREE, THYROIDAB in the last 72 hours. Anemia Panel: No results for input(s): VITAMINB12, FOLATE, FERRITIN, TIBC, IRON, RETICCTPCT in the last 72 hours.    Radiology Studies: I have reviewed all of the imaging during this  hospital visit personally     Scheduled Meds: . sodium chloride   Intravenous Once  . allopurinol  300 mg Oral Daily  . amLODipine  10 mg Oral Daily  . bumetanide  0.5 mg Oral Daily  . chlorhexidine  15 mL Mouth Rinse BID  . Chlorhexidine Gluconate Cloth  6 each Topical Daily  . diphenoxylate-atropine  2 tablet Oral BID  . doxycycline  100 mg Oral Q12H  . enoxaparin (LOVENOX) injection  40 mg Subcutaneous Q24H  . feeding supplement (ENSURE ENLIVE)  237 mL Oral TID BM  . feeding supplement (PRO-STAT SUGAR FREE 64)  30 mL Oral TID  . hydrochlorothiazide  12.5 mg Oral Daily  . insulin aspart  0-20 Units Subcutaneous TID WC  . insulin aspart  0-5 Units Subcutaneous QHS  . insulin glargine  10 Units Subcutaneous Daily  . lipase/protease/amylase  36,000 Units Oral TID AC  . lisinopril  20 mg Oral Daily  . metroNIDAZOLE  500 mg Oral Q8H  . morphine  30 mg Oral Q12H  . multivitamin with minerals  1 tablet Oral Daily  . pantoprazole  40 mg Oral Daily   Continuous Infusions: . TPN ADULT (ION) 80 mL/hr at 06/11/19 1754     LOS: 31 days        Carmalita Wakefield Gerome Apley, MD

## 2019-06-12 NOTE — Op Note (Signed)
[facility] Patient Name: Caleb Chambers Procedure Date : 06/12/2019 MRN: [id number] Attending MD: Justice Britain , MD Date of Birth: 04-11-1966 CSN: [id number] Age: 54 Admit Type: Inpatient Procedure:                Upper GI endoscopy Indications:              Pancreatic necrosis Providers:                Justice Britain, MD, Carlyn Reichert, RN, Lazaro Arms, Technician Referring MD:             Carol Ada, MD, [facility] Medicines:                General Anesthesia, Cipro 637 mg IV Complications:            No immediate complications. Estimated Blood Loss:     Estimated blood loss was minimal. Procedure:                Pre-Anesthesia Assessment:                           - Prior to the procedure, a History and Physical                            was performed, and patient medications and                            allergies were reviewed. The patient's tolerance of                            previous anesthesia was also reviewed. The risks                            and benefits of the procedure and the sedation                            options and risks were discussed with the patient.                            All questions were answered, and informed consent                            was obtained. Prior Anticoagulants: The patient has                            taken no previous anticoagulant or antiplatelet                            agents. ASA Grade Assessment: III - A patient with                            severe systemic disease. After reviewing the risks  and benefits, the patient was deemed in                            satisfactory condition to undergo the procedure.                           After obtaining informed consent, the endoscope was                            passed under direct vision. Throughout the                            procedure, the patient's blood pressure, pulse,  and                            oxygen saturations were monitored continuously. The                            [id number] ([id number]) Olympus therapeutic                            gastroscope was introduced through the mouth, and                            advanced to the second part of duodenum. The upper                            GI endoscopy was accomplished without difficulty.                            The patient tolerated the procedure. Scope In: Scope Out: Findings:      No gross lesions were noted in the entire esophagus.      LA Grade A (one or more mucosal breaks less than 5 mm, not extending       between tops of 2 mucosal folds) esophagitis with no bleeding was found       at the gastroesophageal junction.      A previously placed AXIOS cystgastrostomy stent was found on the       posterior wall of the stomach with 2 double pigtail stents within it.       The two double pigtail stents were removed from the cystgastrostomy with       a snare. The cyst was partially filled with fluid and black necrotic       tissue that was pasty and adherent to the cyst wall. Necrosectomy was       performed with a Raptor grasping device, snare and Endorotor Powered       Endoscopic Debrider, requiring multiple intubations of the cyst. At the       conclusion of the procedure, a medium amount of necrotic tissue and a       large amount of pink, viable tissue was found within the       pseudocyst/walled-off necrosis on direct vision. Two 10 French x 5 cm       double pigtail stents were inserted across the AXIOS cystgastrostomy       tract.  Patchy mildly erythematous mucosa without bleeding was found in the       gastric body.      No gross lesions were noted in the duodenal bulb, in the first portion       of the duodenum and in the second portion of the duodenum. Impression:               - No gross lesions in esophagus. LA Grade A                            esophagitis at the [employer] with no bleeding.                           - Pre-existing AXIOS cystgastrostomy stent with                            double pigtails present. Double pigtail stents                            removed. Necrosectomy was performed. Double pigtail                            stents removed.                           - Erythematous mucosa in the gastric body.                           - No gross lesions in the duodenal bulb, in the                            first portion of the duodenum and in the second                            portion of the duodenum. Recommendation:           - The patient will be observed post-procedure,                            until all discharge criteria are met.                           - Return patient to hospital ward for ongoing care.                           - Observe patient's clinical course.                           - Advance diet as tolerated.                           - Continue antibiotics until stent removal.                           - Observe patient's clinical course.                           -  If we can get Anesthesia availability, will                            attempt to do next Necrosectomy on Monday otherwise                            will be Wednesday.                           - The findings and recommendations were discussed                            with the patient.                           - The findings and recommendations were discussed                            with the patient's family. Procedure Code(s):        --- Professional ---                           [phone], Esophagogastroduodenoscopy, flexible,                            transoral; diagnostic, including collection of                            specimen(s) by brushing or washing, when performed                            (separate procedure)                           [phone], Unlisted procedure, pancreas Diagnosis Code(s):        --- Professional ---                            K20.90, Esophagitis, unspecified without bleeding                           Z97.8, Presence of other specified devices                           K31.89, Other diseases of stomach and duodenum                           K86.89, Other specified diseases of pancreas CPT copyright 2019 American Medical Association. All rights reserved. The codes documented in this report are preliminary and upon coder review may  be revised to meet current compliance requirements. Justice Britain, MD 06/12/2019 1:07:31 PM Number of Addenda: 0

## 2019-06-12 NOTE — TOC Progression Note (Signed)
Transition of Care [facility]) - Progression Note    Patient Details  Name: Caleb Chambers MRN: [id number] Date of Birth: [birth year]  Transition [facility] CM/SW Contact  Leone Haven, RN Phone Number: 06/12/2019, 3:54 PM  Clinical Narrative:    NCM spoke with patient to see who he wanted for [facility], he states he does not have a preference, NCM made referral to [facility] with Frances Furbish, he is able to take referral for [facility], and NCM made referral to Jeri Modena for TPN medication.  Patient also have orders for rolling walker and BSC, will need to have [employer] bring to room prior to dc.     Expected Discharge Plan: Home w Home Health Services Barriers to Discharge: Continued Medical Work up  Expected Discharge Plan and Services Expected Discharge Plan: Home w Home Health Services In-house Referral: NA Discharge Planning Services: CM Consult Post Acute Care Choice: Home Health, Durable Medical Equipment Living arrangements for the past 2 months: Single Family Home                 DME Arranged: Walker rolling, Bedside commode DME Agency: [employer] Date DME Agency Contacted: 06/09/19 Time DME Agency Contacted: 1300 Representative spoke with at DME Agency: Ian Malkin HH Arranged: RN HH Agency: [facility] Date [facility] Agency Contacted: 06/12/19 Time HH Agency Contacted: 1552 Representative spoke with at [facility] Agency: Kandee Keen   Social Determinants of Health (SDOH) Interventions    Readmission Risk Interventions Readmission Risk Prevention Plan 06/09/2019  Transportation Screening Complete  Medication Review Oceanographer) Complete  SW Recovery Care/Counseling Consult Complete  Palliative Care Screening Not Applicable  Skilled Nursing Facility Patient Refused  Some recent data might be hidden

## 2019-06-12 NOTE — Anesthesia Postprocedure Evaluation (Signed)
Anesthesia Post Note  Patient: Caleb Chambers  Procedure(s) Performed: ESOPHAGOGASTRODUODENOSCOPY (EGD) with NECROSECTOMY (N/A ) ENDOROTOR (N/A ) STENT REMOVAL PANCREATIC STENT PLACEMENT     Patient location during evaluation: Endoscopy Anesthesia Type: General Level of consciousness: awake and alert Pain management: pain level controlled Vital Signs Assessment: post-procedure vital signs reviewed and stable Respiratory status: spontaneous breathing, nonlabored ventilation, respiratory function stable and patient connected to nasal cannula oxygen Cardiovascular status: blood pressure returned to baseline and stable Postop Assessment: no apparent nausea or vomiting Anesthetic complications: no    Last Vitals:  Vitals:   06/12/19 1320 06/12/19 1346  BP: (!) 174/71 (!) 154/68  Pulse: (!) 101   Resp: (!) 24   Temp:  37.6 C  SpO2: 100%     Last Pain:  Vitals:   06/12/19 1346  TempSrc: Oral  PainSc: 0-No pain                 Trevor Iha

## 2019-06-12 NOTE — Progress Notes (Signed)
Calorie Count Note  Spoke with GI. Hopeful Monday will be patients last endoscopic procedure. Would really like to wean patient off of TPN prior to d/c vs going home on TPN. Will start calorie count today and through the weekend. RD to result on Monday.   Continue TPN to meet 100% of needs. May consider transition to home enteral feedings if PO intake remains poor and he does not require routine endoscopies.   RD placed calorie count envelope on the patient's door. Nursing to document percent consumed for each item on the patient's meal tray ticket and keep in envelope. Also to document percent of any supplement or snack pt consumes and keep documentation in envelope.   Vanessa Kick RD, LDN Clinical Nutrition Pager listed in [facility]

## 2019-06-12 NOTE — Transfer of Care (Signed)
Immediate Anesthesia Transfer of Care Note  Patient: Caleb Chambers  Procedure(s) Performed: ESOPHAGOGASTRODUODENOSCOPY (EGD) with NECROSECTOMY (N/A ) ENDOROTOR (N/A ) STENT REMOVAL PANCREATIC STENT PLACEMENT  Patient Location: Endoscopy Unit  Anesthesia Type:General  Level of Consciousness: drowsy  Airway & Oxygen Therapy: Patient Spontanous Breathing and Patient connected to face mask oxygen  Post-op Assessment: Report given to RN and Post -op Vital signs reviewed and stable  Post vital signs: Reviewed and stable  Last Vitals:  Vitals Value Taken Time  BP 172/65 06/12/19 1305  Temp    Pulse 103 06/12/19 1306  Resp 21 06/12/19 1306  SpO2 100 % 06/12/19 1306  Vitals shown include unvalidated device data.  Last Pain:  Vitals:   06/12/19 0945  TempSrc: Oral  PainSc:       Patients Stated Pain Goal: 2 (06/04/19 1200)  Complications: No apparent anesthesia complications

## 2019-06-13 LAB — BASIC METABOLIC PANEL
Anion gap: 7 (ref 5–15)
BUN: 8 mg/dL (ref 6–20)
CO2: 26 mmol/L (ref 22–32)
Calcium: 7.9 mg/dL — ABNORMAL LOW (ref 8.9–10.3)
Chloride: 100 mmol/L (ref 98–111)
Creatinine, Ser: 0.62 mg/dL (ref 0.61–1.24)
GFR calc Af Amer: >60 mL/min (ref >60)
GFR calc non Af Amer: >60 mL/min (ref >60)
Glucose, Bld: 290 mg/dL — ABNORMAL HIGH (ref 70–99)
Potassium: 3.7 mmol/L (ref 3.5–5.1)
Sodium: 133 mmol/L — ABNORMAL LOW (ref 135–145)

## 2019-06-13 LAB — GLUCOSE, CAPILLARY
Glucose-Capillary: 288 mg/dL — ABNORMAL HIGH (ref 70–99)
Glucose-Capillary: 277 mg/dL — ABNORMAL HIGH (ref 70–99)
Glucose-Capillary: 251 mg/dL — ABNORMAL HIGH (ref 70–99)
Glucose-Capillary: 204 mg/dL — ABNORMAL HIGH (ref 70–99)

## 2019-06-13 LAB — MAGNESIUM: Magnesium: 1.7 mg/dL (ref 1.7–2.4)

## 2019-06-13 MED ORDER — POTASSIUM CHLORIDE CRYS ER 20 MEQ PO TBCR
40.0000 meq | EXTENDED_RELEASE_TABLET | Freq: Once | ORAL | Status: AC
  Administered 2019-06-13: 40 meq via ORAL
  Filled 2019-06-13: qty 2

## 2019-06-13 MED ORDER — MAGNESIUM SULFATE 4 GM/100ML IV SOLN
4.0000 g | Freq: Once | INTRAVENOUS | Status: AC
  Administered 2019-06-13: 09:00:00 4 g via INTRAVENOUS
  Filled 2019-06-13: qty 100

## 2019-06-13 MED ORDER — STERILE WATER FOR INJECTION IV SOLN
INTRAVENOUS | Status: AC
  Filled 2019-06-13: qty 947.2

## 2019-06-13 NOTE — Progress Notes (Signed)
PHARMACY - TOTAL PARENTERAL NUTRITION CONSULT NOTE  Indication:  Infected pancreatic pseudocyst  Patient Measurements: Height: 5\' 11"  (180.3 cm) Weight: 297 lb 9.9 oz (135 kg) IBW/kg (Calculated) : 75.3 TPN AdjBW (KG): 91.7 Body mass index is 41.51 kg/m.  Assessment:  76 YOM presented on 05/12/19 with DKA.  Patient was recently discharged after treatment for DKA and acute pancreatitis.  CT showed worsening pancreatitis and the development of a fluid collection.  Patient was started on tube feeding on 1/28 and tolerated it well through 2/10.  A clear liquid diet was initiated on 05/22/19 but intake was minimal, and was off and on a clear diet until 2/15.  EGD on 2/11 showed esophagitis/duodenitis and patient underwent pancreatic and biliary stent placements on 2/12.  He is s/p necrosectomy for pancreatic pseudocyst/necrosis on 2/15 and again on 2/17.  Feeding tube placement delayed due to frequent procedures.  Patient was advanced to a cardiac diet on 2/15 and intake has been inadequate.  In setting of severe malutrition, Pharmacy consulted to manage TPN.  Glucose / Insulin: hx IDDM admitted with DKA - A1c 13.2%.  CBGs elevated (268-290) with 50 units of insulin in TPN, required 25 units rSSI (discontinued Lantus to prevent errors) *was on Lantus 45/20 BID and significant amount of SSI while on goal rate TF/oral diet  Electrolytes: Na 133 (up), K 3.7 (after replacement, on Bumex), Mag 1.7 (after replacement, on Bumex), Phos 3.3, CoCa 9.8 Renal: SCr 0.62 (stable), BUN WNL. LFTs / TGs: LFTs / tbili / TG within normal limits Prealbumin / albumin: Prealbumin 8.3, albumin low at 1.6 Intake / Output; MIVF: 1713/525 (+1188 mL), UOP down at 0.2 ml/kg/hr; LBM 2/25 x2 GI Imaging: none since TPN consult Surgeries / Procedures:  2/19 necrosectomy, no gross lesion in esophagus, erythematous mucosa 2/22 necrosectomy 2/26 repeat necrosectomy  Central access: PICC placed 06/05/19 TPN start date:  06/05/19  Nutritional Goals (per RD rec on 2/25): 2300-2500 kCal, 130-145g protein, >2L fluid per day  Current Nutrition:  Heart diet - ongoing calorie count this weekend Ensure Enlive - refusing Prostat - refusing TPN   Plan:   Continue concentrated TPN to goal rate of 80 ml/hr.  TPN provides 142g AA, 326g CHO and 72g ILE for a total of 2398 kCal, meeting ~100% of patient needs Electrolytes in 2399 increased Na; increase K (115 mEq/d), increase Mag and continue decreased Phos. Cl:Ac 1:1 for now Continue daily PO multivitamin (no multivitamin and trace elements in TPN)  Continue resistant SSI to Q4H + continue increased regular insulin in TPN bag to 65 units Discontinued Lantus -- will give all insulin in TPN as this is safer and will help prevent issues if TPN were held as well as prevent lows if/when transition to cyclic TPN.   Give Magnesium 4g IV x1 today Give Potassium chloride OAC:[id number] po x1   F/U AM labs, CBGs, ongoing calorie count results versus ability to place feeding tube, po intake and discharge nutrition plans If plan remains for home TPN - will plan to cycle TPN before discharge (not currently pending ongoing calorie count)  , PharmD, BCPS, BCCCP Clinical Pharmacist Clinical phone 06/13/2019 until 3PM [phone] Please refer to [facility] for [facility] numbers 06/13/2019, 7:24 AM

## 2019-06-13 NOTE — Progress Notes (Signed)
PROGRESS NOTE    Caleb Chambers  ZCH:[id number] DOB: Apr 05, 1966 DOA: 05/12/2019 PCP: Patient, No Pcp Per    Brief Narrative:  Patient was admitted to the hospital with a working diagnosis of diabetes ketoacidosis complicated by acute kidney injury.Hospitalization has been complicated by severe necrotizing pancreatitis with pancreatic pseudocyst staph aureus infection  54 year old male who presented with diabetes ketoacidosis. He does have significant past medical history for hypertension, morbid obesity, type 2 diabetes mellitus and gout. He had a recent hospitalization for pancreatitis and diabetes ketoacidosis 01/4 to 01/13.At home patient reported ongoing abdominal pain, and poor oral intake. Poor compliance with insulin. On his initial physical examination blood pressure was 96/71, heart rate 102, respiratory rate 23, temperature 98.6, oxygenation 99%.His lungs were clear to auscultation bilaterally, heart S1-S2 present rhythmic, soft abdomen, right upper quadrant tenderness, no rebound, no lower extremity edema. Sodium 123, potassium 3.6, chloride 87, bicarb 14, glucose 609, BUN 57, creatinine 2.96.SARS COVID-19 negative.EKG 98 bpm, left axis deviation, normal intervals, sinus rhythm, no ST segment or T wave changes.  CT of the abdomen with marked severity acute pancreatitis, associated with a very large pancreatic fluid collection.  Patient received supportive medical therapy, repeat CT abdomen showed persistent pancreatitis with new scattered peripancreatic gas, extensive ill-defined peripancreatic fluid extending to the transverse colon to the root of mesentery.   Gastroenterology was consulted and patient underwent upper endoscopic ultrasonography(05/28/19),aAXIOScystogastrostomy tube was created and 1.2 L of fluid were removed(double-pigtail replaced throughAXIOS).Removed pigtails on 06/01/19, through upper endoscopy. Follow up EGD on 02/17, 02/19. On 02/22  double pigtails were replaced. 02/26: EGD double pigtails stents removed and performed necrosectomy procedure.  Cyst culture positive for staph aureus, patient has been receiving TPN and antibiotic therapy with vancomycinand metronidazole.   Assessment & Plan:   Principal Problem:   Pancreatic necrosis Active Problems:   Acute pancreatitis   Acute kidney injury ([place])   Morbid obesity (HCC)   DKA (diabetic ketoacidosis) (HCC)   Debility   Protein-calorie malnutrition, severe   Infected pancreatic pseudocyst   1. Necrotizing pancreatitis, infected pancreatic pseudocyst (Staph aureus), complicated by DKA present on admission.Patient is sp 6 endoscopic procedures, the more recent on 02.26, documented double pig tails removed and necrosectomy procedure.    Tolerating well TPN and continue tolerating po well. Nausea controlled with antiemetics.    Continue with doxycycline and metronidazole. On analgesics, antiacids and as needed antiemetics. Continue with nutritional supplements.     2. Uncontrolled T2DM (Hgb O8N 86.7) complicated with DKA (resolved). Fasting glucose this am is290. Continue insulin per TPN plus and insulin sliding scale for glucose cover and monitoring. Continue to encourage po intake.   3. HTN.Onlisinopril, HZTC, amlodipine and bumetanide for blood pressure control.   4. Hypokalemia, hyponatremia and hypomagnesemia.Serum cr at 0,62, K at 3,7 and serum bicarbonate at 26, Na up to 133. Continue TPN per pharmacy protocol.   5. Calorie protein malnutrition, severe,Onnutritional supplements.  DVT prophylaxis:enoxaparin Code Status:full Family Communication:no family at the bedside Disposition Plan/ discharge barriers:pending finalize endoscopic procedures.    Nutrition Status: Nutrition Problem: Severe Malnutrition Etiology: acute illness(pancreatitis) Signs/Symptoms: energy intake < or equal to 50% for > or equal to 5 days, moderate fat  depletion, mild fat depletion, moderate muscle depletion, mild muscle depletion Interventions: Tube feeding     Consultants:  GI  Procedures:  02/11, 02/15, 02/17, 02/22 EGD/ EUS  02/26  Antimicrobials:  Doxycycline and metronidazole   Subjective: Patient continue to have abdominal discomfort and gas, tolerating po  well, positive nausea but not vomiting, no chest pain. Persistent lower extremity edema.   Objective: Vitals:   06/12/19 1602 06/12/19 1927 06/13/19 0500 06/13/19 0742  BP: (!) 116/43 140/75  (!) 142/90  Pulse: 67 (!) 103  97  Resp: 14 (!) 22    Temp: 98.9 F (37.2 C) 98.5 F (36.9 C)  98.3 F (36.8 C)  TempSrc: Oral Oral  Oral  SpO2: 100% 99%  96%  Weight:   135 kg   Height:        Intake/Output Summary (Last 24 hours) at 06/13/2019 0825 Last data filed at 06/13/2019 0700 Gross per 24 hour  Intake 1713.39 ml  Output 525 ml  Net 1188.39 ml   Filed Weights   06/11/19 0419 06/12/19 0628 06/13/19 0500  Weight: (!) 137.3 kg 134.9 kg 135 kg    Examination:   General: Not in pain or dyspnea, deconditioned  Neurology: Awake and alert, non focal  E ENT: no pallor, no icterus, oral mucosa moist Cardiovascular: No JVD. S1-S2 present, rhythmic, no gallops, rubs, or murmurs. +++ pitting lower extremity edema. Pulmonary: positive breath sounds bilaterally, adequate air movement, no wheezing, rhonchi or rales. Gastrointestinal. Abdomen protuberant with no organomegaly, non tender, no rebound or guarding Skin. No rashes Musculoskeletal: no joint deformities     Data Reviewed: I have personally reviewed following labs and imaging studies  CBC: Recent Labs  Lab 06/07/19 0412 06/08/19 0404 06/11/19 0950 06/12/19 0352  WBC 4.8 5.2 5.2 6.0  NEUTROABS 3.8 3.0 3.6 3.7  HGB 7.7* 7.9* 8.0* 7.8*  HCT 25.6* 25.9* 27.1* 25.5*  MCV 85.3 84.4 85.8 84.4  PLT 348 321 295 250   Basic Metabolic Panel: Recent Labs  Lab 06/07/19 0412 06/07/19 0412  06/08/19 0404 06/08/19 0404 06/09/19 0630 06/10/19 0413 06/11/19 0427 06/11/19 0950 06/12/19 0352 06/13/19 0415  NA 137   < > 138   < > 137 136  --  135 129* 133*  K 3.3*   < > 3.6   < > 3.9 3.8  --  3.9 3.7 3.7  CL 107   < > 106   < > 109 103  --  103 98 100  CO2 23   < > 24   < > 24 25  --  25 24 26   GLUCOSE 257*   < > 193*   < > 160* 182*  --  201* 254* 290*  BUN 5*   < > 6   < > 8 8  --  6 7 8   CREATININE 0.55*   < > 0.64   < > 0.64 0.56*  --  0.76 0.68 0.62  CALCIUM 7.7*   < > 7.8*   < > 7.9* 8.2*  --  8.1* 7.9* 7.9*  MG 1.6*   < > 1.7   < > 1.6* 1.6* 1.7  --  1.7 1.7  PHOS 2.9  --  3.1  --  3.3 3.7  --   --  3.3  --    < > = values in this interval not displayed.   GFR: Estimated Creatinine Clearance: 151.6 mL/min (by C-G formula based on SCr of 0.62 mg/dL). Liver Function Tests: Recent Labs  Lab 06/07/19 0412 06/08/19 0404 06/12/19 0352  AST 10* 9* 11*  ALT 10 9 8   ALKPHOS 55 52 61  BILITOT 0.3 0.2* 0.2*  PROT 6.1* 6.5 6.9  ALBUMIN 1.4* 1.5* 1.6*   No results for input(s): LIPASE, AMYLASE in the last  168 hours. No results for input(s): AMMONIA in the last 168 hours. Coagulation Profile: No results for input(s): INR, PROTIME in the last 168 hours. Cardiac Enzymes: No results for input(s): CKTOTAL, CKMB, CKMBINDEX, TROPONINI in the last 168 hours. BNP (last 3 results) No results for input(s): PROBNP in the last 8760 hours. HbA1C: No results for input(s): HGBA1C in the last 72 hours. CBG: Recent Labs  Lab 06/12/19 1040 06/12/19 1505 06/12/19 1928 06/12/19 2326 06/13/19 0611  GLUCAP 275* 264* 289* 268* 288*   Lipid Profile: Recent Labs    06/12/19 0352  TRIG 102   Thyroid Function Tests: No results for input(s): TSH, T4TOTAL, FREET4, T3FREE, THYROIDAB in the last 72 hours. Anemia Panel: No results for input(s): VITAMINB12, FOLATE, FERRITIN, TIBC, IRON, RETICCTPCT in the last 72 hours.    Radiology Studies: I have reviewed all of the imaging  during this hospital visit personally     Scheduled Meds: . sodium chloride   Intravenous Once  . allopurinol  300 mg Oral Daily  . amLODipine  10 mg Oral Daily  . bumetanide  0.5 mg Oral Daily  . chlorhexidine  15 mL Mouth Rinse BID  . Chlorhexidine Gluconate Cloth  6 each Topical Daily  . diphenoxylate-atropine  2 tablet Oral BID  . doxycycline  100 mg Oral Q12H  . enoxaparin (LOVENOX) injection  40 mg Subcutaneous Q24H  . feeding supplement (ENSURE ENLIVE)  237 mL Oral TID BM  . feeding supplement (PRO-STAT SUGAR FREE 64)  30 mL Oral TID  . hydrochlorothiazide  12.5 mg Oral Daily  . insulin aspart  0-20 Units Subcutaneous TID WC  . insulin aspart  0-5 Units Subcutaneous QHS  . lipase/protease/amylase  36,000 Units Oral TID AC  . lisinopril  20 mg Oral Daily  . metroNIDAZOLE  500 mg Oral Q8H  . morphine  30 mg Oral Q12H  . multivitamin with minerals  1 tablet Oral Daily  . pantoprazole  40 mg Oral Daily   Continuous Infusions: . magnesium sulfate bolus IVPB    . TPN ADULT (ION) 80 mL/hr at 06/12/19 1712  . TPN ADULT (ION)       LOS: 32 days        Travelle Mcclimans Annett Gula, MD

## 2019-06-13 NOTE — Plan of Care (Signed)
  Problem: Education: Goal: Knowledge of General Education information will improve Description: Including pain rating scale, medication(s)/side effects and non-pharmacologic comfort measures Outcome: Progressing   Problem: Health Behavior/Discharge Planning: Goal: Ability to manage health-related needs will improve Outcome: Progressing   Problem: Nutrition: Goal: Adequate nutrition will be maintained Outcome: Progressing   Problem: Coping: Goal: Level of anxiety will decrease Outcome: Progressing   Problem: Elimination: Goal: Will not experience complications related to bowel motility Outcome: Progressing   Problem: Pain Managment: Goal: General experience of comfort will improve Outcome: Progressing   Problem: Safety: Goal: Ability to remain free from injury will improve Outcome: Progressing   Problem: Skin Integrity: Goal: Risk for impaired skin integrity will decrease Outcome: Progressing   

## 2019-06-14 LAB — GLUCOSE, CAPILLARY
Glucose-Capillary: 207 mg/dL — ABNORMAL HIGH (ref 70–99)
Glucose-Capillary: 237 mg/dL — ABNORMAL HIGH (ref 70–99)
Glucose-Capillary: 231 mg/dL — ABNORMAL HIGH (ref 70–99)
Glucose-Capillary: 169 mg/dL — ABNORMAL HIGH (ref 70–99)

## 2019-06-14 MED ORDER — SODIUM CHLORIDE 0.9 % IV SOLN: INTRAVENOUS | Status: DC

## 2019-06-14 MED ORDER — STERILE WATER FOR INJECTION IV SOLN
INTRAVENOUS | Status: AC
  Filled 2019-06-14: qty 947.2

## 2019-06-14 MED ORDER — OXYCODONE HCL 5 MG PO TABS
5.0000 mg | ORAL_TABLET | ORAL | Status: DC | PRN
  Administered 2019-06-14 – 2019-06-19 (×5): 5 mg via ORAL
  Filled 2019-06-14 (×5): qty 1

## 2019-06-14 MED ORDER — HYDROMORPHONE HCL 1 MG/ML IJ SOLN
0.5000 mg | INTRAMUSCULAR | Status: DC | PRN
  Administered 2019-06-15 – 2019-06-17 (×2): 0.5 mg via INTRAVENOUS
  Filled 2019-06-14 (×2): qty 0.5

## 2019-06-14 NOTE — Progress Notes (Signed)
PHARMACY - TOTAL PARENTERAL NUTRITION CONSULT NOTE  Indication:  Infected pancreatic pseudocyst  Patient Measurements: Height: 5\' 11"  (180.3 cm) Weight: 297 lb 13.5 oz (135.1 kg)(Scale A) IBW/kg (Calculated) : 75.3 TPN AdjBW (KG): 91.7 Body mass index is 41.54 kg/m.  Assessment:  27 YOM presented on 05/12/19 with DKA.  Patient was recently discharged after treatment for DKA and acute pancreatitis.  CT showed worsening pancreatitis and the development of a fluid collection.  Patient was started on tube feeding on 1/28 and tolerated it well through 2/10.  A clear liquid diet was initiated on 05/22/19 but intake was minimal, and was off and on a clear diet until 2/15.  EGD on 2/11 showed esophagitis/duodenitis and patient underwent pancreatic and biliary stent placements on 2/12.  He is s/p necrosectomy for pancreatic pseudocyst/necrosis on 2/15 and again on 2/17.  Feeding tube placement delayed due to frequent procedures.  Patient was advanced to a cardiac diet on 2/15 and intake has been inadequate.  In setting of severe malutrition, Pharmacy consulted to manage TPN.  Glucose / Insulin: hx IDDM admitted with DKA - A1c 13.2%.  CBGs remain elevated but starting to trend down at lower 200s with 65 units of insulin in TPN, required 24 units rSSI/24hrs (discontinued Lantus to prevent errors for now but if transition off TPN, would change back to Lantus) *was on Lantus 45/20 BID and significant amount of SSI while on goal rate TF/oral diet  Electrolytes: Labs 2/27: Na 133 (up), K 3.7 (after replacement, on Bumex), Mag 1.7 (after replacement, on Bumex), Phos 3.3, CoCa 9.8 Renal: SCr 0.62 (stable), BUN WNL. LFTs / TGs: LFTs / tbili / TG within normal limits Prealbumin / albumin: Prealbumin 8.3, albumin low at 1.6 Intake / Output; MIVF: 1713/525 (+1188 mL), UOP down at 0.2 ml/kg/hr; LBM 2/25 x2 GI Imaging: none since TPN consult Surgeries / Procedures:  2/19 necrosectomy, no gross lesion in esophagus,  erythematous mucosa 2/22 necrosectomy 2/26 repeat necrosectomy  Central access: PICC placed 06/05/19 TPN start date: 06/05/19  Nutritional Goals (per RD rec on 2/25): 2300-2500 kCal, 130-145g protein, >2L fluid per day  Current Nutrition:  Heart diet - ongoing calorie count this weekend (25% recorded on 2/27) Ensure Enlive - refusing Prostat - refusing TPN   Plan:   Continue concentrated TPN to goal rate of 80 ml/hr.  TPN provides 142g AA, 326g CHO and 72g ILE for a total of 2398 kCal, meeting ~100% of patient needs Electrolytes in 2399 increased Na; increase K (115 mEq/d), increase Mag and continue decreased Phos. Cl:Ac 1:1 for now Continue daily PO multivitamin (no multivitamin and trace elements in TPN)  Continue resistant SSI to Q4H +  increase regular insulin in TPN bag to 75 units (39 units/L) Discontinued Lantus -- will give all insulin in TPN as this is safer and will help prevent issues if TPN were held as well as prevent lows if/when transition to cyclic TPN. However, if transition back to enteral diet, would resume Lantus and transition.   F/U AM labs, CBGs, ongoing calorie count results versus ability to place feeding tube, po intake and discharge nutrition plans If plan remains for home TPN - will plan to cycle TPN before discharge (not currently pending ongoing calorie count).  WJX:[id number], PharmD, BCPS, BCCCP Clinical Pharmacist Clinical phone 06/14/2019 until 3PM [phone] Please refer to [facility] for [facility] numbers 06/14/2019, 8:00 AM

## 2019-06-14 NOTE — Plan of Care (Signed)

## 2019-06-14 NOTE — Progress Notes (Signed)
PROGRESS NOTE    COPELAND NEISEN  DDU:[id number] DOB: [birth year] DOA: 05/12/2019 PCP: Patient, No Pcp Per    Brief Narrative:  Patient was admitted to the hospital with a working diagnosis of diabetes ketoacidosis complicated by acute kidney injury.Hospitalization has been complicated by severe necrotizing pancreatitis with pancreatic pseudocyst staph aureus infection  54 year old male who presented with diabetes ketoacidosis. He does have significant past medical history for hypertension, morbid obesity, type 2 diabetes mellitus and gout. He had a recent hospitalization for pancreatitis and diabetes ketoacidosis 01/4 to 01/13.At home patient reported ongoing abdominal pain, and poor oral intake. Poor compliance with insulin. On his initial physical examination blood pressure was 96/71, heart rate 102, respiratory rate 23, temperature 98.6, oxygenation 99%.His lungs were clear to auscultation bilaterally, heart S1-S2 present rhythmic, soft abdomen, right upper quadrant tenderness, no rebound, no lower extremity edema. Sodium 123, potassium 3.6, chloride 87, bicarb 14, glucose 609, BUN 57, creatinine 2.96.SARS COVID-19 negative.EKG 98 bpm, left axis deviation, normal intervals, sinus rhythm, no ST segment or T wave changes.  CT of the abdomen with marked severity acute pancreatitis, associated with a very large pancreatic fluid collection.  Patient received supportive medical therapy, repeat CT abdomen showed persistent pancreatitis with new scattered peripancreatic gas, extensive ill-defined peripancreatic fluid extending to the transverse colon to the root of mesentery.   Gastroenterology was consulted and patient underwent upper endoscopic ultrasonography(05/28/19),aAXIOScystogastrostomy tube was created and 1.2 L of fluid were removed(double-pigtail replaced throughAXIOS).Removed pigtails on 06/01/19, through upper endoscopy. Follow up EGD on 02/17, 02/19. On 02/22  double pigtails were replaced. 02/26: EGD double pigtails stents removed and performed necrosectomy procedure.  Cyst culture positive for staph aureus, patient has been receiving TPN and antibiotic therapy with vancomycinand metronidazole.    Assessment & Plan:   Principal Problem:   Pancreatic necrosis Active Problems:   Acute pancreatitis   Acute kidney injury (HCC)   Morbid obesity (HCC)   DKA (diabetic ketoacidosis) (HCC)   Debility   Protein-calorie malnutrition, severe   Infected pancreatic pseudocyst   1. Necrotizing pancreatitis, infected pancreatic pseudocyst (Staph aureus), complicated by DKA present on admission.Patient is sp 6 endoscopic procedures, the more recent on 02.26, documented double pig tails removed and necrosectomy procedure.   Continue with TPN, poor oral intake. Continue antiemetics and will add oxycodone for pain control. Continue with antiacids and nutritional supplements.   Antibiotic therapy with doxycycline and metronidazole, per GI recommendations.    2. Uncontrolled T2DM (Hgb A1c 13.2) complicated with DKA (resolved). Fasting glucose this am is207.On insulin per TPN protocol and insulin sliding scale. Patient continue to have poor oral intake. .   3. HTN.Blood pressure control with lisinopril, HZTC, and amlodipine. Continue bumetanide for diuresis per home regimen.   4. Hypokalemia, hyponatremia and hypomagnesemia. Check renal panel and electrolytes in am. Continue correction per TPN protocol.   5. Calorie protein malnutrition, severe,Continue withnutritional supplements.  DVT prophylaxis:enoxaparin Code Status:full Family Communication:no family at the bedside Disposition Plan/ discharge barriers:pending finalize endoscopic procedures.   Nutrition Status: Nutrition Problem: Severe Malnutrition Etiology: acute illness(pancreatitis) Signs/Symptoms: energy intake < or equal to 50% for > or equal to 5 days, moderate  fat depletion, mild fat depletion, moderate muscle depletion, mild muscle depletion Interventions: Tube feeding    Consultants:  GI  Procedures:  02/11, 02/15, 02/17, 02/22 EGD/ EUS  02/26  Antimicrobials:  Doxycycline and metronidazole   Subjective: This am patient is out of bed to chair, intermittent nausea and very poor oral intake,  continue to have gas distention and dull persistent abdominal pain, mild improvement with analgesics.    Objective: Vitals:   06/13/19 2316 06/14/19 0405 06/14/19 0412 06/14/19 0712  BP: 139/77  (!) 159/86 (!) 161/89  Pulse: 98  100 99  Resp: 18  (!) 22 20  Temp: 98.6 F (37 C)  97.8 F (36.6 C) 98.2 F (36.8 C)  TempSrc: Oral  Oral Oral  SpO2: 100%  98% 99%  Weight:  135.1 kg    Height:        Intake/Output Summary (Last 24 hours) at 06/14/2019 0857 Last data filed at 06/14/2019 0800 Gross per 24 hour  Intake 1946.2 ml  Output 1250 ml  Net 696.2 ml   Filed Weights   06/12/19 0628 06/13/19 0500 06/14/19 0405  Weight: 134.9 kg 135 kg 135.1 kg    Examination:   General: Not in pain or dyspnea, deconditioned  Neurology: Awake and alert, non focal  E ENT: mild pallor, no icterus, oral mucosa moist Cardiovascular: No JVD. S1-S2 present, rhythmic, no gallops, rubs, or murmurs. ++/.+++ pitting lower extremity edema. Pulmonary: positive breath sounds bilaterally, adequate air movement, no wheezing, rhonchi or rales. Gastrointestinal. Abdomen distended with no organomegaly, non tender, no rebound or guarding Skin. No rashes Musculoskeletal: no joint deformities     Data Reviewed: I have personally reviewed following labs and imaging studies  CBC: Recent Labs  Lab 06/08/19 0404 06/11/19 0950 06/12/19 0352  WBC 5.2 5.2 6.0  NEUTROABS 3.0 3.6 3.7  HGB 7.9* 8.0* 7.8*  HCT 25.9* 27.1* 25.5*  MCV 84.4 85.8 84.4  PLT 321 295 998   Basic Metabolic Panel: Recent Labs  Lab 06/08/19 0404 06/08/19 0404 06/09/19 0630  06/10/19 0413 06/11/19 0427 06/11/19 0950 06/12/19 0352 06/13/19 0415  NA 138   < > 137 136  --  135 129* 133*  K 3.6   < > 3.9 3.8  --  3.9 3.7 3.7  CL 106   < > 109 103  --  103 98 100  CO2 24   < > 24 25  --  25 24 26   GLUCOSE 193*   < > 160* 182*  --  201* 254* 290*  BUN 6   < > 8 8  --  6 7 8   CREATININE 0.64   < > 0.64 0.56*  --  0.76 0.68 0.62  CALCIUM 7.8*   < > 7.9* 8.2*  --  8.1* 7.9* 7.9*  MG 1.7   < > 1.6* 1.6* 1.7  --  1.7 1.7  PHOS 3.1  --  3.3 3.7  --   --  3.3  --    < > = values in this interval not displayed.   GFR: Estimated Creatinine Clearance: 151.6 mL/min (by C-G formula based on SCr of 0.62 mg/dL). Liver Function Tests: Recent Labs  Lab 06/08/19 0404 06/12/19 0352  AST 9* 11*  ALT 9 8  ALKPHOS 52 61  BILITOT 0.2* 0.2*  PROT 6.5 6.9  ALBUMIN 1.5* 1.6*   No results for input(s): LIPASE, AMYLASE in the last 168 hours. No results for input(s): AMMONIA in the last 168 hours. Coagulation Profile: No results for input(s): INR, PROTIME in the last 168 hours. Cardiac Enzymes: No results for input(s): CKTOTAL, CKMB, CKMBINDEX, TROPONINI in the last 168 hours. BNP (last 3 results) No results for input(s): PROBNP in the last 8760 hours. HbA1C: No results for input(s): HGBA1C in the last 72 hours. CBG: Recent Labs  Lab 06/13/19 0611 06/13/19 1132 06/13/19 1621 06/13/19 2106 06/14/19 0621  GLUCAP 288* 277* 251* 204* 207*   Lipid Profile: Recent Labs    06/12/19 0352  TRIG 102   Thyroid Function Tests: No results for input(s): TSH, T4TOTAL, FREET4, T3FREE, THYROIDAB in the last 72 hours. Anemia Panel: No results for input(s): VITAMINB12, FOLATE, FERRITIN, TIBC, IRON, RETICCTPCT in the last 72 hours.    Radiology Studies: I have reviewed all of the imaging during this hospital visit personally     Scheduled Meds: . sodium chloride   Intravenous Once  . allopurinol  300 mg Oral Daily  . amLODipine  10 mg Oral Daily  . bumetanide  0.5  mg Oral Daily  . chlorhexidine  15 mL Mouth Rinse BID  . Chlorhexidine Gluconate Cloth  6 each Topical Daily  . diphenoxylate-atropine  2 tablet Oral BID  . doxycycline  100 mg Oral Q12H  . enoxaparin (LOVENOX) injection  40 mg Subcutaneous Q24H  . feeding supplement (ENSURE ENLIVE)  237 mL Oral TID BM  . feeding supplement (PRO-STAT SUGAR FREE 64)  30 mL Oral TID  . hydrochlorothiazide  12.5 mg Oral Daily  . insulin aspart  0-20 Units Subcutaneous TID WC  . insulin aspart  0-5 Units Subcutaneous QHS  . lipase/protease/amylase  36,000 Units Oral TID AC  . lisinopril  20 mg Oral Daily  . metroNIDAZOLE  500 mg Oral Q8H  . morphine  30 mg Oral Q12H  . multivitamin with minerals  1 tablet Oral Daily  . pantoprazole  40 mg Oral Daily   Continuous Infusions: . TPN ADULT (ION) 80 mL/hr at 06/14/19 0400  . TPN ADULT (ION)       LOS: 33 days        Leno Mathes Annett Gula, MD

## 2019-06-14 NOTE — H&P (View-Only) (Signed)
[facility] ROUNDING NOTE   Subjective: No acute events overnight.  Tolerating full meals.  Plan for repeat procedure with Dr. Mansouraty tomorrow.   Objective: Vital signs in last 24 hours: Temp:  [97.8 F (36.6 C)-98.8 F (37.1 C)] 98 F (36.7 C) (02/28 1223) Pulse Rate:  [94-100] 99 (02/28 0712) Resp:  [18-22] 19 (02/28 1223) BP: (115-161)/(74-89) 136/74 (02/28 1223) SpO2:  [98 %-100 %] 99 % (02/28 0712) Weight:  [135.1 kg] 135.1 kg (02/28 0405) Last BM Date: 06/13/19 General: NAD Abdomen:  Soft, NT, ND, +BS    Intake/Output from previous day: 02/27 0701 - 02/28 0700 In: 2026.2 [P.O.:460; I.V.:1566.2] Out: 1125 [Urine:1125] Intake/Output this shift: Total I/O In: -  Out: 125 [Urine:125]   Lab Results: Recent Labs    06/12/19 0352  WBC 6.0  HGB 7.8*  PLT 250  MCV 84.4   BMET Recent Labs    06/12/19 0352 06/13/19 0415  NA 129* 133*  K 3.7 3.7  CL 98 100  CO2 24 26  GLUCOSE 254* 290*  BUN 7 8  CREATININE 0.68 0.62  CALCIUM 7.9* 7.9*   LFT Recent Labs    06/12/19 0352  PROT 6.9  ALBUMIN 1.6*  AST 11*  ALT 8  ALKPHOS 61  BILITOT 0.2*   PT/INR No results for input(s): INR in the last 72 hours.    Imaging/Other results: No results found.    Assessment and Plan:  1) Pancreatic pseudocyst/Walled off Necrosis  2) Abdominal pain-resolved 3) Severe malnutrition 4) LA Grade A esophagitis  Repeat necrosectomy performed on 06/12/2019 via pre-existing Axios stent.  Plan for repeat necrosectomy tomorrow with Dr. Mansouraty at 1500.  -N.p.o. at midnight -Resume antimicrobial therapy   Graysyn Bache V Lain Tetterton, DO  06/14/2019, 1:50 PM [facility] Pager [phone]  

## 2019-06-14 NOTE — Progress Notes (Signed)
[facility] ROUNDING NOTE   Subjective: No acute events overnight.  Tolerating full meals.  Plan for repeat procedure with Dr. Meridee Score tomorrow.   Objective: Vital signs in last 24 hours: Temp:  [97.8 F (36.6 C)-98.8 F (37.1 C)] 98 F (36.7 C) (02/28 1223) Pulse Rate:  [94-100] 99 (02/28 0712) Resp:  [18-22] 19 (02/28 1223) BP: (115-161)/(74-89) 136/74 (02/28 1223) SpO2:  [98 %-100 %] 99 % (02/28 0712) Weight:  [135.1 kg] 135.1 kg (02/28 0405) Last BM Date: 06/13/19 General: NAD Abdomen:  Soft, NT, ND, +BS    Intake/Output from previous day: 02/27 0701 - 02/28 0700 In: 2026.2 [P.O.:460; I.V.:1566.2] Out: 1125 [Urine:1125] Intake/Output this shift: Total I/O In: -  Out: 125 [Urine:125]   Lab Results: Recent Labs    06/12/19 0352  WBC 6.0  HGB 7.8*  PLT 250  MCV 84.4   BMET Recent Labs    06/12/19 0352 06/13/19 0415  NA 129* 133*  K 3.7 3.7  CL 98 100  CO2 24 26  GLUCOSE 254* 290*  BUN 7 8  CREATININE 0.68 0.62  CALCIUM 7.9* 7.9*   LFT Recent Labs    06/12/19 0352  PROT 6.9  ALBUMIN 1.6*  AST 11*  ALT 8  ALKPHOS 61  BILITOT 0.2*   PT/INR No results for input(s): INR in the last 72 hours.    Imaging/Other results: No results found.    Assessment and Plan:  1) Pancreatic pseudocyst/Walled off Necrosis  2) Abdominal pain-resolved 3) Severe malnutrition 4) LA Grade A esophagitis  Repeat necrosectomy performed on 06/12/2019 via pre-existing Axios stent.  Plan for repeat necrosectomy tomorrow with Dr. Meridee Score at 1500.  -N.p.o. at midnight -Resume antimicrobial therapy   Shellia Cleverly, DO  06/14/2019, 1:50 PM [facility] Pager [phone]

## 2019-06-15 ENCOUNTER — Inpatient Hospital Stay (HOSPITAL_COMMUNITY): Payer: BC Managed Care – PPO | Admitting: Certified Registered Nurse Anesthetist

## 2019-06-15 ENCOUNTER — Encounter (HOSPITAL_COMMUNITY)
Admission: EM | Disposition: A | Discharge status: Home Health | Payer: Self-pay | Source: Home / Self Care | Attending: Internal Medicine

## 2019-06-15 ENCOUNTER — Encounter (HOSPITAL_COMMUNITY): Payer: Self-pay | Admitting: Internal Medicine

## 2019-06-15 HISTORY — PX: STENT REMOVAL: SHX6421

## 2019-06-15 HISTORY — PX: PANCREATIC STENT PLACEMENT: SHX5539

## 2019-06-15 HISTORY — PX: ENDOROTOR: SHX6859

## 2019-06-15 HISTORY — PX: ESOPHAGOGASTRODUODENOSCOPY: SHX5428

## 2019-06-15 LAB — CBC
HCT: 24.8 % — ABNORMAL LOW (ref 39.0–52.0)
Hemoglobin: 7.5 g/dL — ABNORMAL LOW (ref 13.0–17.0)
MCH: 25.5 pg — ABNORMAL LOW (ref 26.0–34.0)
MCHC: 30.2 g/dL (ref 30.0–36.0)
MCV: 84.4 fL (ref 80.0–100.0)
Platelets: 217 10*3/uL (ref 150–400)
RBC: 2.94 MIL/uL — ABNORMAL LOW (ref 4.22–5.81)
RDW: 18.9 % — ABNORMAL HIGH (ref 11.5–15.5)
WBC: 5.3 10*3/uL (ref 4.0–10.5)
nRBC: 0 % (ref 0.0–0.2)

## 2019-06-15 LAB — GLUCOSE, CAPILLARY
Glucose-Capillary: 154 mg/dL — ABNORMAL HIGH (ref 70–99)
Glucose-Capillary: 164 mg/dL — ABNORMAL HIGH (ref 70–99)
Glucose-Capillary: 175 mg/dL — ABNORMAL HIGH (ref 70–99)
Glucose-Capillary: 305 mg/dL — ABNORMAL HIGH (ref 70–99)

## 2019-06-15 LAB — TRIGLYCERIDES: Triglycerides: 95 mg/dL (ref <150)

## 2019-06-15 LAB — DIFFERENTIAL
Abs Immature Granulocytes: 0.02 10*3/uL (ref 0.00–0.07)
Basophils Absolute: 0 10*3/uL (ref 0.0–0.1)
Basophils Relative: 0 %
Eosinophils Absolute: 0 10*3/uL (ref 0.0–0.5)
Eosinophils Relative: 1 %
Immature Granulocytes: 0 %
Lymphocytes Relative: 28 %
Lymphs Abs: 1.5 10*3/uL (ref 0.7–4.0)
Monocytes Absolute: 0.7 10*3/uL (ref 0.1–1.0)
Monocytes Relative: 12 %
Neutro Abs: 3.1 10*3/uL (ref 1.7–7.7)
Neutrophils Relative %: 59 %

## 2019-06-15 LAB — COMPREHENSIVE METABOLIC PANEL
ALT: 8 U/L (ref 0–44)
AST: 9 U/L — ABNORMAL LOW (ref 15–41)
Albumin: 1.6 g/dL — ABNORMAL LOW (ref 3.5–5.0)
Alkaline Phosphatase: 57 U/L (ref 38–126)
Anion gap: 7 (ref 5–15)
BUN: 10 mg/dL (ref 6–20)
CO2: 25 mmol/L (ref 22–32)
Calcium: 8.1 mg/dL — ABNORMAL LOW (ref 8.9–10.3)
Chloride: 99 mmol/L (ref 98–111)
Creatinine, Ser: 0.53 mg/dL — ABNORMAL LOW (ref 0.61–1.24)
GFR calc Af Amer: >60 mL/min (ref >60)
GFR calc non Af Amer: >60 mL/min (ref >60)
Glucose, Bld: 164 mg/dL — ABNORMAL HIGH (ref 70–99)
Potassium: 3.9 mmol/L (ref 3.5–5.1)
Sodium: 131 mmol/L — ABNORMAL LOW (ref 135–145)
Total Bilirubin: 0.3 mg/dL (ref 0.3–1.2)
Total Protein: 7.1 g/dL (ref 6.5–8.1)

## 2019-06-15 LAB — MAGNESIUM: Magnesium: 1.6 mg/dL — ABNORMAL LOW (ref 1.7–2.4)

## 2019-06-15 LAB — PHOSPHORUS: Phosphorus: 3.1 mg/dL (ref 2.5–4.6)

## 2019-06-15 LAB — PREALBUMIN: Prealbumin: 7.5 mg/dL — ABNORMAL LOW (ref 18–38)

## 2019-06-15 SURGERY — EGD (ESOPHAGOGASTRODUODENOSCOPY)
Anesthesia: General

## 2019-06-15 MED ORDER — LACTATED RINGERS IV SOLN: INTRAVENOUS | Status: DC | PRN

## 2019-06-15 MED ORDER — LACTATED RINGERS IV SOLN: INTRAVENOUS | Status: DC

## 2019-06-15 MED ORDER — FENTANYL CITRATE (PF) 100 MCG/2ML IJ SOLN: 25.0000 ug | INTRAMUSCULAR | Status: DC | PRN

## 2019-06-15 MED ORDER — SUGAMMADEX SODIUM 200 MG/2ML IV SOLN
INTRAVENOUS | Status: DC | PRN
  Administered 2019-06-15: 300 mg via INTRAVENOUS

## 2019-06-15 MED ORDER — LIDOCAINE 2% (20 MG/ML) 5 ML SYRINGE
INTRAMUSCULAR | Status: DC | PRN
  Administered 2019-06-15: 100 mg via INTRAVENOUS

## 2019-06-15 MED ORDER — PHENYLEPHRINE HCL-NACL 10-0.9 MG/250ML-% IV SOLN
INTRAVENOUS | Status: DC | PRN
  Administered 2019-06-15: 25 ug/min via INTRAVENOUS

## 2019-06-15 MED ORDER — MAGNESIUM SULFATE 2 GM/50ML IV SOLN
2.0000 g | Freq: Once | INTRAVENOUS | Status: AC
  Administered 2019-06-15: 2 g via INTRAVENOUS
  Filled 2019-06-15: qty 50

## 2019-06-15 MED ORDER — SUGAMMADEX SODIUM 200 MG/2ML IV SOLN: INTRAVENOUS | Status: DC | PRN

## 2019-06-15 MED ORDER — CIPROFLOXACIN IN D5W 400 MG/200ML IV SOLN
INTRAVENOUS | Status: AC
  Filled 2019-06-15: qty 200

## 2019-06-15 MED ORDER — STERILE WATER FOR INJECTION IV SOLN
INTRAVENOUS | Status: AC
  Filled 2019-06-15: qty 947.2

## 2019-06-15 MED ORDER — FENTANYL CITRATE (PF) 250 MCG/5ML IJ SOLN
INTRAMUSCULAR | Status: DC | PRN
  Administered 2019-06-15 (×2): 50 ug via INTRAVENOUS

## 2019-06-15 MED ORDER — ONDANSETRON HCL 4 MG/2ML IJ SOLN
INTRAMUSCULAR | Status: DC | PRN
  Administered 2019-06-15: 4 mg via INTRAVENOUS

## 2019-06-15 MED ORDER — CIPROFLOXACIN IN D5W 400 MG/200ML IV SOLN
INTRAVENOUS | Status: DC | PRN
  Administered 2019-06-15: 400 mg via INTRAVENOUS

## 2019-06-15 MED ORDER — ROCURONIUM BROMIDE 10 MG/ML (PF) SYRINGE
PREFILLED_SYRINGE | INTRAVENOUS | Status: DC | PRN
  Administered 2019-06-15: 40 mg via INTRAVENOUS
  Administered 2019-06-15: 10 mg via INTRAVENOUS
  Administered 2019-06-15: 20 mg via INTRAVENOUS

## 2019-06-15 MED ORDER — ONDANSETRON HCL 4 MG/2ML IJ SOLN: 4.0000 mg | Freq: Once | INTRAMUSCULAR | Status: DC | PRN

## 2019-06-15 MED ORDER — OXYCODONE HCL 5 MG/5ML PO SOLN: 5.0000 mg | Freq: Once | ORAL | Status: DC | PRN

## 2019-06-15 MED ORDER — SUCCINYLCHOLINE CHLORIDE 200 MG/10ML IV SOSY
PREFILLED_SYRINGE | INTRAVENOUS | Status: DC | PRN
  Administered 2019-06-15: 180 mg via INTRAVENOUS

## 2019-06-15 MED ORDER — DEXAMETHASONE SODIUM PHOSPHATE 10 MG/ML IJ SOLN
INTRAMUSCULAR | Status: DC | PRN
  Administered 2019-06-15: 10 mg via INTRAVENOUS

## 2019-06-15 MED ORDER — OXYCODONE HCL 5 MG PO TABS: 5.0000 mg | ORAL_TABLET | Freq: Once | ORAL | Status: DC | PRN

## 2019-06-15 MED ORDER — PROPOFOL 10 MG/ML IV BOLUS
INTRAVENOUS | Status: DC | PRN
  Administered 2019-06-15: 100 mg via INTRAVENOUS

## 2019-06-15 NOTE — Transfer of Care (Signed)
Immediate Anesthesia Transfer of Care Note  Patient: Caleb Chambers  Procedure(s) Performed: ESOPHAGOGASTRODUODENOSCOPY (EGD) with NECROSECTOMY (N/A ) ENDOROTOR (N/A ) STENT REMOVAL PANCREATIC STENT PLACEMENT  Patient Location: PACU  Anesthesia Type:General  Level of Consciousness: awake, drowsy and patient cooperative  Airway & Oxygen Therapy: Patient Spontanous Breathing and Patient connected to nasal cannula oxygen  Post-op Assessment: Report given to RN and Post -op Vital signs reviewed and stable  Post vital signs: Reviewed and stable  Last Vitals:  Vitals Value Taken Time  BP 126/79 06/15/19 1719  Temp 36.3 C 06/15/19 1719  Pulse 97 06/15/19 1721  Resp 22 06/15/19 1721  SpO2 100 % 06/15/19 1721  Vitals shown include unvalidated device data.  Last Pain:  Vitals:   06/15/19 1355  TempSrc: Axillary  PainSc: 0-No pain      Patients Stated Pain Goal: 3 (06/14/19 0705)  Complications: No apparent anesthesia complications

## 2019-06-15 NOTE — Progress Notes (Addendum)
Calorie Count Note  48 hour calorie count ordered.  Diet: Heart Healthy  Supplements: Ensure Enlive TID, Magic Cup TID  Day 1- 2/27 Breakfast: 70 kcal, 3 g protein  Lunch: 174 kcal, 9 g protein  Dinner: refused  Supplements: refused   Total intake: 244 kcal (11% of minimum estimated needs)  12 protein (9% of minimum estimated needs)  Day 2- 2/28 Breakfast: 160 kcal, 5 g protein  Lunch: 282 kcal, 16 g protein  Dinner: refused Supplements: refused   Total intake: 442 kcal (19% of minimum estimated needs)  21 protein (16% of minimum estimated needs)  Estimated Nutritional Needs:  Kcal:  2300-2500 Protein:  130-145 grams Fluid:  >2L/d  Nutrition Dx: Severe Malnutrition related to acute illness(pancreatitis) as evidenced by energy intake < or equal to 50% for > or equal to 5 days, moderate fat depletion, mild fat depletion, moderate muscle depletion, mild muscle depletion.- being addressed via TPN  Goal: Patient will meet greater than or equal to 90% of their needs-not meeting PO  Intervention:   TPN to meet 100% of needs. Intake slow to progress. Pt reports appetite is getting better but remains inadequate. Pain has now resolved. Complains of indigestion after eating.   Given ongoing poor PO intake, consider short term home TPN. Ideal form of nutrition support would be enteral but pt unlikely to need long term feeding tube as intake is hopefully going to progress. Spoke with [facility] regarding home TPN. Plan close monitoring of PO intake and appropriate wean off TPN.   Pharmacy can likely start transition to cyclic TPN tomorrow.   Vanessa Kick RD, LDN Clinical Nutrition Pager listed in [facility]ION

## 2019-06-15 NOTE — Progress Notes (Signed)
PROGRESS NOTE    Caleb Chambers  QPY:[id number] DOB: 1965-07-20 DOA: 05/12/2019 PCP: Patient, No Pcp Per    Brief Narrative:  Patient was admitted to the hospital with a working diagnosis of diabetes ketoacidosis complicated by acute kidney injury.Hospitalization has been complicated by severe necrotizing pancreatitis with pancreatic pseudocyst staph aureus infection  54 year old male who presented with diabetes ketoacidosis. He does have significant past medical history for hypertension, morbid obesity, type 2 diabetes mellitus and gout. He had a recent hospitalization for pancreatitis and diabetes ketoacidosis 01/4 to 01/13.At home patient reported ongoing abdominal pain, and poor oral intake. Poor compliance with insulin. On his initial physical examination blood pressure was 96/71, heart rate 102, respiratory rate 23, temperature 98.6, oxygenation 99%.His lungs were clear to auscultation bilaterally, heart S1-S2 present rhythmic, soft abdomen, right upper quadrant tenderness, no rebound, no lower extremity edema. Sodium 123, potassium 3.6, chloride 87, bicarb 14, glucose 609, BUN 57, creatinine 2.96.SARS COVID-19 negative.EKG 98 bpm, left axis deviation, normal intervals, sinus rhythm, no ST segment or T wave changes.  CT of the abdomen with marked severity acute pancreatitis, associated with a very large pancreatic fluid collection.  Patient received supportive medical therapy, repeat CT abdomen showed persistent pancreatitis with new scattered peripancreatic gas, extensive ill-defined peripancreatic fluid extending to the transverse colon to the root of mesentery.   Gastroenterology was consulted and patient underwent upper endoscopic ultrasonography(05/28/19),aAXIOScystogastrostomy tube was created and 1.2 L of fluid were removed(double-pigtail replaced throughAXIOS).Removed pigtails on 06/01/19, through upper endoscopy. Follow up EGD on 02/17, 02/19. On 02/22  double pigtails were replaced. 02/26: EGDdouble pigtails stents removed and performed necrosectomy procedure.  Cyst culture positive for staph aureus, patient has been receiving TPN and antibiotic therapy with vancomycinand metronidazole.   Assessment & Plan:   Principal Problem:   Pancreatic necrosis Active Problems:   Acute pancreatitis   Acute kidney injury ([place])   Morbid obesity (HCC)   DKA (diabetic ketoacidosis) (HCC)   Debility   Protein-calorie malnutrition, severe   Infected pancreatic pseudocyst   1. Necrotizing pancreatitis, infected pancreatic pseudocyst (Staph aureus), complicated by DKA present on admission.Patient is sp6endoscopic procedures, the more recent on 02.26, documented double pig tails removed andnecrosectomy procedure.  Tolerating well TPN, and continue to have a poor oral intake. On as needed zofran and oxycodone for nausea and pain control. On pantoprazole and simethicone.   Continue with antibiotic therapy with doxycycline and metronidazole, per GI recommendations.   Plan for EGD today .   2. Uncontrolled T2DM (Hgb T2W 58.0) complicated with DKA(resolved). Fasting glucose this am is164.Continue with insulin per TPN protocol plusinsulin sliding scale.  3. HTN.Continue with lisinopril, HZTC, and amlodipine for blood pressure control. On bumetanide for diuresis.   4. Hypokalemia, hyponatremia and hypomagnesemia. Na at 131, K at 3,9 and serum bicarbonate at 25.  Continue with TPN per pharmacy protocol.   5. Calorie protein malnutrition, severe,On nutritional supplements.  DVT prophylaxis:enoxaparin Code Status:full Family Communication:no family at the bedside Disposition Plan/ discharge barriers:pendingfinalize endoscopic procedures.    Nutrition Status: Nutrition Problem: Severe Malnutrition Etiology: acute illness(pancreatitis) Signs/Symptoms: energy intake < or equal to 50% for > or equal to 5 days,  moderate fat depletion, mild fat depletion, moderate muscle depletion, mild muscle depletion Interventions: Tube feeding    Consultants:  GI  Procedures:  02/11, 02/15, 02/17, 02/22 EGD/ EUS  02/26  Antimicrobials:  Doxycycline and metronidazole   Subjective: Patient is feeling well, this am, his abdominal pain has improved, continue to have  gas distention, bu no nausea or vomiting. No chest pain or dyspnea.   Objective: Vitals:   06/14/19 2331 06/14/19 2332 06/15/19 0318 06/15/19 0751  BP: (!) 126/59 127/68 130/87 117/81  Pulse: 97  95 (!) 102  Resp: 19  (!) 22 19  Temp: 98.5 F (36.9 C)  98.6 F (37 C) 98.6 F (37 C)  TempSrc: Oral  Oral Oral  SpO2: 98%  100% 97%  Weight:   133.7 kg   Height:        Intake/Output Summary (Last 24 hours) at 06/15/2019 0823 Last data filed at 06/15/2019 0800 Gross per 24 hour  Intake 1241.96 ml  Output 2775 ml  Net -1533.04 ml   Filed Weights   06/13/19 0500 06/14/19 0405 06/15/19 0318  Weight: 135 kg 135.1 kg 133.7 kg    Examination:   General: Not in pain or dyspnea,. Deconditioned  Neurology: Awake and alert, non focal  E ENT: mild pallor, no icterus, oral mucosa moist Cardiovascular: No JVD. S1-S2 present, rhythmic, no gallops, rubs, or murmurs. No lower extremity edema. Pulmonary: positive breath sounds bilaterally, adequate air movement, no wheezing, rhonchi or rales. Gastrointestinal. Abdomen distended, no organomegaly, non tender, no rebound or guarding Skin. No rashes Musculoskeletal: no joint deformities     Data Reviewed: I have personally reviewed following labs and imaging studies  CBC: Recent Labs  Lab 06/11/19 0950 06/12/19 0352 06/15/19 0403  WBC 5.2 6.0 5.3  NEUTROABS 3.6 3.7 3.1  HGB 8.0* 7.8* 7.5*  HCT 27.1* 25.5* 24.8*  MCV 85.8 84.4 84.4  PLT 295 250 217   Basic Metabolic Panel: Recent Labs  Lab 06/09/19 0630 06/09/19 0630 06/10/19 0413 06/11/19 0427 06/11/19 0950  06/12/19 0352 06/13/19 0415 06/15/19 0403  NA 137   < > 136  --  135 129* 133* 131*  K 3.9   < > 3.8  --  3.9 3.7 3.7 3.9  CL 109   < > 103  --  103 98 100 99  CO2 24   < > 25  --  25 24 26 25   GLUCOSE 160*   < > 182*  --  201* 254* 290* 164*  BUN 8   < > 8  --  6 7 8 10   CREATININE 0.64   < > 0.56*  --  0.76 0.68 0.62 0.53*  CALCIUM 7.9*   < > 8.2*  --  8.1* 7.9* 7.9* 8.1*  MG 1.6*   < > 1.6* 1.7  --  1.7 1.7 1.6*  PHOS 3.3  --  3.7  --   --  3.3  --  3.1   < > = values in this interval not displayed.   GFR: Estimated Creatinine Clearance: 150.8 mL/min (A) (by C-G formula based on SCr of 0.53 mg/dL (L)). Liver Function Tests: Recent Labs  Lab 06/12/19 0352 06/15/19 0403  AST 11* 9*  ALT 8 8  ALKPHOS 61 57  BILITOT 0.2* 0.3  PROT 6.9 7.1  ALBUMIN 1.6* 1.6*   No results for input(s): LIPASE, AMYLASE in the last 168 hours. No results for input(s): AMMONIA in the last 168 hours. Coagulation Profile: No results for input(s): INR, PROTIME in the last 168 hours. Cardiac Enzymes: No results for input(s): CKTOTAL, CKMB, CKMBINDEX, TROPONINI in the last 168 hours. BNP (last 3 results) No results for input(s): PROBNP in the last 8760 hours. HbA1C: No results for input(s): HGBA1C in the last 72 hours. CBG: Recent Labs  Lab 06/14/19 [phone]  06/14/19 1108 06/14/19 1624 06/14/19 2134 06/15/19 0615  GLUCAP 207* 237* 231* 169* 154*   Lipid Profile: Recent Labs    06/15/19 0404  TRIG 95   Thyroid Function Tests: No results for input(s): TSH, T4TOTAL, FREET4, T3FREE, THYROIDAB in the last 72 hours. Anemia Panel: No results for input(s): VITAMINB12, FOLATE, FERRITIN, TIBC, IRON, RETICCTPCT in the last 72 hours.    Radiology Studies: I have reviewed all of the imaging during this hospital visit personally     Scheduled Meds: . sodium chloride   Intravenous Once  . allopurinol  300 mg Oral Daily  . amLODipine  10 mg Oral Daily  . bumetanide  0.5 mg Oral Daily  .  chlorhexidine  15 mL Mouth Rinse BID  . Chlorhexidine Gluconate Cloth  6 each Topical Daily  . diphenoxylate-atropine  2 tablet Oral BID  . doxycycline  100 mg Oral Q12H  . enoxaparin (LOVENOX) injection  40 mg Subcutaneous Q24H  . feeding supplement (ENSURE ENLIVE)  237 mL Oral TID BM  . feeding supplement (PRO-STAT SUGAR FREE 64)  30 mL Oral TID  . hydrochlorothiazide  12.5 mg Oral Daily  . insulin aspart  0-20 Units Subcutaneous TID WC  . insulin aspart  0-5 Units Subcutaneous QHS  . lipase/protease/amylase  36,000 Units Oral TID AC  . lisinopril  20 mg Oral Daily  . metroNIDAZOLE  500 mg Oral Q8H  . morphine  30 mg Oral Q12H  . multivitamin with minerals  1 tablet Oral Daily  . pantoprazole  40 mg Oral Daily   Continuous Infusions: . sodium chloride 20 mL/hr at 06/15/19 0400  . magnesium sulfate bolus IVPB    . TPN ADULT (ION) Stopped (06/15/19 0359)  . TPN ADULT (ION)       LOS: 34 days        Adelis Docter Annett Gula, MD

## 2019-06-15 NOTE — Interval H&P Note (Signed)
History and Physical Interval Note:  06/15/2019 2:06 PM  Caleb Chambers  has presented today for surgery, with the diagnosis of Pancreatic Necrosis.  The various methods of treatment have been discussed with the patient and family. After consideration of risks, benefits and other options for treatment, the patient has consented to  Procedure(s): ESOPHAGOGASTRODUODENOSCOPY (EGD) with NECROSECTOMY (N/A) ENDOROTOR (N/A) as a surgical intervention.  The patient's history has been reviewed, patient examined, no change in status, stable for surgery.  I have reviewed the patient's chart and labs.  Questions were answered to the patient's satisfaction.     [employer]

## 2019-06-15 NOTE — Progress Notes (Addendum)
PHARMACY - TOTAL PARENTERAL NUTRITION CONSULT NOTE  Indication:  Infected pancreatic pseudocyst  Patient Measurements: Height: 5\' 11"  (180.3 cm) Weight: 294 lb 12.1 oz (133.7 kg) IBW/kg (Calculated) : 75.3 TPN AdjBW (KG): 91.7 Body mass index is 41.11 kg/m.  Assessment:  75 YOM presented on 05/12/19 with DKA.  Patient was recently discharged after treatment for DKA and acute pancreatitis.  CT showed worsening pancreatitis and the development of a fluid collection.  Patient was started on tube feeding on 1/28 and tolerated it well through 2/10.  A clear liquid diet was initiated on 05/22/19 but intake was minimal, and was off and on a clear diet until 2/15.  EGD on 2/11 showed esophagitis/duodenitis and patient underwent pancreatic and biliary stent placements on 2/12.  He is s/p necrosectomy for pancreatic pseudocyst/necrosis on 2/15 and again on 2/17.  Feeding tube placement delayed due to frequent procedures.  Patient was advanced to a cardiac diet on 2/15 and intake has been inadequate.  In setting of severe malutrition, Pharmacy consulted to manage TPN.  Glucose / Insulin: hx IDDM admitted with DKA - A1c 13.2%.  CBGs remain elevated but starting to trend down at lower 200s with 75 units of insulin in TPN, required 18 units rSSI/24hrs (discontinued Lantus to prevent errors for now but if transition off TPN, would change back to Lantus) *was on Lantus 45/20 BID and significant amount of SSI while on goal rate TF/oral diet  Electrolytes: Labs 2/27: Na 131, K 3.9 (on Bumex), Mag 1.6 (will replace, on Bumex), Phos 3.1, CoCa 9.8 Renal: SCr 0.62 (stable), BUN WNL. LFTs / TGs: LFTs / tbili / TG within normal limits Prealbumin / albumin: Prealbumin 8.3>>7.5, albumin low at 1.6 Intake / Output; MIVF: UOP 0.7 ml/kg/hr; LBM 2/25 x2 GI Imaging: none since TPN consult Surgeries / Procedures:  2/19 necrosectomy, no gross lesion in esophagus, erythematous mucosa 2/22 necrosectomy 2/26 repeat  necrosectomy 3/1 repeat necrosectomy (planned)  Central access: PICC placed 06/05/19 TPN start date: 06/05/19  Nutritional Goals (per RD rec on 2/25): 2300-2500 kCal, 130-145g protein, >2L fluid per day  Current Nutrition:  Heart diet - ongoing calorie count this weekend (50% recorded on 2/28) Ensure Enlive - refusing Prostat - refusing TPN   Plan:   Continue concentrated TPN to goal rate of 80 ml/hr.  TPN provides 142g AA, 326g CHO and 72g ILE for a total of 2398 kCal, meeting ~100% of patient needs Electrolytes in 2399 increased Na; increase K (115 mEq/d), further increase Mag and continue decreased Phos. Cl:Ac 1:1 for now Continue daily PO multivitamin (no multivitamin and trace elements in TPN)  Continue resistant SSI to Q4H +  increase regular insulin in TPN bag to 75 units (39 units/L) Discontinued Lantus -- will give all insulin in TPN as this is safer and will help prevent issues if TPN were held as well as prevent lows if/when transition to cyclic TPN. However, if transition back to enteral diet, would resume Lantus and transition.   Giver Mag 2 gms IV x 1  F/U labs, CBGs, ongoing calorie count results versus ability to place feeding tube, po intake and discharge nutrition plans If plan remains for home TPN - will plan to cycle TPN before discharge (not currently pending ongoing calorie count).  [id number], PharmD, [facility] Clinical Pharmacist Please see AMION for all Pharmacists' Contact Phone Numbers 06/15/2019, 7:49 AM

## 2019-06-15 NOTE — Plan of Care (Signed)
  Problem: Education: Goal: Ability to describe self-care measures that may prevent or decrease complications (Diabetes Survival Skills Education) will improve Outcome: Progressing Goal: Individualized Educational Video(s) Outcome: Progressing   Problem: Cardiac: Goal: Ability to maintain an adequate cardiac output will improve Outcome: Progressing   Problem: Health Behavior/Discharge Planning: Goal: Ability to identify and utilize available resources and services will improve Outcome: Progressing Goal: Ability to manage health-related needs will improve Outcome: Progressing   Problem: Fluid Volume: Goal: Ability to achieve a balanced intake and output will improve Outcome: Progressing   Problem: Metabolic: Goal: Ability to maintain appropriate glucose levels will improve Outcome: Progressing   Problem: Nutritional: Goal: Maintenance of adequate nutrition will improve Outcome: Progressing Goal: Maintenance of adequate weight for body size and type will improve Outcome: Progressing   Problem: Respiratory: Goal: Will regain and/or maintain adequate ventilation Outcome: Progressing   Problem: Urinary Elimination: Goal: Ability to achieve and maintain adequate renal perfusion and functioning will improve Outcome: Progressing   Problem: Education: Goal: Knowledge of General Education information will improve Description: Including pain rating scale, medication(s)/side effects and non-pharmacologic comfort measures Outcome: Progressing   Problem: Health Behavior/Discharge Planning: Goal: Ability to manage health-related needs will improve Outcome: Progressing   Problem: Clinical Measurements: Goal: Ability to maintain clinical measurements within normal limits will improve Outcome: Progressing Goal: Will remain free from infection Outcome: Progressing Goal: Diagnostic test results will improve Outcome: Progressing Goal: Respiratory complications will improve Outcome:  Progressing Goal: Cardiovascular complication will be avoided Outcome: Progressing   Problem: Activity: Goal: Risk for activity intolerance will decrease Outcome: Progressing   Problem: Nutrition: Goal: Adequate nutrition will be maintained Outcome: Progressing   Problem: Coping: Goal: Level of anxiety will decrease Outcome: Progressing   Problem: Elimination: Goal: Will not experience complications related to bowel motility Outcome: Progressing Goal: Will not experience complications related to urinary retention Outcome: Progressing   Problem: Pain Managment: Goal: General experience of comfort will improve Outcome: Progressing   Problem: Safety: Goal: Ability to remain free from injury will improve Outcome: Progressing   Problem: Skin Integrity: Goal: Risk for impaired skin integrity will decrease Outcome: Progressing   

## 2019-06-15 NOTE — Op Note (Signed)
[facility] Patient Name: Caleb Chambers Procedure Date : 06/15/2019 MRN: [id number] Attending MD: Justice Britain , MD Date of Birth: September 25, 1965 CSN: [id number] Age: 54 Admit Type: Inpatient Procedure:                Upper GI endoscopy Indications:              Pancreatic necrosis Providers:                Justice Britain, MD, Burtis Junes, RN, Elspeth Cho Tech., Technician, Lillie Fragmin, CRNA Referring MD:             Carol Ada, MD, [facility] Medicines:                General Anesthesia, Cipro 007 mg IV Complications:            No immediate complications. Estimated Blood Loss:     Estimated blood loss was minimal. Procedure:                Pre-Anesthesia Assessment:                           - Prior to the procedure, a History and Physical                            was performed, and patient medications and                            allergies were reviewed. The patient's tolerance of                            previous anesthesia was also reviewed. The risks                            and benefits of the procedure and the sedation                            options and risks were discussed with the patient.                            All questions were answered, and informed consent                            was obtained. Prior Anticoagulants: The patient has                            taken no previous anticoagulant or antiplatelet                            agents. ASA Grade Assessment: III - A patient with                            severe systemic disease. After reviewing the risks  and benefits, the patient was deemed in                            satisfactory condition to undergo the procedure.                           After obtaining informed consent, the endoscope was                            passed under direct vision. Throughout the                            procedure, the patient's  blood pressure, pulse, and                            oxygen saturations were monitored continuously. The                            [id number] ([id number] Olympus therapeutic                            gastroscope was introduced through the mouth, and                            advanced to the second part of duodenum. The upper                            GI endoscopy was accomplished without difficulty.                            The patient tolerated the procedure. Scope In: Scope Out: Findings:      No gross lesions were noted in the proximal esophagus, in the mid       esophagus and in the distal esophagus.      A previously placed AXIOS cystgastrostomy stent was found on the       posterior wall of the stomach with two double pigtail stents in place.       The two double pigtail stents were removed from the cystgastrostomy with       a snare. The walled off necrotic cyst cavity was entered. The cyst was       partially filled with fluid and black necrotic tissue that was pasty and       adherent to the cyst wall. Necrosectomy was performed with a Raptor       grasping device, snare and Endorotor Powered Endoscopic Debrider,       requiring multiple intubations of the cyst. We went for 2-hours today.       At the conclusion of the procedure, some necrotic tissue and a large       amount of pink, viable tissue was found within the pseudocyst on direct       vision. I am finally able to see more than 3/4 of the cyst wall. Two 10       French x 5 cm double pigtail stents were inserted across the AXIOS       cystgastrostomy tract.      Patchy mildly  erythematous mucosa without bleeding was found in the       entire examined stomach.      No gross lesions were noted in the duodenal bulb, in the first portion       of the duodenum and in the second portion of the duodenum. Impression:               - No gross lesions in esophagus.                           - Pre-existing AXIOS  cystgastrostomy stent. Double                            pigtail stents removed. Necrosectomy was performed.                            Double pigtail stents replaced.                           - Erythematous mucosa in the stomach.                           - No gross lesions in the duodenal bulb, in the                            first portion of the duodenum and in the second                            portion of the duodenum. Recommendation:           - The patient will be observed post-procedure,                            until all discharge criteria are met.                           - Return patient to hospital ward for ongoing care.                           - Resume previous diet.                           - Continue present medications.                           - Will discuss with Dr. Benson Norway, consideration of                            repeat cross-sectional imaging on 3/2 with possible                            last Necrosectomy on 3/3 with hope for discharge                            later this week. Will tentatively place for last  necrosectomy on Wednesday PM.                           - Observe patient's clinical course.                           - The findings and recommendations were discussed                            with the patient.                           - The findings and recommendations were discussed                            with the referring physician. Procedure Code(s):        --- Professional ---                           [phone], Esophagogastroduodenoscopy, flexible,                            transoral; diagnostic, including collection of                            specimen(s) by brushing or washing, when performed                            (separate procedure)                           [phone], Unlisted procedure, pancreas Diagnosis Code(s):        --- Professional ---                           Z97.8, Presence of other  specified devices                           K31.89, Other diseases of stomach and duodenum                           K86.89, Other specified diseases of pancreas CPT copyright 2019 American [employer] Association. All rights reserved. The codes documented in this report are preliminary and upon coder review may  be revised to meet current compliance requirements. Justice Britain, MD 06/15/2019 5:18:59 PM Number of Addenda: 0

## 2019-06-15 NOTE — Progress Notes (Signed)
Patient via Wheelchair to Endoscopy

## 2019-06-15 NOTE — Anesthesia Procedure Notes (Signed)
Procedure Name: Intubation Date/Time: 06/15/2019 2:52 PM Performed by: Bryson Corona, CRNA Pre-anesthesia Checklist: Patient identified, Emergency Drugs available, Suction available and Patient being monitored Patient Re-evaluated:Patient Re-evaluated prior to induction Oxygen Delivery Method: Circle System Utilized Preoxygenation: Pre-oxygenation with 100% oxygen Induction Type: IV induction Laryngoscope Size: Mac and 4 Grade View: Grade I Tube type: Oral Tube size: 7.5 mm Number of attempts: 1 Airway Equipment and Method: Stylet and Oral airway Placement Confirmation: ETT inserted through vocal cords under direct vision,  positive ETCO2 and breath sounds checked- equal and bilateral Secured at: 23 cm Tube secured with: Tape Dental Injury: Teeth and Oropharynx as per pre-operative assessment

## 2019-06-15 NOTE — Anesthesia Preprocedure Evaluation (Addendum)
Anesthesia Evaluation  Patient identified by MRN, date of birth, ID band Patient awake    Reviewed: Allergy & Precautions, NPO status , Patient's Chart, lab work & pertinent test results  Airway Mallampati: II  TM Distance: >3 FB Neck ROM: Full    Dental  (+) Chipped, Dental Advisory Given,    Pulmonary neg pulmonary ROS,    Pulmonary exam normal breath sounds clear to auscultation       Cardiovascular hypertension, Pt. on medications negative cardio ROS Normal cardiovascular exam Rhythm:Regular Rate:Normal     Neuro/Psych negative neurological ROS  negative psych ROS   GI/Hepatic Neg liver ROS, GERD  Medicated,  Endo/Other  diabetes, Oral Hypoglycemic Agents, Insulin DependentMorbid obesity  Renal/GU negative Renal ROS  negative genitourinary   Musculoskeletal negative musculoskeletal ROS (+)   Abdominal   Peds  Hematology negative hematology ROS (+)   Anesthesia Other Findings Pancreatic necrosis  Reproductive/Obstetrics                            Anesthesia Physical Anesthesia Plan  ASA: III  Anesthesia Plan: General   Post-op Pain Management:    Induction: Intravenous  PONV Risk Score and Plan: 2 and Midazolam, Dexamethasone and Ondansetron  Airway Management Planned: Oral ETT  Additional Equipment:   Intra-op Plan:   Post-operative Plan: Extubation in OR  Informed Consent: I have reviewed the patients History and Physical, chart, labs and discussed the procedure including the risks, benefits and alternatives for the proposed anesthesia with the patient or authorized representative who has indicated his/her understanding and acceptance.     Dental advisory given  Plan Discussed with: CRNA  Anesthesia Plan Comments:         Anesthesia Quick Evaluation

## 2019-06-16 LAB — GLUCOSE, CAPILLARY
Glucose-Capillary: 372 mg/dL — ABNORMAL HIGH (ref 70–99)
Glucose-Capillary: 362 mg/dL — ABNORMAL HIGH (ref 70–99)
Glucose-Capillary: 366 mg/dL — ABNORMAL HIGH (ref 70–99)
Glucose-Capillary: 291 mg/dL — ABNORMAL HIGH (ref 70–99)

## 2019-06-16 MED ORDER — INSULIN ASPART 100 UNIT/ML ~~LOC~~ SOLN
0.0000 [IU] | SUBCUTANEOUS | Status: DC
  Administered 2019-06-16: 20 [IU] via SUBCUTANEOUS
  Administered 2019-06-16: 3 [IU] via SUBCUTANEOUS
  Administered 2019-06-17 (×2): 11 [IU] via SUBCUTANEOUS
  Administered 2019-06-18: 20 [IU] via SUBCUTANEOUS

## 2019-06-16 MED ORDER — INSULIN ASPART 100 UNIT/ML ~~LOC~~ SOLN: 0.0000 [IU] | SUBCUTANEOUS | Status: DC

## 2019-06-16 MED ORDER — INSULIN DETEMIR 100 UNIT/ML ~~LOC~~ SOLN
10.0000 [IU] | Freq: Once | SUBCUTANEOUS | Status: AC
  Administered 2019-06-16: 10 [IU] via SUBCUTANEOUS
  Filled 2019-06-16: qty 0.1

## 2019-06-16 MED ORDER — STERILE WATER FOR INJECTION IV SOLN
INTRAVENOUS | Status: AC
  Filled 2019-06-16: qty 947.2

## 2019-06-16 NOTE — Progress Notes (Signed)
PHARMACY - TOTAL PARENTERAL NUTRITION CONSULT NOTE  Indication:  Infected pancreatic pseudocyst  Patient Measurements: Height: 5\' 11"  (180.3 cm) Weight: 292 lb 8.8 oz (132.7 kg)(Scale B) IBW/kg (Calculated) : 75.3 TPN AdjBW (KG): 91.7 Body mass index is 40.8 kg/m.  Assessment:  64 YOM presented on 05/12/19 with DKA.  Patient was recently discharged after treatment for DKA and acute pancreatitis.  CT showed worsening pancreatitis and the development of a fluid collection.  Patient was started on tube feeding on 1/28 and tolerated it well through 2/10.  A clear liquid diet was initiated on 05/22/19 but intake was minimal, and was off and on a clear diet until 2/15.  EGD on 2/11 showed esophagitis/duodenitis and patient underwent pancreatic and biliary stent placements on 2/12.  He is s/p necrosectomy for pancreatic pseudocyst/necrosis on 2/15 and again on 2/17.  Feeding tube placement delayed due to frequent procedures.  Patient was advanced to a cardiac diet on 2/15 and intake has been inadequate.  In setting of severe malutrition, Pharmacy consulted to manage TPN.  Glucose / Insulin: hx IDDM admitted with DKA - A1c 13.2%.  CBGs remain elevated went up yesterday into the 300s with 75 units of insulin in TPN, required 24 units rSSI/24hrs (discontinued Lantus to prevent errors for now but if transition off TPN, would change back to Lantus) *was on Lantus 45/20 BID and significant amount of SSI while on goal rate TF/oral diet  Electrolytes: Labs 3/1: Na 131, K 3.9 (on Bumex), Mag 1.6 (will replace, on Bumex), Phos 3.1, CoCa 9.8 Renal: SCr 0.62 (stable), BUN WNL. LFTs / TGs: LFTs / tbili / TG within normal limits Prealbumin / albumin: Prealbumin 8.3>>7.5, albumin low at 1.6 Intake / Output; MIVF: UOP 0.7 ml/kg/hr; LBM 2/25 x2 GI Imaging: none since TPN consult Surgeries / Procedures:  2/19 necrosectomy, no gross lesion in esophagus, erythematous mucosa 2/22 necrosectomy 2/26 repeat  necrosectomy 3/1 repeat necrosectomy  3/3 repeat necrosectomy (planned)  Central access: PICC placed 06/05/19 TPN start date: 06/05/19  Nutritional Goals (per RD rec on 2/25): 2300-2500 kCal, 130-145g protein, >2L fluid per day  Current Nutrition:  Heart diet - ongoing calorie count this weekend (50% recorded on 2/28) Ensure Enlive - refusing Prostat - refusing TPN   Plan:   Continue concentrated TPN to goal rate of 80 ml/hr.  TPN provides 142g AA, 326g CHO and 72g ILE for a total of 2398 kCal, meeting ~100% of patient needs *Plan for short term home TPN, chose not to proceed with cyclic today given significant rise in glucose overnight (unclear cause - although it appears TPN was held during the day yesterday).  Electrolytes in 2399 increased Na; increase K (115 mEq/d), further increase Mag and continue decreased Phos. Cl:Ac 1:1 for now Continue daily PO multivitamin (no multivitamin and trace elements in TPN)  Continue resistant SSI to Q4H +  increase regular insulin in TPN bag to 75 units (39 units/L) Discontinued Lantus -- will give all insulin in TPN as this is safer and will help prevent issues if TPN were held as well as prevent lows if/when transition to cyclic TPN. However, if transition back to enteral diet, would resume Lantus and transition.   F/U labs, CBGs, ongoing calorie count results versus ability to place feeding tube, po intake and discharge nutrition plans If plan remains for home TPN - will plan to cycle TPN before discharge.  [id number], PharmD, [facility] Clinical Pharmacist Please see AMION for all Pharmacists' Contact Phone Numbers 06/16/2019, 7:37  AM

## 2019-06-16 NOTE — Plan of Care (Signed)

## 2019-06-16 NOTE — Progress Notes (Addendum)
Inpatient Diabetes Program Recommendations  AACE/ADA: New Consensus Statement on Inpatient Glycemic Control (2015)  Target Ranges:  Prepandial:   less than 140 mg/dL      Peak postprandial:   less than 180 mg/dL (1-2 hours)      Critically ill patients:  140 - 180 mg/dL   Lab Results  Component Value Date   GLUCAP 362 (H) 06/16/2019   HGBA1C 13.2 (H) 04/22/2019    Review of Glycemic Control Results for Caleb Chambers, CHIRICO (MRN [id number]) as of 06/16/2019 12:39  Ref. Range 06/15/2019 17:19 06/15/2019 20:58 06/16/2019 06:34 06/16/2019 11:32  Glucose-Capillary Latest Ref Range: 70 - 99 mg/dL 412 (H) 878 (H) 676 (H) 362 (H)   Diabetes history: Type 2 DM Outpatient Diabetes medications: Basaglar 20-23 units QD, Metformin 500 mg BID Current orders for Inpatient glycemic control: Novolog 0-20 units TID, Novolog 0-5 units QHS Decadron 10 mg x 1 Ensure @ 237 ml + cardiac diet  Inpatient Diabetes Program Recommendations:    Noted glucose trends increased, assuming related to Decadron administration. Discussed plan of care with TPN with pharmD as insulin within TPN remaining same value.   In preparation for surgery in the AM, consider:  -Novolog 0-15 units Q4H  Will reevaluate on 3/3 for transitioning back to SQ insulin and removing from TPN.  Thanks, Lujean Rave, MSN, RNC-OB Diabetes Coordinator [phone] (8a-5p)

## 2019-06-16 NOTE — Progress Notes (Addendum)
PROGRESS NOTE    Caleb Chambers  RJJ:[id number] DOB: [birth year] DOA: 05/12/2019 PCP: Patient, No Pcp Per    Brief Narrative:  Patient was admitted to the hospital with a working diagnosis of diabetes ketoacidosis complicated by acute kidney injury.Hospitalization has been complicated by severe necrotizing pancreatitis with pancreatic pseudocyst staph aureus infection  54 year old male who presented with diabetes ketoacidosis. He does have significant past medical history for hypertension, morbid obesity, type 2 diabetes mellitus and gout. He had a recent hospitalization for pancreatitis and diabetes ketoacidosis 01/4 to 01/13.At home patient reported ongoing abdominal pain, and poor oral intake. Poor compliance with insulin. On his initial physical examination blood pressure was 96/71, heart rate 102, respiratory rate 23, temperature 98.6, oxygenation 99%.His lungs were clear to auscultation bilaterally, heart S1-S2 present rhythmic, soft abdomen, right upper quadrant tenderness, no rebound, no lower extremity edema. Sodium 123, potassium 3.6, chloride 87, bicarb 14, glucose 609, BUN 57, creatinine 2.96.SARS COVID-19 negative.EKG 98 bpm, left axis deviation, normal intervals, sinus rhythm, no ST segment or T wave changes.  CT of the abdomen with marked severity acute pancreatitis, associated with a very large pancreatic fluid collection.  Patient received supportive medical therapy, repeat CT abdomen showed persistent pancreatitis with new scattered peripancreatic gas, extensive ill-defined peripancreatic fluid extending to the transverse colon to the root of mesentery.   Gastroenterology was consulted and patient underwent upper endoscopic ultrasonography(05/28/19),aAXIOScystogastrostomy tube was created and 1.2 L of fluid were removed(double-pigtail replaced throughAXIOS).Removed pigtails on 06/01/19, through upper endoscopy. Follow up EGD on 02/17 and 02/19. On 02/22  double pigtails were replaced. 02/26: EGDdouble pigtails stents removed and performed necrosectomy procedure. 03/01: EGD with necrosectomy and double pigtails stents replaced.   Cyst culture positive for staph aureus, patient has been receiving TPN and antibiotic therapy with vancomycinand metronidazole.  Slowly has been improving, continue to have poor oral intake, plan for last EGD procedure in am, and possible dc home later in the week.    Assessment & Plan:   Principal Problem:   Pancreatic necrosis Active Problems:   Acute pancreatitis   Acute kidney injury (HCC)   Morbid obesity (HCC)   DKA (diabetic ketoacidosis) (HCC)   Debility   Protein-calorie malnutrition, severe   Infected pancreatic pseudocyst   1. Necrotizing pancreatitis, infected pancreatic pseudocyst (Staph aureus), complicated by DKA present on admission.Patient is sp7endoscopic procedures, the more recent on 03/01, documented double pig tails stents placed andnecrosectomy procedure.  Patient continue with TPN,  oral intake continue to be suboptimal.Will continue supportive medical therapy with as needed zofran and oxycodone. Continue with pantoprazole and simethicone.   Antibiotic therapy with doxycycline and metronidazole, duration per GI recommendations.  Patient will have his last endoscopic procedure in am, with plan for dc home later this week, he may need to continue TPN at discharge.   2. Uncontrolled T2DM (Hgb A1c 13.2) complicated with DKA(resolved). Fasting glucose this am is372. Patient getting insulin per TPNprotocolplussq insulin sliding scale. Due to persistent hyperglycemia, will add one dose of 10 units levemir and increase sliding scale to 4 H. Further insulin therapy per TPN protocol per pharmacy.   3. HTN. Blood pressure control withlisinopril, HZTC,andamlodipine. Continue with bumetanide for diuresis per his home regimen.  4. Hypokalemia, hyponatremia and  hypomagnesemia.Follow renal panel and electrolytes in am.   5. Severe calorie protein malnutrition. Obesity class 3 with BMI more than 40.Continue with oral nutritional supplements.PO intake has been sub-optimal.   DVT prophylaxis:enoxaparin Code Status:full Family Communication:no family at the bedside  Disposition Plan/ discharge barriers: patient from home, barriers to discharge necrotizing pancreatitis, requiring multiple endoscopic procedures. Plan for dc home later this week.   Nutrition Status: Nutrition Problem: Severe Malnutrition Etiology: acute illness(pancreatitis) Signs/Symptoms: energy intake < or equal to 50% for > or equal to 5 days, moderate fat depletion, mild fat depletion, moderate muscle depletion, mild muscle depletion Interventions: Tube feeding   Consultants:  GI  Procedures:EGD  02/11, 02/15, 02/17, 02/19, 02/22  02/26, 03/01  Antimicrobials:  Doxycycline and metronidazole   Subjective: Patient is feeling better, improved abdominal pain, but continue to have abdominal distention and gas, no nausea or vomiting and tolerating po well.   Objective: Vitals:   06/15/19 1804 06/15/19 2007 06/16/19 0449 06/16/19 0500  BP: 123/73 131/72 118/70   Pulse: 92     Resp: (!) 21 20    Temp:  98.9 F (37.2 C) 98.2 F (36.8 C)   TempSrc:  Oral Oral   SpO2: 99% 98%    Weight:    132.7 kg  Height:        Intake/Output Summary (Last 24 hours) at 06/16/2019 0830 Last data filed at 06/16/2019 0017 Gross per 24 hour  Intake 1522.53 ml  Output 2800 ml  Net -1277.47 ml   Filed Weights   06/14/19 0405 06/15/19 0318 06/16/19 0500  Weight: 135.1 kg 133.7 kg 132.7 kg    Examination:   General: Not in pain or dyspnea, deconditioned  Neurology: Awake and alert, non focal  E ENT: mild pallor, no icterus, oral mucosa moist Cardiovascular: No JVD. S1-S2 present, rhythmic, no gallops, rubs, or murmurs. No lower extremity edema. Pulmonary: positive  breath sounds bilaterally, adequate air movement, no wheezing, rhonchi or rales. Gastrointestinal. Abdomen mild distention with no organomegaly, non tender, no rebound or guarding Skin. No rashes Musculoskeletal: no joint deformities     Data Reviewed: I have personally reviewed following labs and imaging studies  CBC: Recent Labs  Lab 06/11/19 0950 06/12/19 0352 06/15/19 0403  WBC 5.2 6.0 5.3  NEUTROABS 3.6 3.7 3.1  HGB 8.0* 7.8* 7.5*  HCT 27.1* 25.5* 24.8*  MCV 85.8 84.4 84.4  PLT 295 250 494   Basic Metabolic Panel: Recent Labs  Lab 06/10/19 0413 06/11/19 0427 06/11/19 0950 06/12/19 0352 06/13/19 0415 06/15/19 0403  NA 136  --  135 129* 133* 131*  K 3.8  --  3.9 3.7 3.7 3.9  CL 103  --  103 98 100 99  CO2 25  --  25 24 26 25   GLUCOSE 182*  --  201* 254* 290* 164*  BUN 8  --  6 7 8 10   CREATININE 0.56*  --  0.76 0.68 0.62 0.53*  CALCIUM 8.2*  --  8.1* 7.9* 7.9* 8.1*  MG 1.6* 1.7  --  1.7 1.7 1.6*  PHOS 3.7  --   --  3.3  --  3.1   GFR: Estimated Creatinine Clearance: 150.2 mL/min (A) (by C-G formula based on SCr of 0.53 mg/dL (L)). Liver Function Tests: Recent Labs  Lab 06/12/19 0352 06/15/19 0403  AST 11* 9*  ALT 8 8  ALKPHOS 61 57  BILITOT 0.2* 0.3  PROT 6.9 7.1  ALBUMIN 1.6* 1.6*   No results for input(s): LIPASE, AMYLASE in the last 168 hours. No results for input(s): AMMONIA in the last 168 hours. Coagulation Profile: No results for input(s): INR, PROTIME in the last 168 hours. Cardiac Enzymes: No results for input(s): CKTOTAL, CKMB, CKMBINDEX, TROPONINI in the last 168  hours. BNP (last 3 results) No results for input(s): PROBNP in the last 8760 hours. HbA1C: No results for input(s): HGBA1C in the last 72 hours. CBG: Recent Labs  Lab 06/15/19 0615 06/15/19 1132 06/15/19 1719 06/15/19 2058 06/16/19 0634  GLUCAP 154* 164* 175* 305* 372*   Lipid Profile: Recent Labs    06/15/19 0404  TRIG 95   Thyroid Function Tests: No results  for input(s): TSH, T4TOTAL, FREET4, T3FREE, THYROIDAB in the last 72 hours. Anemia Panel: No results for input(s): VITAMINB12, FOLATE, FERRITIN, TIBC, IRON, RETICCTPCT in the last 72 hours.    Radiology Studies: I have reviewed all of the imaging during this hospital visit personally     Scheduled Meds: . sodium chloride   Intravenous Once  . allopurinol  300 mg Oral Daily  . amLODipine  10 mg Oral Daily  . bumetanide  0.5 mg Oral Daily  . chlorhexidine  15 mL Mouth Rinse BID  . Chlorhexidine Gluconate Cloth  6 each Topical Daily  . diphenoxylate-atropine  2 tablet Oral BID  . doxycycline  100 mg Oral Q12H  . enoxaparin (LOVENOX) injection  40 mg Subcutaneous Q24H  . feeding supplement (ENSURE ENLIVE)  237 mL Oral TID BM  . feeding supplement (PRO-STAT SUGAR FREE 64)  30 mL Oral TID  . hydrochlorothiazide  12.5 mg Oral Daily  . insulin aspart  0-20 Units Subcutaneous TID WC  . insulin aspart  0-5 Units Subcutaneous QHS  . lipase/protease/amylase  36,000 Units Oral TID AC  . lisinopril  20 mg Oral Daily  . metroNIDAZOLE  500 mg Oral Q8H  . morphine  30 mg Oral Q12H  . multivitamin with minerals  1 tablet Oral Daily  . pantoprazole  40 mg Oral Daily   Continuous Infusions: . TPN ADULT (ION) 80 mL/hr at 06/16/19 0400  . TPN ADULT (ION)       LOS: 35 days        Kla Bily Annett Gula, MD

## 2019-06-16 NOTE — Progress Notes (Signed)
Subjective: Feeling well.  No complaints.  Objective: Vital signs in last 24 hours: Temp:  [97.3 F (36.3 C)-98.9 F (37.2 C)] 98.2 F (36.8 C) (03/02 0449) Pulse Rate:  [92-102] 92 (03/01 1804) Resp:  [19-22] 20 (03/01 2007) BP: (117-146)/(69-88) 118/70 (03/02 0449) SpO2:  [96 %-100 %] 98 % (03/01 2007) Weight:  [132.7 kg] 132.7 kg (03/02 0500) Last BM Date: 06/14/19  Intake/Output from previous day: 03/01 0701 - 03/02 0700 In: 1522.5 [P.O.:120; I.V.:1402.5] Out: 3600 [Urine:3600] Intake/Output this shift: No intake/output data recorded.  General appearance: alert and no distress GI: soft, non-tender; bowel sounds normal; no masses,  no organomegaly  Lab Results: Recent Labs    06/15/19 0403  WBC 5.3  HGB 7.5*  HCT 24.8*  PLT 217   BMET Recent Labs    06/15/19 0403  NA 131*  K 3.9  CL 99  CO2 25  GLUCOSE 164*  BUN 10  CREATININE 0.53*  CALCIUM 8.1*   LFT Recent Labs    06/15/19 0403  PROT 7.1  ALBUMIN 1.6*  AST 9*  ALT 8  ALKPHOS 57  BILITOT 0.3   PT/INR No results for input(s): LABPROT, INR in the last 72 hours. Hepatitis Panel No results for input(s): HEPBSAG, HCVAB, HEPAIGM, HEPBIGM in the last 72 hours. C-Diff No results for input(s): CDIFFTOX in the last 72 hours. Fecal Lactopherrin No results for input(s): FECLLACTOFRN in the last 72 hours.  Studies/Results: No results found.  Medications:  Scheduled: . sodium chloride   Intravenous Once  . allopurinol  300 mg Oral Daily  . amLODipine  10 mg Oral Daily  . bumetanide  0.5 mg Oral Daily  . chlorhexidine  15 mL Mouth Rinse BID  . Chlorhexidine Gluconate Cloth  6 each Topical Daily  . diphenoxylate-atropine  2 tablet Oral BID  . doxycycline  100 mg Oral Q12H  . enoxaparin (LOVENOX) injection  40 mg Subcutaneous Q24H  . feeding supplement (ENSURE ENLIVE)  237 mL Oral TID BM  . feeding supplement (PRO-STAT SUGAR FREE 64)  30 mL Oral TID  . hydrochlorothiazide  12.5 mg Oral Daily  .  insulin aspart  0-20 Units Subcutaneous TID WC  . insulin aspart  0-5 Units Subcutaneous QHS  . lipase/protease/amylase  36,000 Units Oral TID AC  . lisinopril  20 mg Oral Daily  . metroNIDAZOLE  500 mg Oral Q8H  . morphine  30 mg Oral Q12H  . multivitamin with minerals  1 tablet Oral Daily  . pantoprazole  40 mg Oral Daily   Continuous: . TPN ADULT (ION) 80 mL/hr at 06/16/19 0400    Assessment/Plan: 1) Walled off necrosis. 2) Severe malnutrition.   The patient is clinically well.  He is agreeable to undergoing a repeat necrosectomy tomorrow.  The anticipation is that this will be his final session.  Subsequently he can be discharged home later this week.  Per Dietary he will need short term home TPN as his oral intake is below his caloric requirements.  Plan: 1) Necrosectomy tomorrow with Dr. Mansouraty. 2) Please arrange for home TPN in anticipation for discharge later this week.  LOS: 35 days   Karaline Buresh D 06/16/2019, 7:17 AM 

## 2019-06-16 NOTE — H&P (View-Only) (Signed)
Subjective: Feeling well.  No complaints.  Objective: Vital signs in last 24 hours: Temp:  [97.3 F (36.3 C)-98.9 F (37.2 C)] 98.2 F (36.8 C) (03/02 0449) Pulse Rate:  [92-102] 92 (03/01 1804) Resp:  [19-22] 20 (03/01 2007) BP: (117-146)/(69-88) 118/70 (03/02 0449) SpO2:  [96 %-100 %] 98 % (03/01 2007) Weight:  [132.7 kg] 132.7 kg (03/02 0500) Last BM Date: 06/14/19  Intake/Output from previous day: 03/01 0701 - 03/02 0700 In: 1522.5 [P.O.:120; I.V.:1402.5] Out: 3600 [Urine:3600] Intake/Output this shift: No intake/output data recorded.  General appearance: alert and no distress GI: soft, non-tender; bowel sounds normal; no masses,  no organomegaly  Lab Results: Recent Labs    06/15/19 0403  WBC 5.3  HGB 7.5*  HCT 24.8*  PLT 217   BMET Recent Labs    06/15/19 0403  NA 131*  K 3.9  CL 99  CO2 25  GLUCOSE 164*  BUN 10  CREATININE 0.53*  CALCIUM 8.1*   LFT Recent Labs    06/15/19 0403  PROT 7.1  ALBUMIN 1.6*  AST 9*  ALT 8  ALKPHOS 57  BILITOT 0.3   PT/INR No results for input(s): LABPROT, INR in the last 72 hours. Hepatitis Panel No results for input(s): HEPBSAG, HCVAB, HEPAIGM, HEPBIGM in the last 72 hours. C-Diff No results for input(s): CDIFFTOX in the last 72 hours. Fecal Lactopherrin No results for input(s): FECLLACTOFRN in the last 72 hours.  Studies/Results: No results found.  Medications:  Scheduled: . sodium chloride   Intravenous Once  . allopurinol  300 mg Oral Daily  . amLODipine  10 mg Oral Daily  . bumetanide  0.5 mg Oral Daily  . chlorhexidine  15 mL Mouth Rinse BID  . Chlorhexidine Gluconate Cloth  6 each Topical Daily  . diphenoxylate-atropine  2 tablet Oral BID  . doxycycline  100 mg Oral Q12H  . enoxaparin (LOVENOX) injection  40 mg Subcutaneous Q24H  . feeding supplement (ENSURE ENLIVE)  237 mL Oral TID BM  . feeding supplement (PRO-STAT SUGAR FREE 64)  30 mL Oral TID  . hydrochlorothiazide  12.5 mg Oral Daily  .  insulin aspart  0-20 Units Subcutaneous TID WC  . insulin aspart  0-5 Units Subcutaneous QHS  . lipase/protease/amylase  36,000 Units Oral TID AC  . lisinopril  20 mg Oral Daily  . metroNIDAZOLE  500 mg Oral Q8H  . morphine  30 mg Oral Q12H  . multivitamin with minerals  1 tablet Oral Daily  . pantoprazole  40 mg Oral Daily   Continuous: . TPN ADULT (ION) 80 mL/hr at 06/16/19 0400    Assessment/Plan: 1) Walled off necrosis. 2) Severe malnutrition.   The patient is clinically well.  He is agreeable to undergoing a repeat necrosectomy tomorrow.  The anticipation is that this will be his final session.  Subsequently he can be discharged home later this week.  Per Dietary he will need short term home TPN as his oral intake is below his caloric requirements.  Plan: 1) Necrosectomy tomorrow with Dr. Meridee Score. 2) Please arrange for home TPN in anticipation for discharge later this week.  LOS: 35 days   Caleb Chambers D 06/16/2019, 7:17 AM

## 2019-06-16 NOTE — Anesthesia Postprocedure Evaluation (Signed)
Anesthesia Post Note  Patient: PAVLOS YON  Procedure(s) Performed: ESOPHAGOGASTRODUODENOSCOPY (EGD) with NECROSECTOMY (N/A ) ENDOROTOR (N/A ) STENT REMOVAL PANCREATIC STENT PLACEMENT     Patient location during evaluation: PACU Anesthesia Type: General Level of consciousness: awake and alert Pain management: pain level controlled Vital Signs Assessment: post-procedure vital signs reviewed and stable Respiratory status: spontaneous breathing, nonlabored ventilation and respiratory function stable Cardiovascular status: blood pressure returned to baseline and stable Postop Assessment: no apparent nausea or vomiting Anesthetic complications: no    Last Vitals:  Vitals:   06/16/19 0939 06/16/19 1129  BP:  129/68  Pulse: 86 91  Resp: 19 20  Temp: 36.4 C 36.4 C  SpO2: 91% 100%    Last Pain:  Vitals:   06/16/19 1129  TempSrc: Oral  PainSc:                  Cecile Hearing

## 2019-06-17 ENCOUNTER — Encounter (HOSPITAL_COMMUNITY): Payer: Self-pay | Admitting: Internal Medicine

## 2019-06-17 ENCOUNTER — Inpatient Hospital Stay (HOSPITAL_COMMUNITY): Payer: BC Managed Care – PPO | Admitting: Certified Registered"

## 2019-06-17 ENCOUNTER — Encounter (HOSPITAL_COMMUNITY)
Admission: EM | Disposition: A | Discharge status: Home Health | Payer: Self-pay | Source: Home / Self Care | Attending: Internal Medicine

## 2019-06-17 DIAGNOSIS — Z789 Other specified health status: Secondary | ICD-10-CM

## 2019-06-17 DIAGNOSIS — E871 Hypo-osmolality and hyponatremia: Secondary | ICD-10-CM

## 2019-06-17 DIAGNOSIS — E1165 Type 2 diabetes mellitus with hyperglycemia: Secondary | ICD-10-CM

## 2019-06-17 HISTORY — PX: PANCREATIC STENT PLACEMENT: SHX5539

## 2019-06-17 HISTORY — PX: STENT REMOVAL: SHX6421

## 2019-06-17 HISTORY — PX: ENDOROTOR: SHX6859

## 2019-06-17 HISTORY — PX: ESOPHAGOGASTRODUODENOSCOPY: SHX5428

## 2019-06-17 LAB — GLUCOSE, CAPILLARY
Glucose-Capillary: 261 mg/dL — ABNORMAL HIGH (ref 70–99)
Glucose-Capillary: 189 mg/dL — ABNORMAL HIGH (ref 70–99)
Glucose-Capillary: 317 mg/dL — ABNORMAL HIGH (ref 70–99)
Glucose-Capillary: 320 mg/dL — ABNORMAL HIGH (ref 70–99)

## 2019-06-17 LAB — BASIC METABOLIC PANEL
Anion gap: 10 (ref 5–15)
BUN: 16 mg/dL (ref 6–20)
CO2: 24 mmol/L (ref 22–32)
Calcium: 8.3 mg/dL — ABNORMAL LOW (ref 8.9–10.3)
Chloride: 95 mmol/L — ABNORMAL LOW (ref 98–111)
Creatinine, Ser: 0.62 mg/dL (ref 0.61–1.24)
GFR calc Af Amer: >60 mL/min (ref >60)
GFR calc non Af Amer: >60 mL/min (ref >60)
Glucose, Bld: 282 mg/dL — ABNORMAL HIGH (ref 70–99)
Potassium: 3.8 mmol/L (ref 3.5–5.1)
Sodium: 129 mmol/L — ABNORMAL LOW (ref 135–145)

## 2019-06-17 LAB — PHOSPHORUS: Phosphorus: 3.2 mg/dL (ref 2.5–4.6)

## 2019-06-17 LAB — MAGNESIUM: Magnesium: 1.6 mg/dL — ABNORMAL LOW (ref 1.7–2.4)

## 2019-06-17 SURGERY — EGD (ESOPHAGOGASTRODUODENOSCOPY)
Anesthesia: General

## 2019-06-17 MED ORDER — INSULIN DETEMIR 100 UNIT/ML ~~LOC~~ SOLN
5.0000 [IU] | Freq: Two times a day (BID) | SUBCUTANEOUS | Status: DC
  Filled 2019-06-17 (×2): qty 0.05

## 2019-06-17 MED ORDER — FENTANYL CITRATE (PF) 100 MCG/2ML IJ SOLN
INTRAMUSCULAR | Status: AC
  Filled 2019-06-17: qty 2

## 2019-06-17 MED ORDER — DEXAMETHASONE SODIUM PHOSPHATE 10 MG/ML IJ SOLN
INTRAMUSCULAR | Status: DC | PRN
  Administered 2019-06-17: 10 mg via INTRAVENOUS

## 2019-06-17 MED ORDER — PROPOFOL 10 MG/ML IV BOLUS
INTRAVENOUS | Status: DC | PRN
  Administered 2019-06-17: 150 mg via INTRAVENOUS

## 2019-06-17 MED ORDER — LIDOCAINE 2% (20 MG/ML) 5 ML SYRINGE
INTRAMUSCULAR | Status: DC | PRN
  Administered 2019-06-17: 80 mg via INTRAVENOUS

## 2019-06-17 MED ORDER — PHENYLEPHRINE HCL-NACL 10-0.9 MG/250ML-% IV SOLN
INTRAVENOUS | Status: DC | PRN
  Administered 2019-06-17: 50 ug/min via INTRAVENOUS

## 2019-06-17 MED ORDER — MAGNESIUM SULFATE 4 GM/100ML IV SOLN
4.0000 g | Freq: Once | INTRAVENOUS | Status: DC
  Filled 2019-06-17: qty 100

## 2019-06-17 MED ORDER — SODIUM CHLORIDE 0.9 % IV SOLN: INTRAVENOUS | Status: DC

## 2019-06-17 MED ORDER — ONDANSETRON HCL 4 MG/2ML IJ SOLN
INTRAMUSCULAR | Status: DC | PRN
  Administered 2019-06-17: 4 mg via INTRAVENOUS

## 2019-06-17 MED ORDER — METOCLOPRAMIDE HCL 5 MG/ML IJ SOLN
INTRAMUSCULAR | Status: AC
  Filled 2019-06-17: qty 2

## 2019-06-17 MED ORDER — ROCURONIUM BROMIDE 50 MG/5ML IV SOSY
PREFILLED_SYRINGE | INTRAVENOUS | Status: DC | PRN
  Administered 2019-06-17 (×3): 20 mg via INTRAVENOUS
  Administered 2019-06-17: 60 mg via INTRAVENOUS

## 2019-06-17 MED ORDER — SUGAMMADEX SODIUM 200 MG/2ML IV SOLN
INTRAVENOUS | Status: DC | PRN
  Administered 2019-06-17: 300 mg via INTRAVENOUS

## 2019-06-17 MED ORDER — FENTANYL CITRATE (PF) 100 MCG/2ML IJ SOLN
INTRAMUSCULAR | Status: DC | PRN
  Administered 2019-06-17: 100 ug via INTRAVENOUS

## 2019-06-17 MED ORDER — METOCLOPRAMIDE HCL 5 MG/ML IJ SOLN
INTRAMUSCULAR | Status: DC | PRN
  Administered 2019-06-17: 5 mg via INTRAVENOUS

## 2019-06-17 MED ORDER — CIPROFLOXACIN IN D5W 400 MG/200ML IV SOLN
INTRAVENOUS | Status: AC
  Filled 2019-06-17: qty 200

## 2019-06-17 MED ORDER — STERILE WATER FOR INJECTION IV SOLN
INTRAVENOUS | Status: AC
  Filled 2019-06-17: qty 947.2

## 2019-06-17 MED ORDER — LACTATED RINGERS IV SOLN: INTRAVENOUS | Status: DC

## 2019-06-17 MED ORDER — CIPROFLOXACIN IN D5W 400 MG/200ML IV SOLN
INTRAVENOUS | Status: DC | PRN
  Administered 2019-06-17: 400 mg via INTRAVENOUS

## 2019-06-17 NOTE — Transfer of Care (Signed)
Immediate Anesthesia Transfer of Care Note  Patient: Caleb Chambers  Procedure(s) Performed: ESOPHAGOGASTRODUODENOSCOPY (EGD) with NECROSECTOMY (N/A ) ENDOROTOR (N/A ) STENT REMOVAL PANCREATIC STENT PLACEMENT  Patient Location: Endoscopy Unit  Anesthesia Type:General  Level of Consciousness: awake, alert  and oriented  Airway & Oxygen Therapy: Patient Spontanous Breathing and Patient connected to face mask oxygen  Post-op Assessment: Report given to RN, Post -op Vital signs reviewed and stable and Patient moving all extremities X 4  Post vital signs: Reviewed and stable  Last Vitals:  Vitals Value Taken Time  BP    Temp    Pulse 105 06/17/19 1652  Resp 32 06/17/19 1652  SpO2 100 % 06/17/19 1652  Vitals shown include unvalidated device data.  Last Pain:  Vitals:   06/17/19 1226  TempSrc: Temporal  PainSc: 0-No pain      Patients Stated Pain Goal: 3 (06/14/19 0705)  Complications: No apparent anesthesia complications

## 2019-06-17 NOTE — Progress Notes (Signed)
PROGRESS NOTE  KHAM ZUCKERMAN HFW:[id number] DOB: [birth year]   PCP: Patient, No Pcp Per  Patient is from: Home  DOA: 05/12/2019 LOS: 36  Brief Narrative / Interim history: 54 year old male with history of DM-2, HTN, morbid obesity, pancreatitis, noncompliance and gout presented with abdominal pain and poor oral intake and admitted with diabetic ketoacidosis, AKI and acute pancreatitis with pseudocyst.   Patient underwent multiple EGDs with cystogastrostomy tube creation and double-pigtail replacements.  Since culture positive for staph aureus.  Patient has been on vancomycin and metronidazole, and transitioned to doxycycline and metronidazole.  He has been on TPN for feeding.  Has been slowly improving.  Plan is for nephrostomy procedure today, and eventual discharge home with TPN later this week.  Subjective: No major events overnight or this morning.  Reports having significant abdominal pain last night.  Pain improved this morning.  He rates his pain 5/10 this morning.  He denies nausea, vomiting, diarrhea, chest pain or dyspnea.  Understands about the plan today.  Objective: Vitals:   06/16/19 1618 06/16/19 1947 06/17/19 0729 06/17/19 1226  BP: 123/72 125/72 113/71 (!) 143/72  Pulse: 87 92 84 84  Resp: 20  18 18   Temp: 97.9 F (36.6 C) 97.9 F (36.6 C) 98.5 F (36.9 C) (!) 97.1 F (36.2 C)  TempSrc: Oral Oral Oral Temporal  SpO2: 99% 98% 100% 100%  Weight:      Height:        Intake/Output Summary (Last 24 hours) at 06/17/2019 1630 Last data filed at 06/17/2019 1624 Gross per 24 hour  Intake 1353.61 ml  Output 1401 ml  Net -47.39 ml   Filed Weights   06/14/19 0405 06/15/19 0318 06/16/19 0500  Weight: 135.1 kg 133.7 kg 132.7 kg    Examination:  GENERAL: No acute distress.  Appears well.  HEENT: MMM.  Vision and hearing grossly intact.  NECK: Supple.  No apparent JVD.  RESP:  No IWOB. Good air movement bilaterally. CVS:  RRR. Heart sounds normal.  ABD/GI/GU:  Bowel sounds present. Soft.  Mild diffuse tenderness MSK/EXT:  Moves extremities. No apparent deformity. No edema.  SKIN: no apparent skin lesion or wound NEURO: Awake, alert and oriented appropriately.  No apparent focal neuro deficit. PSYCH: Calm. Normal affect.  Procedures:  2/11-EUS andAXIOScystogastrostomy tube creation with placement of double pigtail and removal of 1.2 L of fluid  2/15-removed pigtails  02/17 and 02/19-EGD 02/22 double pigtails were replaced. 02/26: EGDdouble pigtails stents removed and performed necrosectomy procedure. 03/01: EGD with necrosectomy and double pigtails stents replaced 3/03-EGD with necrosectomy  Assessment & Plan: Acute pancreatitis with necrosis and infected pseudocyst with staph aureus -Cystogastrostomy tube, EGDs and necrosectomy as above -Vancomycin> doxycycline + Flagyl -Continue TPN -PPI, pain control, antiemetics -Eventually discharged home with TPN, hopefully later this week  Uncontrolled DM-2 with DKA and hyperglycemia: A1c 13.2%.  DKA resolved. Recent Labs    06/16/19 2009 06/17/19 0609 06/17/19 1100  GLUCAP 291* 261* 189*  -Continue current regimen insulin.  Further adjustment per pharmacy with TPN  Essential hypertension: Normotensive -Continue lisinopril, amlodipine and bumetanide -Discontinue HCTZ in the setting of hyponatremia  Hypokalemia/hyponatremia/hypomagnesemia: -Pharmacy replacing with TPN -Discontinue HCTZ  Severe protein calorie malnutrition Nutrition Problem: Severe Malnutrition Etiology: acute illness(pancreatitis)  Signs/Symptoms: energy intake < or equal to 50% for > or equal to 5 days, moderate fat depletion, mild fat depletion, moderate muscle depletion, mild muscle depletion  Interventions: Tube feeding   DVT prophylaxis: Subcu Lovenox Code Status: Full code Family Communication:  Patient and/or RN. Available if any question.  Discharge barrier: Acute pancreatitis requiring surgical  interventions by gastroenterology Patient is from: Home Final disposition: Likely home  Consultants: Gastroenterology   Microbiology summarized: 1/26-COVID-19 negative 1/27-MRSA PCR negative 2/8-C. difficile negative 2/11-fluid culture with staph aureus resistant to penicillin  Sch Meds:  Scheduled Meds: . [MAR Hold] sodium chloride   Intravenous Once  . [MAR Hold] allopurinol  300 mg Oral Daily  . [MAR Hold] amLODipine  10 mg Oral Daily  . [MAR Hold] bumetanide  0.5 mg Oral Daily  . [MAR Hold] chlorhexidine  15 mL Mouth Rinse BID  . [MAR Hold] Chlorhexidine Gluconate Cloth  6 each Topical Daily  . [MAR Hold] diphenoxylate-atropine  2 tablet Oral BID  . [MAR Hold] doxycycline  100 mg Oral Q12H  . [MAR Hold] enoxaparin (LOVENOX) injection  40 mg Subcutaneous Q24H  . [MAR Hold] feeding supplement (ENSURE ENLIVE)  237 mL Oral TID BM  . [MAR Hold] feeding supplement (PRO-STAT SUGAR FREE 64)  30 mL Oral TID  . [MAR Hold] hydrochlorothiazide  12.5 mg Oral Daily  . [MAR Hold] insulin aspart  0-20 Units Subcutaneous Q4H while awake  . [MAR Hold] insulin aspart  0-5 Units Subcutaneous QHS  . [MAR Hold] lipase/protease/amylase  36,000 Units Oral TID AC  . [MAR Hold] lisinopril  20 mg Oral Daily  . [MAR Hold] metroNIDAZOLE  500 mg Oral Q8H  . [MAR Hold] morphine  30 mg Oral Q12H  . [MAR Hold] multivitamin with minerals  1 tablet Oral Daily  . [MAR Hold] pantoprazole  40 mg Oral Daily   Continuous Infusions: . sodium chloride    . lactated ringers 100 mL/hr at 06/17/19 1405  . [MAR Hold] magnesium sulfate bolus IVPB    . TPN ADULT (ION) 80 mL/hr at 06/16/19 1722  . [MAR Hold] TPN CYCLIC-ADULT (ION)     PRN Meds:.[MAR Hold] acetaminophen, [MAR Hold] dextrose, [MAR Hold]  HYDROmorphone (DILAUDID) injection, [MAR Hold] lidocaine, lidocaine-EPINEPHrine, [MAR Hold] Muscle Rub, [MAR Hold] ondansetron (ZOFRAN) IV, [MAR Hold] oxyCODONE, [MAR Hold] phenol, [MAR Hold] simethicone, [MAR Hold]  sodium chloride flush  Antimicrobials: Anti-infectives (From admission, onward)   Start     Dose/Rate Route Frequency Ordered Stop   06/10/19 1000  [MAR Hold]  doxycycline (VIBRA-TABS) tablet 100 mg     (MAR Hold since Wed 06/17/2019 at 1221.Hold Reason: Transfer to a Procedural area.)   100 mg Oral Every 12 hours 06/09/19 0743     06/09/19 1400  [MAR Hold]  metroNIDAZOLE (FLAGYL) tablet 500 mg     (MAR Hold since Wed 06/17/2019 at 1221.Hold Reason: Transfer to a Procedural area.)   500 mg Oral Every 8 hours 06/09/19 0743     06/05/19 1600  vancomycin (VANCOREADY) IVPB 1250 mg/250 mL  Status:  Discontinued     1,250 mg 166.7 mL/hr over 90 Minutes Intravenous Every 12 hours 06/05/19 1509 06/09/19 0743   06/02/19 1300  metroNIDAZOLE (FLAGYL) IVPB 500 mg  Status:  Discontinued     500 mg 100 mL/hr over 60 Minutes Intravenous Every 8 hours 06/02/19 1105 06/09/19 0743   05/30/19 1700  vancomycin (VANCOREADY) IVPB 1750 mg/350 mL  Status:  Discontinued     1,750 mg 175 mL/hr over 120 Minutes Intravenous Every 12 hours 05/29/19 1533 05/29/19 2206   05/30/19 1100  vancomycin (VANCOREADY) IVPB 1750 mg/350 mL  Status:  Discontinued     1,750 mg 175 mL/hr over 120 Minutes Intravenous Every 12 hours 05/29/19 2206  06/05/19 1509   05/29/19 1545  vancomycin (VANCOCIN) 2,500 mg in sodium chloride 0.9 % 500 mL IVPB     2,500 mg 250 mL/hr over 120 Minutes Intravenous  Once 05/29/19 1533 05/30/19 0014   05/25/19 1500  ciprofloxacin (CIPRO) IVPB 400 mg  Status:  Discontinued     400 mg 200 mL/hr over 60 Minutes Intravenous Every 12 hours 05/25/19 1408 06/02/19 1105   05/25/19 1500  metroNIDAZOLE (FLAGYL) IVPB 500 mg  Status:  Discontinued     500 mg 100 mL/hr over 60 Minutes Intravenous Every 8 hours 05/25/19 1408 06/01/19 1115       I have personally reviewed the following labs and images: CBC: Recent Labs  Lab 06/11/19 0950 06/12/19 0352 06/15/19 0403  WBC 5.2 6.0 5.3  NEUTROABS 3.6 3.7 3.1  HGB  8.0* 7.8* 7.5*  HCT 27.1* 25.5* 24.8*  MCV 85.8 84.4 84.4  PLT 295 250 217   BMP &GFR Recent Labs  Lab 06/11/19 0427 06/11/19 0950 06/12/19 0352 06/13/19 0415 06/15/19 0403 06/17/19 0226  NA  --  135 129* 133* 131* 129*  K  --  3.9 3.7 3.7 3.9 3.8  CL  --  103 98 100 99 95*  CO2  --  25 24 26 25 24   GLUCOSE  --  201* 254* 290* 164* 282*  BUN  --  6 7 8 10 16   CREATININE  --  0.76 0.68 0.62 0.53* 0.62  CALCIUM  --  8.1* 7.9* 7.9* 8.1* 8.3*  MG 1.7  --  1.7 1.7 1.6* 1.6*  PHOS  --   --  3.3  --  3.1 3.2   Estimated Creatinine Clearance: 150.2 mL/min (by C-G formula based on SCr of 0.62 mg/dL). Liver & Pancreas: Recent Labs  Lab 06/12/19 0352 06/15/19 0403  AST 11* 9*  ALT 8 8  ALKPHOS 61 57  BILITOT 0.2* 0.3  PROT 6.9 7.1  ALBUMIN 1.6* 1.6*   No results for input(s): LIPASE, AMYLASE in the last 168 hours. No results for input(s): AMMONIA in the last 168 hours. Diabetic: No results for input(s): HGBA1C in the last 72 hours. Recent Labs  Lab 06/16/19 1132 06/16/19 1607 06/16/19 2009 06/17/19 0609 06/17/19 1100  GLUCAP 362* 366* 291* 261* 189*   Cardiac Enzymes: No results for input(s): CKTOTAL, CKMB, CKMBINDEX, TROPONINI in the last 168 hours. No results for input(s): PROBNP in the last 8760 hours. Coagulation Profile: No results for input(s): INR, PROTIME in the last 168 hours. Thyroid Function Tests: No results for input(s): TSH, T4TOTAL, FREET4, T3FREE, THYROIDAB in the last 72 hours. Lipid Profile: Recent Labs    06/15/19 0404  TRIG 95   Anemia Panel: No results for input(s): VITAMINB12, FOLATE, FERRITIN, TIBC, IRON, RETICCTPCT in the last 72 hours. Urine analysis:    Component Value Date/Time   COLORURINE YELLOW 04/20/2019 1708   APPEARANCEUR CLEAR 04/20/2019 1708   LABSPEC 1.022 04/20/2019 1708   PHURINE 5.0 04/20/2019 1708   GLUCOSEU >=500 (A) 04/20/2019 1708   HGBUR SMALL (A) 04/20/2019 1708   BILIRUBINUR NEGATIVE 04/20/2019 1708    KETONESUR 5 (A) 04/20/2019 1708   PROTEINUR NEGATIVE 04/20/2019 1708   UROBILINOGEN 0.2 09/12/2018 0949   NITRITE NEGATIVE 04/20/2019 1708   LEUKOCYTESUR NEGATIVE 04/20/2019 1708   Sepsis Labs: Invalid input(s): PROCALCITONIN, [place]  Microbiology: No results found for this or any previous visit (from the past 240 hour(s)).  Radiology Studies: No results found.    Biran Mayberry T. Soquel  If 7PM-7AM, please contact night-coverage [email or web address] Password [facility] 06/17/2019, 4:30 PM

## 2019-06-17 NOTE — Anesthesia Postprocedure Evaluation (Signed)
Anesthesia Post Note  Patient: Caleb Chambers  Procedure(s) Performed: ESOPHAGOGASTRODUODENOSCOPY (EGD) with NECROSECTOMY (N/A ) ENDOROTOR (N/A ) STENT REMOVAL PANCREATIC STENT PLACEMENT     Patient location during evaluation: Endoscopy Anesthesia Type: General Level of consciousness: awake and alert and oriented Pain management: pain level controlled Vital Signs Assessment: post-procedure vital signs reviewed and stable Respiratory status: spontaneous breathing, nonlabored ventilation, respiratory function stable and patient connected to nasal cannula oxygen Cardiovascular status: blood pressure returned to baseline and stable Postop Assessment: no apparent nausea or vomiting Anesthetic complications: no    Last Vitals:  Vitals:   06/17/19 1702 06/17/19 1711  BP:  127/67  Pulse: (!) 105 (!) 101  Resp: (!) 32 (!) 26  Temp:    SpO2: 97% 95%    Last Pain:  Vitals:   06/17/19 1711  TempSrc:   PainSc: 0-No pain                 Arnette Driggs A.

## 2019-06-17 NOTE — Interval H&P Note (Signed)
History and Physical Interval Note:  06/17/2019 12:40 PM  Caleb Chambers  has presented today for surgery, with the diagnosis of Pancreatic Necrosis.  The various methods of treatment have been discussed with the patient and family. After consideration of risks, benefits and other options for treatment, the patient has consented to  Procedure(s): ESOPHAGOGASTRODUODENOSCOPY (EGD) with NECROSECTOMY (N/A) ENDOROTOR (N/A) as a surgical intervention.  The patient's history has been reviewed, patient examined, no change in status, stable for surgery.  I have reviewed the patient's chart and labs.  Questions were answered to the patient's satisfaction.     [employer]

## 2019-06-17 NOTE — Anesthesia Preprocedure Evaluation (Addendum)
Anesthesia Evaluation  Patient identified by MRN, date of birth, ID band Patient awake    Reviewed: Allergy & Precautions, NPO status , Patient's Chart, lab work & pertinent test results  Airway Mallampati: II  TM Distance: >3 FB Neck ROM: Full    Dental  (+) Chipped, Dental Advisory Given,    Pulmonary neg pulmonary ROS,    Pulmonary exam normal breath sounds clear to auscultation       Cardiovascular hypertension, Pt. on medications negative cardio ROS Normal cardiovascular exam Rhythm:Regular Rate:Normal     Neuro/Psych negative neurological ROS  negative psych ROS   GI/Hepatic Neg liver ROS, GERD  Medicated and Controlled,Acute pancreatitis with necrosis   Endo/Other  diabetes, Poorly Controlled, Type 2, Oral Hypoglycemic Agents, Insulin DependentMorbid obesity  Renal/GU Renal disease  negative genitourinary   Musculoskeletal negative musculoskeletal ROS (+)   Abdominal (+) + obese,   Peds  Hematology negative hematology ROS (+)   Anesthesia Other Findings Pancreatic necrosis  Reproductive/Obstetrics                             Anesthesia Physical  Anesthesia Plan  ASA: III  Anesthesia Plan: General   Post-op Pain Management:    Induction: Intravenous  PONV Risk Score and Plan: 3 and Midazolam, Dexamethasone and Ondansetron  Airway Management Planned: Oral ETT  Additional Equipment:   Intra-op Plan:   Post-operative Plan: Extubation in OR  Informed Consent: I have reviewed the patients History and Physical, chart, labs and discussed the procedure including the risks, benefits and alternatives for the proposed anesthesia with the patient or authorized representative who has indicated his/her understanding and acceptance.     Dental advisory given  Plan Discussed with: CRNA, Anesthesiologist and Surgeon  Anesthesia Plan Comments:        Anesthesia Quick  Evaluation

## 2019-06-17 NOTE — Anesthesia Procedure Notes (Signed)
Procedure Name: Intubation Date/Time: 06/17/2019 1:38 PM Performed by: Rosiland Oz, CRNA Pre-anesthesia Checklist: Patient identified, Emergency Drugs available, Suction available, Patient being monitored and Timeout performed Patient Re-evaluated:Patient Re-evaluated prior to induction Oxygen Delivery Method: Circle system utilized Preoxygenation: Pre-oxygenation with 100% oxygen Induction Type: IV induction Ventilation: Mask ventilation without difficulty and Oral airway inserted - appropriate to patient size Laryngoscope Size: Hyacinth Meeker and 2 Grade View: Grade I Tube type: Oral Tube size: 7.0 mm Number of attempts: 1 Airway Equipment and Method: Stylet Placement Confirmation: ETT inserted through vocal cords under direct vision,  positive ETCO2 and breath sounds checked- equal and bilateral Secured at: 21 cm Tube secured with: Tape Dental Injury: Teeth and Oropharynx as per pre-operative assessment

## 2019-06-17 NOTE — Plan of Care (Signed)
  Problem: Education: Goal: Ability to describe self-care measures that may prevent or decrease complications (Diabetes Survival Skills Education) will improve Outcome: Progressing Goal: Individualized Educational Video(s) Outcome: Progressing   Problem: Cardiac: Goal: Ability to maintain an adequate cardiac output will improve Outcome: Progressing   Problem: Health Behavior/Discharge Planning: Goal: Ability to identify and utilize available resources and services will improve Outcome: Progressing Goal: Ability to manage health-related needs will improve Outcome: Progressing   Problem: Fluid Volume: Goal: Ability to achieve a balanced intake and output will improve Outcome: Progressing   Problem: Metabolic: Goal: Ability to maintain appropriate glucose levels will improve Outcome: Progressing   Problem: Nutritional: Goal: Maintenance of adequate nutrition will improve Outcome: Progressing Goal: Maintenance of adequate weight for body size and type will improve Outcome: Progressing   Problem: Respiratory: Goal: Will regain and/or maintain adequate ventilation Outcome: Progressing   Problem: Urinary Elimination: Goal: Ability to achieve and maintain adequate renal perfusion and functioning will improve Outcome: Progressing   Problem: Education: Goal: Knowledge of General Education information will improve Description: Including pain rating scale, medication(s)/side effects and non-pharmacologic comfort measures Outcome: Progressing   Problem: Health Behavior/Discharge Planning: Goal: Ability to manage health-related needs will improve Outcome: Progressing   Problem: Clinical Measurements: Goal: Ability to maintain clinical measurements within normal limits will improve Outcome: Progressing Goal: Will remain free from infection Outcome: Progressing Goal: Diagnostic test results will improve Outcome: Progressing Goal: Respiratory complications will improve Outcome:  Progressing Goal: Cardiovascular complication will be avoided Outcome: Progressing   Problem: Activity: Goal: Risk for activity intolerance will decrease Outcome: Progressing   Problem: Nutrition: Goal: Adequate nutrition will be maintained Outcome: Progressing   Problem: Coping: Goal: Level of anxiety will decrease Outcome: Progressing   Problem: Elimination: Goal: Will not experience complications related to bowel motility Outcome: Progressing Goal: Will not experience complications related to urinary retention Outcome: Progressing   Problem: Pain Managment: Goal: General experience of comfort will improve Outcome: Progressing   Problem: Safety: Goal: Ability to remain free from injury will improve Outcome: Progressing   Problem: Skin Integrity: Goal: Risk for impaired skin integrity will decrease Outcome: Progressing   

## 2019-06-17 NOTE — Op Note (Signed)
[facility] Patient Name: Caleb Chambers Procedure Date : 06/17/2019 MRN: [id number] Attending MD: Justice Britain , MD Date of Birth: 02-14-1966 CSN: [id number] Age: 54 Admit Type: Inpatient Procedure:                Upper GI endoscopy Indications:              Pancreatic necrosis Providers:                Justice Britain, MD, Jeanella Cara, RN,                            Elspeth Cho Tech., Technician, Lance Coon,                            CRNA Referring MD:             Carol Ada, MD, [facility] Medicines:                General Anesthesia, Cipro 657 mg IV Complications:            No immediate complications. Estimated Blood Loss:     Estimated blood loss was minimal. Procedure:                Pre-Anesthesia Assessment:                           - Prior to the procedure, a History and Physical                            was performed, and patient medications and                            allergies were reviewed. The patient's tolerance of                            previous anesthesia was also reviewed. The risks                            and benefits of the procedure and the sedation                            options and risks were discussed with the patient.                            All questions were answered, and informed consent                            was obtained. Prior Anticoagulants: The patient has                            taken no previous anticoagulant or antiplatelet                            agents. ASA Grade Assessment: III - A patient with  severe systemic disease. After reviewing the risks                            and benefits, the patient was deemed in                            satisfactory condition to undergo the procedure.                           After obtaining informed consent, the endoscope was                            passed under direct vision. Throughout the                   procedure, the patient's blood pressure, pulse, and                            oxygen saturations were monitored continuously. The                            [id number] ([id number] Olympus therapeutic                            gastroscope was introduced through the mouth, and                            advanced to the second part of duodenum. The upper                            GI endoscopy was accomplished without difficulty.                            The patient tolerated the procedure. Scope In: Scope Out: Findings:      No gross lesions were noted in the entire esophagus. Previous       esophagitis has healed.      A previously placed AXIOS cystgastrostomy stent was found on the       posterior wall of the stomach with 2 double pigtails present. There was       some evidence of necrosis/pus exuding from the cavity. The two double       pigtail stents were removed from the cystgastrostomy with a snare. The       cyst was entered and partially filled with fluid and black necrotic       tissue that was pasty and adherent to the cyst wall. We proceeded with       nearly 3 hours of necrosectomy, finding further cavities within the       cyst/walled off necrosis that required further debridement. Necrosectomy       was performed with a Raptor grasping device, snare, Talon grasper,       Endorotor Powered Endoscopic Debrider, and a Cap requiring numerous       intubations of the cyst. At the conclusion of the procedure, very little       necrotic tissue and a large amount of pink, viable tissue was found       within  the pseudocyst on direct vision. The AXIOS stent was removed. Two       double pigtal stents (10 French x 5 cm & 7 French x 5 cm) were inserted       across the cystgastrostomy tract.      Patchy mildly erythematous mucosa without bleeding was found in the       entire examined stomach.      No gross lesions were noted in the duodenal bulb.      A mild  extrinsic deformity was found in the second portion of the       duodenum of unclear significance. Impression:               - No gross lesions in esophagus.                           - Pre-existing AXIOS cystgastrostomy stent and                            double pigtails present. Double pigtails removed.                            Pancreatic Necrosectomy performed for nearly 3                            hours. AXIOS removed. Double pigtails replaced.                           - Erythematous mucosa in the stomach.                           - No gross lesions in the duodenal bulb. Duodenal                            deformity in second portion. Recommendation:           - The patient will be observed post-procedure,                            until all discharge criteria are met.                           - Return patient to hospital ward for ongoing care.                           - Observe patient's clinical course.                           - Continue Antibiotics until follow up CT                            Abdomen/Pelvis with IV contrast (recommend in                            2-weeks from today).                           - Resume  previous diet.                           - The findings and recommendations were discussed                            with the patient.                           - The findings and recommendations were discussed                            with the patient's family.                           - The findings and recommendations were discussed                            with the referring physician. Procedure Code(s):        --- Professional ---                           [id number], Esophagogastroduodenoscopy, flexible,                            transoral; diagnostic, including collection of                            specimen(s) by brushing or washing, when performed                            (separate procedure)                           [phone], Unlisted  procedure, pancreas Diagnosis Code(s):        --- Professional ---                           Z97.8, Presence of other specified devices                           K31.89, Other diseases of stomach and duodenum                           K86.89, Other specified diseases of pancreas CPT copyright 2019 [employer]. All rights reserved. The codes documented in this report are preliminary and upon coder review may  be revised to meet current compliance requirements. Justice Britain, MD 06/17/2019 5:07:47 PM Number of Addenda: 0

## 2019-06-17 NOTE — Progress Notes (Signed)
PHARMACY - TOTAL PARENTERAL NUTRITION CONSULT NOTE  Indication:  Infected pancreatic pseudocyst  Patient Measurements: Height: 5\' 11"  (180.3 cm) Weight: 292 lb 8.8 oz (132.7 kg)(Scale B) IBW/kg (Calculated) : 75.3 TPN AdjBW (KG): 91.7 Body mass index is 40.8 kg/m.  Assessment:  42 YOM presented on 05/12/19 with DKA.  Patient was recently discharged after treatment for DKA and acute pancreatitis.  CT showed worsening pancreatitis and the development of a fluid collection.  Patient was started on tube feeding on 1/28 and tolerated it well through 2/10.  A clear liquid diet was initiated on 05/22/19 but intake was minimal, and was off and on a clear diet until 2/15.  EGD on 2/11 showed esophagitis/duodenitis and patient underwent pancreatic and biliary stent placements on 2/12.  He is s/p necrosectomy for pancreatic pseudocyst/necrosis on 2/15 and again on 2/17.  Feeding tube placement delayed due to frequent procedures.  Patient was advanced to a cardiac diet on 2/15 and intake has been inadequate.  In setting of severe malutrition, Pharmacy consulted to manage TPN.  Glucose / Insulin: hx IDDM admitted with DKA - A1c 13.2%.  CBGs remain elevated and difficult to control, 261-372 requiring 54 units rSSI/24hrs *But patient did receive Dexamethasone 10mg  on 3/1 PM contributing to current elevations.  Currently 75 units of insulin in TPN (39 units/L) with room to increase. Patient was given Levemir 10 units yesterday afternoon ->> Recommend to avoid further long-acting insulin as will begin cycling TPN and this can cause lows when TPN is off.  (*was on Lantus 45/20 BID and significant amount of SSI while on goal rate TF/oral diet)  Electrolytes: Labs 3/3: Na 129/Cl 95 (trend down), K 3.8 (on Bumex), Mag 1.6 (will replace, on Bumex), Phos 3.2, CoCa 10.2 Renal: SCr 0.62 (stable), BUN WNL. LFTs / TGs: LFTs / tbili / TG within normal limits Prealbumin / albumin: Prealbumin 8.3>>7.5, albumin low at  1.6 Intake / Output; MIVF: UOP 0.7 ml/kg/hr; LBM 2/25 x2 GI Imaging: none since TPN consult Surgeries / Procedures:  2/19 necrosectomy, no gross lesion in esophagus, erythematous mucosa 2/22 necrosectomy 2/26 repeat necrosectomy 3/1 repeat necrosectomy  3/3 repeat necrosectomy (planned)  Central access: PICC placed 06/05/19 TPN start date: 06/05/19  Nutritional Goals (per RD rec on 2/25): 2300-2500 kCal, 130-145g protein, >2L fluid per day  Current Nutrition:  Heart diet - ongoing calorie count (see specifics in Nutritional Management - only taking in ~15-20% of total needs po) Ensure Enlive - charted 2 yesterday Prostat - refusing TPN   Plan:   Cycle concentrated TPN 1920 mL over 18 hours: Infuse at 56 mL/hr for 1 hour then increase to 113 mL/hr for 16 hours then decrease back to 56 mL/hr for 1 hour and stop.  This TPN provides 142g AA, 326g CHO and 72g ILE for a total of 2398 kCal, meeting ~100% of patient needs.  Electrolytes in TPN: increase Na (154 mEq/day and 80 mEq/L); continue increased K (115 mEq/day) and increased Mag (33mEq/day) and decreased Phos. Cl:Ac 2:1 for now Continue daily PO multivitamin (no multivitamin and trace elements in TPN)  Continue resistant SSI to Q4H +  increase regular insulin in TPN bag to 90 units (47 units/L) *Recommend against long-acting insulin due to the dangerous risk of lows if TPN were to stop for any reason and also less risk of dropping low when off TPN as transition to cyclic TPN for home use. The spike in CBGs currently is directly related to Dexamethasone given on 3/1 PM. *For  [facility], when this patient is weaned from TPN to home diet, will need insulin transition back to subcutaneous insulin as well.   F/U labs, CBGs, ongoing calorie count results versus ability to place feeding tube, po intake and discharge nutrition plans >> Current plan is to discharge home with short term TPN. Will start cycling TPN.   Give Magnesium 4g IV  x1 today.   Sloan Leiter, PharmD, BCPS, BCCCP Clinical Pharmacist Clinical phone 06/17/2019 until [phone] Please refer to [facility] for [place] numbers 06/17/2019, 7:47 AM

## 2019-06-18 LAB — CBC
HCT: 27.2 % — ABNORMAL LOW (ref 39.0–52.0)
Hemoglobin: 8.3 g/dL — ABNORMAL LOW (ref 13.0–17.0)
MCH: 26 pg (ref 26.0–34.0)
MCHC: 30.5 g/dL (ref 30.0–36.0)
MCV: 85.3 fL (ref 80.0–100.0)
Platelets: 218 10*3/uL (ref 150–400)
RBC: 3.19 MIL/uL — ABNORMAL LOW (ref 4.22–5.81)
RDW: 17.9 % — ABNORMAL HIGH (ref 11.5–15.5)
WBC: 4.5 10*3/uL (ref 4.0–10.5)
nRBC: 0.4 % — ABNORMAL HIGH (ref 0.0–0.2)

## 2019-06-18 LAB — GLUCOSE, CAPILLARY
Glucose-Capillary: 255 mg/dL — ABNORMAL HIGH (ref 70–99)
Glucose-Capillary: 322 mg/dL — ABNORMAL HIGH (ref 70–99)
Glucose-Capillary: 364 mg/dL — ABNORMAL HIGH (ref 70–99)
Glucose-Capillary: 274 mg/dL — ABNORMAL HIGH (ref 70–99)
Glucose-Capillary: 264 mg/dL — ABNORMAL HIGH (ref 70–99)
Glucose-Capillary: 244 mg/dL — ABNORMAL HIGH (ref 70–99)
Glucose-Capillary: 204 mg/dL — ABNORMAL HIGH (ref 70–99)

## 2019-06-18 LAB — COMPREHENSIVE METABOLIC PANEL
ALT: 11 U/L (ref 0–44)
AST: 12 U/L — ABNORMAL LOW (ref 15–41)
Albumin: 1.9 g/dL — ABNORMAL LOW (ref 3.5–5.0)
Alkaline Phosphatase: 78 U/L (ref 38–126)
Anion gap: 10 (ref 5–15)
BUN: 19 mg/dL (ref 6–20)
CO2: 23 mmol/L (ref 22–32)
Calcium: 8.4 mg/dL — ABNORMAL LOW (ref 8.9–10.3)
Chloride: 98 mmol/L (ref 98–111)
Creatinine, Ser: 0.65 mg/dL (ref 0.61–1.24)
GFR calc Af Amer: >60 mL/min (ref >60)
GFR calc non Af Amer: >60 mL/min (ref >60)
Glucose, Bld: 350 mg/dL — ABNORMAL HIGH (ref 70–99)
Potassium: 4.4 mmol/L (ref 3.5–5.1)
Sodium: 131 mmol/L — ABNORMAL LOW (ref 135–145)
Total Bilirubin: 0.4 mg/dL (ref 0.3–1.2)
Total Protein: 7.4 g/dL (ref 6.5–8.1)

## 2019-06-18 LAB — PHOSPHORUS: Phosphorus: 3 mg/dL (ref 2.5–4.6)

## 2019-06-18 LAB — MAGNESIUM: Magnesium: 1.8 mg/dL (ref 1.7–2.4)

## 2019-06-18 MED ORDER — INSULIN ASPART 100 UNIT/ML ~~LOC~~ SOLN
0.0000 [IU] | SUBCUTANEOUS | Status: DC
  Administered 2019-06-18: 11 [IU] via SUBCUTANEOUS
  Administered 2019-06-18: 7 [IU] via SUBCUTANEOUS
  Administered 2019-06-18: 11 [IU] via SUBCUTANEOUS
  Administered 2019-06-19 (×2): 7 [IU] via SUBCUTANEOUS
  Administered 2019-06-19 (×3): 4 [IU] via SUBCUTANEOUS
  Administered 2019-06-20 (×2): 3 [IU] via SUBCUTANEOUS

## 2019-06-18 MED ORDER — STERILE WATER FOR INJECTION IV SOLN
INTRAVENOUS | Status: AC
  Filled 2019-06-18: qty 473.6

## 2019-06-18 MED ORDER — SUMATRIPTAN SUCCINATE 50 MG PO TABS
50.0000 mg | ORAL_TABLET | Freq: Once | ORAL | Status: AC
  Administered 2019-06-18: 50 mg via ORAL
  Filled 2019-06-18: qty 1

## 2019-06-18 NOTE — Plan of Care (Signed)
  Problem: Health Behavior/Discharge Planning: Goal: Ability to manage health-related needs will improve Outcome: Progressing   Problem: Fluid Volume: Goal: Ability to achieve a balanced intake and output will improve Outcome: Progressing   Problem: Metabolic: Goal: Ability to maintain appropriate glucose levels will improve Outcome: Progressing   Problem: Health Behavior/Discharge Planning: Goal: Ability to manage health-related needs will improve Outcome: Progressing   Problem: Clinical Measurements: Goal: Diagnostic test results will improve Outcome: Progressing Goal: Respiratory complications will improve Outcome: Progressing   Problem: Activity: Goal: Risk for activity intolerance will decrease Outcome: Progressing

## 2019-06-18 NOTE — Progress Notes (Signed)
Inpatient Diabetes Program Recommendations  AACE/ADA: New Consensus Statement on Inpatient Glycemic Control (2015)  Target Ranges:  Prepandial:   less than 140 mg/dL      Peak postprandial:   less than 180 mg/dL (1-2 hours)      Critically ill patients:  140 - 180 mg/dL   Lab Results  Component Value Date   GLUCAP 274 (H) 06/18/2019   HGBA1C 13.2 (H) 04/22/2019    Review of Glycemic Control Results for Caleb Chambers, Caleb Chambers (MRN [id number]) as of 06/18/2019 14:17  Ref. Range 06/17/2019 23:26 06/18/2019 03:35 06/18/2019 06:36 06/18/2019 11:23  Glucose-Capillary Latest Ref Range: 70 - 99 mg/dL 219 (H) 758 (H) 832 (H) 274 (H)   Diabetes history: Type 2 DM Outpatient Diabetes medications: Basaglar 20-23 units QD, Metformin 500 mg BID Current orders for Inpatient glycemic control: Novolog 0-20 units Q4H (while awake), Novolog 0-5 units QHS Decadron 10 mg x 1 Ensure @ 237 ml + cardiac diet  Inpatient Diabetes Program Recommendations:    Noted glucose trends increased, patient did not get correction last night and order was updated to only be administered while patient is awake.   Discussed plan of care with TPN with pharmD, dietitian and MD.   Consider:  -Novolog 0-20 units Q4H  -NPH 10 units QAM   Following discussion with MD will reevaluate on 3/4. TPN halved to 64 ml/hr start tonight.   Thanks, Lujean Rave, MSN, RNC-OB Diabetes Coordinator [phone] (8a-5p)

## 2019-06-18 NOTE — Progress Notes (Signed)
Subjective: No complaints.  Feeling well.  Objective: Vital signs in last 24 hours: Temp:  [98.1 F (36.7 C)-98.7 F (37.1 C)] 98.4 F (36.9 C) (03/04 1228) Pulse Rate:  [81-105] 81 (03/04 0937) Resp:  [16-32] 18 (03/04 0937) BP: (116-137)/(59-73) 116/66 (03/04 1228) SpO2:  [95 %-100 %] 99 % (03/04 1228) Weight:  [131.6 kg] 131.6 kg (03/04 0335) Last BM Date: 06/16/19  Intake/Output from previous day: 03/03 0701 - 03/04 0700 In: 3145.2 [P.O.:660; I.V.:2485.2] Out: 2101 [Urine:2100; Blood:1] Intake/Output this shift: Total I/O In: 270 [P.O.:270] Out: -   General appearance: alert and no distress GI: soft, non-tender; bowel sounds normal; no masses,  no organomegaly  Lab Results: Recent Labs    06/18/19 0359  WBC 4.5  HGB 8.3*  HCT 27.2*  PLT 218   BMET Recent Labs    06/17/19 0226 06/18/19 0359  NA 129* 131*  K 3.8 4.4  CL 95* 98  CO2 24 23  GLUCOSE 282* 350*  BUN 16 19  CREATININE 0.62 0.65  CALCIUM 8.3* 8.4*   LFT Recent Labs    06/18/19 0359  PROT 7.4  ALBUMIN 1.9*  AST 12*  ALT 11  ALKPHOS 78  BILITOT 0.4   PT/INR No results for input(s): LABPROT, INR in the last 72 hours. Hepatitis Panel No results for input(s): HEPBSAG, HCVAB, HEPAIGM, HEPBIGM in the last 72 hours. C-Diff No results for input(s): CDIFFTOX in the last 72 hours. Fecal Lactopherrin No results for input(s): FECLLACTOFRN in the last 72 hours.  Studies/Results: No results found.  Medications:  Scheduled: . sodium chloride   Intravenous Once  . allopurinol  300 mg Oral Daily  . amLODipine  10 mg Oral Daily  . bumetanide  0.5 mg Oral Daily  . chlorhexidine  15 mL Mouth Rinse BID  . Chlorhexidine Gluconate Cloth  6 each Topical Daily  . diphenoxylate-atropine  2 tablet Oral BID  . doxycycline  100 mg Oral Q12H  . enoxaparin (LOVENOX) injection  40 mg Subcutaneous Q24H  . feeding supplement (ENSURE ENLIVE)  237 mL Oral TID BM  . feeding supplement (PRO-STAT SUGAR FREE  64)  30 mL Oral TID  . insulin aspart  0-20 Units Subcutaneous Q4H while awake  . insulin aspart  0-5 Units Subcutaneous QHS  . lipase/protease/amylase  36,000 Units Oral TID AC  . lisinopril  20 mg Oral Daily  . metroNIDAZOLE  500 mg Oral Q8H  . morphine  30 mg Oral Q12H  . multivitamin with minerals  1 tablet Oral Daily  . pantoprazole  40 mg Oral Daily   Continuous: . magnesium sulfate bolus IVPB    . TPN CYCLIC-ADULT (ION)      Assessment/Plan: 1) WON s/p necrosectomy. 2) DM. 3) Severe malnutrition.   The patient is clinically stable.  He is almost ready for discharge.  The issues holding up the discharge are TPN and his hyperglycemia.  His albumin is slowly increasing.  The latest value is 1.9.  I am appreciative of Drs. Mansouraty's and Gonfa's intervention and arrangement for home TPN.  The plan is for the patient to be discharged on Monday.  Plan: 1) Continue TPN. 2) Continue with antibiotics.  He should be discharged with antibiotics until his CT scan, which will be in two weeks.  I will arrange for the CT scan and manage his antibiotics upon discharge.  LOS: 37 days   Maryah Marinaro D 06/18/2019, 3:03 PM

## 2019-06-18 NOTE — Progress Notes (Signed)
Nutrition Follow up   DOCUMENTATION CODES:   Severe malnutrition in context of acute illness/injury  INTERVENTION:   Continue TPN.    Ensure Enlive po TID, each supplement provides 350 kcal and 20 grams of protein  30 ml Prostat TID, each supplement provides 100 kcals and 15 grams protein.   Magic cup TID with meals, each supplement provides 290 kcal and 9 grams of protein  MVI daily   NUTRITION DIAGNOSIS:   Severe Malnutrition related to acute illness(pancreatitis) as evidenced by energy intake < or equal to 50% for > or equal to 5 days, moderate fat depletion, mild fat depletion, moderate muscle depletion, mild muscle depletion.  Ongoing  GOAL:   Patient will meet greater than or equal to 90% of their needs  Met with TPN  MONITOR:   TF tolerance, Weight trends, Labs, I & O's  REASON FOR ASSESSMENT:   Consult Assessment of nutrition requirement/status  ASSESSMENT:   Pt with a PMH significant for HTN, DM, gout, and recent admission for pancreatitis and DKA presented to ED with DKA, abdominal pain, SOB, and minimal PO intake since last admission.   1/28- s/p post pyloric small bore feeding tube placement (reads at the LOT) 2/11- s/p endoscopic cyst drainage, Cortrak removed during procedure  2/15- s/p endoscopic necrosectomy  2/17- s/p endoscopic necrosectomy  2/19- s/p endoscopic necrosectomy  2/22- s/p endoscopic necrosectomy  2/26-s/p endoscopic necrosectomy  3/1- s/p endoscopic necrosectomy  3/3- s/p endoscopic necrosectomy   Appetite continues to progress. Meal completions charted as 30-100% for his last eight meals (57% average). Supplement intake inconsistent per pt. Had long discussion regarding home TPN. Intake has progressed significantly over the last week and pt continues to feel better. Made suggestion to forgo home TPN, but pt wishes to have it.   Having issue with CBG control after steroids. Plan to cut TPN rate in half today and adjust insulin to  45 units (in bag). Goal infusion time prior to d/c is 12 hours. Long acting insulin undesirable with cyclic TPN given the time pt is off and inconsistency of PO intake. Will continue to monitor CBGs closely.   Admission weight: 133.3 kg  Current weight: 131.6 kg   I/O: -4,981 ml since 2/18 UOP: 2,100 ml x 24 hrs   Medications: lomotil, SS novolog, creon, MVI with minerals Labs: Na 131 (L) CBG 189-364  Diet Order:   Diet Order            Diet Carb Modified Fluid consistency: Thin; Room service appropriate? Yes  Diet effective now              EDUCATION NEEDS:   Not appropriate for education at this time  Skin:  Skin Assessment: Reviewed RN Assessment  Last BM:  3/2  Height:   Ht Readings from Last 1 Encounters:  06/08/19 '5\' 11"'  (1.803 m)    Weight:   Wt Readings from Last 1 Encounters:  06/18/19 131.6 kg    Ideal Body Weight:  80.9 kg  BMI:  Body mass index is 40.46 kg/m.  Estimated Nutritional Needs:   Kcal:  3094-0768  Protein:  130-145 grams  Fluid:  >2L/d  Mariana Single RD, LDN Clinical Nutrition Pager # [phone]

## 2019-06-18 NOTE — Progress Notes (Signed)
PROGRESS NOTE  Caleb Chambers ZHY:[id number] DOB: 17-Aug-1965   PCP: Patient, No Pcp Per  Patient is from: Home  DOA: 05/12/2019 LOS: 37  Brief Narrative / Interim history: 54 year old male with history of DM-2, HTN, morbid obesity, pancreatitis, noncompliance and gout presented with abdominal pain and poor oral intake and admitted with diabetic ketoacidosis, AKI and acute pancreatitis with pseudocyst.   Patient underwent multiple EGDs with cystogastrostomy tube creation and double-pigtail replacements.  Since culture positive for staph aureus.  Patient has been on vancomycin and metronidazole, and transitioned to doxycycline and metronidazole.  He has been on TPN for feeding.  Has been slowly improving.  Plan is for nephrostomy procedure today, and eventual discharge home with TPN later this week.  Subjective: No major events overnight or this morning. Feels sore in his abdomen after a procedure yesterday.  He rates his pain 7/10 this morning.  Tolerating his diet.  He says he ate about 50% or more of his meals depending on what they serve him.  Denies nausea, vomiting or diarrhea.  Denies chest pain or dyspnea.  Objective: Vitals:   06/17/19 2321 06/18/19 0333 06/18/19 0335 06/18/19 0937  BP: 124/73 118/73  120/68  Pulse: 83 85  81  Resp: 20 16  18   Temp: 98.5 F (36.9 C) 98.1 F (36.7 C)    TempSrc: Oral Oral    SpO2: 95% 100%  99%  Weight:   131.6 kg   Height:        Intake/Output Summary (Last 24 hours) at 06/18/2019 1149 Last data filed at 06/18/2019 0400 Gross per 24 hour  Intake 2845.23 ml  Output 2101 ml  Net 744.23 ml   Filed Weights   06/15/19 0318 06/16/19 0500 06/18/19 0335  Weight: 133.7 kg 132.7 kg 131.6 kg    Examination:  GENERAL: Sitting by the sink brushing his teeth. HEENT: MMM.  Vision and hearing grossly intact.  NECK: Supple.  No apparent JVD.  RESP:  No IWOB. Good air movement bilaterally. CVS:  RRR. Heart sounds normal.  ABD/GI/GU: Bowel  sounds present. Soft.  Diffuse tenderness. MSK/EXT:  Moves extremities. No apparent deformity.  1+ pedal edema bilaterally. SKIN: no apparent skin lesion or wound NEURO: Awake, alert and oriented appropriately.  No apparent focal neuro deficit. PSYCH: Calm. Normal affect.   Procedures:  2/11-EUS andAXIOScystogastrostomy tube creation with placement of double pigtail and removal of 1.2 L of fluid  2/15-removed pigtails  02/17 and 02/19-EGD 02/22-double pigtails were replaced. 02/26-EGDdouble pigtails stents removed and performed necrosectomy procedure. 03/01-EGD with necrosectomy and double pigtails stents replaced 3/03-EGD with pancreatic necrosectomy and pigtail replacement.  EGD revealed erythematous mucosa in the stomach.  Assessment & Plan: Acute pancreatitis with necrosis and infected pseudocyst with staph aureus -Cystogastrostomy tube, EGDs and necrosectomy as above -Vancomycin> doxycycline + Flagyl-until repeat CT abdomen with contrast in two weeks -Continue TPN per pharmacy. P.O. intake improving as well. Need to discuss about final TPN dose before discharge. -PPI, pain control, antiemetics  Uncontrolled DM-2 with DKA and hyperglycemia: A1c 13.2%.  DKA resolved but with significant hyperglycemia partly due to steroid Recent Labs    06/18/19 0335 06/18/19 0636 06/18/19 1123  GLUCAP 322* 364* 274*  -Continue current regimen insulin.  Further adjustment per pharmacy with TPN -Need to determine home insulin regimen with TPN  Essential hypertension: Normotensive -Continue lisinopril, amlodipine and bumetanide -Discontinued HCTZ in the setting of hyponatremia  Hypokalemia/hyponatremia/hypomagnesemia: Hyponatremia improved. -Pharmacy replacing with TPN -Discontinued HCTZ  Severe protein calorie malnutrition  Nutrition Problem: Severe Malnutrition Etiology: acute illness(pancreatitis)  Signs/Symptoms: energy intake < or equal to 50% for > or equal to 5 days, moderate  fat depletion, mild fat depletion, moderate muscle depletion, mild muscle depletion  Interventions: Tube feeding   DVT prophylaxis: Subcu Lovenox Code Status: Full code Family Communication: Patient and/or RN. Available if any question.  Discharge barrier: Hyperglycemia on TPN. CBG need to be within acceptable range before discharging on TPN. Patient is from: Home Final disposition: Likely home with TPN  Consultants: GI   Microbiology summarized: 1/26-COVID-19 negative 1/27-MRSA PCR negative 2/8-C. difficile negative 2/11-fluid culture with staph aureus resistant to penicillin  Sch Meds:  Scheduled Meds: . sodium chloride   Intravenous Once  . allopurinol  300 mg Oral Daily  . amLODipine  10 mg Oral Daily  . bumetanide  0.5 mg Oral Daily  . chlorhexidine  15 mL Mouth Rinse BID  . Chlorhexidine Gluconate Cloth  6 each Topical Daily  . diphenoxylate-atropine  2 tablet Oral BID  . doxycycline  100 mg Oral Q12H  . enoxaparin (LOVENOX) injection  40 mg Subcutaneous Q24H  . feeding supplement (ENSURE ENLIVE)  237 mL Oral TID BM  . feeding supplement (PRO-STAT SUGAR FREE 64)  30 mL Oral TID  . insulin aspart  0-20 Units Subcutaneous Q4H while awake  . insulin aspart  0-5 Units Subcutaneous QHS  . lipase/protease/amylase  36,000 Units Oral TID AC  . lisinopril  20 mg Oral Daily  . metroNIDAZOLE  500 mg Oral Q8H  . morphine  30 mg Oral Q12H  . multivitamin with minerals  1 tablet Oral Daily  . pantoprazole  40 mg Oral Daily   Continuous Infusions: . magnesium sulfate bolus IVPB    . TPN CYCLIC-ADULT (ION) 56 mL/hr at 06/18/19 1104   PRN Meds:.acetaminophen, dextrose, HYDROmorphone (DILAUDID) injection, lidocaine, lidocaine-EPINEPHrine, Muscle Rub, ondansetron (ZOFRAN) IV, oxyCODONE, phenol, simethicone, sodium chloride flush  Antimicrobials: Anti-infectives (From admission, onward)   Start     Dose/Rate Route Frequency Ordered Stop   06/10/19 1000  doxycycline (VIBRA-TABS)  tablet 100 mg     100 mg Oral Every 12 hours 06/09/19 0743     06/09/19 1400  metroNIDAZOLE (FLAGYL) tablet 500 mg     500 mg Oral Every 8 hours 06/09/19 0743     06/05/19 1600  vancomycin (VANCOREADY) IVPB 1250 mg/250 mL  Status:  Discontinued     1,250 mg 166.7 mL/hr over 90 Minutes Intravenous Every 12 hours 06/05/19 1509 06/09/19 0743   06/02/19 1300  metroNIDAZOLE (FLAGYL) IVPB 500 mg  Status:  Discontinued     500 mg 100 mL/hr over 60 Minutes Intravenous Every 8 hours 06/02/19 1105 06/09/19 0743   05/30/19 1700  vancomycin (VANCOREADY) IVPB 1750 mg/350 mL  Status:  Discontinued     1,750 mg 175 mL/hr over 120 Minutes Intravenous Every 12 hours 05/29/19 1533 05/29/19 2206   05/30/19 1100  vancomycin (VANCOREADY) IVPB 1750 mg/350 mL  Status:  Discontinued     1,750 mg 175 mL/hr over 120 Minutes Intravenous Every 12 hours 05/29/19 2206 06/05/19 1509   05/29/19 1545  vancomycin (VANCOCIN) 2,500 mg in sodium chloride 0.9 % 500 mL IVPB     2,500 mg 250 mL/hr over 120 Minutes Intravenous  Once 05/29/19 1533 05/30/19 0014   05/25/19 1500  ciprofloxacin (CIPRO) IVPB 400 mg  Status:  Discontinued     400 mg 200 mL/hr over 60 Minutes Intravenous Every 12 hours 05/25/19 1408 06/02/19 1105  05/25/19 1500  metroNIDAZOLE (FLAGYL) IVPB 500 mg  Status:  Discontinued     500 mg 100 mL/hr over 60 Minutes Intravenous Every 8 hours 05/25/19 1408 06/01/19 1115       I have personally reviewed the following labs and images: CBC: Recent Labs  Lab 06/12/19 0352 06/15/19 0403 06/18/19 0359  WBC 6.0 5.3 4.5  NEUTROABS 3.7 3.1  --   HGB 7.8* 7.5* 8.3*  HCT 25.5* 24.8* 27.2*  MCV 84.4 84.4 85.3  PLT 250 217 218   BMP &GFR Recent Labs  Lab 06/12/19 0352 06/13/19 0415 06/15/19 0403 06/17/19 0226 06/18/19 0359  NA 129* 133* 131* 129* 131*  K 3.7 3.7 3.9 3.8 4.4  CL 98 100 99 95* 98  CO2 24 26 25 24 23   GLUCOSE 254* 290* 164* 282* 350*  BUN 7 8 10 16 19   CREATININE 0.68 0.62 0.53* 0.62  0.65  CALCIUM 7.9* 7.9* 8.1* 8.3* 8.4*  MG 1.7 1.7 1.6* 1.6* 1.8  PHOS 3.3  --  3.1 3.2 3.0   Estimated Creatinine Clearance: 149.4 mL/min (by C-G formula based on SCr of 0.65 mg/dL). Liver & Pancreas: Recent Labs  Lab 06/12/19 0352 06/15/19 0403 06/18/19 0359  AST 11* 9* 12*  ALT 8 8 11   ALKPHOS 61 57 78  BILITOT 0.2* 0.3 0.4  PROT 6.9 7.1 7.4  ALBUMIN 1.6* 1.6* 1.9*   No results for input(s): LIPASE, AMYLASE in the last 168 hours. No results for input(s): AMMONIA in the last 168 hours. Diabetic: No results for input(s): HGBA1C in the last 72 hours. Recent Labs  Lab 06/17/19 2106 06/17/19 2326 06/18/19 0335 06/18/19 0636 06/18/19 1123  GLUCAP 317* 320* 322* 364* 274*   Cardiac Enzymes: No results for input(s): CKTOTAL, CKMB, CKMBINDEX, TROPONINI in the last 168 hours. No results for input(s): PROBNP in the last 8760 hours. Coagulation Profile: No results for input(s): INR, PROTIME in the last 168 hours. Thyroid Function Tests: No results for input(s): TSH, T4TOTAL, FREET4, T3FREE, THYROIDAB in the last 72 hours. Lipid Profile: No results for input(s): CHOL, HDL, LDLCALC, TRIG, CHOLHDL, LDLDIRECT in the last 72 hours. Anemia Panel: No results for input(s): VITAMINB12, FOLATE, FERRITIN, TIBC, IRON, RETICCTPCT in the last 72 hours. Urine analysis:    Component Value Date/Time   COLORURINE YELLOW 04/20/2019 1708   APPEARANCEUR CLEAR 04/20/2019 1708   LABSPEC 1.022 04/20/2019 1708   PHURINE 5.0 04/20/2019 1708   GLUCOSEU >=500 (A) 04/20/2019 1708   HGBUR SMALL (A) 04/20/2019 1708   BILIRUBINUR NEGATIVE 04/20/2019 1708   KETONESUR 5 (A) 04/20/2019 1708   PROTEINUR NEGATIVE 04/20/2019 1708   UROBILINOGEN 0.2 09/12/2018 0949   NITRITE NEGATIVE 04/20/2019 1708   LEUKOCYTESUR NEGATIVE 04/20/2019 1708   Sepsis Labs: Invalid input(s): PROCALCITONIN, [place]  Microbiology: No results found for this or any previous visit (from the past 240 hour(s)).  Radiology  Studies: No results found.    Caleb Chambers T. Caleb Chambers  If 7PM-7AM, please contact night-coverage [email or web address] Password [facility] 06/18/2019, 11:49 AM

## 2019-06-18 NOTE — Progress Notes (Signed)
PHARMACY - TOTAL PARENTERAL NUTRITION CONSULT NOTE  Indication:  Infected pancreatic pseudocyst  Patient Measurements: Height: 5\' 11"  (180.3 cm) Weight: 290 lb 2 oz (131.6 kg) IBW/kg (Calculated) : 75.3 TPN AdjBW (KG): 91.7 Body mass index is 40.46 kg/m.  Assessment:  9 YOM presented on 05/12/19 with DKA.  Patient was recently discharged after treatment for DKA and acute pancreatitis.  CT showed worsening pancreatitis and the development of a fluid collection.  Patient was started on tube feeding on 1/28 and tolerated it well through 2/10.  A clear liquid diet was initiated on 05/22/19 but intake was minimal, and was off and on a clear diet until 2/15.  EGD on 2/11 showed esophagitis/duodenitis and patient underwent pancreatic and biliary stent placements on 2/12.  He is s/p necrosectomy for pancreatic pseudocyst/necrosis on 2/15 and again on 2/17.  Feeding tube placement delayed due to frequent procedures.  Patient was advanced to a cardiac diet on 2/15 and intake has been inadequate.  In setting of severe malutrition, Pharmacy consulted to manage TPN.  Glucose / Insulin: hx IDDM admitted with DKA - A1c 13.2%.  CBGs remain elevated and difficult to control, 300s *after receiving Dexamethasone 10mg  on 3/1 and 3/3. Requiring  35 units rSSI/24hrs. Currently 90 units of insulin in TPN (47 units/L).  Recommend trying to avoid further long-acting insulin as will begin cycling TPN and this can cause lows when TPN is off, however a dose today would likely be warranted due to steroid impact.  (*was on Lantus 45/20 BID and significant amount of SSI while on goal rate TF/oral diet)  Electrolytes:  Na 131/98 (improving), K 4.4 (on Bumex), Mag 1.8 (on Bumex), Phos 3, CoCa 10.1 Renal: SCr 0.65 (stable), BUN WNL. LFTs / TGs: LFTs / tbili / TG within normal limits Prealbumin / albumin: Prealbumin 8.3>>7.5, albumin low at 1.6 Intake / Output; MIVF: UOP 0.7 ml/kg/hr; LBM 3/2 x1. I/O +753mL. Net - 5.5L.  GI  Imaging: none since TPN consult Surgeries / Procedures:  2/19 necrosectomy, no gross lesion in esophagus, erythematous mucosa 2/22 necrosectomy 2/26 repeat necrosectomy 3/1 repeat necrosectomy  3/3 repeat necrosectomy x 3hrs with necrosis/pus debrided, no gross esophagus lesions, erythrematous mucosa of stomach  Central access: PICC placed 06/05/19 TPN start date: 06/05/19  Nutritional Goals (per RD rec on 3/1): 2300-2500 kCal, 130-145g protein, >2L fluid per day  Current Nutrition:  Heart diet - ongoing calorie count (see specifics in Nutritional Management - only taking in ~15-20% of total daily needs po) Ensure Enlive - refusing Prostat - refusing TPN   Plan:   Reduce to concentrated TPN to 1/2 - Cycle 960 mL over 16 hours: Infuse at 32 mL/hr for 1 hour then increase to 64 mL/hr for 14 hours then decrease back to 32 mL/hr for 1 hour. This TPN provides 71 g AA, 163 g CHO, and 36 g ILE for a total of 1166 kcals meeting 50% of patient needs.   Electrolytes in TPN: Na 80 mEq/L (76.58mEq/bag), K 65 mEq/L (57.61 mEq/bag), Ca 5 mEq/L, Mg 20 mEq/L, and Phos 10 mmol/L. Cl:Ac 2:1 for now.   Continue resistant SSI to Q4H +  increase regular insulin in TPN bag to 45 units. *If diet remains consistent, consider transitioning to basal and meal coverage subcutaneous insulin and removing insulin from TPN bag to help better control CBGs  F/U labs, CBGs, ongoing calorie count results versus ability to place feeding tube, po intake and discharge nutrition plans >> Current plan is to discharge home  with short term TPN. Will plan work towards cycle over 12 hours for discharge.    Link Snuffer, PharmD, BCPS, BCCCP Clinical Pharmacist Clinical phone 06/18/2019 until [phone] Please refer to [facility] for [facility] numbers 06/18/2019, 8:15 AM

## 2019-06-19 DIAGNOSIS — G4700 Insomnia, unspecified: Secondary | ICD-10-CM

## 2019-06-19 LAB — CBC
HCT: 29.2 % — ABNORMAL LOW (ref 39.0–52.0)
Hemoglobin: 8.8 g/dL — ABNORMAL LOW (ref 13.0–17.0)
MCH: 25.4 pg — ABNORMAL LOW (ref 26.0–34.0)
MCHC: 30.1 g/dL (ref 30.0–36.0)
MCV: 84.1 fL (ref 80.0–100.0)
Platelets: 255 10*3/uL (ref 150–400)
RBC: 3.47 MIL/uL — ABNORMAL LOW (ref 4.22–5.81)
RDW: 17.8 % — ABNORMAL HIGH (ref 11.5–15.5)
WBC: 6.4 10*3/uL (ref 4.0–10.5)
nRBC: 0 % (ref 0.0–0.2)

## 2019-06-19 LAB — RENAL FUNCTION PANEL
Albumin: 2 g/dL — ABNORMAL LOW (ref 3.5–5.0)
Anion gap: 8 (ref 5–15)
BUN: 16 mg/dL (ref 6–20)
CO2: 25 mmol/L (ref 22–32)
Calcium: 8.5 mg/dL — ABNORMAL LOW (ref 8.9–10.3)
Chloride: 98 mmol/L (ref 98–111)
Creatinine, Ser: 0.59 mg/dL — ABNORMAL LOW (ref 0.61–1.24)
GFR calc Af Amer: >60 mL/min (ref >60)
GFR calc non Af Amer: >60 mL/min (ref >60)
Glucose, Bld: 176 mg/dL — ABNORMAL HIGH (ref 70–99)
Phosphorus: 3.5 mg/dL (ref 2.5–4.6)
Potassium: 4 mmol/L (ref 3.5–5.1)
Sodium: 131 mmol/L — ABNORMAL LOW (ref 135–145)

## 2019-06-19 LAB — GLUCOSE, CAPILLARY
Glucose-Capillary: 169 mg/dL — ABNORMAL HIGH (ref 70–99)
Glucose-Capillary: 233 mg/dL — ABNORMAL HIGH (ref 70–99)
Glucose-Capillary: 183 mg/dL — ABNORMAL HIGH (ref 70–99)
Glucose-Capillary: 208 mg/dL — ABNORMAL HIGH (ref 70–99)

## 2019-06-19 LAB — MAGNESIUM: Magnesium: 1.7 mg/dL (ref 1.7–2.4)

## 2019-06-19 MED ORDER — RAMELTEON 8 MG PO TABS
8.0000 mg | ORAL_TABLET | Freq: Every day | ORAL | Status: DC
  Administered 2019-06-19 – 2019-06-21 (×3): 8 mg via ORAL
  Filled 2019-06-19 (×3): qty 1

## 2019-06-19 MED ORDER — ZOLPIDEM TARTRATE 5 MG PO TABS
10.0000 mg | ORAL_TABLET | Freq: Every evening | ORAL | Status: DC | PRN
  Filled 2019-06-19: qty 2

## 2019-06-19 MED ORDER — ONDANSETRON HCL 4 MG/2ML IJ SOLN
4.0000 mg | Freq: Once | INTRAMUSCULAR | Status: AC
  Administered 2019-06-19: 4 mg via INTRAVENOUS
  Filled 2019-06-19: qty 2

## 2019-06-19 MED ORDER — STERILE WATER FOR INJECTION IV SOLN
INTRAVENOUS | Status: AC
  Filled 2019-06-19: qty 473.6

## 2019-06-19 MED ORDER — ALPRAZOLAM 0.25 MG PO TABS
0.2500 mg | ORAL_TABLET | Freq: Once | ORAL | Status: AC
  Administered 2019-06-19: 0.25 mg via ORAL
  Filled 2019-06-19: qty 1

## 2019-06-19 NOTE — Progress Notes (Signed)
PROGRESS NOTE  Caleb Chambers YPP:[id number] DOB: [birth year]   PCP: Caleb Chambers, No Pcp Per  Caleb Chambers is from: Home  DOA: 05/12/2019 LOS: 34  Brief Narrative / Interim history: 54 year old male with history of DM-2, HTN, morbid obesity, pancreatitis, noncompliance and gout presented with abdominal pain and poor oral intake and admitted with diabetic ketoacidosis, AKI and acute pancreatitis with pseudocyst.   Caleb Chambers underwent multiple EGDs with cystogastrostomy tube creation and double-pigtail replacements.  Fluid culture positive for staph aureus.  Caleb Chambers has been on vancomycin and metronidazole, and transitioned to doxycycline and metronidazole. Plan is to continue antibiotic until repeat CT abdomen and pelvis with contrast in about 2 weeks.  Caleb Chambers has been on TPN but oral intake improving.  His TPN has been reduced to half a total volume as of 06/18/2019.  Plan is to discharge home with half a total volume TPN, which won't happen until 06/22/2019 as [facility] won't be able to have the new TPN ready before that.  Subjective: No major events overnight or this morning.  He complains about difficulty sleeping.  Also reports nausea and indigestion but no emesis or diarrhea.  Still with some abdominal pain.  He is very anxious about his pain medications.   Objective: Vitals:   06/18/19 1650 06/18/19 1944 06/19/19 0247 06/19/19 0700  BP: 123/72 122/65  136/78  Pulse:  80    Resp:  18    Temp: 98.4 F (36.9 C) 97.6 F (36.4 C)  98.2 F (36.8 C)  TempSrc: Oral Oral  Oral  SpO2: 100% 100%    Weight:   130.7 kg   Height:        Intake/Output Summary (Last 24 hours) at 06/19/2019 1341 Last data filed at 06/19/2019 0809 Gross per 24 hour  Intake 2734.67 ml  Output 700 ml  Net 2034.67 ml   Filed Weights   06/16/19 0500 06/18/19 0335 06/19/19 0247  Weight: 132.7 kg 131.6 kg 130.7 kg    Examination:  GENERAL: No acute distress.  Appears well.  HEENT: MMM.  Vision and hearing grossly  intact.  NECK: Supple.  No apparent JVD.  RESP:  No IWOB. Good air movement bilaterally. CVS:  RRR. Heart sounds normal.  ABD/GI/GU: Bowel sounds present. Soft.  Diffuse tenderness to palpation MSK/EXT:  Moves extremities. No apparent deformity.  1+ pitting edema bilaterally SKIN: no apparent skin lesion or wound NEURO: Awake, alert and oriented appropriately.  No apparent focal neuro deficit. PSYCH: Calm. Normal affect.  Procedures:  2/11-EUS andAXIOScystogastrostomy tube creation with placement of double pigtail and removal of 1.2 L of fluid  2/15-removed pigtails  02/17 and 02/19-EGD 02/22-double pigtails were replaced. 02/26-EGDdouble pigtails stents removed and performed necrosectomy procedure. 03/01-EGD with necrosectomy and double pigtails stents replaced 3/03-EGD with pancreatic necrosectomy and pigtail replacement.  EGD revealed erythematous mucosa in the stomach.  Assessment & Plan: Acute pancreatitis with necrosis and infected pseudocyst with staph aureus -Cystogastrostomy tube, EGDs and necrosectomy as above -Vancomycin> doxycycline + Flagyl-until repeat CT abdomen with contrast in two weeks -Continue TPN per pharmacy. P.O. intake improving as well.  TPN volume reduced to half. -PPI, pain control, antiemetics  Uncontrolled DM-2 with DKA and hyperglycemia: A1c 13.2%.  DKA resolved but with significant hyperglycemia partly due to steroid-improving Recent Labs    06/18/19 2329 06/19/19 0250 06/19/19 1106  GLUCAP 204* 169* 233*  -Continue SSI-moderate every 4 hours -Pharmacy increased insulin in TPN back to 45 units  Essential hypertension: Normotensive -Continue lisinopril, amlodipine and bumetanide -Discontinued  HCTZ in the setting of hyponatremia  Hypokalemia/hyponatremia/hypomagnesemia: Hyponatremia improved. -Pharmacy replacing with TPN -Discontinued HCTZ  Insomnia -Ramelteon nightly.  -Ambien as needed if no resolution with ramelteon  Severe protein  calorie malnutrition Nutrition Problem: Severe Malnutrition Etiology: acute illness(pancreatitis)  Signs/Symptoms: energy intake < or equal to 50% for > or equal to 5 days, moderate fat depletion, mild fat depletion, moderate muscle depletion, mild muscle depletion  Interventions: Tube feeding   DVT prophylaxis: Subcu Lovenox Code Status: Full code Family Communication: Caleb Chambers and/or RN. Available if any question.  Discharge barrier: Home TPN-[facility] not able to have home TPN ready over the weekend Caleb Chambers is from: Home Final disposition: Likely home with TPN on 06/22/2019  Consultants: GI   Microbiology summarized: 1/26-COVID-19 negative 1/27-MRSA PCR negative 2/8-C. difficile negative 2/11-fluid culture with staph aureus resistant to penicillin  Sch Meds:  Scheduled Meds: . sodium chloride   Intravenous Once  . allopurinol  300 mg Oral Daily  . amLODipine  10 mg Oral Daily  . bumetanide  0.5 mg Oral Daily  . chlorhexidine  15 mL Mouth Rinse BID  . Chlorhexidine Gluconate Cloth  6 each Topical Daily  . diphenoxylate-atropine  2 tablet Oral BID  . doxycycline  100 mg Oral Q12H  . enoxaparin (LOVENOX) injection  40 mg Subcutaneous Q24H  . feeding supplement (ENSURE ENLIVE)  237 mL Oral TID BM  . feeding supplement (PRO-STAT SUGAR FREE 64)  30 mL Oral TID  . insulin aspart  0-20 Units Subcutaneous Q4H while awake  . insulin aspart  0-5 Units Subcutaneous QHS  . lipase/protease/amylase  36,000 Units Oral TID AC  . lisinopril  20 mg Oral Daily  . metroNIDAZOLE  500 mg Oral Q8H  . morphine  30 mg Oral Q12H  . multivitamin with minerals  1 tablet Oral Daily  . pantoprazole  40 mg Oral Daily   Continuous Infusions: . magnesium sulfate bolus IVPB    . TPN CYCLIC-ADULT (ION)     PRN Meds:.acetaminophen, dextrose, HYDROmorphone (DILAUDID) injection, lidocaine, lidocaine-EPINEPHrine, Muscle Rub, oxyCODONE, phenol, simethicone, sodium chloride  flush  Antimicrobials: Anti-infectives (From admission, onward)   Start     Dose/Rate Route Frequency Ordered Stop   06/10/19 1000  doxycycline (VIBRA-TABS) tablet 100 mg     100 mg Oral Every 12 hours 06/09/19 0743     06/09/19 1400  metroNIDAZOLE (FLAGYL) tablet 500 mg     500 mg Oral Every 8 hours 06/09/19 0743     06/05/19 1600  vancomycin (VANCOREADY) IVPB 1250 mg/250 mL  Status:  Discontinued     1,250 mg 166.7 mL/hr over 90 Minutes Intravenous Every 12 hours 06/05/19 1509 06/09/19 0743   06/02/19 1300  metroNIDAZOLE (FLAGYL) IVPB 500 mg  Status:  Discontinued     500 mg 100 mL/hr over 60 Minutes Intravenous Every 8 hours 06/02/19 1105 06/09/19 0743   05/30/19 1700  vancomycin (VANCOREADY) IVPB 1750 mg/350 mL  Status:  Discontinued     1,750 mg 175 mL/hr over 120 Minutes Intravenous Every 12 hours 05/29/19 1533 05/29/19 2206   05/30/19 1100  vancomycin (VANCOREADY) IVPB 1750 mg/350 mL  Status:  Discontinued     1,750 mg 175 mL/hr over 120 Minutes Intravenous Every 12 hours 05/29/19 2206 06/05/19 1509   05/29/19 1545  vancomycin (VANCOCIN) 2,500 mg in sodium chloride 0.9 % 500 mL IVPB     2,500 mg 250 mL/hr over 120 Minutes Intravenous  Once 05/29/19 1533 05/30/19 0014   05/25/19 1500  ciprofloxacin (CIPRO) IVPB 400 mg  Status:  Discontinued     400 mg 200 mL/hr over 60 Minutes Intravenous Every 12 hours 05/25/19 1408 06/02/19 1105   05/25/19 1500  metroNIDAZOLE (FLAGYL) IVPB 500 mg  Status:  Discontinued     500 mg 100 mL/hr over 60 Minutes Intravenous Every 8 hours 05/25/19 1408 06/01/19 1115       I have personally reviewed the following labs and images: CBC: Recent Labs  Lab 06/15/19 0403 06/18/19 0359 06/19/19 0314  WBC 5.3 4.5 6.4  NEUTROABS 3.1  --   --   HGB 7.5* 8.3* 8.8*  HCT 24.8* 27.2* 29.2*  MCV 84.4 85.3 84.1  PLT 217 218 255   BMP &GFR Recent Labs  Lab 06/13/19 0415 06/15/19 0403 06/17/19 0226 06/18/19 0359 06/19/19 0313  NA 133* 131* 129*  131* 131*  K 3.7 3.9 3.8 4.4 4.0  CL 100 99 95* 98 98  CO2 26 25 24 23 25   GLUCOSE 290* 164* 282* 350* 176*  BUN 8 10 16 19 16   CREATININE 0.62 0.53* 0.62 0.65 0.59*  CALCIUM 7.9* 8.1* 8.3* 8.4* 8.5*  MG 1.7 1.6* 1.6* 1.8 1.7  PHOS  --  3.1 3.2 3.0 3.5   Estimated Creatinine Clearance: 149 mL/min (A) (by C-G formula based on SCr of 0.59 mg/dL (L)). Liver & Pancreas: Recent Labs  Lab 06/15/19 0403 06/18/19 0359 06/19/19 0313  AST 9* 12*  --   ALT 8 11  --   ALKPHOS 57 78  --   BILITOT 0.3 0.4  --   PROT 7.1 7.4  --   ALBUMIN 1.6* 1.9* 2.0*   No results for input(s): LIPASE, AMYLASE in the last 168 hours. No results for input(s): AMMONIA in the last 168 hours. Diabetic: No results for input(s): HGBA1C in the last 72 hours. Recent Labs  Lab 06/18/19 1648 06/18/19 1948 06/18/19 2329 06/19/19 0250 06/19/19 1106  GLUCAP 264* 244* 204* 169* 233*   Cardiac Enzymes: No results for input(s): CKTOTAL, CKMB, CKMBINDEX, TROPONINI in the last 168 hours. No results for input(s): PROBNP in the last 8760 hours. Coagulation Profile: No results for input(s): INR, PROTIME in the last 168 hours. Thyroid Function Tests: No results for input(s): TSH, T4TOTAL, FREET4, T3FREE, THYROIDAB in the last 72 hours. Lipid Profile: No results for input(s): CHOL, HDL, LDLCALC, TRIG, CHOLHDL, LDLDIRECT in the last 72 hours. Anemia Panel: No results for input(s): VITAMINB12, FOLATE, FERRITIN, TIBC, IRON, RETICCTPCT in the last 72 hours. Urine analysis:    Component Value Date/Time   COLORURINE YELLOW 04/20/2019 1708   APPEARANCEUR CLEAR 04/20/2019 1708   LABSPEC 1.022 04/20/2019 1708   PHURINE 5.0 04/20/2019 1708   GLUCOSEU >=500 (A) 04/20/2019 1708   HGBUR SMALL (A) 04/20/2019 1708   BILIRUBINUR NEGATIVE 04/20/2019 1708   KETONESUR 5 (A) 04/20/2019 1708   PROTEINUR NEGATIVE 04/20/2019 1708   UROBILINOGEN 0.2 09/12/2018 0949   NITRITE NEGATIVE 04/20/2019 1708   LEUKOCYTESUR NEGATIVE  04/20/2019 1708   Sepsis Labs: Invalid input(s): PROCALCITONIN, LACTICIDVEN  Microbiology: No results found for this or any previous visit (from the past 240 hour(s)).  Radiology Studies: No results found.    Cailynn Bodnar T. Markham Dumlao [facility]  If 7PM-7AM, please contact night-coverage [email or web address] Password [email or web address] 06/19/2019, 1:41 PM

## 2019-06-19 NOTE — Progress Notes (Signed)
PHARMACY - TOTAL PARENTERAL NUTRITION CONSULT NOTE  Indication:  Infected pancreatic pseudocyst  Patient Measurements: Height: 5\' 11"  (180.3 cm) Weight: 288 lb 2.3 oz (130.7 kg) IBW/kg (Calculated) : 75.3 TPN AdjBW (KG): 91.7 Body mass index is 40.19 kg/m.  Assessment:  57 YOM presented on 05/12/19 with DKA.  Patient was recently discharged after treatment for DKA and acute pancreatitis.  CT showed worsening pancreatitis and the development of a fluid collection.  Patient was started on tube feeding on 1/28 and tolerated it well through 2/10.  A clear liquid diet was initiated on 05/22/19 but intake was minimal, and was off and on a clear diet until 2/15.  EGD on 2/11 showed esophagitis/duodenitis and patient underwent pancreatic and biliary stent placements on 2/12.  He is s/p necrosectomy for pancreatic pseudocyst/necrosis on 2/15 and again on 2/17.  Feeding tube placement delayed due to frequent procedures.  Patient was advanced to a cardiac diet on 2/15 and intake has been inadequate.  In setting of severe malutrition, Pharmacy consulted to manage TPN.  Glucose / Insulin: hx IDDM admitted with DKA - A1c 13.2%.  CBGs remain elevated and difficult to control, 200s *after receiving Dexamethasone 10mg  on 3/1 and 3/3. Requiring  33 units rSSI/24hrs. Currently 45 units of insulin in TPN.  Recommend trying to avoid further long-acting insulin as will begin cycling TPN and this can cause lows when TPN is off, however a dose today would likely be warranted due to steroid impact.  (*was on Lantus 45/20 BID and significant amount of SSI while on goal rate TF/oral diet)  Electrolytes:  Na 131/98 (improving), K 4 (on Bumex), Mag 1.7 (on Bumex), Phos 3.5, CoCa 10.1 Renal: SCr 0.59 (stable), BUN WNL. LFTs / TGs: LFTs / tbili / TG within normal limits Prealbumin / albumin: Prealbumin 8.3>>7.5, albumin low at 1.6 Intake / Output; MIVF: UOP 0.2 ml/kg/hr; LBM 3/2 x1. I/O +2458mL.   GI Imaging: none since TPN  consult Surgeries / Procedures:  2/19 necrosectomy, no gross lesion in esophagus, erythematous mucosa 2/22 necrosectomy 2/26 repeat necrosectomy 3/1 repeat necrosectomy  3/3 repeat necrosectomy x 3hrs with necrosis/pus debrided, no gross esophagus lesions, erythrematous mucosa of stomach  Central access: PICC placed 06/05/19 TPN start date: 06/05/19  Nutritional Goals (per RD rec on 3/1): 2300-2500 kCal, 130-145g protein, >2L fluid per day  Current Nutrition:  Heart diet - Meal completions charted as 30-100% for his last eight meals (57% average). Ensure Enlive - refusing Prostat - refusing TPN   Plan:   Continue concentrated TPN at 1/2 total volume, will advance to 14 hour infusion time - Cycle 960 mL over 14 hours: Infuse at 37 mL/hr for 1 hour then increase to 74 mL/hr for 12 hours then decrease back to 37 mL/hr for 1 hour. This TPN provides 71 g AA, 163 g CHO, and 36 g ILE for a total of 1166 kcals meeting 50% of patient needs.   Electrolytes in TPN: Na 80 mEq/L (76.68mEq/bag), K 65 mEq/L (57.61 mEq/bag), Ca 5 mEq/L, Mg 20 mEq/L, and Phos 10 mmol/L. Cl:Ac 2:1 for now.   Continue resistant SSI to Q4H +  increase regular insulin in TPN bag to 45 units. *If diet remains consistent, consider transitioning to basal and meal coverage subcutaneous insulin and removing insulin from TPN bag to help better control CBGs  F/U labs, CBGs, ongoing calorie count results versus ability to place feeding tube, po intake and discharge nutrition plans >> Current plan is to discharge home Monday with  short term TPN. Will plan work towards cycle over 12 hours for discharge.    Jeanella Cara, PharmD, [facility] Clinical Pharmacist Please see AMION for all Pharmacists' Contact Phone Numbers 06/19/2019, 8:35 AM

## 2019-06-19 NOTE — Progress Notes (Deleted)
PHARMACY - TOTAL PARENTERAL NUTRITION CONSULT NOTE  Indication:  Infected pancreatic pseudocyst  Patient Measurements: Height: 5\' 11"  (180.3 cm) Weight: 288 lb 2.3 oz (130.7 kg) IBW/kg (Calculated) : 75.3 TPN AdjBW (KG): 91.7 Body mass index is 40.19 kg/m.  Assessment:  29 YOM presented on 05/12/19 with DKA.  Patient was recently discharged after treatment for DKA and acute pancreatitis.  CT showed worsening pancreatitis and the development of a fluid collection.  Patient was started on tube feeding on 1/28 and tolerated it well through 2/10.  A clear liquid diet was initiated on 05/22/19 but intake was minimal, and was off and on a clear diet until 2/15.  EGD on 2/11 showed esophagitis/duodenitis and patient underwent pancreatic and biliary stent placements on 2/12.  He is s/p necrosectomy for pancreatic pseudocyst/necrosis on 2/15 and again on 2/17.  Feeding tube placement delayed due to frequent procedures.  Patient was advanced to a cardiac diet on 2/15 and intake has been inadequate.  In setting of severe malutrition, Pharmacy consulted to manage TPN.  Glucose / Insulin: hx IDDM admitted with DKA - A1c 13.2%.  CBGs remain elevated and difficult to control, 200s *after receiving Dexamethasone 10mg  on 3/1 and 3/3. Requiring  33 units rSSI/24hrs. Currently 45 units of insulin in TPN.  Recommend trying to avoid further long-acting insulin as will begin cycling TPN and this can cause lows when TPN is off, however a dose today would likely be warranted due to steroid impact.  (*was on Lantus 45/20 BID and significant amount of SSI while on goal rate TF/oral diet)  Electrolytes:  Na 131/98 (improving), K 4 (on Bumex), Mag 1.7 (on Bumex), Phos 3.5, CoCa 10.1 Renal: SCr 0.59 (stable), BUN WNL. LFTs / TGs: LFTs / tbili / TG within normal limits Prealbumin / albumin: Prealbumin 8.3>>7.5, albumin low at 1.6 Intake / Output; MIVF: UOP 0.2 ml/kg/hr; LBM 3/2 x1. I/O +2445mL.   GI Imaging: none since TPN  consult Surgeries / Procedures:  2/19 necrosectomy, no gross lesion in esophagus, erythematous mucosa 2/22 necrosectomy 2/26 repeat necrosectomy 3/1 repeat necrosectomy  3/3 repeat necrosectomy x 3hrs with necrosis/pus debrided, no gross esophagus lesions, erythrematous mucosa of stomach  Central access: PICC placed 06/05/19 TPN start date: 06/05/19  Nutritional Goals (per RD rec on 3/1): 2300-2500 kCal, 130-145g protein, >2L fluid per day  Current Nutrition:  Heart diet - Meal completions charted as 30-100% for his last eight meals (57% average). Ensure Enlive - refusing Prostat - refusing TPN   Plan:   Continue concentrated TPN at 1/2 total volume, will advance to 14 hour infusion time - Cycle 960 mL over 14 hours: Infuse at 32 mL/hr for 1 hour then increase to 64 mL/hr for 14 hours then decrease back to 32 mL/hr for 1 hour. This TPN provides 71 g AA, 163 g CHO, and 36 g ILE for a total of 1166 kcals meeting 50% of patient needs.   Electrolytes in TPN: Na 80 mEq/L (76.57mEq/bag), K 65 mEq/L (57.61 mEq/bag), Ca 5 mEq/L, Mg 20 mEq/L, and Phos 10 mmol/L. Cl:Ac 2:1 for now.   Continue resistant SSI to Q4H +  increase regular insulin in TPN bag to 50 units. *If diet remains consistent, consider transitioning to basal and meal coverage subcutaneous insulin and removing insulin from TPN bag to help better control CBGs  F/U labs, CBGs, ongoing calorie count results versus ability to place feeding tube, po intake and discharge nutrition plans >> Current plan is to discharge home Monday with  short term TPN. Will plan work towards cycle over 12 hours for discharge.    Alanda Slim, PharmD, [facility] Clinical Pharmacist Please see AMION for all Pharmacists' Contact Phone Numbers 06/19/2019, 8:32 AM

## 2019-06-20 LAB — RENAL FUNCTION PANEL
Albumin: 2.1 g/dL — ABNORMAL LOW (ref 3.5–5.0)
Anion gap: 9 (ref 5–15)
BUN: 16 mg/dL (ref 6–20)
CO2: 25 mmol/L (ref 22–32)
Calcium: 8.5 mg/dL — ABNORMAL LOW (ref 8.9–10.3)
Chloride: 100 mmol/L (ref 98–111)
Creatinine, Ser: 0.61 mg/dL (ref 0.61–1.24)
GFR calc Af Amer: >60 mL/min (ref >60)
GFR calc non Af Amer: >60 mL/min (ref >60)
Glucose, Bld: 137 mg/dL — ABNORMAL HIGH (ref 70–99)
Phosphorus: 4.1 mg/dL (ref 2.5–4.6)
Potassium: 4 mmol/L (ref 3.5–5.1)
Sodium: 134 mmol/L — ABNORMAL LOW (ref 135–145)

## 2019-06-20 LAB — CBC
HCT: 28.7 % — ABNORMAL LOW (ref 39.0–52.0)
Hemoglobin: 8.5 g/dL — ABNORMAL LOW (ref 13.0–17.0)
MCH: 25.1 pg — ABNORMAL LOW (ref 26.0–34.0)
MCHC: 29.6 g/dL — ABNORMAL LOW (ref 30.0–36.0)
MCV: 84.9 fL (ref 80.0–100.0)
Platelets: 254 10*3/uL (ref 150–400)
RBC: 3.38 MIL/uL — ABNORMAL LOW (ref 4.22–5.81)
RDW: 17.7 % — ABNORMAL HIGH (ref 11.5–15.5)
WBC: 7.8 10*3/uL (ref 4.0–10.5)
nRBC: 0 % (ref 0.0–0.2)

## 2019-06-20 LAB — GLUCOSE, CAPILLARY
Glucose-Capillary: 149 mg/dL — ABNORMAL HIGH (ref 70–99)
Glucose-Capillary: 166 mg/dL — ABNORMAL HIGH (ref 70–99)
Glucose-Capillary: 174 mg/dL — ABNORMAL HIGH (ref 70–99)
Glucose-Capillary: 183 mg/dL — ABNORMAL HIGH (ref 70–99)

## 2019-06-20 LAB — MAGNESIUM: Magnesium: 1.7 mg/dL (ref 1.7–2.4)

## 2019-06-20 MED ORDER — MORPHINE SULFATE ER 15 MG PO TBCR
30.0000 mg | EXTENDED_RELEASE_TABLET | Freq: Every day | ORAL | Status: DC
  Administered 2019-06-20: 30 mg via ORAL
  Filled 2019-06-20: qty 2

## 2019-06-20 MED ORDER — ONDANSETRON HCL 4 MG/2ML IJ SOLN
4.0000 mg | Freq: Four times a day (QID) | INTRAMUSCULAR | Status: DC | PRN
  Administered 2019-06-20 – 2019-06-21 (×2): 4 mg via INTRAVENOUS
  Filled 2019-06-20 (×4): qty 2

## 2019-06-20 MED ORDER — INSULIN ASPART 100 UNIT/ML ~~LOC~~ SOLN
3.0000 [IU] | Freq: Three times a day (TID) | SUBCUTANEOUS | Status: DC
  Administered 2019-06-20 – 2019-06-22 (×4): 3 [IU] via SUBCUTANEOUS

## 2019-06-20 MED ORDER — INSULIN ASPART 100 UNIT/ML ~~LOC~~ SOLN
0.0000 [IU] | Freq: Every day | SUBCUTANEOUS | Status: DC
  Administered 2019-06-21: 2 [IU] via SUBCUTANEOUS

## 2019-06-20 MED ORDER — STERILE WATER FOR INJECTION IV SOLN
INTRAVENOUS | Status: AC
  Filled 2019-06-20: qty 473.6

## 2019-06-20 MED ORDER — MORPHINE SULFATE ER 15 MG PO TBCR
15.0000 mg | EXTENDED_RELEASE_TABLET | Freq: Every morning | ORAL | Status: DC
  Administered 2019-06-21: 15 mg via ORAL
  Filled 2019-06-20: qty 1

## 2019-06-20 MED ORDER — INSULIN ASPART 100 UNIT/ML ~~LOC~~ SOLN
0.0000 [IU] | Freq: Three times a day (TID) | SUBCUTANEOUS | Status: DC
  Administered 2019-06-20 – 2019-06-21 (×3): 3 [IU] via SUBCUTANEOUS
  Administered 2019-06-21: 2 [IU] via SUBCUTANEOUS
  Administered 2019-06-21 – 2019-06-22 (×2): 3 [IU] via SUBCUTANEOUS

## 2019-06-20 MED ORDER — OXYCODONE HCL 5 MG PO TABS: 5.0000 mg | ORAL_TABLET | Freq: Four times a day (QID) | ORAL | Status: DC | PRN

## 2019-06-20 NOTE — Progress Notes (Signed)
PHARMACY - TOTAL PARENTERAL NUTRITION CONSULT NOTE  Indication:  Infected pancreatic pseudocyst  Patient Measurements: Height: 5\' 11"  (180.3 cm) Weight: 285 lb 7.9 oz (129.5 kg) IBW/kg (Calculated) : 75.3 TPN AdjBW (KG): 91.7 Body mass index is 39.82 kg/m.  Assessment:  Caleb Chambers presented on 05/12/19 with DKA.  Patient was recently discharged after treatment for DKA and acute pancreatitis.  CT showed worsening pancreatitis and the development of a fluid collection.  Patient was started on tube feeding on 1/28 and tolerated it well through 2/10.  A clear liquid diet was initiated on 05/22/19 but intake was minimal, and was off and on a clear diet until 2/15.  EGD on 2/11 showed esophagitis/duodenitis and patient underwent pancreatic and biliary stent placements on 2/12.  He is s/p necrosectomy for pancreatic pseudocyst/necrosis on 2/15 and again on 2/17.  Feeding tube placement delayed due to frequent procedures.  Patient was advanced to a cardiac diet on 2/15 and intake has been inadequate.  In setting of severe malutrition, Pharmacy consulted to manage TPN.  Glucose / Insulin: hx IDDM admitted with DKA - A1c 13.2%.  CBGs remain elevated and difficult to control, 200s *after receiving Dexamethasone 10mg  on 3/1 and 3/3. Requiring  25 units rSSI/24hrs. Currently 45 units of insulin in TPN.  Recommend trying to avoid further long-acting insulin as will begin cycling TPN and this can cause lows when TPN is off.  (*was on Lantus 45/20 BID and significant amount of SSI while on goal rate TF/oral diet)  Electrolytes:  Na 134/100 (improving), K 4 (on Bumex), Mag 1.7 (on Bumex), Phos 4.1, CoCa 10.1 Renal: SCr 0.61 (stable), BUN WNL. LFTs / TGs: LFTs / tbili / TG within normal limits Prealbumin / albumin: Prealbumin 8.3>>7.5, albumin 2.1 Intake / Output; MIVF: UOP 0.5 ml/kg/hr; LBM 3/2 x1. I/O even.   GI Imaging: none since TPN consult Surgeries / Procedures:  2/19 necrosectomy, no gross lesion in  esophagus, erythematous mucosa 2/22 necrosectomy 2/26 repeat necrosectomy 3/1 repeat necrosectomy  3/3 repeat necrosectomy x 3hrs with necrosis/pus debrided, no gross esophagus lesions, erythrematous mucosa of stomach  Central access: PICC placed 06/05/19 TPN start date: 06/05/19  Nutritional Goals (per RD rec on 3/1): 2300-2500 kCal, 130-145g protein, >2L fluid per day  Current Nutrition:  Heart diet - 100% x 2 meals Ensure Enlive - refusing Prostat - refusing 1/2 TPN   Plan:   Continue concentrated TPN at 1/2 total volume, will advance to 12 hour infusion time - Cycle 960 mL over 12 hours: Infuse at 44 mL/hr for 1 hour then increase to 87 mL/hr for 10 hours then decrease back to 44 mL/hr for 1 hour. This TPN provides 71 g AA, 163 g CHO, and 36 g ILE for a total of 1166 kcals meeting 50% of patient needs.   Electrolytes in TPN: Na 80 mEq/L (76.57mEq/bag), K 65 mEq/L (57.61 mEq/bag), Ca 5 mEq/L, Mg 20 mEq/L, and Phos 10 mmol/L. Cl:Ac 2:1 for now.   Continue resistant SSI to Q4H +  continue regular insulin in TPN bag to 45 units. *If diet remains consistent, consider transitioning to basal and meal coverage subcutaneous insulin and removing insulin from TPN bag to help better control CBGs  F/U labs, CBGs, ongoing calorie count results versus ability to place feeding tube, po intake and discharge nutrition plans >> Current plan is to discharge home Monday with short term TPN. Will cycle over 12 hours for discharge.    Alanda Slim, PharmD, [facility] Clinical Pharmacist Please see  AMION for all Pharmacists' Contact Phone Numbers 06/20/2019, 7:44 AM

## 2019-06-20 NOTE — Progress Notes (Signed)
Patient states he has a headache all day when the TNA is turned off, and then the headache goes away about an hour after the TNA is started in the evening. Pharmacist will look into this to see if it is related to TNA. RN made aware

## 2019-06-20 NOTE — Progress Notes (Deleted)
PHARMACY - TOTAL PARENTERAL NUTRITION CONSULT NOTE  Indication:  Infected pancreatic pseudocyst  Patient Measurements: Height: 5\' 11"  (180.3 cm) Weight: 285 lb 7.9 oz (129.5 kg) IBW/kg (Calculated) : 75.3 TPN AdjBW (KG): 91.7 Body mass index is 39.82 kg/m.  Assessment:  61 YOM presented on 05/12/19 with DKA.  Patient was recently discharged after treatment for DKA and acute pancreatitis.  CT showed worsening pancreatitis and the development of a fluid collection.  Patient was started on tube feeding on 1/28 and tolerated it well through 2/10.  A clear liquid diet was initiated on 05/22/19 but intake was minimal, and was off and on a clear diet until 2/15.  EGD on 2/11 showed esophagitis/duodenitis and patient underwent pancreatic and biliary stent placements on 2/12.  He is s/p necrosectomy for pancreatic pseudocyst/necrosis on 2/15 and again on 2/17.  Feeding tube placement delayed due to frequent procedures.  Patient was advanced to a cardiac diet on 2/15 and intake has been inadequate.  In setting of severe malutrition, Pharmacy consulted to manage TPN.  Glucose / Insulin: hx IDDM admitted with DKA - A1c 13.2%.  CBGs remain elevated and difficult to control, 200s *after receiving Dexamethasone 10mg  on 3/1 and 3/3. Requiring  25 units rSSI/24hrs. Currently 45 units of insulin in TPN.  Recommend trying to avoid further long-acting insulin as will begin cycling TPN and this can cause lows when TPN is off.  (*was on Lantus 45/20 BID and significant amount of SSI while on goal rate TF/oral diet)  Electrolytes:  Na 134/100 (improving), K 4 (on Bumex), Mag 1.7 (on Bumex), Phos 4.1, CoCa 10.1 Renal: SCr 0.61 (stable), BUN WNL. LFTs / TGs: LFTs / tbili / TG within normal limits Prealbumin / albumin: Prealbumin 8.3>>7.5, albumin 2.1 Intake / Output; MIVF: UOP 0.5 ml/kg/hr; LBM 3/2 x1. I/O even.   GI Imaging: none since TPN consult Surgeries / Procedures:  2/19 necrosectomy, no gross lesion in  esophagus, erythematous mucosa 2/22 necrosectomy 2/26 repeat necrosectomy 3/1 repeat necrosectomy  3/3 repeat necrosectomy x 3hrs with necrosis/pus debrided, no gross esophagus lesions, erythrematous mucosa of stomach  Central access: PICC placed 06/05/19 TPN start date: 06/05/19  Nutritional Goals (per RD rec on 3/1): 2300-2500 kCal, 130-145g protein, >2L fluid per day  Current Nutrition:  Heart diet - 100% x 2 meals Ensure Enlive - refusing Prostat - refusing 1/2 TPN   Plan:   Continue concentrated TPN at 1/2 total volume, will advance to 12 hour infusion time - Cycle 960 mL over 12 hours: Infuse at 37 mL/hr for 1 hour then increase to 74 mL/hr for 10 hours then decrease back to 37 mL/hr for 1 hour. This TPN provides 71 g AA, 163 g CHO, and 36 g ILE for a total of 1166 kcals meeting 50% of patient needs.   Electrolytes in TPN: Na 80 mEq/L (76.28mEq/bag), K 65 mEq/L (57.61 mEq/bag), Ca 5 mEq/L, Mg 20 mEq/L, and Phos 10 mmol/L. Cl:Ac 2:1 for now.   Continue resistant SSI to Q4H +  continue regular insulin in TPN bag to 45 units. *If diet remains consistent, consider transitioning to basal and meal coverage subcutaneous insulin and removing insulin from TPN bag to help better control CBGs  F/U labs, CBGs, ongoing calorie count results versus ability to place feeding tube, po intake and discharge nutrition plans >> Current plan is to discharge home Monday with short term TPN. Will plan work towards cycle over 12 hours for discharge.    4m, PharmD, [facility] Clinical  Pharmacist Please see AMION for all Pharmacists' Contact Phone Numbers 06/20/2019, 7:37 AM

## 2019-06-20 NOTE — Progress Notes (Signed)
PROGRESS NOTE  Caleb Chambers XBJ:[id number] DOB: 03/15/1966   PCP: Patient, No Pcp Per  Patient is from: Home  DOA: 05/12/2019 LOS: 39  Brief Narrative / Interim history: 54 year old male with history of DM-2, HTN, morbid obesity, pancreatitis, noncompliance and gout presented with abdominal pain and poor oral intake and admitted with diabetic ketoacidosis, AKI and acute pancreatitis with pseudocyst.   Patient underwent multiple EGDs with cystogastrostomy tube creation and double-pigtail replacements.  Fluid culture positive for staph aureus.  Patient has been on vancomycin and metronidazole, and transitioned to doxycycline and metronidazole. Plan is to continue antibiotic until repeat CT abdomen and pelvis with contrast in about 2 weeks.  Patient has been on TPN but oral intake improving.  His TPN has been reduced to half a total volume as of 06/18/2019.  Plan is to discharge home with half a total volume TPN, which won't happen until 06/22/2019 as advanced home won't be able to have the new TPN ready before that.  Subjective: No major events overnight or this morning.  No complaints.  At good sleep last night.  Pain fairly controlled.  Tolerating diet.  Objective: Vitals:   06/19/19 1924 06/20/19 0401 06/20/19 0700 06/20/19 0800  BP: 126/73  125/70   Pulse: 87     Resp: 17     Temp: 98 F (36.7 C)  98.1 F (36.7 C)   TempSrc: Oral  Oral   SpO2: 98%   98%  Weight:  129.5 kg    Height:        Intake/Output Summary (Last 24 hours) at 06/20/2019 1036 Last data filed at 06/20/2019 0800 Gross per 24 hour  Intake 1613.74 ml  Output 1600 ml  Net 13.74 ml   Filed Weights   06/18/19 0335 06/19/19 0247 06/20/19 0401  Weight: 131.6 kg 130.7 kg 129.5 kg    Examination:  GENERAL: No acute distress.  Appears well.  HEENT: MMM.  Vision and hearing grossly intact.  NECK: Supple.  No apparent JVD.  RESP:  No IWOB. Good air movement bilaterally. CVS:  RRR. Heart sounds normal.    ABD/GI/GU: Bowel sounds present. Soft.  Diffuse tenderness to palpation. MSK/EXT:  Moves extremities. No apparent deformity.  1+ pedal edema bilaterally. SKIN: no apparent skin lesion or wound NEURO: Awake, alert and oriented appropriately.  No apparent focal neuro deficit. PSYCH: Calm. Normal affect.  Procedures:  2/11-EUS andAXIOScystogastrostomy tube creation with placement of double pigtail and removal of 1.2 L of fluid  2/15-removed pigtails  02/17 and 02/19-EGD 02/22-double pigtails were replaced. 02/26-EGDdouble pigtails stents removed and performed necrosectomy procedure. 03/01-EGD with necrosectomy and double pigtails stents replaced 3/03-EGD with pancreatic necrosectomy and pigtail replacement.  EGD revealed erythematous mucosa in the stomach.  Assessment & Plan: Acute pancreatitis with necrosis and infected pseudocyst with staph aureus -Cystogastrostomy tube, EGDs and necrosectomy as above -Vancomycin> doxycycline + Flagyl-until repeat CT abdomen with contrast in two weeks -Continue TPN per pharmacy. P.O. intake improving as well.  TPN volume reduced to half. -Continue PPI and antiemetics -Discontinue IV Dilaudid-hasn't needed -Decreased MS Contin to 15 mg in the morning and 30 mg at night -Decrease oxycodone 5 mg from every 4 hours to every 6 hours as needed  Uncontrolled DM-2 with DKA and hyperglycemia: A1c 13.2%.  DKA resolved but with significant hyperglycemia partly due to steroid-improving Recent Labs    06/19/19 1641 06/19/19 1922 06/20/19 0355  GLUCAP 183* 208* 149*  -Changed SSI-moderate from q4h to ACHS that he is now taking  p.o. fairly -Added NovoLog AC 3 units -He is also getting 45 units in nocturnal TPN  Essential hypertension: Normotensive -Continue lisinopril, amlodipine and bumetanide -Discontinued HCTZ in the setting of hyponatremia  Hypokalemia/hyponatremia/hypomagnesemia: Hyponatremia improved. -Pharmacy replacing with TPN -Discontinued  HCTZ  Insomnia -Ramelteon nightly.  -Ambien as needed if no resolution with ramelteon  Severe protein calorie malnutrition Nutrition Problem: Severe Malnutrition Etiology: acute illness(pancreatitis)  Signs/Symptoms: energy intake < or equal to 50% for > or equal to 5 days, moderate fat depletion, mild fat depletion, moderate muscle depletion, mild muscle depletion  Interventions: Tube feeding   DVT prophylaxis: Subcu Lovenox Code Status: Full code Family Communication: Patient and/or RN. Available if any question.  Discharge barrier: Home TPN-[facility] not able to have home TPN ready over the weekend Patient is from: Home Final disposition: Likely home with TPN on 06/22/2019  Consultants: GI   Microbiology summarized: 1/26-COVID-19 negative 1/27-MRSA PCR negative 2/8-C. difficile negative 2/11-fluid culture with staph aureus resistant to penicillin  Sch Meds:  Scheduled Meds: . sodium chloride   Intravenous Once  . allopurinol  300 mg Oral Daily  . amLODipine  10 mg Oral Daily  . bumetanide  0.5 mg Oral Daily  . Chlorhexidine Gluconate Cloth  6 each Topical Daily  . doxycycline  100 mg Oral Q12H  . enoxaparin (LOVENOX) injection  40 mg Subcutaneous Q24H  . feeding supplement (ENSURE ENLIVE)  237 mL Oral TID BM  . insulin aspart  0-15 Units Subcutaneous TID WC  . insulin aspart  0-5 Units Subcutaneous QHS  . insulin aspart  3 Units Subcutaneous TID WC  . lipase/protease/amylase  36,000 Units Oral TID AC  . lisinopril  20 mg Oral Daily  . metroNIDAZOLE  500 mg Oral Q8H  . morphine  30 mg Oral Q12H  . multivitamin with minerals  1 tablet Oral Daily  . pantoprazole  40 mg Oral Daily  . ramelteon  8 mg Oral QHS   Continuous Infusions: . magnesium sulfate bolus IVPB    . TPN CYCLIC-ADULT (ION)     PRN Meds:.acetaminophen, dextrose, HYDROmorphone (DILAUDID) injection, lidocaine, lidocaine-EPINEPHrine, Muscle Rub, oxyCODONE, phenol, simethicone, sodium chloride  flush, zolpidem  Antimicrobials: Anti-infectives (From admission, onward)   Start     Dose/Rate Route Frequency Ordered Stop   06/10/19 1000  doxycycline (VIBRA-TABS) tablet 100 mg     100 mg Oral Every 12 hours 06/09/19 0743     06/09/19 1400  metroNIDAZOLE (FLAGYL) tablet 500 mg     500 mg Oral Every 8 hours 06/09/19 0743     06/05/19 1600  vancomycin (VANCOREADY) IVPB 1250 mg/250 mL  Status:  Discontinued     1,250 mg 166.7 mL/hr over 90 Minutes Intravenous Every 12 hours 06/05/19 1509 06/09/19 0743   06/02/19 1300  metroNIDAZOLE (FLAGYL) IVPB 500 mg  Status:  Discontinued     500 mg 100 mL/hr over 60 Minutes Intravenous Every 8 hours 06/02/19 1105 06/09/19 0743   05/30/19 1700  vancomycin (VANCOREADY) IVPB 1750 mg/350 mL  Status:  Discontinued     1,750 mg 175 mL/hr over 120 Minutes Intravenous Every 12 hours 05/29/19 1533 05/29/19 2206   05/30/19 1100  vancomycin (VANCOREADY) IVPB 1750 mg/350 mL  Status:  Discontinued     1,750 mg 175 mL/hr over 120 Minutes Intravenous Every 12 hours 05/29/19 2206 06/05/19 1509   05/29/19 1545  vancomycin (VANCOCIN) 2,500 mg in sodium chloride 0.9 % 500 mL IVPB     2,500 mg 250 mL/hr over  120 Minutes Intravenous  Once 05/29/19 1533 05/30/19 0014   05/25/19 1500  ciprofloxacin (CIPRO) IVPB 400 mg  Status:  Discontinued     400 mg 200 mL/hr over 60 Minutes Intravenous Every 12 hours 05/25/19 1408 06/02/19 1105   05/25/19 1500  metroNIDAZOLE (FLAGYL) IVPB 500 mg  Status:  Discontinued     500 mg 100 mL/hr over 60 Minutes Intravenous Every 8 hours 05/25/19 1408 06/01/19 1115       I have personally reviewed the following labs and images: CBC: Recent Labs  Lab 06/15/19 0403 06/18/19 0359 06/19/19 0314 06/20/19 0422  WBC 5.3 4.5 6.4 7.8  NEUTROABS 3.1  --   --   --   HGB 7.5* 8.3* 8.8* 8.5*  HCT 24.8* 27.2* 29.2* 28.7*  MCV 84.4 85.3 84.1 84.9  PLT 217 218 255 254   BMP &GFR Recent Labs  Lab 06/15/19 0403 06/17/19 0226  06/18/19 0359 06/19/19 0313 06/20/19 0422  NA 131* 129* 131* 131* 134*  K 3.9 3.8 4.4 4.0 4.0  CL 99 95* 98 98 100  CO2 25 24 23 25 25   GLUCOSE 164* 282* 350* 176* 137*  BUN 10 16 19 16 16   CREATININE 0.53* 0.62 0.65 0.59* 0.61  CALCIUM 8.1* 8.3* 8.4* 8.5* 8.5*  MG 1.6* 1.6* 1.8 1.7 1.7  PHOS 3.1 3.2 3.0 3.5 4.1   Estimated Creatinine Clearance: 148.2 mL/min (by C-G formula based on SCr of 0.61 mg/dL). Liver & Pancreas: Recent Labs  Lab 06/15/19 0403 06/18/19 0359 06/19/19 0313 06/20/19 0422  AST 9* 12*  --   --   ALT 8 11  --   --   ALKPHOS 57 78  --   --   BILITOT 0.3 0.4  --   --   PROT 7.1 7.4  --   --   ALBUMIN 1.6* 1.9* 2.0* 2.1*   No results for input(s): LIPASE, AMYLASE in the last 168 hours. No results for input(s): AMMONIA in the last 168 hours. Diabetic: No results for input(s): HGBA1C in the last 72 hours. Recent Labs  Lab 06/19/19 0250 06/19/19 1106 06/19/19 1641 06/19/19 1922 06/20/19 0355  GLUCAP 169* 233* 183* 208* 149*   Cardiac Enzymes: No results for input(s): CKTOTAL, CKMB, CKMBINDEX, TROPONINI in the last 168 hours. No results for input(s): PROBNP in the last 8760 hours. Coagulation Profile: No results for input(s): INR, PROTIME in the last 168 hours. Thyroid Function Tests: No results for input(s): TSH, T4TOTAL, FREET4, T3FREE, THYROIDAB in the last 72 hours. Lipid Profile: No results for input(s): CHOL, HDL, LDLCALC, TRIG, CHOLHDL, LDLDIRECT in the last 72 hours. Anemia Panel: No results for input(s): VITAMINB12, FOLATE, FERRITIN, TIBC, IRON, RETICCTPCT in the last 72 hours. Urine analysis:    Component Value Date/Time   COLORURINE YELLOW 04/20/2019 1708   APPEARANCEUR CLEAR 04/20/2019 1708   LABSPEC 1.022 04/20/2019 1708   PHURINE 5.0 04/20/2019 1708   GLUCOSEU >=500 (A) 04/20/2019 1708   HGBUR SMALL (A) 04/20/2019 1708   BILIRUBINUR NEGATIVE 04/20/2019 1708   KETONESUR 5 (A) 04/20/2019 1708   PROTEINUR NEGATIVE 04/20/2019 1708    UROBILINOGEN 0.2 09/12/2018 0949   NITRITE NEGATIVE 04/20/2019 1708   LEUKOCYTESUR NEGATIVE 04/20/2019 1708   Sepsis Labs: Invalid input(s): PROCALCITONIN, [place]  Microbiology: No results found for this or any previous visit (from the past 240 hour(s)).  Radiology Studies: No results found.    Pollyanna Levay T. Alorton  If 7PM-7AM, please contact night-coverage [email or web address] Password [facility] 06/20/2019, 10:36  AM  

## 2019-06-20 NOTE — Progress Notes (Addendum)
Patient ambulated 929ft in the hallway with much encouragement. Ambulated with walker.No shortness of breath. No distress upon ambulating. Gait steady. Encouraged to walk more in the hallway. Patient keeps reiterating that he walks in his room and sits in the chair.

## 2019-06-21 DIAGNOSIS — D649 Anemia, unspecified: Secondary | ICD-10-CM

## 2019-06-21 LAB — CBC
HCT: 25.3 % — ABNORMAL LOW (ref 39.0–52.0)
Hemoglobin: 7.7 g/dL — ABNORMAL LOW (ref 13.0–17.0)
MCH: 25.5 pg — ABNORMAL LOW (ref 26.0–34.0)
MCHC: 30.4 g/dL (ref 30.0–36.0)
MCV: 83.8 fL (ref 80.0–100.0)
Platelets: 221 10*3/uL (ref 150–400)
RBC: 3.02 MIL/uL — ABNORMAL LOW (ref 4.22–5.81)
RDW: 17.6 % — ABNORMAL HIGH (ref 11.5–15.5)
WBC: 5.5 10*3/uL (ref 4.0–10.5)
nRBC: 0 % (ref 0.0–0.2)

## 2019-06-21 LAB — HEMOGLOBIN AND HEMATOCRIT, BLOOD
HCT: 27.4 % — ABNORMAL LOW (ref 39.0–52.0)
Hemoglobin: 8.5 g/dL — ABNORMAL LOW (ref 13.0–17.0)

## 2019-06-21 LAB — GLUCOSE, CAPILLARY
Glucose-Capillary: 138 mg/dL — ABNORMAL HIGH (ref 70–99)
Glucose-Capillary: 172 mg/dL — ABNORMAL HIGH (ref 70–99)
Glucose-Capillary: 175 mg/dL — ABNORMAL HIGH (ref 70–99)
Glucose-Capillary: 202 mg/dL — ABNORMAL HIGH (ref 70–99)

## 2019-06-21 LAB — RENAL FUNCTION PANEL
Albumin: 1.9 g/dL — ABNORMAL LOW (ref 3.5–5.0)
Anion gap: 7 (ref 5–15)
BUN: 14 mg/dL (ref 6–20)
CO2: 25 mmol/L (ref 22–32)
Calcium: 8.3 mg/dL — ABNORMAL LOW (ref 8.9–10.3)
Chloride: 102 mmol/L (ref 98–111)
Creatinine, Ser: 0.61 mg/dL (ref 0.61–1.24)
GFR calc Af Amer: >60 mL/min (ref >60)
GFR calc non Af Amer: >60 mL/min (ref >60)
Glucose, Bld: 145 mg/dL — ABNORMAL HIGH (ref 70–99)
Phosphorus: 4.1 mg/dL (ref 2.5–4.6)
Potassium: 3.8 mmol/L (ref 3.5–5.1)
Sodium: 134 mmol/L — ABNORMAL LOW (ref 135–145)

## 2019-06-21 LAB — IRON AND TIBC
Iron: 65 ug/dL (ref 45–182)
Saturation Ratios: 29 % (ref 17.9–39.5)
TIBC: 223 ug/dL — ABNORMAL LOW (ref 250–450)
UIBC: 158 ug/dL

## 2019-06-21 LAB — RETICULOCYTES
Immature Retic Fract: 12.1 % (ref 2.3–15.9)
RBC.: 3.35 MIL/uL — ABNORMAL LOW (ref 4.22–5.81)
Retic Count, Absolute: 39.2 10*3/uL (ref 19.0–186.0)
Retic Ct Pct: 1.2 % (ref 0.4–3.1)

## 2019-06-21 LAB — MAGNESIUM: Magnesium: 1.8 mg/dL (ref 1.7–2.4)

## 2019-06-21 LAB — FERRITIN: Ferritin: 340 ng/mL — ABNORMAL HIGH (ref 24–336)

## 2019-06-21 LAB — FOLATE: Folate: 5.9 ng/mL — ABNORMAL LOW (ref >5.9)

## 2019-06-21 LAB — VITAMIN B12: Vitamin B-12: 628 pg/mL (ref 180–914)

## 2019-06-21 MED ORDER — FOLIC ACID 1 MG PO TABS
1.0000 mg | ORAL_TABLET | Freq: Every day | ORAL | Status: DC
  Administered 2019-06-21 – 2019-06-22 (×2): 1 mg via ORAL
  Filled 2019-06-21 (×2): qty 1

## 2019-06-21 MED ORDER — STERILE WATER FOR INJECTION IV SOLN
INTRAVENOUS | Status: AC
  Filled 2019-06-21: qty 473.6

## 2019-06-21 MED ORDER — MORPHINE SULFATE ER 15 MG PO TBCR
15.0000 mg | EXTENDED_RELEASE_TABLET | Freq: Two times a day (BID) | ORAL | Status: DC
  Administered 2019-06-21: 15 mg via ORAL
  Filled 2019-06-21 (×2): qty 1

## 2019-06-21 NOTE — Progress Notes (Signed)
PHARMACY - TOTAL PARENTERAL NUTRITION CONSULT NOTE  Indication:  Infected pancreatic pseudocyst  Patient Measurements: Height: 5\' 11"  (180.3 cm) Weight: 285 lb 7.9 oz (129.5 kg) IBW/kg (Calculated) : 75.3 TPN AdjBW (KG): 91.7 Body mass index is 39.82 kg/m.  Assessment:  67 YOM presented on 05/12/19 with DKA.  Patient was recently discharged after treatment for DKA and acute pancreatitis.  CT showed worsening pancreatitis and the development of a fluid collection.  Patient was started on tube feeding on 1/28 and tolerated it well through 2/10.  A clear liquid diet was initiated on 05/22/19 but intake was minimal, and was off and on a clear diet until 2/15.  EGD on 2/11 showed esophagitis/duodenitis and patient underwent pancreatic and biliary stent placements on 2/12.  He is s/p necrosectomy for pancreatic pseudocyst/necrosis on 2/15 and again on 2/17.  Feeding tube placement delayed due to frequent procedures.  Patient was advanced to a cardiac diet on 2/15 and intake has been inadequate.  In setting of severe malutrition, Pharmacy consulted to manage TPN.  Glucose / Insulin: hx IDDM admitted with DKA - A1c 13.2%.  CBGs better controlled, 138-183 (got Dexamethasone 10mg  on 3/1 and 3/3). Required  20 units rSSI/24hrs. Currently 45 units of insulin in TPN.  Recommend trying to avoid further long-acting insulin as will begin cycling TPN and this can cause lows when TPN is off.  (*was on Lantus 45/20 BID and significant amount of SSI while on goal rate TF/oral diet)  Electrolytes:  Na 134/102 (improving), K 3.8(on Bumex), Mag 1.8 (on Bumex), Phos 4.1, CoCa 10.1 Renal: SCr 0.61 (stable), BUN WNL. LFTs / TGs: LFTs / tbili / TG within normal limits Prealbumin / albumin: Prealbumin 8.3>>7.5, albumin 2.1 Intake / Output; MIVF: UOP 0.5 ml/kg/hr; LBM 3/2 x1.   GI Imaging: none since TPN consult Surgeries / Procedures:  2/19 necrosectomy, no gross lesion in esophagus, erythematous mucosa 2/22  necrosectomy 2/26 repeat necrosectomy 3/1 repeat necrosectomy  3/3 repeat necrosectomy x 3hrs with necrosis/pus debrided, no gross esophagus lesions, erythrematous mucosa of stomach  Central access: PICC placed 06/05/19 TPN start date: 06/05/19  Nutritional Goals (per RD rec on 3/1): 2300-2500 kCal, 130-145g protein, >2L fluid per day  Current Nutrition:  Heart diet - 50% and 80% x 2 meals Ensure Enlive - refusing Prostat - refusing 1/2 TPN   Plan:   Continue concentrated TPN at 1/2 total volume, will advance to 12 hour infusion time - Cycle 960 mL over 12 hours: Infuse at 44 mL/hr for 1 hour then increase to 87 mL/hr for 10 hours then decrease back to 44 mL/hr for 1 hour. This TPN provides 71 g AA, 163 g CHO, and 36 g ILE for a total of 1166 kcals meeting 50% of patient needs.   Electrolytes in TPN: Na 80 mEq/L (76.74mEq/bag), K 65 mEq/L (57.61 mEq/bag), Ca 5 mEq/L, Mg 20 mEq/L, and Phos 10 mmol/L. Cl:Ac 2:1 for now.   Continue resistant SSI to Q4H +  continue regular insulin in TPN bag to 45 units. *If diet remains consistent, consider transitioning to basal and meal coverage subcutaneous insulin and removing insulin from TPN bag to help better control CBGs  F/U labs, CBGs, po intake and discharge nutrition plans >> Current plan is to discharge home Monday with short term TPN. Will cycle over 12 hours for discharge.   Patient complains that he has a headache when TPN is off and it goes away after TPN is started.  No obvious issues with glucose  or electrolytes that might explain headaches.  Continue TPN at 1/2 rate over 12 hours.   Alanda Slim, PharmD, [facility] Clinical Pharmacist Please see AMION for all Pharmacists' Contact Phone Numbers 06/21/2019, 7:10 AM

## 2019-06-21 NOTE — Plan of Care (Signed)

## 2019-06-21 NOTE — Progress Notes (Signed)
PROGRESS NOTE  Caleb Chambers JME:[id number] DOB: 03-Oct-1965   PCP: Patient, No Pcp Per  Patient is from: Home  DOA: 05/12/2019 LOS: 40  Brief Narrative / Interim history: 54 year old male with history of DM-2, HTN, morbid obesity, pancreatitis, noncompliance and gout presented with abdominal pain and poor oral intake and admitted with diabetic ketoacidosis, AKI and acute pancreatitis with pseudocyst.   Patient underwent multiple EGDs with cystogastrostomy tube creation and double-pigtail replacements.  Fluid culture positive for staph aureus.  Patient has been on vancomycin and metronidazole, and transitioned to doxycycline and metronidazole. Plan is to continue antibiotic until repeat CT abdomen and pelvis with contrast in about 2 weeks.  Patient has been on TPN but oral intake improving.  His TPN has been reduced to half a total volume as of 06/18/2019.  Plan is to discharge home with half a total volume TPN, which won't happen until 06/22/2019 as [facility] won't be able to have the new TPN ready before that.  Subjective: No major events overnight or this morning.  No complaints.  Denies chest pain, dyspnea, nausea, vomiting, diarrhea, melena or hematochezia.  Abdominal pain fairly controlled.  Objective: Vitals:   06/20/19 1700 06/20/19 1940 06/21/19 0351 06/21/19 0800  BP:  121/73 114/74 119/73  Pulse:  90 95 94  Resp:  20 20   Temp:  98.5 F (36.9 C) 98.4 F (36.9 C) 98.6 F (37 C)  TempSrc:  Oral Oral Oral  SpO2: 98% 100% 99% 99%  Weight:      Height:        Intake/Output Summary (Last 24 hours) at 06/21/2019 1011 Last data filed at 06/21/2019 0800 Gross per 24 hour  Intake 1671.12 ml  Output 1450 ml  Net 221.12 ml   Filed Weights   06/18/19 0335 06/19/19 0247 06/20/19 0401  Weight: 131.6 kg 130.7 kg 129.5 kg    Examination:  GENERAL: No acute distress.  Appears well.  HEENT: MMM.  Vision and hearing grossly intact.  NECK: Supple.  No apparent JVD.  RESP:  No  IWOB. Good air movement bilaterally. CVS:  RRR. Heart sounds normal.  ABD/GI/GU: Bowel sounds present. Soft.  Mild diffuse tenderness to palpation. MSK/EXT:  Moves extremities. No apparent deformity.  1+ pitting edema bilaterally SKIN: no apparent skin lesion or wound NEURO: Awake, alert and oriented appropriately.  No apparent focal neuro deficit. PSYCH: Calm. Normal affect.  Procedures:  2/11-EUS andAXIOScystogastrostomy tube creation with placement of double pigtail and removal of 1.2 L of fluid  2/15-removed pigtails  02/17 and 02/19-EGD 02/22-double pigtails were replaced. 02/26-EGDdouble pigtails stents removed and performed necrosectomy procedure. 03/01-EGD with necrosectomy and double pigtails stents replaced 3/03-EGD with pancreatic necrosectomy and pigtail replacement.  EGD revealed erythematous mucosa in the stomach.  Assessment & Plan: Acute pancreatitis with necrosis and infected pseudocyst with staph aureus -Cystogastrostomy tube, EGDs and necrosectomy as above -Vancomycin> doxycycline + Flagyl-until repeat CT abdomen with contrast in two weeks -Continue TPN per pharmacy. P.O. intake improving as well.  TPN volume reduced to half. -Continue PPI and antiemetics -Decreased MS Contin to 15 mg twice daily -Discontinue oxycodone-has not needed this in the last 24 hours.  Uncontrolled DM-2 with DKA and hyperglycemia: A1c 13.2%.  DKA resolved. Recent Labs    06/20/19 1615 06/20/19 2043 06/21/19 0601  GLUCAP 174* 183* 138*  -SSI-moderate  ACHS and NovoLog AC 3 units -He is also getting 45 units in nocturnal TPN  Essential hypertension: Normotensive -Continue lisinopril, amlodipine and bumetanide -Discontinued HCTZ  in the setting of hyponatremia  Hypokalemia/hyponatremia/hypomagnesemia: Hyponatremia improved. -Pharmacy replacing with TPN -Discontinued HCTZ  Insomnia -Ramelteon nightly.  -Ambien as needed if no resolution with ramelteon  Normocytic anemia: Hgb  17.5 (admit)>> 6.8>1u> 8.8>> 7.7.  He denies melena or hematochezia.  Anemia panel on 2/12 consistent with ACD. -Recheck H&H today -Recheck anemia panel -FOBT if further drop in Hgb.  Bilateral lower extremity edema: Likely due to malnutrition, anemia and hypoalbuminemia with possible venous insufficiency.  No history of CHF. -Optimize nutrition -Encourage leg elevation -Encouraged TED hose-but resistant to this saying he can't walk with TED hose.   Severe protein calorie malnutrition Nutrition Problem: Severe Malnutrition Etiology: acute illness(pancreatitis)  Signs/Symptoms: energy intake < or equal to 50% for > or equal to 5 days, moderate fat depletion, mild fat depletion, moderate muscle depletion, mild muscle depletion  Interventions: Tube feeding   DVT prophylaxis: Subcu Lovenox Code Status: Full code Family Communication: Patient and/or RN. Available if any question.  Discharge barrier: Home TPN-[facility] not able to have home TPN ready over the weekend Patient is from: Home Final disposition: Likely home with TPN on 06/22/2019  Consultants: GI   Microbiology summarized: 1/26-COVID-19 negative 1/27-MRSA PCR negative 2/8-C. difficile negative 2/11-fluid culture with staph aureus resistant to penicillin  Sch Meds:  Scheduled Meds: . sodium chloride   Intravenous Once  . allopurinol  300 mg Oral Daily  . amLODipine  10 mg Oral Daily  . bumetanide  0.5 mg Oral Daily  . Chlorhexidine Gluconate Cloth  6 each Topical Daily  . doxycycline  100 mg Oral Q12H  . enoxaparin (LOVENOX) injection  40 mg Subcutaneous Q24H  . feeding supplement (ENSURE ENLIVE)  237 mL Oral TID BM  . insulin aspart  0-15 Units Subcutaneous TID WC  . insulin aspart  0-5 Units Subcutaneous QHS  . insulin aspart  3 Units Subcutaneous TID WC  . lipase/protease/amylase  36,000 Units Oral TID AC  . lisinopril  20 mg Oral Daily  . metroNIDAZOLE  500 mg Oral Q8H  . morphine  15 mg Oral q morning -  10a  . morphine  30 mg Oral QHS  . multivitamin with minerals  1 tablet Oral Daily  . pantoprazole  40 mg Oral Daily  . ramelteon  8 mg Oral QHS   Continuous Infusions: . magnesium sulfate bolus IVPB    . TPN CYCLIC-ADULT (ION)     PRN Meds:.acetaminophen, dextrose, lidocaine, lidocaine-EPINEPHrine, Muscle Rub, ondansetron (ZOFRAN) IV, oxyCODONE, phenol, simethicone, sodium chloride flush, zolpidem  Antimicrobials: Anti-infectives (From admission, onward)   Start     Dose/Rate Route Frequency Ordered Stop   06/10/19 1000  doxycycline (VIBRA-TABS) tablet 100 mg     100 mg Oral Every 12 hours 06/09/19 0743     06/09/19 1400  metroNIDAZOLE (FLAGYL) tablet 500 mg     500 mg Oral Every 8 hours 06/09/19 0743     06/05/19 1600  vancomycin (VANCOREADY) IVPB 1250 mg/250 mL  Status:  Discontinued     1,250 mg 166.7 mL/hr over 90 Minutes Intravenous Every 12 hours 06/05/19 1509 06/09/19 0743   06/02/19 1300  metroNIDAZOLE (FLAGYL) IVPB 500 mg  Status:  Discontinued     500 mg 100 mL/hr over 60 Minutes Intravenous Every 8 hours 06/02/19 1105 06/09/19 0743   05/30/19 1700  vancomycin (VANCOREADY) IVPB 1750 mg/350 mL  Status:  Discontinued     1,750 mg 175 mL/hr over 120 Minutes Intravenous Every 12 hours 05/29/19 1533 05/29/19 2206  05/30/19 1100  vancomycin (VANCOREADY) IVPB 1750 mg/350 mL  Status:  Discontinued     1,750 mg 175 mL/hr over 120 Minutes Intravenous Every 12 hours 05/29/19 2206 06/05/19 1509   05/29/19 1545  vancomycin (VANCOCIN) 2,500 mg in sodium chloride 0.9 % 500 mL IVPB     2,500 mg 250 mL/hr over 120 Minutes Intravenous  Once 05/29/19 1533 05/30/19 0014   05/25/19 1500  ciprofloxacin (CIPRO) IVPB 400 mg  Status:  Discontinued     400 mg 200 mL/hr over 60 Minutes Intravenous Every 12 hours 05/25/19 1408 06/02/19 1105   05/25/19 1500  metroNIDAZOLE (FLAGYL) IVPB 500 mg  Status:  Discontinued     500 mg 100 mL/hr over 60 Minutes Intravenous Every 8 hours 05/25/19 1408  06/01/19 1115       I have personally reviewed the following labs and images: CBC: Recent Labs  Lab 06/15/19 0403 06/18/19 0359 06/19/19 0314 06/20/19 0422 06/21/19 0430  WBC 5.3 4.5 6.4 7.8 5.5  NEUTROABS 3.1  --   --   --   --   HGB 7.5* 8.3* 8.8* 8.5* 7.7*  HCT 24.8* 27.2* 29.2* 28.7* 25.3*  MCV 84.4 85.3 84.1 84.9 83.8  PLT 217 218 255 254 221   BMP &GFR Recent Labs  Lab 06/17/19 0226 06/18/19 0359 06/19/19 0313 06/20/19 0422 06/21/19 0430  NA 129* 131* 131* 134* 134*  K 3.8 4.4 4.0 4.0 3.8  CL 95* 98 98 100 102  CO2 24 23 25 25 25   GLUCOSE 282* 350* 176* 137* 145*  BUN 16 19 16 16 14   CREATININE 0.62 0.65 0.59* 0.61 0.61  CALCIUM 8.3* 8.4* 8.5* 8.5* 8.3*  MG 1.6* 1.8 1.7 1.7 1.8  PHOS 3.2 3.0 3.5 4.1 4.1   Estimated Creatinine Clearance: 148.2 mL/min (by C-G formula based on SCr of 0.61 mg/dL). Liver & Pancreas: Recent Labs  Lab 06/15/19 0403 06/18/19 0359 06/19/19 0313 06/20/19 0422 06/21/19 0430  AST 9* 12*  --   --   --   ALT 8 11  --   --   --   ALKPHOS 57 78  --   --   --   BILITOT 0.3 0.4  --   --   --   PROT 7.1 7.4  --   --   --   ALBUMIN 1.6* 1.9* 2.0* 2.1* 1.9*   No results for input(s): LIPASE, AMYLASE in the last 168 hours. No results for input(s): AMMONIA in the last 168 hours. Diabetic: No results for input(s): HGBA1C in the last 72 hours. Recent Labs  Lab 06/20/19 0355 06/20/19 1150 06/20/19 1615 06/20/19 2043 06/21/19 0601  GLUCAP 149* 166* 174* 183* 138*   Cardiac Enzymes: No results for input(s): CKTOTAL, CKMB, CKMBINDEX, TROPONINI in the last 168 hours. No results for input(s): PROBNP in the last 8760 hours. Coagulation Profile: No results for input(s): INR, PROTIME in the last 168 hours. Thyroid Function Tests: No results for input(s): TSH, T4TOTAL, FREET4, T3FREE, THYROIDAB in the last 72 hours. Lipid Profile: No results for input(s): CHOL, HDL, LDLCALC, TRIG, CHOLHDL, LDLDIRECT in the last 72 hours. Anemia  Panel: No results for input(s): VITAMINB12, FOLATE, FERRITIN, TIBC, IRON, RETICCTPCT in the last 72 hours. Urine analysis:    Component Value Date/Time   COLORURINE YELLOW 04/20/2019 1708   APPEARANCEUR CLEAR 04/20/2019 1708   LABSPEC 1.022 04/20/2019 1708   PHURINE 5.0 04/20/2019 1708   GLUCOSEU >=500 (A) 04/20/2019 1708   HGBUR SMALL (A) 04/20/2019 1708  BILIRUBINUR NEGATIVE 04/20/2019 1708   KETONESUR 5 (A) 04/20/2019 1708   PROTEINUR NEGATIVE 04/20/2019 1708   UROBILINOGEN 0.2 09/12/2018 0949   NITRITE NEGATIVE 04/20/2019 1708   LEUKOCYTESUR NEGATIVE 04/20/2019 1708   Sepsis Labs: Invalid input(s): PROCALCITONIN, [place]  Microbiology: No results found for this or any previous visit (from the past 240 hour(s)).  Radiology Studies: No results found.    Elber Galyean T. [place]  If 7PM-7AM, please contact night-coverage [email or web address] Password [facility] 06/21/2019, 10:11 AM

## 2019-06-22 LAB — CBC
HCT: 26.7 % — ABNORMAL LOW (ref 39.0–52.0)
Hemoglobin: 8.1 g/dL — ABNORMAL LOW (ref 13.0–17.0)
MCH: 25.2 pg — ABNORMAL LOW (ref 26.0–34.0)
MCHC: 30.3 g/dL (ref 30.0–36.0)
MCV: 83.2 fL (ref 80.0–100.0)
Platelets: 240 10*3/uL (ref 150–400)
RBC: 3.21 MIL/uL — ABNORMAL LOW (ref 4.22–5.81)
RDW: 17.4 % — ABNORMAL HIGH (ref 11.5–15.5)
WBC: 6.9 10*3/uL (ref 4.0–10.5)
nRBC: 0 % (ref 0.0–0.2)

## 2019-06-22 LAB — COMPREHENSIVE METABOLIC PANEL
ALT: 8 U/L (ref 0–44)
AST: 11 U/L — ABNORMAL LOW (ref 15–41)
Albumin: 2.2 g/dL — ABNORMAL LOW (ref 3.5–5.0)
Alkaline Phosphatase: 92 U/L (ref 38–126)
Anion gap: 9 (ref 5–15)
BUN: 12 mg/dL (ref 6–20)
CO2: 24 mmol/L (ref 22–32)
Calcium: 8.5 mg/dL — ABNORMAL LOW (ref 8.9–10.3)
Chloride: 100 mmol/L (ref 98–111)
Creatinine, Ser: 0.6 mg/dL — ABNORMAL LOW (ref 0.61–1.24)
GFR calc Af Amer: >60 mL/min (ref >60)
GFR calc non Af Amer: >60 mL/min (ref >60)
Glucose, Bld: 165 mg/dL — ABNORMAL HIGH (ref 70–99)
Potassium: 3.8 mmol/L (ref 3.5–5.1)
Sodium: 133 mmol/L — ABNORMAL LOW (ref 135–145)
Total Bilirubin: 0.3 mg/dL (ref 0.3–1.2)
Total Protein: 7.2 g/dL (ref 6.5–8.1)

## 2019-06-22 LAB — DIFFERENTIAL
Abs Immature Granulocytes: 0.05 10*3/uL (ref 0.00–0.07)
Basophils Absolute: 0 10*3/uL (ref 0.0–0.1)
Basophils Relative: 0 %
Eosinophils Absolute: 0.1 10*3/uL (ref 0.0–0.5)
Eosinophils Relative: 1 %
Immature Granulocytes: 1 %
Lymphocytes Relative: 33 %
Lymphs Abs: 2.3 10*3/uL (ref 0.7–4.0)
Monocytes Absolute: 0.7 10*3/uL (ref 0.1–1.0)
Monocytes Relative: 9 %
Neutro Abs: 3.8 10*3/uL (ref 1.7–7.7)
Neutrophils Relative %: 56 %

## 2019-06-22 LAB — GLUCOSE, CAPILLARY: Glucose-Capillary: 161 mg/dL — ABNORMAL HIGH (ref 70–99)

## 2019-06-22 LAB — MAGNESIUM: Magnesium: 1.7 mg/dL (ref 1.7–2.4)

## 2019-06-22 LAB — PREALBUMIN: Prealbumin: 22.2 mg/dL (ref 18–38)

## 2019-06-22 LAB — TRIGLYCERIDES: Triglycerides: 105 mg/dL (ref <150)

## 2019-06-22 LAB — PHOSPHORUS: Phosphorus: 3.8 mg/dL (ref 2.5–4.6)

## 2019-06-22 MED ORDER — METRONIDAZOLE 500 MG PO TABS: 500.0000 mg | ORAL_TABLET | Freq: Three times a day (TID) | ORAL | 0 refills | Status: DC

## 2019-06-22 MED ORDER — POTASSIUM CHLORIDE ER 10 MEQ PO TBCR: 10.0000 meq | EXTENDED_RELEASE_TABLET | Freq: Every day | ORAL | 0 refills | Status: AC

## 2019-06-22 MED ORDER — DOXYCYCLINE HYCLATE 100 MG PO TABS: 100.0000 mg | ORAL_TABLET | Freq: Two times a day (BID) | ORAL | 0 refills | Status: DC

## 2019-06-22 MED ORDER — INSULIN ASPART 100 UNIT/ML FLEXPEN: 3.0000 [IU] | PEN_INJECTOR | Freq: Three times a day (TID) | SUBCUTANEOUS | 1 refills | Status: DC

## 2019-06-22 MED ORDER — PANTOPRAZOLE SODIUM 40 MG PO TBEC: 40.0000 mg | DELAYED_RELEASE_TABLET | Freq: Every day | ORAL | 0 refills | Status: DC

## 2019-06-22 MED ORDER — METFORMIN HCL ER 500 MG PO TB24: 1000.0000 mg | ORAL_TABLET | Freq: Every day | ORAL | 1 refills | Status: AC

## 2019-06-22 MED ORDER — PANCRELIPASE (LIP-PROT-AMYL) 36000-114000 UNITS PO CPEP: 36000.0000 [IU] | ORAL_CAPSULE | Freq: Three times a day (TID) | ORAL | 1 refills | Status: DC

## 2019-06-22 MED ORDER — FOLIC ACID 1 MG PO TABS: 1.0000 mg | ORAL_TABLET | Freq: Every day | ORAL | 1 refills | Status: DC

## 2019-06-22 MED ORDER — LISINOPRIL 20 MG PO TABS: 20.0000 mg | ORAL_TABLET | Freq: Every day | ORAL | 1 refills | Status: DC

## 2019-06-22 MED ORDER — MORPHINE SULFATE ER 15 MG PO TBCR: 15.0000 mg | EXTENDED_RELEASE_TABLET | Freq: Two times a day (BID) | ORAL | 0 refills | Status: DC

## 2019-06-22 MED FILL — MORPHINE SULF ER 15 MG TAB: 15 | 7 days supply | Qty: 14 | Fill #0

## 2019-06-22 MED FILL — PENTIPS 31G X 8 MM MISC: 31G X 8 MM | 30 days supply | Qty: 100 | Fill #0

## 2019-06-22 MED FILL — DOXYCYCLINE HYCLATE 100 MG: 100 | 15 days supply | Qty: 30 | Fill #0

## 2019-06-22 MED FILL — LISINOPRIL 20 MG TABLET: 20 | 90 days supply | Qty: 90 | Fill #0

## 2019-06-22 MED FILL — METFORMIN HCL ER 500 MG TB2: 500 | 45 days supply | Qty: 90 | Fill #0

## 2019-06-22 MED FILL — FOLIC ACID 1 MG TABS: 1 | 90 days supply | Qty: 90 | Fill #0

## 2019-06-22 MED FILL — CREON DR 12,000 UNITS CAP: 12000 | 30 days supply | Qty: 270 | Fill #0

## 2019-06-22 MED FILL — PANTOPRAZOLE SOD DR 40 MG T: 40 | 30 days supply | Qty: 30 | Fill #0

## 2019-06-22 MED FILL — NOVOLOG FLEXPEN SYRINGE: 100 | 30 days supply | Qty: 3 | Fill #0

## 2019-06-22 MED FILL — metroNIDAZOLE 500 MG TABS: 500 | 15 days supply | Qty: 45 | Fill #0

## 2019-06-22 NOTE — Progress Notes (Signed)
PHARMACY - TOTAL PARENTERAL NUTRITION CONSULT NOTE  Indication:  Infected pancreatic pseudocyst  Patient Measurements: Height: 5\' 11"  (180.3 cm) Weight: 281 lb 15.5 oz (127.9 kg) IBW/kg (Calculated) : 75.3 TPN AdjBW (KG): 91.7 Body mass index is 39.33 kg/m.  Assessment:  56 YOM presented on 05/12/19 with DKA.  Patient was recently discharged after treatment for DKA and acute pancreatitis.  CT showed worsening pancreatitis and the development of a fluid collection.  Patient was started on tube feeding on 1/28 and tolerated it well through 2/10.  A clear liquid diet was initiated on 05/22/19 but intake was minimal, and was off and on a clear diet until 2/15.  EGD on 2/11 showed esophagitis/duodenitis and patient underwent pancreatic and biliary stent placements on 2/12.  He is s/p necrosectomy for pancreatic pseudocyst/necrosis on 2/15 and again on 2/17.  Feeding tube placement delayed due to frequent procedures.  Patient was advanced to a cardiac diet on 2/15 and intake has been inadequate.  In setting of severe malutrition, Pharmacy consulted to manage TPN.  Glucose / Insulin: hx IDDM admitted with DKA - A1c 13.2%.  CBGs improving, 160-202 (got Dexamethasone 10mg  on 3/1 and 3/3). Required 11 units rSSI/24hrs. Currently 45 units of insulin in TPN.  Recommend trying to avoid further long-acting insulin as will begin cycling TPN and this can cause lows when TPN is off.  (*was on Lantus 45/20 BID and significant amount of SSI while on goal rate TF/oral diet)  Electrolytes:  Na 133/100 (improving), K 3.8 (on Bumex), Mag 1.7 (on Bumex), Phos 3.8, CoCa 9.9 Renal: SCr 0.60 (stable), BUN within normal limits.  LFTs / TGs: LFTs / tbili / TG within normal limits Prealbumin / albumin: Prealbumin much improved at 22.2, albumin 2.2 Intake / Output; MIVF: UOP 0.4 ml/kg/hr; LBM 3/6 x1.   GI Imaging: none since TPN consult Surgeries / Procedures:  2/19 necrosectomy, no gross lesion in esophagus, erythematous  mucosa 2/22 necrosectomy 2/26 repeat necrosectomy 3/1 repeat necrosectomy  3/3 repeat necrosectomy x 3hrs with necrosis/pus debrided, no gross esophagus lesions, erythrematous mucosa of stomach  Central access: PICC placed 06/05/19 TPN start date: 06/05/19  Nutritional Goals (per RD rec on 3/1): 2300-2500 kCal, 130-145g protein, >2L fluid per day  Current Nutrition:  Heart diet - 25% x2 meals recorded yesterday Ensure Enlive - refusing Prostat - refusing 1/2 TPN   Plan:   Discharging to home on TPN per [facility] today.  Current inpatient orders are concentrated TPN at 1/2 total volume, will advance to 12 hour infusion time - Cycle 960 mL over 12 hours: Infuse at 44 mL/hr for 1 hour then increase to 87 mL/hr for 10 hours then decrease back to 44 mL/hr for 1 hour. This TPN provides 71 g AA, 163 g CHO, and 36 g ILE for a total of 1166 kcals meeting 50% of patient needs.   *Consider lengthening cycle out to 16-18 hours as patient complaining of headache when off TPN although no obvious issues with glucose and electrolytes that might explain headaches.  Electrolytes in TPN: Na 80 mEq/L (76.74mEq/bag), K 65 mEq/L (57.61 mEq/bag), Ca 5 mEq/L, Mg 20 mEq/L, and Phos 10 mmol/L. Cl:Ac 2:1 for now.   Continue resistant SSI to Q4H +  continue regular insulin in TPN bag to 45 units.  *As diet improves including PM meal intake and TPN tapers further, will need to consider transitioning to basal and meal coverage subcutaneous insulin and removing insulin from TPN bag to help better control CBGs  Sloan Leiter, PharmD, BCPS, BCCCP Clinical Pharmacist Clinical phone 06/22/2019 until [phone] Please refer to [facility] for [place] numbers 06/22/2019, 7:50 AM

## 2019-06-22 NOTE — Plan of Care (Signed)
Plan of care reviewed with pt at bedside. PICC in place and will go home with TPN. Discharge education provided. Pt waiting for pharmacy meds and pump education. Pt stable at this time, will continue to monitor.  Problem: Education: Goal: Ability to describe self-care measures that may prevent or decrease complications (Diabetes Survival Skills Education) will improve Outcome: Completed/Met Goal: Individualized Educational Video(s) Outcome: Completed/Met   Problem: Cardiac: Goal: Ability to maintain an adequate cardiac output will improve Outcome: Completed/Met   Problem: Health Behavior/Discharge Planning: Goal: Ability to identify and utilize available resources and services will improve Outcome: Completed/Met Goal: Ability to manage health-related needs will improve Outcome: Completed/Met   Problem: Fluid Volume: Goal: Ability to achieve a balanced intake and output will improve Outcome: Completed/Met   Problem: Metabolic: Goal: Ability to maintain appropriate glucose levels will improve Outcome: Completed/Met   Problem: Nutritional: Goal: Maintenance of adequate nutrition will improve Outcome: Completed/Met Goal: Maintenance of adequate weight for body size and type will improve Outcome: Completed/Met   Problem: Respiratory: Goal: Will regain and/or maintain adequate ventilation Outcome: Completed/Met   Problem: Urinary Elimination: Goal: Ability to achieve and maintain adequate renal perfusion and functioning will improve Outcome: Completed/Met   Problem: Education: Goal: Knowledge of General Education information will improve Description: Including pain rating scale, medication(s)/side effects and non-pharmacologic comfort measures Outcome: Completed/Met   Problem: Health Behavior/Discharge Planning: Goal: Ability to manage health-related needs will improve Outcome: Completed/Met   Problem: Clinical Measurements: Goal: Ability to maintain clinical measurements  within normal limits will improve Outcome: Completed/Met Goal: Will remain free from infection Outcome: Completed/Met Goal: Diagnostic test results will improve Outcome: Completed/Met Goal: Respiratory complications will improve Outcome: Completed/Met Goal: Cardiovascular complication will be avoided Outcome: Completed/Met   Problem: Activity: Goal: Risk for activity intolerance will decrease Outcome: Completed/Met   Problem: Nutrition: Goal: Adequate nutrition will be maintained Outcome: Completed/Met   Problem: Coping: Goal: Level of anxiety will decrease Outcome: Completed/Met   Problem: Elimination: Goal: Will not experience complications related to bowel motility Outcome: Completed/Met Goal: Will not experience complications related to urinary retention Outcome: Completed/Met   Problem: Pain Managment: Goal: General experience of comfort will improve Outcome: Completed/Met   Problem: Safety: Goal: Ability to remain free from injury will improve Outcome: Completed/Met   Problem: Skin Integrity: Goal: Risk for impaired skin integrity will decrease Outcome: Completed/Met

## 2019-06-22 NOTE — Discharge Summary (Signed)
Physician Discharge Summary  Caleb Chambers ZSW:[id number] DOB: Aug 20, 1965 DOA: 05/12/2019  PCP: Patient, No Pcp Per  Admit date: 05/12/2019 Discharge date: 06/22/2019   Admitted From: Home Disposition: Home  Recommendations for Outpatient Follow-up:  1. Follow ups as below. 2. Please obtain CBC/BMP/Mag at follow up 3. Please follow up on the following pending results: None  Home Health: RN Equipment/Devices: None  Discharge Condition: Stable CODE STATUS: Full code  Follow-up Information    Care, [facility] Follow up.   Specialty: Home Health Services Why: [facility]  Contact information: [address] [place] [address] [phone]        Jeani Hawking, MD. Schedule an appointment as soon as possible for a visit in 1 week(s).   Specialty: Gastroenterology Contact information: [address] [place] [address] [phone]        Verlon Au, MD. Schedule an appointment as soon as possible for a visit in 1 week(s).   Specialty: Family Medicine Contact information: [address] [place] [address] [phone]          Hospital Course: 54 year old male with history of DM-2, HTN, morbid obesity, pancreatitis, noncompliance and gout presented with abdominal pain and poor oral intake and admitted with diabetic ketoacidosis, AKI and acute pancreatitis with pseudocyst.   Patient underwent multiple EGDs with cystogastrostomy tube creation and double-pigtail replacements.  Fluid culture positive for staph aureus.  Patient has been on vancomycin and metronidazole, and transitioned to doxycycline and metronidazole. Plan is to continue antibiotic until repeat CT abdomen and pelvis with contrast in about 2 weeks.  Patient has been on TPN but oral intake improving.  His TPN has been reduced to half a total volume as of 06/18/2019.  Patient discharged on one-half (1/2) volume TPN at night, doxycycline and  Flagyl.  GI, Dr. Elnoria Howard to arrange outpatient CT abdomen and pelvis with contrast.   See individual problem list below for more hospital course.  Discharge Diagnoses:  Acute pancreatitis with necrosis and infected pseudocyst with staph aureus -Cystogastrostomy tube, EGDs and necrosectomy as above -Vancomycin>>> doxycycline + Flagyl-until repeat CT abdomen with contrast in two weeks -P.O. intake improving.  TPN volume reduced to half as of 06/18/2019.  HH RN for TPN -Continue Protonix -MS Contin to 15 mg twice daily, #14  Uncontrolled DM-2 with DKA and hyperglycemia: A1c 13.2%.  DKA resolved. Recent Labs    06/21/19 1637 06/21/19 2104 06/22/19 0609  GLUCAP 175* 202* 161*  -He required 11 units in the last 24 hours. So, discharged on Metformin XR 1000 mg daily and NovoLog 3 units AC.  Discontinued home basal insulin. -He is also getting 45 units in nocturnal TPN -Continue statin.  Essential hypertension: Normotensive -Continue lisinopril, amlodipine and bumetanide -Discontinued HCTZ in the setting of hyponatremia  Hypokalemia/hyponatremia/hypomagnesemia: Hyponatremia improved. -Pharmacy replacing with TPN.  HH to follow require labs. -Discontinued HCTZ  Insomnia -Continue Ambien Normocytic anemia: Hgb 17.5 (admit)>> 6.8>1u> 8.8>> 7.7> 8.1.  He denies melena or hematochezia.  Anemia panel on 2/12 and 3/7 consistent with ACD. -Recheck CBC in 1 week.  Bilateral lower extremity edema: Likely due to malnutrition, anemia and hypoalbuminemia with possible venous insufficiency.  No history of CHF. -Encouraged leg elevation and TED hose. -Continue home Bumex.  Severe protein calorie malnutrition Nutrition Problem: Severe Malnutrition Etiology: acute illness(pancreatitis)  Signs/Symptoms: energy intake < or equal to 50% for > or equal to 5 days, moderate fat depletion, mild fat depletion, moderate  muscle depletion, mild muscle depletion  Interventions: Tube  feeding   Discharge Instructions  Discharge Instructions    Call MD for:  difficulty breathing, headache or visual disturbances   Complete by: As directed    Call MD for:  difficulty breathing, headache or visual disturbances   Complete by: As directed    Call MD for:  extreme fatigue   Complete by: As directed    Call MD for:  extreme fatigue   Complete by: As directed    Call MD for:  persistant dizziness or light-headedness   Complete by: As directed    Call MD for:  persistant nausea and vomiting   Complete by: As directed    Call MD for:  persistant nausea and vomiting   Complete by: As directed    Call MD for:  severe uncontrolled pain   Complete by: As directed    Call MD for:  severe uncontrolled pain   Complete by: As directed    Call MD for:  temperature >100.4   Complete by: As directed    Call MD for:  temperature >100.4   Complete by: As directed    Diet - low sodium heart healthy   Complete by: As directed    Diet Carb Modified   Complete by: As directed    Discharge instructions   Complete by: As directed    It has been a pleasure taking care of you! You were hospitalized and treated for acute complicated pancreatitis and diabetic ketoacidosis.  You were treated surgically medically.  You are now discharged on TPN, antibiotics and other medications.  They are also changes to your old medication during this hospitalization.  Please review your new medication list and the directions before you take your medications.    The gastroenterologist will arrange outpatient follow-up in 1 to 2 weeks.   Take care,   Discharge instructions   Complete by: As directed    It has been a pleasure taking care of you! You were hospitalized with acute pancreatitis with complication.  You were treated surgically and medically with improvement in your pancreatitis.  We are discharging you more antibiotics to continue treatment course until you have a repeat CT scan of your abdomen  in 1 to 2 weeks.  This will be arranged by your gastroenterologist.  We are also discharging you on TPN.  We have added new medications and/or made adjustments to your home medications during this hospitalization. Please review your new medication list and the directions before you take your medications.   Take care,   Home Health   Complete by: As directed    TPN per [facility] protocol for labs and dosing   To provide the following care/treatments: RN   Increase activity slowly   Complete by: As directed    Increase activity slowly   Complete by: As directed      Allergies as of 06/22/2019      Reactions   Penicillins Anaphylaxis, Hives, Swelling   Spoke with patient and he reported in his 46s he had a PCN (unsure which specific drug) and developed a rash with SOB. Reported no PCNs since.    Metoprolol Other (See Comments)   Chest pains - reported by [facility] and  [facility] 02/08/14   Clindamycin Rash, Other (See Comments)   Unknown reaction - reported by [facility] and [facility] 09-30-13      Medication List    STOP taking these medications   Basaglar KwikPen  100 UNIT/ML   famotidine 40 MG tablet Commonly known as: PEPCID   lisinopril-hydrochlorothiazide 20-12.5 MG tablet Commonly known as: ZESTORETIC     TAKE these medications   allopurinol 300 MG tablet Commonly known as: ZYLOPRIM Take 300 mg by mouth daily.   amLODipine 10 MG tablet Commonly known as: NORVASC Take 10 mg by mouth daily.   bumetanide 0.5 MG tablet Commonly known as: BUMEX Take 0.5 mg by mouth daily as needed (fluid).   colchicine 0.6 MG tablet Commonly known as: Colcrys Take 1 tablet (0.6 mg total) by mouth 2 (two) times daily. Needs office visit (second notice) What changed:   when to take this  reasons to take this  additional instructions   doxycycline 100 MG tablet Commonly known as: VIBRA-TABS Take 1 tablet (100 mg total) by mouth every 12 (twelve) hours.   folic acid 1 MG tablet Commonly known  as: FOLVITE Take 1 tablet (1 mg total) by mouth daily.   Humira Pen 40 MG/0.4ML Pnkt Generic drug: Adalimumab 0.4 mLs by Subdermal route every 14 (fourteen) days.   insulin aspart 100 UNIT/ML FlexPen Commonly known as: NOVOLOG Inject 3 Units into the skin 3 (three) times daily with meals.   lipase/protease/amylase 78295 UNITS Cpep capsule Commonly known as: CREON Take 1 capsule (36,000 Units total) by mouth 3 (three) times daily before meals.   lisinopril 20 MG tablet Commonly known as: ZESTRIL Take 1 tablet (20 mg total) by mouth daily.   metFORMIN 500 MG 24 hr tablet Commonly known as: GLUCOPHAGE-XR Take 2 tablets (1,000 mg total) by mouth daily with breakfast. What changed: when to take this   metroNIDAZOLE 500 MG tablet Commonly known as: FLAGYL Take 1 tablet (500 mg total) by mouth every 8 (eight) hours.   morphine 15 MG 12 hr tablet Commonly known as: MS CONTIN Take 1 tablet (15 mg total) by mouth every 12 (twelve) hours for 7 days.   pantoprazole 40 MG tablet Commonly known as: PROTONIX Take 1 tablet (40 mg total) by mouth daily.   potassium chloride 10 MEQ tablet Commonly known as: KLOR-CON Take 1 tablet (10 mEq total) by mouth daily. What changed: how much to take            Durable Medical Equipment  (From admission, onward)         Start     Ordered   06/09/19 1523  For home use only DME Walker rolling  Once    Comments: HD  Question Answer Comment  Walker: With 5 Inch Wheels   Patient needs a walker to treat with the following condition Weakness      06/09/19 1522   06/09/19 1522  For home use only DME Bedside commode  Once    Comments: HD  Question:  Patient needs a bedside commode to treat with the following condition  Answer:  Weakness   06/09/19 1522          Consultations:  Gastroenterology  Procedures/Studies: 2/11-EUS-cystogastrostomy tube with placement of double pigtail and removal of 1.2 L of fluid  2/15-removed pigtails   02/17and02/19-EGD 02/22-double pigtails were replaced. 02/26-EGDdouble pigtails stents removed and performed necrosectomy procedure. 03/01-EGD with necrosectomy and double pigtails stents replaced 3/03-EGD with pancreatic necrosectomy and pigtail replacement.  EGD revealed erythematous mucosa in the stomach.   CT ABDOMEN PELVIS W CONTRAST  Result Date: 05/25/2019 CLINICAL DATA:  Inpatient. Follow-up peripancreatic fluid collection in the setting of acute pancreatitis. EXAM: CT ABDOMEN AND PELVIS WITH CONTRAST TECHNIQUE:  Multidetector CT imaging of the abdomen and pelvis was performed using the standard protocol following bolus administration of intravenous contrast. CONTRAST:  OMNIPAQUE IOHEXOL 300 MG/ML  SOLN COMPARISON:  05/13/2019 CT abdomen/pelvis. FINDINGS: Lower chest: Mild bibasilar atelectasis. Hepatobiliary: Normal liver size. No liver mass. Cholecystectomy. No biliary ductal dilatation. CBD diameter 4 mm. Pancreas: Persistent diffuse extensive peripancreatic fat stranding. There is a lesser sac 14.6 x 8.3 cm fluid collection with new air-fluid level, previously 16.5 x 8.9 cm, mildly decreased in size. There is new scattered peripancreatic gas throughout the widespread ill-defined peripancreatic fluid extending throughout the transverse mesocolon and into the root of the mesentery, appearing similar in extent. No discrete pancreatic mass or duct dilation. Spleen: Normal size. No mass. Adrenals/Urinary Tract: Normal adrenals. No hydronephrosis. No renal masses. Normal bladder. Stomach/Bowel: Enteric tube terminates in the distal duodenum near the duodenal jejunal junction. Stomach is collapsed with stable extrinsic mass effect on the stomach from the lesser sac fluid collection. Normal caliber small bowel with no small bowel wall thickening. Normal appendix. Mild wall thickening in the distal transverse colon, splenic flexure of the colon and proximal descending colon, probably reactive.  No large bowel dilatation or diverticulosis. Vascular/Lymphatic: Normal caliber abdominal aorta. Patent portal, splenic, hepatic and renal veins. No pathologically enlarged lymph nodes in the abdomen or pelvis. Reproductive: Top-normal size prostate. Trace pelvic and paracolic gutter ascites. Other: No pneumoperitoneum, ascites or focal fluid collection. Musculoskeletal: No aggressive appearing focal osseous lesions. Mild thoracolumbar spondylosis. IMPRESSION: 1. Large lesser sac fluid collection with new air-fluid level, mildly decreased in size since 05/13/2019 CT. 2. Persistent findings of pancreatitis with new scattered peripancreatic gas throughout the extensive ill-defined peripancreatic fluid extending throughout the transverse mesocolon and into the root of the mesentery, worrisome for necrotizing pancreatitis. 3. Mild wall thickening in the distal transverse colon, splenic flexure of the colon and proximal descending colon, probably reactive. 4. Trace pelvic and paracolic gutter ascites. 5. Enteric tube terminates in the distal duodenum near the duodenal jejunal junction. Electronically Signed   By: Delbert Phenix M.D.   On: 05/25/2019 09:45   DG Abd 2 Views  Result Date: 05/24/2019 CLINICAL DATA:  Abdominal pain. EXAM: ABDOMEN - 2 VIEW COMPARISON:  05/13/2010 and prior studies FINDINGS: A small bore feeding tube is identified with tip overlying the distal duodenum. The bowel gas pattern is unremarkable. No dilated bowel loops are present. No evidence of pneumoperitoneum. IMPRESSION: Unremarkable bowel gas pattern. No evidence of pneumoperitoneum. Electronically Signed   By: Harmon Pier M.D.   On: 05/24/2019 08:04   IR PICC PLACEMENT RIGHT >5 YRS INC IMG GUIDE  Result Date: 06/05/2019 INDICATION: Patient with history of malnutrition, request to IR for PICC placement for TPN. EXAM: RIGHT UPPER EXTREMITY PICC LINE PLACEMENT WITH ULTRASOUND AND FLUOROSCOPIC GUIDANCE MEDICATIONS: 3 mL 1% lidocaine.  ANESTHESIA/SEDATION: None. FLUOROSCOPY TIME:  Fluoroscopy Time: 1 minutes 0 seconds (10.3 mGy). COMPLICATIONS: None immediate. PROCEDURE: The patient was advised of the possible risks and complications and agreed to undergo the procedure. The patient was then brought to the angiographic suite for the procedure. The right arm was prepped with chlorhexidine, draped in the usual sterile fashion using maximum barrier technique (cap and mask, sterile gown, sterile gloves, large sterile sheet, hand hygiene and cutaneous antisepsis) and infiltrated locally with 1% Lidocaine. Ultrasound demonstrated patency of the right brachial vein, and this was documented with an image. Under real-time ultrasound guidance, this vein was accessed with a 21 gauge micropuncture needle and image  documentation was performed. A 0.018 wire was introduced in to the vein. Over this, a 5 [place] double lumen power injectable PICC was advanced to the lower SVC/right atrial junction. Fluoroscopy during the procedure and fluoro spot radiograph confirms appropriate catheter position. The catheter was aspirated, flushed, capped and covered with a sterile dressing. Catheter length: 38 cm IMPRESSION: Successful right arm power PICC line placement with ultrasound and fluoroscopic guidance. The catheter is ready for use. Read by Lynnette Caffey, PA-C Electronically Signed   By: Judie Petit.  Shick M.D.   On: 06/05/2019 12:42   [place] EKG SITE RITE  Result Date: 06/04/2019 If Site Rite image not attached, placement could not be confirmed due to current cardiac rhythm.     Discharge Exam: Vitals:   06/21/19 1950 06/22/19 0807  BP: 116/69 112/76  Pulse: 88 89  Resp:  20  Temp: 97.8 F (36.6 C) 98 F (36.7 C)  SpO2: 99% 100%    GENERAL: No acute distress.  Appears well.  HEENT: MMM.  Vision and hearing grossly intact.  NECK: Supple.  No apparent JVD.  RESP:  No IWOB. Good air movement bilaterally. CVS:  RRR. Heart sounds normal.  ABD/GI/GU: Bowel  sounds present. Soft.  Mild diffuse tenderness. MSK/EXT:  Moves extremities. No apparent deformity.  1+ pitting edema bilaterally. SKIN: no apparent skin lesion or wound NEURO: Awake, alert and oriented appropriately.  No apparent focal neuro deficit. PSYCH: Calm. Normal affect.   The results of significant diagnostics from this hospitalization (including imaging, microbiology, ancillary and laboratory) are listed below for reference.     Microbiology: No results found for this or any previous visit (from the past 240 hour(s)).   Labs: BNP (last 3 results) Recent Labs    04/23/19 0450  BNP 99.2   Basic Metabolic Panel: Recent Labs  Lab 06/18/19 0359 06/19/19 0313 06/20/19 0422 06/21/19 0430 06/22/19 0425  NA 131* 131* 134* 134* 133*  K 4.4 4.0 4.0 3.8 3.8  CL 98 98 100 102 100  CO2 23 25 25 25 24   GLUCOSE 350* 176* 137* 145* 165*  BUN 19 16 16 14 12   CREATININE 0.65 0.59* 0.61 0.61 0.60*  CALCIUM 8.4* 8.5* 8.5* 8.3* 8.5*  MG 1.8 1.7 1.7 1.8 1.7  PHOS 3.0 3.5 4.1 4.1 3.8   Liver Function Tests: Recent Labs  Lab 06/18/19 0359 06/19/19 0313 06/20/19 0422 06/21/19 0430 06/22/19 0425  AST 12*  --   --   --  11*  ALT 11  --   --   --  8  ALKPHOS 78  --   --   --  92  BILITOT 0.4  --   --   --  0.3  PROT 7.4  --   --   --  7.2  ALBUMIN 1.9* 2.0* 2.1* 1.9* 2.2*   No results for input(s): LIPASE, AMYLASE in the last 168 hours. No results for input(s): AMMONIA in the last 168 hours. CBC: Recent Labs  Lab 06/18/19 0359 06/18/19 0359 06/19/19 0314 06/20/19 0422 06/21/19 0430 06/21/19 1203 06/22/19 0425  WBC 4.5  --  6.4 7.8 5.5  --  6.9  NEUTROABS  --   --   --   --   --   --  3.8  HGB 8.3*   < > 8.8* 8.5* 7.7* 8.5* 8.1*  HCT 27.2*   < > 29.2* 28.7* 25.3* 27.4* 26.7*  MCV 85.3  --  84.1 84.9 83.8  --  83.2  PLT  218  --  255 254 221  --  240   < > = values in this interval not displayed.   Cardiac Enzymes: No results for input(s): CKTOTAL, CKMB,  CKMBINDEX, TROPONINI in the last 168 hours. BNP: Invalid input(s): POCBNP CBG: Recent Labs  Lab 06/21/19 0601 06/21/19 1057 06/21/19 1637 06/21/19 2104 06/22/19 0609  GLUCAP 138* 172* 175* 202* 161*   D-Dimer No results for input(s): DDIMER in the last 72 hours. Hgb A1c No results for input(s): HGBA1C in the last 72 hours. Lipid Profile Recent Labs    06/22/19 0425  TRIG 105   Thyroid function studies No results for input(s): TSH, T4TOTAL, T3FREE, THYROIDAB in the last 72 hours.  Invalid input(s): FREET3 Anemia work up Recent Labs    06/21/19 1203  VITAMINB12 628  FOLATE 5.9*  FERRITIN 340*  TIBC 223*  IRON 65  RETICCTPCT 1.2   Urinalysis    Component Value Date/Time   COLORURINE YELLOW 04/20/2019 1708   APPEARANCEUR CLEAR 04/20/2019 1708   LABSPEC 1.022 04/20/2019 1708   PHURINE 5.0 04/20/2019 1708   GLUCOSEU >=500 (A) 04/20/2019 1708   HGBUR SMALL (A) 04/20/2019 1708   BILIRUBINUR NEGATIVE 04/20/2019 1708   KETONESUR 5 (A) 04/20/2019 1708   PROTEINUR NEGATIVE 04/20/2019 1708   UROBILINOGEN 0.2 09/12/2018 0949   NITRITE NEGATIVE 04/20/2019 1708   LEUKOCYTESUR NEGATIVE 04/20/2019 1708   Sepsis Labs Invalid input(s): PROCALCITONIN,  WBC,  LACTICIDVEN   Time coordinating discharge: 45 minutes  SIGNED:  Mercy Riding, MD  [facility] 06/22/2019, 4:00 PM  If 7PM-7AM, please contact night-coverage [email or web address] Password [email or web address]

## 2019-06-22 NOTE — TOC Transition Note (Signed)
Transition of Care [facility]) - CM/SW Discharge Note   Patient Details  Name: Caleb Chambers MRN: [id number] Date of Birth: 09-Sep-1965  [facility] CM/SW Contact:  Leone Haven, RN Phone Number: 06/22/2019, 9:27 AM   Clinical Narrative:    Patient is for dc today, Jeri Modena is on her way to hospital to do teaching with patient with the TPN.  NCM notified Kandee Keen with [facility] that patient is for dc today.     Final next level of care: Home w Home Health Services Barriers to Discharge: No Barriers Identified   Patient Goals and CMS Choice Patient states their goals for this hospitalization and ongoing recovery are:: home with [facility] CMS Medicare.gov Compare Post Acute Care list provided to:: Patient Choice offered to / list presented to : Patient  Discharge Placement                       Discharge Plan and Services In-house Referral: NA Discharge Planning Services: CM Consult Post Acute Care Choice: Home Health, Durable Medical Equipment          DME Arranged: Walker rolling, Bedside commode DME Agency: [employer] Date DME Agency Contacted: 06/09/19 Time DME Agency Contacted: 1300 Representative spoke with at DME Agency: Ian Malkin HH Arranged: RN HH Agency: [facility] Date [facility] Agency Contacted: 06/12/19 Time HH Agency Contacted: 1552 Representative spoke with at [facility] Agency: Kandee Keen  Social Determinants of Health (SDOH) Interventions     Readmission Risk Interventions Readmission Risk Prevention Plan 06/09/2019  Transportation Screening Complete  Medication Review [employer]) Complete  SW Recovery Care/Counseling Consult Complete  Palliative Care Screening Not Applicable  Skilled Nursing Facility Patient Refused  Some recent data might be hidden

## 2019-06-27 ENCOUNTER — Inpatient Hospital Stay (HOSPITAL_COMMUNITY)
Admission: EM | Admit: 2019-06-27 | Discharge: 2019-07-09 | DRG: 439 | Disposition: A | Discharge status: Home Health | Payer: BC Managed Care – PPO | Attending: Family Medicine | Admitting: Family Medicine

## 2019-06-27 ENCOUNTER — Emergency Department (HOSPITAL_COMMUNITY): Payer: BC Managed Care – PPO

## 2019-06-27 ENCOUNTER — Other Ambulatory Visit: Payer: Self-pay

## 2019-06-27 ENCOUNTER — Telehealth: Payer: Self-pay | Admitting: Gastroenterology

## 2019-06-27 DIAGNOSIS — K8689 Other specified diseases of pancreas: Secondary | ICD-10-CM | POA: Diagnosis present

## 2019-06-27 DIAGNOSIS — Z881 Allergy status to other antibiotic agents status: Secondary | ICD-10-CM | POA: Diagnosis not present

## 2019-06-27 DIAGNOSIS — E43 Unspecified severe protein-calorie malnutrition: Secondary | ICD-10-CM | POA: Diagnosis present

## 2019-06-27 DIAGNOSIS — K861 Other chronic pancreatitis: Secondary | ICD-10-CM | POA: Diagnosis present

## 2019-06-27 DIAGNOSIS — K863 Pseudocyst of pancreas: Secondary | ICD-10-CM | POA: Diagnosis present

## 2019-06-27 DIAGNOSIS — R1011 Right upper quadrant pain: Secondary | ICD-10-CM | POA: Diagnosis present

## 2019-06-27 DIAGNOSIS — E871 Hypo-osmolality and hyponatremia: Secondary | ICD-10-CM | POA: Diagnosis present

## 2019-06-27 DIAGNOSIS — K760 Fatty (change of) liver, not elsewhere classified: Secondary | ICD-10-CM | POA: Diagnosis present

## 2019-06-27 DIAGNOSIS — R109 Unspecified abdominal pain: Secondary | ICD-10-CM | POA: Diagnosis present

## 2019-06-27 DIAGNOSIS — E669 Obesity, unspecified: Secondary | ICD-10-CM | POA: Diagnosis not present

## 2019-06-27 DIAGNOSIS — I700 Atherosclerosis of aorta: Secondary | ICD-10-CM | POA: Diagnosis present

## 2019-06-27 DIAGNOSIS — R1084 Generalized abdominal pain: Secondary | ICD-10-CM

## 2019-06-27 DIAGNOSIS — I10 Essential (primary) hypertension: Secondary | ICD-10-CM | POA: Diagnosis present

## 2019-06-27 DIAGNOSIS — Z20822 Contact with and (suspected) exposure to covid-19: Secondary | ICD-10-CM | POA: Diagnosis present

## 2019-06-27 DIAGNOSIS — Z6841 Body Mass Index (BMI) 40.0 and over, adult: Secondary | ICD-10-CM

## 2019-06-27 DIAGNOSIS — M109 Gout, unspecified: Secondary | ICD-10-CM | POA: Diagnosis present

## 2019-06-27 DIAGNOSIS — K219 Gastro-esophageal reflux disease without esophagitis: Secondary | ICD-10-CM | POA: Diagnosis present

## 2019-06-27 DIAGNOSIS — R112 Nausea with vomiting, unspecified: Secondary | ICD-10-CM | POA: Diagnosis not present

## 2019-06-27 DIAGNOSIS — R52 Pain, unspecified: Secondary | ICD-10-CM

## 2019-06-27 DIAGNOSIS — Z8249 Family history of ischemic heart disease and other diseases of the circulatory system: Secondary | ICD-10-CM | POA: Diagnosis not present

## 2019-06-27 DIAGNOSIS — D649 Anemia, unspecified: Secondary | ICD-10-CM | POA: Diagnosis present

## 2019-06-27 DIAGNOSIS — F419 Anxiety disorder, unspecified: Secondary | ICD-10-CM | POA: Diagnosis not present

## 2019-06-27 DIAGNOSIS — R609 Edema, unspecified: Secondary | ICD-10-CM | POA: Diagnosis not present

## 2019-06-27 DIAGNOSIS — R197 Diarrhea, unspecified: Secondary | ICD-10-CM

## 2019-06-27 DIAGNOSIS — Z880 Allergy status to penicillin: Secondary | ICD-10-CM | POA: Diagnosis not present

## 2019-06-27 DIAGNOSIS — R935 Abnormal findings on diagnostic imaging of other abdominal regions, including retroperitoneum: Secondary | ICD-10-CM | POA: Diagnosis present

## 2019-06-27 DIAGNOSIS — Z452 Encounter for adjustment and management of vascular access device: Secondary | ICD-10-CM

## 2019-06-27 DIAGNOSIS — Z794 Long term (current) use of insulin: Secondary | ICD-10-CM | POA: Diagnosis not present

## 2019-06-27 DIAGNOSIS — E11649 Type 2 diabetes mellitus with hypoglycemia without coma: Secondary | ICD-10-CM | POA: Diagnosis present

## 2019-06-27 DIAGNOSIS — E876 Hypokalemia: Secondary | ICD-10-CM | POA: Diagnosis not present

## 2019-06-27 DIAGNOSIS — Z888 Allergy status to other drugs, medicaments and biological substances status: Secondary | ICD-10-CM

## 2019-06-27 DIAGNOSIS — E860 Dehydration: Secondary | ICD-10-CM | POA: Diagnosis present

## 2019-06-27 DIAGNOSIS — R1013 Epigastric pain: Secondary | ICD-10-CM | POA: Diagnosis present

## 2019-06-27 DIAGNOSIS — K8591 Acute pancreatitis with uninfected necrosis, unspecified: Secondary | ICD-10-CM | POA: Diagnosis present

## 2019-06-27 DIAGNOSIS — E1169 Type 2 diabetes mellitus with other specified complication: Secondary | ICD-10-CM | POA: Diagnosis not present

## 2019-06-27 DIAGNOSIS — E440 Moderate protein-calorie malnutrition: Secondary | ICD-10-CM | POA: Insufficient documentation

## 2019-06-27 LAB — CBC WITH DIFFERENTIAL/PLATELET
Abs Immature Granulocytes: 0.06 10*3/uL (ref 0.00–0.07)
Basophils Absolute: 0 10*3/uL (ref 0.0–0.1)
Basophils Relative: 0 %
Eosinophils Absolute: 0 10*3/uL (ref 0.0–0.5)
Eosinophils Relative: 0 %
HCT: 31.4 % — ABNORMAL LOW (ref 39.0–52.0)
Hemoglobin: 9.1 g/dL — ABNORMAL LOW (ref 13.0–17.0)
Immature Granulocytes: 1 %
Lymphocytes Relative: 8 %
Lymphs Abs: 0.7 10*3/uL (ref 0.7–4.0)
MCH: 25.2 pg — ABNORMAL LOW (ref 26.0–34.0)
MCHC: 29 g/dL — ABNORMAL LOW (ref 30.0–36.0)
MCV: 87 fL (ref 80.0–100.0)
Monocytes Absolute: 0.2 10*3/uL (ref 0.1–1.0)
Monocytes Relative: 2 %
Neutro Abs: 8.3 10*3/uL — ABNORMAL HIGH (ref 1.7–7.7)
Neutrophils Relative %: 89 %
Platelets: 230 10*3/uL (ref 150–400)
RBC: 3.61 MIL/uL — ABNORMAL LOW (ref 4.22–5.81)
RDW: 18 % — ABNORMAL HIGH (ref 11.5–15.5)
WBC: 9.3 10*3/uL (ref 4.0–10.5)
nRBC: 0 % (ref 0.0–0.2)

## 2019-06-27 LAB — COMPREHENSIVE METABOLIC PANEL
ALT: 9 U/L (ref 0–44)
AST: 11 U/L — ABNORMAL LOW (ref 15–41)
Albumin: 2.6 g/dL — ABNORMAL LOW (ref 3.5–5.0)
Alkaline Phosphatase: 99 U/L (ref 38–126)
Anion gap: 13 (ref 5–15)
BUN: 10 mg/dL (ref 6–20)
CO2: 18 mmol/L — ABNORMAL LOW (ref 22–32)
Calcium: 8.6 mg/dL — ABNORMAL LOW (ref 8.9–10.3)
Chloride: 100 mmol/L (ref 98–111)
Creatinine, Ser: 0.63 mg/dL (ref 0.61–1.24)
GFR calc Af Amer: >60 mL/min (ref >60)
GFR calc non Af Amer: >60 mL/min (ref >60)
Glucose, Bld: 416 mg/dL — ABNORMAL HIGH (ref 70–99)
Potassium: 3.8 mmol/L (ref 3.5–5.1)
Sodium: 131 mmol/L — ABNORMAL LOW (ref 135–145)
Total Bilirubin: 0.5 mg/dL (ref 0.3–1.2)
Total Protein: 7.4 g/dL (ref 6.5–8.1)

## 2019-06-27 LAB — CBG MONITORING, ED
Glucose-Capillary: 358 mg/dL — ABNORMAL HIGH (ref 70–99)
Glucose-Capillary: 347 mg/dL — ABNORMAL HIGH (ref 70–99)

## 2019-06-27 LAB — LIPASE, BLOOD: Lipase: 30 U/L (ref 11–51)

## 2019-06-27 LAB — POCT I-STAT EG7
Bicarbonate: 24.3 mmol/L (ref 20.0–28.0)
Calcium, Ion: 1.19 mmol/L (ref 1.15–1.40)
HCT: 29 % — ABNORMAL LOW (ref 39.0–52.0)
Hemoglobin: 9.9 g/dL — ABNORMAL LOW (ref 13.0–17.0)
O2 Saturation: 99 %
Potassium: 3.9 mmol/L (ref 3.5–5.1)
Sodium: 134 mmol/L — ABNORMAL LOW (ref 135–145)
TCO2: 25 mmol/L (ref 22–32)
pCO2, Ven: 38 mmHg — ABNORMAL LOW (ref 44.0–60.0)
pH, Ven: 7.415 (ref 7.250–7.430)
pO2, Ven: 148 mmHg — ABNORMAL HIGH (ref 32.0–45.0)

## 2019-06-27 LAB — LACTIC ACID, PLASMA: Lactic Acid, Venous: 1.2 mmol/L (ref 0.5–1.9)

## 2019-06-27 LAB — MAGNESIUM: Magnesium: 1.7 mg/dL (ref 1.7–2.4)

## 2019-06-27 LAB — GLUCOSE, CAPILLARY: Glucose-Capillary: 339 mg/dL — ABNORMAL HIGH (ref 70–99)

## 2019-06-27 MED ORDER — ACETAMINOPHEN 325 MG PO TABS
650.0000 mg | ORAL_TABLET | Freq: Four times a day (QID) | ORAL | Status: DC | PRN
  Administered 2019-07-07: 650 mg via ORAL
  Filled 2019-06-27 (×2): qty 2

## 2019-06-27 MED ORDER — ONDANSETRON HCL 4 MG/2ML IJ SOLN
4.0000 mg | Freq: Four times a day (QID) | INTRAMUSCULAR | Status: DC | PRN
  Administered 2019-06-28 – 2019-07-07 (×6): 4 mg via INTRAVENOUS
  Filled 2019-06-27 (×6): qty 2

## 2019-06-27 MED ORDER — ONDANSETRON HCL 4 MG PO TABS
4.0000 mg | ORAL_TABLET | Freq: Four times a day (QID) | ORAL | Status: DC | PRN
  Administered 2019-07-02 – 2019-07-06 (×2): 4 mg via ORAL
  Filled 2019-06-27 (×2): qty 1

## 2019-06-27 MED ORDER — SODIUM CHLORIDE 0.9 % IV SOLN: INTRAVENOUS | Status: DC

## 2019-06-27 MED ORDER — ENOXAPARIN SODIUM 80 MG/0.8ML ~~LOC~~ SOLN
0.5000 mg/kg | SUBCUTANEOUS | Status: DC
  Administered 2019-06-27 – 2019-07-08 (×12): 65 mg via SUBCUTANEOUS
  Filled 2019-06-27 (×14): qty 0.65

## 2019-06-27 MED ORDER — IOHEXOL 300 MG/ML  SOLN
100.0000 mL | Freq: Once | INTRAMUSCULAR | Status: AC | PRN
  Administered 2019-06-27: 100 mL via INTRAVENOUS

## 2019-06-27 MED ORDER — INSULIN ASPART 100 UNIT/ML ~~LOC~~ SOLN
0.0000 [IU] | SUBCUTANEOUS | Status: DC
  Administered 2019-06-27: 7 [IU] via SUBCUTANEOUS
  Administered 2019-06-28: 05:00:00 2 [IU] via SUBCUTANEOUS
  Administered 2019-06-28: 01:00:00 3 [IU] via SUBCUTANEOUS
  Administered 2019-06-28: 1 [IU] via SUBCUTANEOUS

## 2019-06-27 MED ORDER — ALLOPURINOL 300 MG PO TABS
300.0000 mg | ORAL_TABLET | Freq: Every day | ORAL | Status: DC
  Administered 2019-06-27 – 2019-07-09 (×12): 300 mg via ORAL
  Filled 2019-06-27 (×13): qty 1

## 2019-06-27 MED ORDER — HYDROMORPHONE HCL 1 MG/ML IJ SOLN
1.0000 mg | Freq: Once | INTRAMUSCULAR | Status: AC
  Administered 2019-06-27: 1 mg via INTRAVENOUS
  Filled 2019-06-27: qty 1

## 2019-06-27 MED ORDER — VANCOMYCIN HCL 1500 MG/300ML IV SOLN
1500.0000 mg | Freq: Two times a day (BID) | INTRAVENOUS | Status: DC
  Administered 2019-06-27 – 2019-06-28 (×3): 1500 mg via INTRAVENOUS
  Filled 2019-06-27 (×5): qty 300

## 2019-06-27 MED ORDER — PANCRELIPASE (LIP-PROT-AMYL) 36000-114000 UNITS PO CPEP
36000.0000 [IU] | ORAL_CAPSULE | Freq: Three times a day (TID) | ORAL | Status: DC
  Administered 2019-06-28 – 2019-07-06 (×20): 36000 [IU] via ORAL
  Filled 2019-06-27 (×29): qty 1

## 2019-06-27 MED ORDER — PROMETHAZINE HCL 25 MG/ML IJ SOLN
25.0000 mg | Freq: Once | INTRAMUSCULAR | Status: AC
  Administered 2019-06-27: 25 mg via INTRAVENOUS

## 2019-06-27 MED ORDER — SODIUM CHLORIDE 0.9 % IV SOLN
2.0000 g | INTRAVENOUS | Status: DC
  Administered 2019-06-27 – 2019-06-28 (×2): 2 g via INTRAVENOUS
  Filled 2019-06-27 (×2): qty 20

## 2019-06-27 MED ORDER — METRONIDAZOLE IN NACL 5-0.79 MG/ML-% IV SOLN
500.0000 mg | Freq: Three times a day (TID) | INTRAVENOUS | Status: DC
  Administered 2019-06-28 – 2019-07-09 (×34): 500 mg via INTRAVENOUS
  Filled 2019-06-27 (×34): qty 100

## 2019-06-27 MED ORDER — ACETAMINOPHEN 650 MG RE SUPP: 650.0000 mg | Freq: Four times a day (QID) | RECTAL | Status: DC | PRN

## 2019-06-27 MED ORDER — PANTOPRAZOLE SODIUM 40 MG PO TBEC
40.0000 mg | DELAYED_RELEASE_TABLET | Freq: Every day | ORAL | Status: DC
  Administered 2019-06-27 – 2019-06-28 (×2): 40 mg via ORAL
  Filled 2019-06-27 (×3): qty 1

## 2019-06-27 MED ORDER — SODIUM CHLORIDE 0.9 % IV BOLUS
1000.0000 mL | Freq: Once | INTRAVENOUS | Status: AC
  Administered 2019-06-27: 1000 mL via INTRAVENOUS

## 2019-06-27 MED ORDER — SODIUM CHLORIDE 0.9 % IV BOLUS
1000.0000 mL | Freq: Once | INTRAVENOUS | Status: AC
  Administered 2019-06-27: 19:00:00 1000 mL via INTRAVENOUS

## 2019-06-27 MED ORDER — OXYCODONE HCL 5 MG PO TABS
5.0000 mg | ORAL_TABLET | ORAL | Status: DC | PRN
  Administered 2019-06-28: 5 mg via ORAL
  Filled 2019-06-27 (×2): qty 1

## 2019-06-27 MED ORDER — FOLIC ACID 1 MG PO TABS
1.0000 mg | ORAL_TABLET | Freq: Every day | ORAL | Status: DC
  Administered 2019-06-27 – 2019-06-29 (×3): 1 mg via ORAL
  Filled 2019-06-27 (×3): qty 1

## 2019-06-27 MED ORDER — INSULIN GLARGINE 100 UNIT/ML ~~LOC~~ SOLN
11.0000 [IU] | Freq: Every day | SUBCUTANEOUS | Status: DC
  Administered 2019-06-27 – 2019-06-28 (×2): 11 [IU] via SUBCUTANEOUS
  Filled 2019-06-27 (×3): qty 0.11

## 2019-06-27 MED ORDER — FENTANYL CITRATE (PF) 100 MCG/2ML IJ SOLN
25.0000 ug | INTRAMUSCULAR | Status: DC | PRN
  Administered 2019-06-28 – 2019-06-29 (×4): 25 ug via INTRAVENOUS
  Filled 2019-06-27 (×4): qty 2

## 2019-06-27 NOTE — Telephone Encounter (Signed)
Chronic pancreatitis with abdominal pain. Recently hospitalized with acute pancreatitis, necrosis, infected pseudocyst s/p cyst gastrostomy  C/o worsening abd pain and is requesting refill for pain medications (morphine/oxycodone)  Advised patient to come to ER if abdominal pain is significantly worse for evaluation but if symptoms are stable, will need to call Primary GI office during business hours for refill of pain medications

## 2019-06-27 NOTE — ED Notes (Signed)
IV team consulted by phone to clarify PICC protocol. Based on this conversation, attempted to access PICC for blood draw, pt PICC not flushing as expected despite confirmed xray placement. Old blood noted in lumen. Able to draw back 7cc blood from 2nd lumen but patency in question. IV team consulted prior to using line for medication/blood draw. Phlebotomy unable to draw x1, EMS reports attempting IVx3. Will delay additional attempts until after IV consult complete

## 2019-06-27 NOTE — H&P (Signed)
History and Physical    Caleb Chambers XTG:[id number] DOB: Mar 23, 1966 DOA: 06/27/2019  PCP: Patient, No Pcp Per   Patient coming from: Home  Chief Complaint: Diarrhea, abdominal pain, nausea and vomiting  HPI: Caleb Chambers is a 54 y.o. male with medical history significant for but not limited history of uncontrolled diabetes mellitus type 2 with a history of DKA, gout, hypertension, morbid obesity, pancreatitis, history of noncompliance who was recently just discharged from the hospital on 06/22/2018 where is found and admitted for diabetic ketoacidosis, AKI and acute pancreatitis with pseudocyst.  During that hospitalization he underwent multiple EGDs with cystogastrostomy tube creation and double-pigtail replacements.  He underwent multiple EGDs as well as necrosectomy treated for his acute pancreatitis with necrosis and infected pseudocyst with staph aureus.  He was placed on vancomycin and Flagyl and transition to doxycycline metronidazole at discharge.  Plan was for him to new antibiotics until repeat CT of the abdomen and pelvis in 2 weeks.  Patient was on TPN during that hospitalization and it was reduced to half the dose.  He now presents again diarrhea, nausea, vomiting and 10 out of 10 abdominal pain which was initially dull and stabbing and now is just throbbing.  Patient states that he woke up with diarrhea around 2 AM and had multiple episodes.  Tried to maintain his fluid balance and started vomiting and started having abdominal pain.  Was unable to keep anything down.  He called the gastroenterology office and he was advised to come to the ED for further evaluation recommendations.  In the ED he had a CT of the abdomen pelvis done and he had some abdominal pain that was radiating to the left.  Repeat CT of the abdomen pelvis was done and showed that there is still some residual persistent collections that were definitely not drained by the catheters adjacent to the uncinate process.   Patient clinically is not septic appearing but is complaining of significant abdominal pain.  Because of his uncontrolled symptoms he will be brought into the hospital for further evaluation and will be consulted for further recommendations.  ED Course: In the ED had basic blood work done as well as a CT of the abdomen and pelvis.  He was given a liter of normal saline and given pain control with hydromorphone and antiemetics with promethazine.  I started the patient on antibiotics with vancomycin, ceftriaxone and Flagyl and ordered blood cultures.  I asked the EDP to call GI and she is going to call the gastroenterologist on call for further evaluation.  Review of Systems: As per HPI otherwise all other systems reviewed and negative.   Past Medical History:  Diagnosis Date  . Diabetes mellitus without complication ([place])   . DKA (diabetic ketoacidoses) ([place]) 05/12/2019  . Gout   . Hypertension   . Pancreatitis    Past Surgical History:  Procedure Laterality Date  . BILIARY STENT PLACEMENT  05/28/2019   Procedure: BILIARY STENT PLACEMENT;  Surgeon: Rush Landmark Telford Nab., MD;  Location: [place];  Service: Gastroenterology;;  plastic double pig tail stents placed through cystgastrostomy x2  . CHOLECYSTECTOMY    . ENDOROTOR N/A 06/01/2019   Procedure: [id number];  Surgeon: Mansouraty, Telford Nab., MD;  Location: [place];  Service: Gastroenterology;  Laterality: N/A;  . ENDOROTOR N/A 06/03/2019   Procedure: [id number];  Surgeon: Mansouraty, Telford Nab., MD;  Location: [place];  Service: Gastroenterology;  Laterality: N/A;  . ENDOROTOR N/A 06/05/2019   Procedure: [id number];  Surgeon: Mansouraty,  Netty [place]ring., MD;  Location: [facility] ENDOSCOPY;  Service: Gastroenterology;  Laterality: N/A;  . ENDOROTOR  06/08/2019   Procedure: [id number];  Surgeon: Lemar Lofty., MD;  Location: [facility] ENDOSCOPY;  Service: Gastroenterology;;  . Sloan Leiter N/A 06/12/2019   Procedure: [id number];  Surgeon:  Mansouraty, Netty [place]., MD;  Location: [facility] ENDOSCOPY;  Service: Gastroenterology;  Laterality: N/A;  . ENDOROTOR N/A 06/15/2019   Procedure: [id number];  Surgeon: Mansouraty, Netty [place]ring., MD;  Location: [facility] ENDOSCOPY;  Service: Gastroenterology;  Laterality: N/A;  . ENDOROTOR N/A 06/17/2019   Procedure: [id number];  Surgeon: Mansouraty, Netty [place]ring., MD;  Location: [facility] ENDOSCOPY;  Service: Gastroenterology;  Laterality: N/A;  . ESOPHAGOGASTRODUODENOSCOPY N/A 06/01/2019   Procedure: ESOPHAGOGASTRODUODENOSCOPY (EGD) with NECROSECTOMY;  Surgeon: Mansouraty, Netty [place]ring., MD;  Location: [facility] ENDOSCOPY;  Service: Gastroenterology;  Laterality: N/A;  . ESOPHAGOGASTRODUODENOSCOPY Bilateral 06/03/2019   Procedure: ESOPHAGOGASTRODUODENOSCOPY (EGD) with Necrosectomy;  Surgeon: Meridee Score Netty [place]ring., MD;  Location: [facility] ENDOSCOPY;  Service: Gastroenterology;  Laterality: Bilateral;  . ESOPHAGOGASTRODUODENOSCOPY N/A 06/05/2019   Procedure: ESOPHAGOGASTRODUODENOSCOPY (EGD) with Necrosectomy;  Surgeon: Meridee Score Netty [place]ring., MD;  Location: [facility] ENDOSCOPY;  Service: Gastroenterology;  Laterality: N/A;  . ESOPHAGOGASTRODUODENOSCOPY N/A 06/08/2019   Procedure: ESOPHAGOGASTRODUODENOSCOPY (EGD) with NECROSECTOMY;  Surgeon: Mansouraty, Netty [place]ring., MD;  Location: [facility] ENDOSCOPY;  Service: Gastroenterology;  Laterality: N/A;  . ESOPHAGOGASTRODUODENOSCOPY N/A 06/12/2019   Procedure: ESOPHAGOGASTRODUODENOSCOPY (EGD) with NECROSECTOMY;  Surgeon: Mansouraty, Netty [place]ring., MD;  Location: [facility] ENDOSCOPY;  Service: Gastroenterology;  Laterality: N/A;  . ESOPHAGOGASTRODUODENOSCOPY N/A 06/15/2019   Procedure: ESOPHAGOGASTRODUODENOSCOPY (EGD) with NECROSECTOMY;  Surgeon: Mansouraty, Netty [place]ring., MD;  Location: [facility] ENDOSCOPY;  Service: Gastroenterology;  Laterality: N/A;  . ESOPHAGOGASTRODUODENOSCOPY N/A 06/17/2019   Procedure: ESOPHAGOGASTRODUODENOSCOPY (EGD) with NECROSECTOMY;  Surgeon: Mansouraty, Netty [place]ring., MD;  Location: [facility] ENDOSCOPY;  Service:  Gastroenterology;  Laterality: N/A;  . ESOPHAGOGASTRODUODENOSCOPY (EGD) WITH PROPOFOL N/A 05/28/2019   Procedure: ESOPHAGOGASTRODUODENOSCOPY (EGD) WITH PROPOFOL;  Surgeon: Meridee Score Netty [place]ring., MD;  Location: [facility] ENDOSCOPY;  Service: Gastroenterology;  Laterality: N/A;  . EUS Left 05/28/2019   Procedure: UPPER ENDOSCOPIC ULTRASOUND (EUS) LINEAR;  Surgeon: Lemar Lofty., MD;  Location: [facility] ENDOSCOPY;  Service: Gastroenterology;  Laterality: Left;  . PANCREATIC STENT PLACEMENT  05/28/2019   Procedure: PANCREATIC STENT PLACEMENT;  Surgeon: Meridee Score Netty [place]ring., MD;  Location: [facility] ENDOSCOPY;  Service: Gastroenterology;;  cystgastrostomy with axiod stent placement  . PANCREATIC STENT PLACEMENT  06/01/2019   Procedure: PANCREATIC STENT PLACEMENT;  Surgeon: Meridee Score Netty [place]ring., MD;  Location: [facility] ENDOSCOPY;  Service: Gastroenterology;;  . PANCREATIC STENT PLACEMENT  06/03/2019   Procedure: PANCREATIC STENT PLACEMENT;  Surgeon: Lemar Lofty., MD;  Location: [facility] ENDOSCOPY;  Service: Gastroenterology;;  double pig tail stents placed through cystgastrostomy  . PANCREATIC STENT PLACEMENT  06/05/2019   Procedure: PANCREATIC STENT PLACEMENT;  Surgeon: Meridee Score Netty [place]ring., MD;  Location: [facility] ENDOSCOPY;  Service: Gastroenterology;;  . PANCREATIC STENT PLACEMENT  06/08/2019   Procedure: PANCREATIC STENT PLACEMENT;  Surgeon: Lemar Lofty., MD;  Location: [facility] ENDOSCOPY;  Service: Gastroenterology;;  double pigtail cyst gastrostomy   . PANCREATIC STENT PLACEMENT  06/12/2019   Procedure: PANCREATIC STENT PLACEMENT;  Surgeon: Meridee Score Netty [place]ring., MD;  Location: [facility] ENDOSCOPY;  Service: Gastroenterology;;  double pigtail stents placed through cystgastrostomy  . PANCREATIC STENT PLACEMENT  06/15/2019   Procedure: PANCREATIC STENT PLACEMENT;  Surgeon: Meridee Score Netty [place]ring., MD;  Location: [facility] ENDOSCOPY;  Service: Gastroenterology;;  . PANCREATIC STENT PLACEMENT  06/17/2019   Procedure:  PANCREATIC STENT PLACEMENT;  Surgeon: Lemar Lofty., MD;  Location: [facility]  ENDOSCOPY;  Service: Gastroenterology;;  . Francine Graven REMOVAL  06/01/2019   Procedure: STENT REMOVAL;  Surgeon: Lemar Lofty., MD;  Location: [facility] ENDOSCOPY;  Service: Gastroenterology;;  . Francine Graven REMOVAL  06/03/2019   Procedure: STENT REMOVAL;  Surgeon: Lemar Lofty., MD;  Location: [facility] ENDOSCOPY;  Service: Gastroenterology;;  . Francine Graven REMOVAL  06/05/2019   Procedure: STENT REMOVAL;  Surgeon: Lemar Lofty., MD;  Location: [facility] ENDOSCOPY;  Service: Gastroenterology;;  . Francine Graven REMOVAL  06/08/2019   Procedure: STENT REMOVAL;  Surgeon: Lemar Lofty., MD;  Location: [facility] ENDOSCOPY;  Service: Gastroenterology;;  . Francine Graven REMOVAL  06/12/2019   Procedure: STENT REMOVAL;  Surgeon: Lemar Lofty., MD;  Location: [facility] ENDOSCOPY;  Service: Gastroenterology;;  . Francine Graven REMOVAL  06/15/2019   Procedure: STENT REMOVAL;  Surgeon: Lemar Lofty., MD;  Location: [facility] ENDOSCOPY;  Service: Gastroenterology;;  . Francine Graven REMOVAL  06/17/2019   Procedure: STENT REMOVAL;  Surgeon: Lemar Lofty., MD;  Location: [facility] ENDOSCOPY;  Service: Gastroenterology;;   SOCIAL HISTORY  reports that he has never smoked. He has never used smokeless tobacco. He reports previous alcohol use. No history on file for drug.  Allergies  Allergen Reactions  . Penicillins Anaphylaxis, Hives, Swelling and Rash    Spoke with patient and he reported in his 7s he had a PCN (unsure which specific drug) and developed a rash with SOB. Reported no PCNs since.  Did it involve swelling of the face/tongue/throat, SOB, or low BP? Unknown Did it involve sudden or severe rash/hives, skin peeling, or any reaction on the inside of your mouth or nose? Unknown Did you need to seek medical attention at a hospital or doctor's office? Unknown When did it last happen?mid 20's (age) If all above answers are "NO", may proc  . Metoprolol  Other (See Comments)    Chest pains - reported by [facility] and  [facility] 02/08/14  . Clindamycin Rash and Other (See Comments)     reported by [facility] and [facility] 09-30-13   Family History  Problem Relation Age of Onset  . Healthy Mother   . Hypertension Father    Prior to Admission medications   Medication Sig Start Date End Date Taking? Authorizing Provider  allopurinol (ZYLOPRIM) 300 MG tablet Take 300 mg by mouth daily.     [provider]  amLODipine (NORVASC) 10 MG tablet Take 10 mg by mouth daily. 11/30/16   [provider]  bumetanide (BUMEX) 0.5 MG tablet Take 0.5 mg by mouth daily as needed (fluid).     [provider]  colchicine (COLCRYS) 0.6 MG tablet Take 1 tablet (0.6 mg total) by mouth 2 (two) times daily. Needs office visit (second notice) Patient taking differently: Take 0.6 mg by mouth daily as needed (gout flare up).  10/30/11   Nelva Nay, PA-C  doxycycline (VIBRA-TABS) 100 MG tablet Take 1 tablet (100 mg total) by mouth every 12 (twelve) hours. 06/22/19   Almon Hercules, MD  folic acid (FOLVITE) 1 MG tablet Take 1 tablet (1 mg total) by mouth daily. 06/22/19   Almon Hercules, MD  HUMIRA PEN 40 MG/0.4ML PNKT 0.4 mLs by Subdermal route every 14 (fourteen) days. 02/09/19   [provider]  insulin aspart (NOVOLOG) 100 UNIT/ML FlexPen Inject 3 Units into the skin 3 (three) times daily with meals. 06/22/19   Almon Hercules, MD  lipase/protease/amylase (CREON) 36000 UNITS CPEP capsule Take 1 capsule (36,000 Units total) by mouth 3 (three) times  daily before meals. 06/22/19   Almon Hercules, MD  lisinopril (ZESTRIL) 20 MG tablet Take 1 tablet (20 mg total) by mouth daily. 06/22/19   Almon Hercules, MD  metFORMIN (GLUCOPHAGE-XR) 500 MG 24 hr tablet Take 2 tablets (1,000 mg total) by mouth daily with breakfast. 06/22/19   Almon Hercules, MD  metroNIDAZOLE (FLAGYL) 500 MG tablet Take 1 tablet (500 mg total) by mouth every 8 (eight) hours. 06/22/19   Almon Hercules,  MD  morphine (MS CONTIN) 15 MG 12 hr tablet Take 1 tablet (15 mg total) by mouth every 12 (twelve) hours for 7 days. 06/22/19 06/29/19  Almon Hercules, MD  pantoprazole (PROTONIX) 40 MG tablet Take 1 tablet (40 mg total) by mouth daily. 06/22/19   Almon Hercules, MD  potassium chloride (KLOR-CON) 10 MEQ tablet Take 1 tablet (10 mEq total) by mouth daily. 06/22/19   Almon Hercules, MD    Physical Exam: Vitals:   06/27/19 1515 06/27/19 1715 06/27/19 1800 06/27/19 1830  BP: 136/86 137/84 138/82 125/85  Pulse: 88 86 89 89  Resp: (!) 28 (!) 26 (!) 21 (!) 24  Temp:      TempSrc:      SpO2: 100% 100% 100% 100%  Weight:      Height:       Constitutional: WN/WD obese African-American male currently in some mild distress appears calm but does appear uncomfortable Eyes: Lids and conjunctivae normal, sclerae anicteric  ENMT: External Ears, Nose appear normal. Grossly normal hearing.  Neck: Appears normal, supple, no cervical masses, normal ROM, no appreciable thyromegaly; no JVD Respiratory: Diminished to auscultation bilaterally, no wheezing, rales, rhonchi or crackles. Normal respiratory effort and patient is not tachypenic. No accessory muscle use.  Cardiovascular: RRR, no murmurs / rubs / gallops. S1 and S2 auscultated.  1+ lower extremity edema bilaterally Abdomen: Soft, tender to palpate, distended secondary body habitus. bowel sounds positive x4.  GU: Deferred. Musculoskeletal: No clubbing / cyanosis of digits/nails. No joint deformity upper and lower extremities.  Skin: No rashes, lesions, ulcers on limited skin evaluation. No induration; Warm and dry.  Neurologic: CN 2-12 grossly intact with no focal deficits. Romberg sign and cerebellar reflexes not assessed.  Psychiatric: Normal judgment and insight. Alert and oriented x 3. Normal mood and appropriate affect.   Labs on Admission: I have personally reviewed following labs and imaging studies  CBC: Recent Labs  Lab 06/21/19 0430 06/21/19 1203  06/22/19 0425 06/27/19 1316 06/27/19 1546  WBC 5.5  --  6.9 9.3  --   NEUTROABS  --   --  3.8 8.3*  --   HGB 7.7* 8.5* 8.1* 9.1* 9.9*  HCT 25.3* 27.4* 26.7* 31.4* 29.0*  MCV 83.8  --  83.2 87.0  --   PLT 221  --  240 230  --    Basic Metabolic Panel: Recent Labs  Lab 06/21/19 0430 06/22/19 0425 06/27/19 1316 06/27/19 1546  NA 134* 133* 131* 134*  K 3.8 3.8 3.8 3.9  CL 102 100 100  --   CO2 25 24 18*  --   GLUCOSE 145* 165* 416*  --   BUN --   CREATININE 0.61 0.60* 0.63  --   CALCIUM 8.3* 8.5* 8.6*  --   MG 1.8 1.7 1.7  --   PHOS 4.1 3.8  --   --    GFR: Estimated Creatinine Clearance: 147.3 mL/min (by C-G formula based on SCr of 0.63  mg/dL). Liver Function Tests: Recent Labs  Lab 06/21/19 0430 06/22/19 0425 06/27/19 1316  AST  --  11* 11*  ALT  --  8 9  ALKPHOS  --  92 99  BILITOT  --  0.3 0.5  PROT  --  7.2 7.4  ALBUMIN 1.9* 2.2* 2.6*   Recent Labs  Lab 06/27/19 1316  LIPASE 30   No results for input(s): AMMONIA in the last 168 hours. Coagulation Profile: No results for input(s): INR, PROTIME in the last 168 hours. Cardiac Enzymes: No results for input(s): CKTOTAL, CKMB, CKMBINDEX, TROPONINI in the last 168 hours. BNP (last 3 results) No results for input(s): PROBNP in the last 8760 hours. HbA1C: No results for input(s): HGBA1C in the last 72 hours. CBG: Recent Labs  Lab 06/21/19 1057 06/21/19 1637 06/21/19 2104 06/22/19 0609 06/27/19 1303  GLUCAP 172* 175* 202* 161* 358*   Lipid Profile: No results for input(s): CHOL, HDL, LDLCALC, TRIG, CHOLHDL, LDLDIRECT in the last 72 hours. Thyroid Function Tests: No results for input(s): TSH, T4TOTAL, FREET4, T3FREE, THYROIDAB in the last 72 hours. Anemia Panel: No results for input(s): VITAMINB12, FOLATE, FERRITIN, TIBC, IRON, RETICCTPCT in the last 72 hours. Urine analysis:    Component Value Date/Time   COLORURINE YELLOW 04/20/2019 1708   APPEARANCEUR CLEAR 04/20/2019 1708   LABSPEC  1.022 04/20/2019 1708   PHURINE 5.0 04/20/2019 1708   GLUCOSEU >=500 (A) 04/20/2019 1708   HGBUR SMALL (A) 04/20/2019 1708   BILIRUBINUR NEGATIVE 04/20/2019 1708   KETONESUR 5 (A) 04/20/2019 1708   PROTEINUR NEGATIVE 04/20/2019 1708   UROBILINOGEN 0.2 09/12/2018 0949   NITRITE NEGATIVE 04/20/2019 1708   LEUKOCYTESUR NEGATIVE 04/20/2019 1708   Sepsis Labs: !!!!!!!!!!!!!!!!!!!!!!!!!!!!!!!!!!!!!!!!!!!! (procalcitonin:4,lacticidven:4) )No results found for this or any previous visit (from the past 240 hour(s)).   Radiological Exams on Admission: CT ABDOMEN PELVIS W CONTRAST  Result Date: 06/27/2019 CLINICAL DATA:  Worsening abdominal pain and nausea. Recent admission for pancreatitis with pseudocyst. EXAM: CT ABDOMEN AND PELVIS WITH CONTRAST TECHNIQUE: Multidetector CT imaging of the abdomen and pelvis was performed using the standard protocol following bolus administration of intravenous contrast. CONTRAST:  100 cc Omnipaque 300 IV COMPARISON:  Most recent CT 05/25/2019 FINDINGS: Lower chest: Hypoventilatory atelectasis. No pleural fluid. Prominent heart size. Hepatobiliary: Diffusely decreased hepatic density consistent with steatosis. No evidence of focal liver lesion or intrahepatic fluid collection. Clips in the gallbladder fossa postcholecystectomy. No biliary dilatation. Pancreas: 2 cyst gastrostomy tubes in place decompressing previous large peripancreatic collection. Majority of the peripancreatic collection has resolved with residual ill-defined soft tissue density about the body and tail. Small pocket centrally with mottled air measures 2.5 x 1.7 cm, series 3, image 38. Residual collection in the uncinate process with heterogeneous air in fluid measures 4.7 x 2.8 cm, series 3, image 53. Mild generalized peripancreatic fat stranding. Pancreatic duct is not well-defined. Spleen: Mildly enlarged spanning 14.5 cm. Mid splenic vein is not opacified and presumably occluded. No focal  intrasplenic abnormality. Adrenals/Urinary Tract: Normal adrenal glands. Slight left renal heterogeneity is likely reactive. No hydronephrosis. No perinephric edema. Urinary bladder is physiologically distended. No bladder wall thickening. Stomach/Bowel: Bowel evaluation is limited in the absence of enteric contrast. 2 cyst gastrostomy tubes in the stomach. Gastric wall is poorly defined. There is fluid within the duodenum. No small bowel dilatation or obstruction. Mild edema of small bowel loops in the left abdomen, likely reactive. Liquid and solid stool throughout the colon. Liquid colonic stool makes detailed assessment for perienteric  fluid collections difficult. Fluid in the subhepatic right upper quadrant appears to represent fluid within tortuous colon rather than a discrete pericolonic fluid collection. Fat lobule abutting the sigmoid colon was seen on prior exam likely sequela of prior epiploic appendagitis. The appendix is normal. Vascular/Lymphatic: Portions of the mid splenic vein are not opacified and likely occluded. No visualized thrombus. The portal vein is patent. Multiple prominent peripancreatic and retroperitoneal nodes, likely reactive. Aorta bi-iliac atherosclerosis. Reproductive: Prostate is unremarkable. Other: Large pancreatic pseudocyst has improved post cyst-gastrostomy. Small residual collections as described above. Small volume of perihepatic ascites. Small amount of free fluid tracks in the pericolic gutters. Central mesenteric edema. Musculoskeletal: Degenerative change in the spine. There are no acute or suspicious osseous abnormalities. IMPRESSION: 1. Two cyst gastrostomy catheters decompressing previous large pancreatic pseudocyst. Residual ill-defined pancreatic low-density in the region of catheters. Two small heterogeneous collections persist that are not definitively drained by catheters, 2.5 x 1.7 cm adjacent to the pancreatic tail, and 4.8 x 2.8 cm collection adjacent to the  uncinate process. 2. Liquid and solid stool throughout the colon, difficult to evaluate for perienteric fluid collections. Fluid in the subhepatic right upper quadrant appears to represent fluid within tortuous colon rather than a discrete pericolonic fluid collection. 3. Portions of the mid splenic vein are not opacified and likely occluded. No visualized thrombus. 4. Hepatic steatosis. Aortic Atherosclerosis (ICD10-I70.0). Electronically Signed   By: Narda Rutherford M.D.   On: 06/27/2019 16:24   DG Chest Portable 1 View  Result Date: 06/27/2019 CLINICAL DATA:  PICC placement EXAM: PORTABLE CHEST 1 VIEW COMPARISON:  04/23/2019 and older studies FINDINGS: Right PICC tip projects near the expected confluence of the right subclavian vein and superior vena cava. Heart, mediastinum and hila are unremarkable. The lungs are clear. No pleural effusion or pneumothorax. Skeletal structures are grossly intact. IMPRESSION: 1. Right PICC tip projects at the confluence of the right subclavian vein and superior vena cava. 2. No active cardiopulmonary disease. Electronically Signed   By: Amie Portland M.D.   On: 06/27/2019 13:42    EKG: Independently reviewed.  Sinus rhythm with a rate of 88 and a QTC of 420.  There is no ST elevation on my interpretation  Assessment/Plan Active Problems:   Morbid obesity (HCC)   Abdominal pain, epigastric   Abnormal CT of the abdomen   Protein-calorie malnutrition, severe   Pancreatic necrosis   Infected pancreatic pseudocyst   Intractable nausea and vomiting   Abdominal pain   Diabetes mellitus type 2 in obese (HCC)  Nausea vomiting diarrhea as well as abdominal pain in the setting of below rule out other etiologies including infectious -Admit to inpatient telemetry -CT of the abdomen pelvis as below -We will make the patient n.p.o. -Fluid resuscitation and give 1 L of normal saline in the ED and will repeat.  Then will place on normal saline at 75 MLS per hour -We  will start TPN again given his concern for recurrent pancreatitis with a pseudocyst -Check blood cultures x2 and a GI pathogen panel -Clinically do not suspect C. difficile given the patient is afebrile and has no leukocytosis -Resume antibiotics as below -Antiemetics with Zofran -We will get Gastroenterology back on board and have them consult for further evaluation recommendations  -Continue pain control with IV fentanyl 25 mcg  Recent Acute on Chronic Pancreatitis with Necrosis infected pseudocyst with staph aureus -Recently discharged on 06/22/2019 and he had Cystogastrostomy tube, EGDs and necrosectomy -Lipase level was 30 on  admission -CT of the abdomen pelvis showed "Two cyst gastrostomy catheters decompressing previous large pancreatic pseudocyst. Residual ill-defined pancreatic low-density in the region of catheters. Two small heterogeneous collections persist that are not definitively drained by catheters, 2.5 x 1.7 cm adjacent to the pancreatic tail, and 4.8 x 2.8 cm collection adjacent to the uncinate process. Liquid and solid stool throughout the colon, difficult to evaluate for perienteric fluid collections. Fluid in the subhepatic right upper quadrant appears to represent fluid within tortuous colon rather than a discrete pericolonic fluid collection. Portions of the mid splenic vein are not opacified and likely occluded. No visualized thrombus. Hepatic steatosis." -Resume Creon -We will reconsult Gastroenterology for further evaluation recommendations -Currently will make him n.p.o. and patient will be started back on antibiotics with IV vancomycin, IV ceftriaxone and Flagyl -We will have pharmacy consulted for TPN administration given the patient will be n.p.o.  Uncontrolled diabetes mellitus type 2 with hyperglycemia -Last hemoglobin A1c was 13.2 -Currently not in DKA as anion gap is normal but he does have a small metabolic acidosis -IV fluids as above -Hold Metformin XR 1000  units p.o. twice daily -Resume Lantus 11 units daily and will place on sensitive NovoLog sliding scale every 4 for now -We will order TPN for the patient given that we will make him n.p.o. -Continue monitor CBGs carefully admission was 416  Essential Hypertension  -Blood pressures were not elevated but will hold his antihypertensives of lisinopril, amlodipine and budesonide  Metabolic Acidosis -As above -Patient CO2 was 18, anion gap is 13, and chloride was 100 -Continue IV fluid hydration as above and repeat CMP in a.m.  Hyponatremia/Pseudohyponatremia  -Patient's sodium admission was 131 and likely low in the setting of hypoglycemia -Improved glycemic control and continue with IV fluid hydration and repeat CMP in a.m.  Severe protein calorie malnutrition  -We will obtain nutritionist consultation -Resume TPN  Bilateral lower extremity edema -Likely in the setting of malnutrition, anemia and hypoalbuminemia with questionable venous insufficiency  -Hold Bumex and continue to encourage leg elevation  Gout -Resume allopurinol when able  GERD -We will change PPI to IV  Obesity -Estimated body mass index is 39.36 kg/m as calculated from the following:   Height as of this encounter: 5\' 11"  (1.803 m).   Weight as of this encounter: 128 kg. -Weight Loss and Dietary Counseling given  DVT prophylaxis: Enoxaparin 40 units subcu every 24 Code Status: FULL CODE  Family Communication: No family present at bedside  Disposition Plan: Pending further clinical improvement of his intractable nausea vomiting abdominal pain and resolution of his diarrhea.  Will need further evaluation by gastroenterology prior to discharging home and will need to obtain PT OT evaluation Consults called: Gastroenterology  Admission status: Inpatient Medical Telemetry   Severity of Illness: The appropriate patient status for this patient is INPATIENT. Inpatient status is judged to be reasonable and necessary  in order to provide the required intensity of service to ensure the patient's safety. The patient's presenting symptoms, physical exam findings, and initial radiographic and laboratory data in the context of their chronic comorbidities is felt to place them at high risk for further clinical deterioration. Furthermore, it is not anticipated that the patient will be medically stable for discharge from the hospital within 2 midnights of admission. The following factors support the patient status of inpatient.   " The patient's presenting symptoms include nausea, vomiting, diarrhea as well as abdominal pain. " The worrisome physical exam findings include abdominal tenderness "  The initial radiographic and laboratory data are worrisome because of pancreatic pseudocyst " The chronic co-morbidities are as above   * I certify that at the point of admission it is my clinical judgment that the patient will require inpatient hospital care spanning beyond 2 midnights from the point of admission due to high intensity of service, high risk for further deterioration and high frequency of surveillance required.Merlene Laughter, D.O. [facility] is on AMION  If 7PM-7AM, please contact night-coverage [email or web address]  06/27/2019, 6:48 PM

## 2019-06-27 NOTE — ED Triage Notes (Signed)
Pt presents with [facility] from home for hyperglycemia (460), N/V/D, abd pain x2 days.Abd pain center of abd ratiating to left. Discharged from hospital Monday for pancreatitis. Additionally complains of tingling to R arm (same arm as PICC). Receives TPN through PICC.  EMS provided fentayl intranasal, 4mg  zofram IM enroute. 148/94, 110bpm, 99%RA, lungs clear

## 2019-06-27 NOTE — ED Provider Notes (Signed)
[place] EMERGENCY DEPARTMENT Provider Note   CSN: [id number] Arrival date & time: 06/27/19  1248     History Chief Complaint  Patient presents with  . Hyperglycemia    Caleb Chambers is a 54 y.o. male.  The history is provided by the patient and medical records. No language interpreter was used.  Abdominal Pain Pain location:  Epigastric, RUQ and LUQ Pain quality: aching   Pain radiates to:  Does not radiate Pain severity:  Severe Onset quality:  Gradual Duration:  2 days Timing:  Constant Progression:  Worsening Chronicity:  Recurrent Context: previous surgery   Context: not alcohol use   Relieved by:  Nothing Worsened by:  Palpation and eating Ineffective treatments:  None tried Associated symptoms: diarrhea, fatigue, nausea and vomiting   Associated symptoms: no chest pain, no chills, no constipation, no cough, no dysuria, no fever and no shortness of breath   Risk factors: multiple surgeries        Past Medical History:  Diagnosis Date  . Diabetes mellitus without complication ([place])   . DKA (diabetic ketoacidoses) ([place]) 05/12/2019  . Gout   . Hypertension   . Pancreatitis     Patient Active Problem List   Diagnosis Date Noted  . Infected pancreatic pseudocyst 06/11/2019  . Pancreatic necrosis   . Protein-calorie malnutrition, severe 05/13/2019  . DKA (diabetic ketoacidosis) ([place]) 05/12/2019  . Debility 05/12/2019  . Morbid obesity ([place])   . Abdominal pain, epigastric   . Abnormal CT of the abdomen   . Acute pancreatitis 04/20/2019  . DKA (diabetic ketoacidoses) ([place]) 04/20/2019  . Acute kidney injury [facility])     Past Surgical History:  Procedure Laterality Date  . BILIARY STENT PLACEMENT  05/28/2019   Procedure: BILIARY STENT PLACEMENT;  Surgeon: Rush Landmark Telford Nab., MD;  Location: [place];  Service: Gastroenterology;;  plastic double pig tail stents placed through cystgastrostomy x2  . CHOLECYSTECTOMY    . ENDOROTOR  N/A 06/01/2019   Procedure: [id number];  Surgeon: Mansouraty, Telford Nab., MD;  Location: [place];  Service: Gastroenterology;  Laterality: N/A;  . ENDOROTOR N/A 06/03/2019   Procedure: [id number];  Surgeon: Mansouraty, Telford Nab., MD;  Location: [place];  Service: Gastroenterology;  Laterality: N/A;  . ENDOROTOR N/A 06/05/2019   Procedure: CLEXNTZGY;  Surgeon: Mansouraty, Telford Nab., MD;  Location: [place];  Service: Gastroenterology;  Laterality: N/A;  . ENDOROTOR  06/08/2019   Procedure: [id number];  Surgeon: [name]., MD;  Location: [place];  Service: Gastroenterology;;  . Delane Ginger N/A 06/12/2019   Procedure: [id number];  Surgeon: Mansouraty, Telford Nab., MD;  Location: [place];  Service: Gastroenterology;  Laterality: N/A;  . ENDOROTOR N/A 06/15/2019   Procedure: [id number];  Surgeon: Mansouraty, Telford Nab., MD;  Location: [place];  Service: Gastroenterology;  Laterality: N/A;  . ENDOROTOR N/A 06/17/2019   Procedure: [id number];  Surgeon: Mansouraty, Telford Nab., MD;  Location: [place];  Service: Gastroenterology;  Laterality: N/A;  . ESOPHAGOGASTRODUODENOSCOPY N/A 06/01/2019   Procedure: ESOPHAGOGASTRODUODENOSCOPY (EGD) with NECROSECTOMY;  Surgeon: Mansouraty, Telford Nab., MD;  Location: [facility] ENDOSCOPY;  Service: Gastroenterology;  Laterality: N/A;  . ESOPHAGOGASTRODUODENOSCOPY Bilateral 06/03/2019   Procedure: ESOPHAGOGASTRODUODENOSCOPY (EGD) with Necrosectomy;  Surgeon: Rush Landmark Telford Nab., MD;  Location: [facility] ENDOSCOPY;  Service: Gastroenterology;  Laterality: Bilateral;  . ESOPHAGOGASTRODUODENOSCOPY N/A 06/05/2019   Procedure: ESOPHAGOGASTRODUODENOSCOPY (EGD) with Necrosectomy;  Surgeon: Rush Landmark Telford Nab., MD;  Location: [place];  Service: Gastroenterology;  Laterality: N/A;  . ESOPHAGOGASTRODUODENOSCOPY N/A 06/08/2019   Procedure: ESOPHAGOGASTRODUODENOSCOPY (  EGD) with NECROSECTOMY;  Surgeon: Mansouraty, Netty Starring., MD;  Location: [facility]  ENDOSCOPY;  Service: Gastroenterology;  Laterality: N/A;  . ESOPHAGOGASTRODUODENOSCOPY N/A 06/12/2019   Procedure: ESOPHAGOGASTRODUODENOSCOPY (EGD) with NECROSECTOMY;  Surgeon: Mansouraty, Netty Starring., MD;  Location: [facility] ENDOSCOPY;  Service: Gastroenterology;  Laterality: N/A;  . ESOPHAGOGASTRODUODENOSCOPY N/A 06/15/2019   Procedure: ESOPHAGOGASTRODUODENOSCOPY (EGD) with NECROSECTOMY;  Surgeon: Mansouraty, Netty Starring., MD;  Location: [facility] ENDOSCOPY;  Service: Gastroenterology;  Laterality: N/A;  . ESOPHAGOGASTRODUODENOSCOPY N/A 06/17/2019   Procedure: ESOPHAGOGASTRODUODENOSCOPY (EGD) with NECROSECTOMY;  Surgeon: Mansouraty, Netty Starring., MD;  Location: [facility] ENDOSCOPY;  Service: Gastroenterology;  Laterality: N/A;  . ESOPHAGOGASTRODUODENOSCOPY (EGD) WITH PROPOFOL N/A 05/28/2019   Procedure: ESOPHAGOGASTRODUODENOSCOPY (EGD) WITH PROPOFOL;  Surgeon: Meridee Score Netty Starring., MD;  Location: [facility] ENDOSCOPY;  Service: Gastroenterology;  Laterality: N/A;  . EUS Left 05/28/2019   Procedure: UPPER ENDOSCOPIC ULTRASOUND (EUS) LINEAR;  Surgeon: Lemar Lofty., MD;  Location: [facility] ENDOSCOPY;  Service: Gastroenterology;  Laterality: Left;  . PANCREATIC STENT PLACEMENT  05/28/2019   Procedure: PANCREATIC STENT PLACEMENT;  Surgeon: Meridee Score Netty Starring., MD;  Location: [facility] ENDOSCOPY;  Service: Gastroenterology;;  cystgastrostomy with axiod stent placement  . PANCREATIC STENT PLACEMENT  06/01/2019   Procedure: PANCREATIC STENT PLACEMENT;  Surgeon: Meridee Score Netty Starring., MD;  Location: [facility] ENDOSCOPY;  Service: Gastroenterology;;  . PANCREATIC STENT PLACEMENT  06/03/2019   Procedure: PANCREATIC STENT PLACEMENT;  Surgeon: Lemar Lofty., MD;  Location: [facility] ENDOSCOPY;  Service: Gastroenterology;;  double pig tail stents placed through cystgastrostomy  . PANCREATIC STENT PLACEMENT  06/05/2019   Procedure: PANCREATIC STENT PLACEMENT;  Surgeon: Meridee Score Netty Starring., MD;  Location: [facility] ENDOSCOPY;  Service:  Gastroenterology;;  . PANCREATIC STENT PLACEMENT  06/08/2019   Procedure: PANCREATIC STENT PLACEMENT;  Surgeon: Lemar Lofty., MD;  Location: [facility] ENDOSCOPY;  Service: Gastroenterology;;  double pigtail cyst gastrostomy   . PANCREATIC STENT PLACEMENT  06/12/2019   Procedure: PANCREATIC STENT PLACEMENT;  Surgeon: Meridee Score Netty Starring., MD;  Location: [facility] ENDOSCOPY;  Service: Gastroenterology;;  double pigtail stents placed through cystgastrostomy  . PANCREATIC STENT PLACEMENT  06/15/2019   Procedure: PANCREATIC STENT PLACEMENT;  Surgeon: Meridee Score Netty Starring., MD;  Location: [facility] ENDOSCOPY;  Service: Gastroenterology;;  . PANCREATIC STENT PLACEMENT  06/17/2019   Procedure: PANCREATIC STENT PLACEMENT;  Surgeon: Lemar Lofty., MD;  Location: [facility] ENDOSCOPY;  Service: Gastroenterology;;  . Francine Graven REMOVAL  06/01/2019   Procedure: STENT REMOVAL;  Surgeon: Lemar Lofty., MD;  Location: [facility] ENDOSCOPY;  Service: Gastroenterology;;  . Francine Graven REMOVAL  06/03/2019   Procedure: STENT REMOVAL;  Surgeon: Lemar Lofty., MD;  Location: [facility] ENDOSCOPY;  Service: Gastroenterology;;  . Francine Graven REMOVAL  06/05/2019   Procedure: STENT REMOVAL;  Surgeon: Lemar Lofty., MD;  Location: [facility] ENDOSCOPY;  Service: Gastroenterology;;  . Francine Graven REMOVAL  06/08/2019   Procedure: STENT REMOVAL;  Surgeon: Lemar Lofty., MD;  Location: [facility] ENDOSCOPY;  Service: Gastroenterology;;  . Francine Graven REMOVAL  06/12/2019   Procedure: STENT REMOVAL;  Surgeon: Lemar Lofty., MD;  Location: [facility] ENDOSCOPY;  Service: Gastroenterology;;  . Francine Graven REMOVAL  06/15/2019   Procedure: STENT REMOVAL;  Surgeon: Lemar Lofty., MD;  Location: [facility] ENDOSCOPY;  Service: Gastroenterology;;  . Francine Graven REMOVAL  06/17/2019   Procedure: STENT REMOVAL;  Surgeon: Lemar Lofty., MD;  Location: [facility] ENDOSCOPY;  Service: Gastroenterology;;       Family History  Problem Relation Age of Onset  . Healthy Mother   .  Hypertension Father     Social History  Tobacco Use  . Smoking status: Never Smoker  . Smokeless tobacco: Never Used  Substance Use Topics  . Alcohol use: Not Currently  . Drug use: Not on file    Home Medications Prior to Admission medications   Medication Sig Start Date End Date Taking? Authorizing Provider  allopurinol (ZYLOPRIM) 300 MG tablet Take 300 mg by mouth daily.     [provider]  amLODipine (NORVASC) 10 MG tablet Take 10 mg by mouth daily. 11/30/16   [provider]  bumetanide (BUMEX) 0.5 MG tablet Take 0.5 mg by mouth daily as needed (fluid).     [provider]  colchicine (COLCRYS) 0.6 MG tablet Take 1 tablet (0.6 mg total) by mouth 2 (two) times daily. Needs office visit (second notice) Patient taking differently: Take 0.6 mg by mouth daily as needed (gout flare up).  10/30/11   Nelva Nay, PA-C  doxycycline (VIBRA-TABS) 100 MG tablet Take 1 tablet (100 mg total) by mouth every 12 (twelve) hours. 06/22/19   Almon Hercules, MD  folic acid (FOLVITE) 1 MG tablet Take 1 tablet (1 mg total) by mouth daily. 06/22/19   Almon Hercules, MD  HUMIRA PEN 40 MG/0.4ML PNKT 0.4 mLs by Subdermal route every 14 (fourteen) days. 02/09/19   [provider]  insulin aspart (NOVOLOG) 100 UNIT/ML FlexPen Inject 3 Units into the skin 3 (three) times daily with meals. 06/22/19   Almon Hercules, MD  lipase/protease/amylase (CREON) 36000 UNITS CPEP capsule Take 1 capsule (36,000 Units total) by mouth 3 (three) times daily before meals. 06/22/19   Almon Hercules, MD  lisinopril (ZESTRIL) 20 MG tablet Take 1 tablet (20 mg total) by mouth daily. 06/22/19   Almon Hercules, MD  metFORMIN (GLUCOPHAGE-XR) 500 MG 24 hr tablet Take 2 tablets (1,000 mg total) by mouth daily with breakfast. 06/22/19   Almon Hercules, MD  metroNIDAZOLE (FLAGYL) 500 MG tablet Take 1 tablet (500 mg total) by mouth every 8 (eight) hours. 06/22/19   Almon Hercules, MD  morphine (MS CONTIN) 15 MG 12 hr  tablet Take 1 tablet (15 mg total) by mouth every 12 (twelve) hours for 7 days. 06/22/19 06/29/19  Almon Hercules, MD  pantoprazole (PROTONIX) 40 MG tablet Take 1 tablet (40 mg total) by mouth daily. 06/22/19   Almon Hercules, MD  potassium chloride (KLOR-CON) 10 MEQ tablet Take 1 tablet (10 mEq total) by mouth daily. 06/22/19   Almon Hercules, MD    Allergies    Penicillins, Metoprolol, and Clindamycin  Review of Systems   Review of Systems  Constitutional: Positive for fatigue. Negative for chills, diaphoresis and fever.  HENT: Negative for congestion.   Respiratory: Negative for cough, chest tightness, shortness of breath and wheezing.   Cardiovascular: Negative for chest pain, palpitations and leg swelling.  Gastrointestinal: Positive for abdominal pain, diarrhea, nausea and vomiting. Negative for constipation.  Genitourinary: Negative for dysuria, flank pain and frequency.  Musculoskeletal: Negative for back pain, neck pain and neck stiffness.  Neurological: Negative for headaches.  Psychiatric/Behavioral: Negative for agitation.  All other systems reviewed and are negative.   Physical Exam Updated Vital Signs BP 138/87   Pulse 83   Temp 97.7 F (36.5 C) (Oral)   Resp (!) 28   Ht 5\' 11"  (1.803 m)   Wt 128 kg   SpO2 100%   BMI 39.36 kg/m   Physical Exam Vitals and nursing note reviewed.  Constitutional:  General: He is not in acute distress.    Appearance: He is well-developed. He is ill-appearing. He is not toxic-appearing or diaphoretic.  HENT:     Head: Normocephalic and atraumatic.  Eyes:     General: No scleral icterus.    Extraocular Movements: Extraocular movements intact.     Conjunctiva/sclera: Conjunctivae normal.  Cardiovascular:     Rate and Rhythm: Regular rhythm. Tachycardia present.     Heart sounds: No murmur.  Pulmonary:     Effort: Pulmonary effort is normal. No respiratory distress.     Breath sounds: Normal breath sounds. No wheezing, rhonchi or  rales.  Chest:     Chest wall: No tenderness.  Abdominal:     General: Bowel sounds are normal.     Palpations: Abdomen is soft.     Tenderness: There is abdominal tenderness in the right upper quadrant, epigastric area and left upper quadrant. There is no right CVA tenderness or left CVA tenderness.  Musculoskeletal:     Cervical back: Neck supple.     Comments: Symmetric pulses in upper and lower extremities.  PICC line in right upper arm.  Nontender at that location.  Normal grip strength and sensation in extremities.  Skin:    General: Skin is warm and dry.     Capillary Refill: Capillary refill takes less than 2 seconds.  Neurological:     General: No focal deficit present.     Mental Status: He is alert.  Psychiatric:        Mood and Affect: Mood is anxious.     ED Results / Procedures / Treatments   Labs (all labs ordered are listed, but only abnormal results are displayed) Labs Reviewed  CBC WITH DIFFERENTIAL/PLATELET - Abnormal; Notable for the following components:      Result Value   RBC 3.61 (*)    Hemoglobin 9.1 (*)    HCT 31.4 (*)    MCH 25.2 (*)    MCHC 29.0 (*)    RDW 18.0 (*)    Neutro Abs 8.3 (*)    All other components within normal limits  COMPREHENSIVE METABOLIC PANEL - Abnormal; Notable for the following components:   Sodium 131 (*)    CO2 18 (*)    Glucose, Bld 416 (*)    Calcium 8.6 (*)    Albumin 2.6 (*)    AST 11 (*)    All other components within normal limits  CBG MONITORING, ED - Abnormal; Notable for the following components:   Glucose-Capillary 358 (*)    All other components within normal limits  POCT I-STAT EG7 - Abnormal; Notable for the following components:   pCO2, Ven 38.0 (*)    pO2, Ven 148.0 (*)    Sodium 134 (*)    HCT 29.0 (*)    Hemoglobin 9.9 (*)    All other components within normal limits  URINE CULTURE  LIPASE, BLOOD  LACTIC ACID, PLASMA  MAGNESIUM  LACTIC ACID, PLASMA  URINALYSIS, ROUTINE W REFLEX MICROSCOPIC    I-STAT VENOUS BLOOD GAS, ED    EKG EKG Interpretation  Date/Time:  Saturday June 27 2019 13:08:20 EST Ventricular Rate:  88 PR Interval:    QRS Duration: 88 QT Interval:  347 QTC Calculation: 420 R Axis:   -10 Text Interpretation: Sinus rhythm Borderline low voltage, extremity leads Borderline ST elevation, inferior leads When compared to prior, no significant cahnges seen. No sTEMI Confirmed by Theda Belfast ([id number]) on 06/27/2019 1:17:31 PM   Radiology  CT ABDOMEN PELVIS W CONTRAST  Result Date: 06/27/2019 CLINICAL DATA:  Worsening abdominal pain and nausea. Recent admission for pancreatitis with pseudocyst. EXAM: CT ABDOMEN AND PELVIS WITH CONTRAST TECHNIQUE: Multidetector CT imaging of the abdomen and pelvis was performed using the standard protocol following bolus administration of intravenous contrast. CONTRAST:  100 cc Omnipaque 300 IV COMPARISON:  Most recent CT 05/25/2019 FINDINGS: Lower chest: Hypoventilatory atelectasis. No pleural fluid. Prominent heart size. Hepatobiliary: Diffusely decreased hepatic density consistent with steatosis. No evidence of focal liver lesion or intrahepatic fluid collection. Clips in the gallbladder fossa postcholecystectomy. No biliary dilatation. Pancreas: 2 cyst gastrostomy tubes in place decompressing previous large peripancreatic collection. Majority of the peripancreatic collection has resolved with residual ill-defined soft tissue density about the body and tail. Small pocket centrally with mottled air measures 2.5 x 1.7 cm, series 3, image 38. Residual collection in the uncinate process with heterogeneous air in fluid measures 4.7 x 2.8 cm, series 3, image 53. Mild generalized peripancreatic fat stranding. Pancreatic duct is not well-defined. Spleen: Mildly enlarged spanning 14.5 cm. Mid splenic vein is not opacified and presumably occluded. No focal intrasplenic abnormality. Adrenals/Urinary Tract: Normal adrenal glands. Slight left renal  heterogeneity is likely reactive. No hydronephrosis. No perinephric edema. Urinary bladder is physiologically distended. No bladder wall thickening. Stomach/Bowel: Bowel evaluation is limited in the absence of enteric contrast. 2 cyst gastrostomy tubes in the stomach. Gastric wall is poorly defined. There is fluid within the duodenum. No small bowel dilatation or obstruction. Mild edema of small bowel loops in the left abdomen, likely reactive. Liquid and solid stool throughout the colon. Liquid colonic stool makes detailed assessment for perienteric fluid collections difficult. Fluid in the subhepatic right upper quadrant appears to represent fluid within tortuous colon rather than a discrete pericolonic fluid collection. Fat lobule abutting the sigmoid colon was seen on prior exam likely sequela of prior epiploic appendagitis. The appendix is normal. Vascular/Lymphatic: Portions of the mid splenic vein are not opacified and likely occluded. No visualized thrombus. The portal vein is patent. Multiple prominent peripancreatic and retroperitoneal nodes, likely reactive. Aorta bi-iliac atherosclerosis. Reproductive: Prostate is unremarkable. Other: Large pancreatic pseudocyst has improved post cyst-gastrostomy. Small residual collections as described above. Small volume of perihepatic ascites. Small amount of free fluid tracks in the pericolic gutters. Central mesenteric edema. Musculoskeletal: Degenerative change in the spine. There are no acute or suspicious osseous abnormalities. IMPRESSION: 1. Two cyst gastrostomy catheters decompressing previous large pancreatic pseudocyst. Residual ill-defined pancreatic low-density in the region of catheters. Two small heterogeneous collections persist that are not definitively drained by catheters, 2.5 x 1.7 cm adjacent to the pancreatic tail, and 4.8 x 2.8 cm collection adjacent to the uncinate process. 2. Liquid and solid stool throughout the colon, difficult to evaluate for  perienteric fluid collections. Fluid in the subhepatic right upper quadrant appears to represent fluid within tortuous colon rather than a discrete pericolonic fluid collection. 3. Portions of the mid splenic vein are not opacified and likely occluded. No visualized thrombus. 4. Hepatic steatosis. Aortic Atherosclerosis (ICD10-I70.0). Electronically Signed   By: Narda Rutherford M.D.   On: 06/27/2019 16:24   DG Chest Portable 1 View  Result Date: 06/27/2019 CLINICAL DATA:  PICC placement EXAM: PORTABLE CHEST 1 VIEW COMPARISON:  04/23/2019 and older studies FINDINGS: Right PICC tip projects near the expected confluence of the right subclavian vein and superior vena cava. Heart, mediastinum and hila are unremarkable. The lungs are clear. No pleural effusion or pneumothorax. Skeletal structures are  grossly intact. IMPRESSION: 1. Right PICC tip projects at the confluence of the right subclavian vein and superior vena cava. 2. No active cardiopulmonary disease. Electronically Signed   By: Amie Portland M.D.   On: 06/27/2019 13:42    Procedures Procedures (including critical care time)  Medications Ordered in ED Medications  promethazine (PHENERGAN) injection 25 mg (25 mg Intravenous Given 06/27/19 1528)  HYDROmorphone (DILAUDID) injection 1 mg (1 mg Intravenous Given 06/27/19 1527)  sodium chloride 0.9 % bolus 1,000 mL (1,000 mLs Intravenous New Bag/Given 06/27/19 1532)  iohexol (OMNIPAQUE) 300 MG/ML solution 100 mL (100 mLs Intravenous Contrast Given 06/27/19 1605)    ED Course  I have reviewed the triage vital signs and the nursing notes.  Pertinent labs & imaging results that were available during my care of the patient were reviewed by me and considered in my medical decision making (see chart for details).    MDM Rules/Calculators/A&P                      Caleb Chambers is a 54 y.o. male with a past medical history significant for diabetes with recent admission for DKA with acute pancreatitis  status post pancreatic stent replacement on 06/17/2019, currently getting nightly TPN for nutrition and getting IV antibiotics through PICC line who presents with 2 days of severe nausea, vomiting, abdominal pain, diarrhea, feeling dehydrated, and fatigue.  He reports that he was discharged on Monday from a prolonged stay in the hospital for the DKA and pancreatitis.  He says that from Monday until yesterday he did well until yesterday when he started having abdominal pain again.  He describes the pain is 9 out of 10 in severity.  It feels like his pancreatitis is flaring up.  He reports constant nausea and vomiting and feeling dehydrated.  He reports he is having diarrhea as well.  He denies new trauma.  He denies any fevers or chills.  Denies any cough, congestion, palpitations, or chest pain.  His pain is primarily in the upper abdomen similar to before.  On exam, patient has abdominal tenderness in the upper abdomen.  Bowel sounds were appreciated.  Lungs were clear and chest was nontender.  Good pulses in extremities.  Clinically I am concerned the patient may be back in DKA as his glucose was elevated on arrival.  He will be given fluids, pain medicine, nausea medicine.  Will get electrolytes and on his discharge information it does say he needs a repeat CT scan in 1 or 2 weeks so we will get the repeat CT scan given the new abdominal pain with his recent stenting and procedure.  Anticipate admission again for this patient.  Labs began to return and he does not appear to be in DKA at this time however he does appear dehydrated.   CT scan was ordered to further evaluate for complication after his recent procedure.  I am concerned about the patient not tolerating p.o. with the continued nausea, vomiting, diarrhea, and abdominal pain.  Care transferred to Dr. Anitra Lauth while awaiting for results of CT scan and other labs.  Anticipate admission for the patient symptoms and was he feels much better and has  improved p.o. tolerance.    Final Clinical Impression(s) / ED Diagnoses Final diagnoses:  Nausea vomiting and diarrhea  Generalized abdominal pain  Dehydration     Clinical Impression: 1. Nausea vomiting and diarrhea   2. Generalized abdominal pain   3. Dehydration  Disposition: Admit  This note was prepared with assistance of Conservation officer, historic buildings. Occasional wrong-word or sound-a-like substitutions may have occurred due to the inherent limitations of voice recognition software.      Tazia Illescas, Canary Brim, MD 06/27/19 1655

## 2019-06-27 NOTE — ED Provider Notes (Addendum)
Patient signed out by Dr. Rush Landmark for worsening abdominal pain nausea and diarrhea that has worsened today.  On repeat evaluation patient is still having significant abdominal pain and even when trying to take a tablet he vomited it up.  He recently had an infected pancreatic pseudocysts that required drainage and CT today shows that there is still some residual persistence collections that were not definitively drained by the catheters adjacent to the uncinate process.  Patient has had no fever, CBC white count and lactic acid are within normal limits.  We will plan on admission for ongoing symptomatic control and may need to be reevaluated by gastroenterology.   7:15 PM Spoke with Dr. Lavon Paganini  Who reported pt needs symptomatic treatment but does not appear infected at this time and GI probably does not need to see him this weekend.  She reports if there are any questions the admitting team can contact them. Gwyneth Sprout, MD 06/27/19 1718    Gwyneth Sprout, MD 06/27/19 1931

## 2019-06-27 NOTE — Progress Notes (Addendum)
Pharmacy Antibiotic Note  Caleb Chambers is a 54 y.o. male admitted on 06/27/2019 with Intra- abd infection.  Pharmacy has been consulted for Vancomycin  dosing.   Height: 5\' 11"  (180.3 cm) Weight: 282 lb 3 oz (128 kg) IBW/kg (Calculated) : 75.3  Temp (24hrs), Avg:97.7 F (36.5 C), Min:97.7 F (36.5 C), Max:97.7 F (36.5 C)  Recent Labs  Lab 06/21/19 0430 06/22/19 0425 06/27/19 1316 06/27/19 1523  WBC 5.5 6.9 9.3  --   CREATININE 0.61 0.60* 0.63  --   LATICACIDVEN  --   --   --  1.2    Estimated Creatinine Clearance: 147.3 mL/min (by C-G formula based on SCr of 0.63 mg/dL).    Allergies  Allergen Reactions  . Penicillins Anaphylaxis, Hives, Swelling and Rash    Spoke with patient and he reported in his 62s he had a PCN (unsure which specific drug) and developed a rash with SOB. Reported no PCNs since.  Did it involve swelling of the face/tongue/throat, SOB, or low BP? Unknown Did it involve sudden or severe rash/hives, skin peeling, or any reaction on the inside of your mouth or nose? Unknown Did you need to seek medical attention at a hospital or doctor's office? Unknown When did it last happen?mid 20's (age) If all above answers are "NO", may proc  . Metoprolol Other (See Comments)    Chest pains - reported by [facility] and  [facility] 02/08/14  . Clindamycin Rash and Other (See Comments)     reported by [facility] and [facility] 09-30-13    Antimicrobials this admission: 3/13 Ceftriaxone >>  3/13 Vancomycin >>   Dose adjustments this admission: N/a  Microbiology results: 2/11: MSSA fluid from cyst drainage    Plan:  - Start Vancomycin 1500mg  IV q12h - Est Calc AUC 469 - Monitor patients renal function and urine output  - De-escalate ABX when appropriate   Thank you for allowing pharmacy to be a part of this patient's care.  4/11 PharmD. BCPS 06/27/2019 6:31 PM

## 2019-06-28 ENCOUNTER — Other Ambulatory Visit: Payer: Self-pay

## 2019-06-28 ENCOUNTER — Inpatient Hospital Stay: Payer: Self-pay

## 2019-06-28 DIAGNOSIS — K8689 Other specified diseases of pancreas: Secondary | ICD-10-CM

## 2019-06-28 DIAGNOSIS — R935 Abnormal findings on diagnostic imaging of other abdominal regions, including retroperitoneum: Secondary | ICD-10-CM

## 2019-06-28 DIAGNOSIS — E43 Unspecified severe protein-calorie malnutrition: Secondary | ICD-10-CM

## 2019-06-28 DIAGNOSIS — K863 Pseudocyst of pancreas: Secondary | ICD-10-CM

## 2019-06-28 DIAGNOSIS — R1013 Epigastric pain: Secondary | ICD-10-CM

## 2019-06-28 LAB — URINALYSIS, ROUTINE W REFLEX MICROSCOPIC
Bacteria, UA: NONE SEEN
Bilirubin Urine: NEGATIVE
Glucose, UA: 150 mg/dL — AB
Hgb urine dipstick: NEGATIVE
Ketones, ur: NEGATIVE mg/dL
Leukocytes,Ua: NEGATIVE
Nitrite: NEGATIVE
Protein, ur: 30 mg/dL — AB
Specific Gravity, Urine: 1.043 — ABNORMAL HIGH (ref 1.005–1.030)
pH: 5 (ref 5.0–8.0)

## 2019-06-28 LAB — CBC
HCT: 25.3 % — ABNORMAL LOW (ref 39.0–52.0)
Hemoglobin: 7.8 g/dL — ABNORMAL LOW (ref 13.0–17.0)
MCH: 25.2 pg — ABNORMAL LOW (ref 26.0–34.0)
MCHC: 30.8 g/dL (ref 30.0–36.0)
MCV: 81.9 fL (ref 80.0–100.0)
Platelets: 225 10*3/uL (ref 150–400)
RBC: 3.09 MIL/uL — ABNORMAL LOW (ref 4.22–5.81)
RDW: 17.6 % — ABNORMAL HIGH (ref 11.5–15.5)
WBC: 5.3 10*3/uL (ref 4.0–10.5)
nRBC: 0 % (ref 0.0–0.2)

## 2019-06-28 LAB — GLUCOSE, CAPILLARY
Glucose-Capillary: 114 mg/dL — ABNORMAL HIGH (ref 70–99)
Glucose-Capillary: 121 mg/dL — ABNORMAL HIGH (ref 70–99)
Glucose-Capillary: 149 mg/dL — ABNORMAL HIGH (ref 70–99)
Glucose-Capillary: 150 mg/dL — ABNORMAL HIGH (ref 70–99)
Glucose-Capillary: 161 mg/dL — ABNORMAL HIGH (ref 70–99)
Glucose-Capillary: 232 mg/dL — ABNORMAL HIGH (ref 70–99)

## 2019-06-28 LAB — COMPREHENSIVE METABOLIC PANEL
ALT: 8 U/L (ref 0–44)
AST: 11 U/L — ABNORMAL LOW (ref 15–41)
Albumin: 2.3 g/dL — ABNORMAL LOW (ref 3.5–5.0)
Alkaline Phosphatase: 87 U/L (ref 38–126)
Anion gap: 10 (ref 5–15)
BUN: 8 mg/dL (ref 6–20)
CO2: 22 mmol/L (ref 22–32)
Calcium: 8.6 mg/dL — ABNORMAL LOW (ref 8.9–10.3)
Chloride: 103 mmol/L (ref 98–111)
Creatinine, Ser: 0.62 mg/dL (ref 0.61–1.24)
GFR calc Af Amer: >60 mL/min (ref >60)
GFR calc non Af Amer: >60 mL/min (ref >60)
Glucose, Bld: 260 mg/dL — ABNORMAL HIGH (ref 70–99)
Potassium: 3.7 mmol/L (ref 3.5–5.1)
Sodium: 135 mmol/L (ref 135–145)
Total Bilirubin: 0.3 mg/dL (ref 0.3–1.2)
Total Protein: 6.8 g/dL (ref 6.5–8.1)

## 2019-06-28 LAB — DIFFERENTIAL
Abs Immature Granulocytes: 0.03 10*3/uL (ref 0.00–0.07)
Basophils Absolute: 0 10*3/uL (ref 0.0–0.1)
Basophils Relative: 0 %
Eosinophils Absolute: 0 10*3/uL (ref 0.0–0.5)
Eosinophils Relative: 0 %
Immature Granulocytes: 1 %
Lymphocytes Relative: 31 %
Lymphs Abs: 1.6 10*3/uL (ref 0.7–4.0)
Monocytes Absolute: 0.4 10*3/uL (ref 0.1–1.0)
Monocytes Relative: 8 %
Neutro Abs: 3.2 10*3/uL (ref 1.7–7.7)
Neutrophils Relative %: 60 %

## 2019-06-28 LAB — PREALBUMIN: Prealbumin: 14.1 mg/dL — ABNORMAL LOW (ref 18–38)

## 2019-06-28 LAB — HEMOGLOBIN A1C
Hgb A1c MFr Bld: 8.7 % — ABNORMAL HIGH (ref 4.8–5.6)
Mean Plasma Glucose: 202.99 mg/dL

## 2019-06-28 LAB — SARS CORONAVIRUS 2 (TAT 6-24 HRS): SARS Coronavirus 2: NEGATIVE

## 2019-06-28 LAB — MAGNESIUM: Magnesium: 1.7 mg/dL (ref 1.7–2.4)

## 2019-06-28 LAB — TSH: TSH: 1.243 u[IU]/mL (ref 0.350–4.500)

## 2019-06-28 LAB — TRIGLYCERIDES: Triglycerides: 86 mg/dL (ref <150)

## 2019-06-28 LAB — PHOSPHORUS: Phosphorus: 3.7 mg/dL (ref 2.5–4.6)

## 2019-06-28 MED ORDER — SODIUM CHLORIDE 0.9% FLUSH: 10.0000 mL | INTRAVENOUS | Status: DC | PRN

## 2019-06-28 MED ORDER — TRAZODONE HCL 50 MG PO TABS
50.0000 mg | ORAL_TABLET | Freq: Once | ORAL | Status: AC
  Administered 2019-06-29: 50 mg via ORAL
  Filled 2019-06-28: qty 1

## 2019-06-28 MED ORDER — SODIUM CHLORIDE 0.9% FLUSH
10.0000 mL | Freq: Two times a day (BID) | INTRAVENOUS | Status: DC
  Administered 2019-06-29 – 2019-07-09 (×8): 10 mL

## 2019-06-28 MED ORDER — TRACE MINERALS CU-MN-SE-ZN 300-55-60-3000 MCG/ML IV SOLN
INTRAVENOUS | Status: DC
  Filled 2019-06-28: qty 473.33

## 2019-06-28 MED ORDER — CHLORHEXIDINE GLUCONATE CLOTH 2 % EX PADS
6.0000 | MEDICATED_PAD | Freq: Every day | CUTANEOUS | Status: DC
  Administered 2019-06-28 – 2019-07-09 (×11): 6 via TOPICAL

## 2019-06-28 MED ORDER — INSULIN ASPART 100 UNIT/ML ~~LOC~~ SOLN
0.0000 [IU] | SUBCUTANEOUS | Status: DC
  Administered 2019-06-28 – 2019-06-30 (×6): 2 [IU] via SUBCUTANEOUS
  Administered 2019-06-30 (×2): 3 [IU] via SUBCUTANEOUS
  Administered 2019-06-30 (×2): 2 [IU] via SUBCUTANEOUS
  Administered 2019-06-30: 5 [IU] via SUBCUTANEOUS
  Administered 2019-07-01 – 2019-07-02 (×7): 3 [IU] via SUBCUTANEOUS
  Administered 2019-07-02: 5 [IU] via SUBCUTANEOUS
  Administered 2019-07-02 – 2019-07-03 (×8): 3 [IU] via SUBCUTANEOUS
  Administered 2019-07-03: 16:00:00 2 [IU] via SUBCUTANEOUS
  Administered 2019-07-04 (×3): 3 [IU] via SUBCUTANEOUS
  Administered 2019-07-04: 2 [IU] via SUBCUTANEOUS
  Administered 2019-07-04 – 2019-07-05 (×4): 3 [IU] via SUBCUTANEOUS
  Administered 2019-07-05 (×3): 2 [IU] via SUBCUTANEOUS
  Administered 2019-07-06 (×3): 3 [IU] via SUBCUTANEOUS
  Administered 2019-07-06: 2 [IU] via SUBCUTANEOUS
  Administered 2019-07-06 – 2019-07-07 (×4): 3 [IU] via SUBCUTANEOUS
  Administered 2019-07-07 (×2): 2 [IU] via SUBCUTANEOUS
  Administered 2019-07-08 (×5): 3 [IU] via SUBCUTANEOUS
  Administered 2019-07-08: 2 [IU] via SUBCUTANEOUS
  Administered 2019-07-09 (×2): 3 [IU] via SUBCUTANEOUS
  Administered 2019-07-09: 2 [IU] via SUBCUTANEOUS
  Administered 2019-07-09: 04:00:00 8 [IU] via SUBCUTANEOUS

## 2019-06-28 MED ORDER — TRACE MINERALS CU-MN-SE-ZN 300-55-60-3000 MCG/ML IV SOLN
INTRAVENOUS | Status: AC
  Filled 2019-06-28: qty 473.33

## 2019-06-28 MED ORDER — TRACE MINERALS CU-MN-SE-ZN 300-55-60-3000 MCG/ML IV SOLN: INTRAVENOUS | Status: DC

## 2019-06-28 MED ORDER — SODIUM CHLORIDE 0.9% FLUSH
10.0000 mL | Freq: Two times a day (BID) | INTRAVENOUS | Status: DC
  Administered 2019-06-29 – 2019-07-05 (×10): 10 mL

## 2019-06-28 NOTE — Plan of Care (Signed)
  Problem: Activity: Goal: Risk for activity intolerance will decrease Outcome: Progressing   Problem: Nutrition: Goal: Adequate nutrition will be maintained Outcome: Progressing   Problem: Pain Managment: Goal: General experience of comfort will improve Outcome: Progressing   Problem: Safety: Goal: Ability to remain free from injury will improve Outcome: Progressing   

## 2019-06-28 NOTE — Progress Notes (Addendum)
Peripherally Inserted Central Catheter/Midline Placement  The IV Nurse has discussed with the patient and/or persons authorized to consent for the patient, the purpose of this procedure and the potential benefits and risks involved with this procedure.  The benefits include less needle sticks, lab draws from the catheter, and the patient may be discharged home with the catheter. Risks include, but not limited to, infection, bleeding, blood clot (thrombus formation), and puncture of an artery; nerve damage and irregular heartbeat and possibility to perform a PICC exchange if needed/ordered by physician.  Alternatives to this procedure were also discussed.  Bard Power PICC patient education guide, fact sheet on infection prevention and patient information card has been provided to patient /or left at bedside.  Brother signed consent due to fentanyl given IV.  Patient agreeable to PICC  PICC/Midline Placement Documentation  PICC Double Lumen 06/28/19 PICC Right Brachial 43 cm 2 cm (Active)  Indication for Insertion or Continuance of Line Administration of hyperosmolar/irritating solutions (i.e. TPN, Vancomycin, etc.) 06/28/19 1842  Exposed Catheter (cm) 2 cm 06/28/19 1842  Site Assessment Clean;Dry;Intact 06/28/19 1842  Lumen #1 Status Flushed;Saline locked;Blood return noted 06/28/19 1842  Lumen #2 Status Flushed;Saline locked;Blood return noted 06/28/19 1842  Dressing Type Transparent 06/28/19 1842  Dressing Status Clean;Dry;Intact;Antimicrobial disc in place 06/28/19 1842  Dressing Change Due 07/05/19 06/28/19 1842       Ethelda Chick 06/28/2019, 6:45 PM

## 2019-06-28 NOTE — Progress Notes (Addendum)
PHARMACY - TOTAL PARENTERAL NUTRITION CONSULT NOTE   Indication: infected pancreatic pseudocyst  Patient Measurements: Height: 5\' 11"  (180.3 cm) Weight: 276 lb 3.8 oz (125.3 kg) IBW/kg (Calculated) : 75.3 TPN AdjBW (KG): 88.5 Body mass index is 38.53 kg/m.  Assessment: 53 yom on chronic TPN presenting 06/27/19 with diarrhea, 10/10 abdominal pain, N/V. PMH significant for uncontrolled DM2, gout, HTN, pancreatitis, history of noncompliance. Patient recently discharged 06/22/19 after admission for DKA, AKI, and acute pancreatitis with infected pseudocyst, s/p multiple EGD, necrosectomy procedures with cyst gastrostomy. Patient was discharged on doxy and metronidazole. TPN was reduced during previous hospitalization to 1/2 dose on discharge to supplement PO diet. Antibiotics have been broadened and GI consulted. Patient reports no missed days of TPN or toleration issues PTA, last 3/13.  Glucose / Insulin: hx DM, A1c improved to 8.7% (previously 13.2%). CBGs elevated 300-400s initially, improved to 150-200s on Lantus 11 units daily + SSI (utilized 12 units since 3/13 PM) Electrolytes: all WNL on presentation Renal: SCr and BUN WNL LFTs / TGs: LFTs / Tbili / TG WNL Prealbumin / albumin: Pre-albumin 14.1, albumin 2.3 Intake / Output; MIVF: LBM 3/12; NS at 75 ml/hr GI Imaging: 3/13 CT abd/pelvis: residual persistent collections Surgeries / Procedures: none this admit  Central access: PICC placed 06/05/19 TPN start date: chronic TPN PTA; 3/14 inpatient  Current TPN Rx [facility]): -15% Amino Acid 71 g/day, Dextrose 154 g/day, Lipids 36 g/day - provides 1168 kcal/day -Electrolytes:  Ca gluconate 5 meq, Mag sulfate 19.2 meq, Potassium Acetate 0 meq, Potassium Cl 5 meq, K Phosphate 10 mmol, Sodium Acetate 0 meq, Sodium Cl 77 meq, Sodium Phosphate 0 mmol -Additives:  Trace elements 1 mL, MVI 10 mL, Regular insulin 45 units -Administer over 12 hrs with 1 hr ramp up and 1 hr ramp down  Nutritional Goals (per  last RD recommendation on 3/4): kCal: 2300-2500, Protein: 130-145 g, Fluid: >2L/day  Current Nutrition:  NPO  Plan:  Resume PTA concentrated cyclic 1/2 dose TPN at 1800. Cycle 1069mL/hr over 12 hrs:  Infuse 45 mL/hr x 1 hr, then 91 mL/hr x 10 hrs, then 45 mL/hr x 1 hr. This TPN provides 71g AA, 155g CHO, 36g ILE for total 1171 kcal, meeting 50% of patient needs -May need to consider adjusting TPN if patient remains unable to take PO Electrolytes in TPN: 40mEq/L of Na, 18mEq/L of K, 24mEq/L of Ca, 55mEq/L of Mg, and 77mmol/L of Phos. Cl:Ac ratio 1:1 Add MVI MWF only due to national shortage and trace elements daily to TPN D/c Lantus, as is safer to give all insulin in TPN bag. Add regular insulin 30 units to TPN bag (45 units PTA, will start lower to be safe with patient NPO) + change SSI to Moderate q4h and adjust as needed Continue NS at 75 mL/hr for now Monitor TPN labs on Mon/Thurs, GI plans, improvement of N/V and ability to tolerate PO   9m, PharmD, BCPS Please check AMION for all [facility] contact numbers Clinical Pharmacist 06/28/2019 8:28 AM

## 2019-06-28 NOTE — Progress Notes (Signed)
PROGRESS NOTE    Caleb Chambers  XVQ:[id number] DOB: 08-Aug-1965 DOA: 06/27/2019 PCP: Patient, No Pcp Per   Brief Narrative:  Caleb Chambers is a 54 y.o. male with medical history significant for but not limited history of uncontrolled diabetes mellitus type 2 with a history of DKA, gout, hypertension, morbid obesity, pancreatitis, history of noncompliance who was recently just discharged from the hospital on 06/22/2018 where is found and admitted for diabetic ketoacidosis, AKI and acute pancreatitis with pseudocyst.  During that hospitalization he underwent multiple EGDs with cystogastrostomy tube creation and double-pigtail replacements.  He underwent multiple EGDs as well as necrosectomy treated for his acute pancreatitis with necrosis and infected pseudocyst with staph aureus.  He was placed on vancomycin and Flagyl and transition to doxycycline metronidazole at discharge.  Plan was for him to new antibiotics until repeat CT of the abdomen and pelvis in 2 weeks.  Patient was on TPN during that hospitalization and it was reduced to half the dose.  He now presents again diarrhea, nausea, vomiting and 10 out of 10 abdominal pain which was initially dull and stabbing and now is just throbbing.  Patient states that he woke up with diarrhea around 2 AM and had multiple episodes.  Tried to maintain his fluid balance and started vomiting and started having abdominal pain.  Was unable to keep anything down.  He called the gastroenterology office and he was advised to come to the ED for further evaluation recommendations.  In the ED he had a CT of the abdomen pelvis done and he had some abdominal pain that was radiating to the left.  Repeat CT of the abdomen pelvis was done and showed that there is still some residual persistent collections that were definitely not drained by the catheters adjacent to the uncinate process.  Patient clinically is not septic appearing but is complaining of significant abdominal pain.   Because of his uncontrolled symptoms he will be brought into the hospital for further evaluation and will be consulted for further recommendations.  ED Course: In the ED had basic blood work done as well as a CT of the abdomen and pelvis.  He was given a liter of normal saline and given pain control with hydromorphone and antiemetics with promethazine.  I started the patient on antibiotics with vancomycin, ceftriaxone and Flagyl and ordered blood cultures.  I asked the EDP to call GI and she is going to call the gastroenterologist on call for further evaluation.  **Interim History Gastroenterology was consulted for further evaluation recommendations and they are recommending continued supportive care with TPN and IV fluids as well as continue antibiotics for now.  They recommend that once patient able to start Creon to restart at 72,000 units and 36,000 units with snacks.  Assessment & Plan:   Active Problems:   Morbid obesity (HCC)   Abdominal pain, epigastric   Abnormal CT of the abdomen   Protein-calorie malnutrition, severe   Pancreatic necrosis   Infected pancreatic pseudocyst   Intractable nausea and vomiting   Abdominal pain   Diabetes mellitus type 2 in obese (HCC)  Nausea vomiting diarrhea as well as abdominal pain in the setting of below rule out other etiologies including infectious -Admit to inpatient telemetry -CT of the abdomen pelvis as below -We will make the patient n.p.o. but GI advance diet to clear -Status post 2 L of fluid in the ED. Then will place on normal saline at 75 MLS per hour -We will start TPN  again given his concern for recurrent pancreatitis with a pseudocyst -Check blood cultures x2 and a GI pathogen panel; GI is also recommending adding a C. difficile via PCR -I clinically do not suspect C. difficile given the patient is afebrile and has no leukocytosis but GI wants to rule this out -Resume antibiotics as below and is now on IV vancomycin, IV IV  ceftriaxone and IV Flagyl -Antiemetics with Zofran and GI recommending scheduling every 8 hours -GI evaluated and they feel that he has no significant changes on CT and recommending continuing with supportive care for now with TPN, IV fluids, IV antibiotics and scheduled Zofran -Unfortunately TPN could not be initiated due to his PICC not being long enough for this so PICC line will be discontinued and a new one will be placed tomorrow.  PICC team evaluated and recommended this and will do this tomorrow -Continue pain control with IV fentanyl 25 mcg but may need to adjust further  Recent Acute on Chronic Pancreatitis with Necrosis infected pseudocyst with staph aureus -Recently discharged on 06/22/2019 and he had Cystogastrostomy tube, EGDs and necrosectomy -Lipase level was 30 on admission -CT of the abdomen pelvis showed "Two cyst gastrostomy catheters decompressing previous large pancreatic pseudocyst. Residual ill-defined pancreatic low-density in the region of catheters. Two small heterogeneous collections persist that are not definitively drained by catheters, 2.5 x 1.7 cm adjacent to the pancreatic tail, and 4.8 x 2.8 cm collection adjacent to the uncinate process. Liquid and solid stool throughout the colon, difficult to evaluate for perienteric fluid collections. Fluid in the subhepatic right upper quadrant appears to represent fluid within tortuous colon rather than a discrete pericolonic fluid collection. Portions of the mid splenic vein are not opacified and likely occluded. No visualized thrombus. Hepatic steatosis." -Resume Creon when able -We will reconsult Gastroenterology for further evaluation recommendations and they are recommending continue with supportive care as above with TPN, IV fluids, IV antibiotics and scheduled Zofran -Currently will make him n.p.o. and patient will be started back on antibiotics with IV vancomycin, IV ceftriaxone and Flagyl -Continue with normal saline at  75 mils per hour -We will have pharmacy consulted for TPN administration given the patient will be n.p.o. and TPN is to be administered tonight however PICC line will need to be removed due to The PICC Line bing use were tried with vancomycin or the TPN  -Continue to monitor his pain status  Uncontrolled diabetes mellitus type 2 with hyperglycemia -Last hemoglobin A1c was 13.2 and now is 8.7 -Currently not in DKA as anion gap is normal but he does have a small metabolic acidosis -IV fluids as above -Hold Metformin XR 1000 units p.o. twice daily -Resume Lantus 11 units daily and will place on sensitive NovoLog sliding scale every 4 for now -We will order TPN for the patient given that we will make him n.p.o. and will resume Lantus in the TPN next -Continue monitor CBGs carefully admission was 416 -Repeat CBGs have been ranging from 114-339 are trending down today on Lantus and sliding scale  Essential Hypertension  -Blood pressures were not elevated but will hold his antihypertensives of lisinopril, amlodipine and budesonide -We will resume when he is taking p.o. but blood pressures are doing well without any antihypertensives and last blood pressure was 578/46  Metabolic Acidosis -As above -Patient CO2 was 18, anion gap is 13, and chloride was 100; repeat was showing a CO2 of 22, anion gap of 10, chloride level 103 -Continue IV fluid hydration  as above and repeat CMP in a.m.  Hyponatremia/Pseudohyponatremia  -Patient's sodium admission was 131 and likely low in the setting of hypoglycemia is now improved -Patient sodium is now 135 -Improved glycemic control and continue with IV fluid hydration and repeat CMP in a.m.  Severe protein calorie malnutrition  -We will obtain nutritionist consultation -Resume TPN however unfortunately his PICC line needs to be changed  Bilateral lower extremity edema -Likely in the setting of malnutrition, anemia and hypoalbuminemia with questionable  venous insufficiency  -Hold Bumex and continue to encourage leg elevation -We will need to continue to ambulate  Gout -Resume allopurinol when able and take p.o.  GERD -We will change PPI to IV  Obesity -Estimated body mass index is 38.53 kg/m as calculated from the following:   Height as of this encounter:  (1.803 m).   Weight as of this encounter: 125.3 kg. -Weight Loss and Dietary Counseling given  DVT prophylaxis: Enoxaparin 65 mg subcu every 24 Code Status: FULL CODE Family Communication: No family present at bedside Disposition Plan: Patient is from home now requiring hospitalization due to intractable nausea, vomiting pending further improvement and tolerance of diet and evaluation by GI and  Consultants:   Gastroenterology   Procedures: CT Scan   Antimicrobials:  Anti-infectives (From admission, onward)   Start     Dose/Rate Route Frequency Ordered Stop   06/27/19 1830  cefTRIAXone (ROCEPHIN) 2 g in sodium chloride 0.9 % 100 mL IVPB     2 g 200 mL/hr over 30 Minutes Intravenous Every 24 hours 06/27/19 1823     06/27/19 1830  vancomycin (VANCOREADY) IVPB 1500 mg/300 mL     1,500 mg 150 mL/hr over 120 Minutes Intravenous Every 12 hours 06/27/19 1823     06/27/19 1745  metroNIDAZOLE (FLAGYL) IVPB 500 mg     500 mg 100 mL/hr over 60 Minutes Intravenous Every 8 hours 06/27/19 1744       Subjective: Seen and examined at bedside and he states that he is still having some abdominal pain but was feeling a little bit better.  Has not had any more diarrhea or nausea..  Denies any lightheadedness or dizziness.  No other concerns or plans at this time.  Objective: Vitals:   06/27/19 2019 06/28/19 0448 06/28/19 0457 06/28/19 0754  BP: 129/74 122/77  119/75  Pulse: 84 83  87  Resp: (!) Temp: 97.9 F (36.6 C) 98.3 F (36.8 C)  98.6 F (37 C)  TempSrc: Oral Oral  Oral  SpO2: 100% 96%  100%  Weight:   125.3 kg   Height:        Intake/Output  Summary (Last 24 hours) at 06/28/2019 1300 Last data filed at 06/28/2019 0506 Gross per 24 hour  Intake 1833.34 ml  Output 650 ml  Net 1183.34 ml   Filed Weights   06/27/19 1306 06/28/19 0457  Weight: 128 kg 125.3 kg   Examination: Physical Exam:  Constitutional: WN/WD obese African-American male currently in no acute distress and appears little bit more comfortable than yesterday but still complaining some abdominal pain Eyes: Lids and conjunctivae normal, sclerae anicteric  ENMT: External Ears, Nose appear normal. Grossly normal hearing.  Neck: Appears normal, supple, no cervical masses, normal ROM, no appreciable thyromegaly no JVD Respiratory: Diminished to auscultation bilaterally, no wheezing, rales, rhonchi or crackles. Normal respiratory effort and patient is not tachypenic. No accessory muscle use.  Unlabored breathing and not wearing any supplemental oxygen via nasal  cannula Cardiovascular: RRR, no murmurs / rubs / gallops. S1 and S2 auscultated.  Has 1+ lower extremity edema bilaterally Abdomen: Soft, tender to palpate in abdomen, distended secondary to body habitus. Bowel sounds positive.  GU: Deferred. Musculoskeletal: No clubbing / cyanosis of digits/nails. No joint deformity upper and lower extremities.  Skin: No rashes, lesions, ulcers on a limited skin evaluation. No induration; Warm and dry.  Neurologic: CN 2-12 grossly intact with no focal deficits. Sensation intact in all. Romberg sign and cerebellar reflexes not assessed.  Psychiatric: Normal judgment and insight. Alert and oriented x 3. Normal mood and appropriate affect.   Data Reviewed: I have personally reviewed following labs and imaging studies  CBC: Recent Labs  Lab 06/21/19 1203 06/22/19 0425 06/27/19 1316 06/27/19 1546 06/27/19 2326  WBC  --  6.9 9.3  --  5.3  NEUTROABS  --  3.8 8.3*  --  3.2  HGB 8.5* 8.1* 9.1* 9.9* 7.8*  HCT 27.4* 26.7* 31.4* 29.0* 25.3*  MCV  --  83.2 87.0  --  81.9  PLT  --   240 230  --  225   Basic Metabolic Panel: Recent Labs  Lab 06/22/19 0425 06/27/19 1316 06/27/19 1546 06/27/19 2326  NA 133* 131* 134* 135  K 3.8 3.8 3.9 3.7  CL 100 100  --  103  CO2 24 18*  --  22  GLUCOSE 165* 416*  --  260*  BUN 12 10  --  8  CREATININE 0.60* 0.63  --  0.62  CALCIUM 8.5* 8.6*  --  8.6*  MG 1.7 1.7  --  1.7  PHOS 3.8  --   --  3.7   GFR: Estimated Creatinine Clearance: 145.6 mL/min (by C-G formula based on SCr of 0.62 mg/dL). Liver Function Tests: Recent Labs  Lab 06/22/19 0425 06/27/19 1316 06/27/19 2326  AST 11* 11* 11*  ALT 8 9 8   ALKPHOS 92 99 87  BILITOT 0.3 0.5 0.3  PROT 7.2 7.4 6.8  ALBUMIN 2.2* 2.6* 2.3*   Recent Labs  Lab 06/27/19 1316  LIPASE 30   No results for input(s): AMMONIA in the last 168 hours. Coagulation Profile: No results for input(s): INR, PROTIME in the last 168 hours. Cardiac Enzymes: No results for input(s): CKTOTAL, CKMB, CKMBINDEX, TROPONINI in the last 168 hours. BNP (last 3 results) No results for input(s): PROBNP in the last 8760 hours. HbA1C: Recent Labs    06/27/19 2326  HGBA1C 8.7*   CBG: Recent Labs  Lab 06/27/19 1907 06/27/19 2025 06/28/19 0017 06/28/19 0444 06/28/19 0752  GLUCAP 347* 339* 232* 161* 150*   Lipid Profile: Recent Labs    06/27/19 2326  TRIG 86   Thyroid Function Tests: Recent Labs    06/27/19 2326  TSH 1.243   Anemia Panel: No results for input(s): VITAMINB12, FOLATE, FERRITIN, TIBC, IRON, RETICCTPCT in the last 72 hours. Sepsis Labs: Recent Labs  Lab 06/27/19 1523  LATICACIDVEN 1.2    Recent Results (from the past 240 hour(s))  SARS CORONAVIRUS 2 (TAT 6-24 HRS) Nasopharyngeal Nasopharyngeal Swab     Status: None   Collection Time: 06/27/19  5:27 PM   Specimen: Nasopharyngeal Swab  Result Value Ref Range Status   SARS Coronavirus 2 NEGATIVE NEGATIVE Final    Comment: (NOTE) SARS-CoV-2 target nucleic acids are NOT DETECTED. The SARS-CoV-2 RNA is generally  detectable in upper and lower respiratory specimens during the acute phase of infection. Negative results do not preclude SARS-CoV-2 infection, do not  rule out co-infections with other pathogens, and should not be used as the sole basis for treatment or other patient management decisions. Negative results must be combined with clinical observations, patient history, and epidemiological information. The expected result is Negative. Fact Sheet for Patients: HairSlick.no Fact Sheet for Healthcare Providers: quierodirigir.com This test is not yet approved or cleared by the [place] FDA and  has been authorized for detection and/or diagnosis of SARS-CoV-2 by FDA under an Emergency Use Authorization (EUA). This EUA will remain  in effect (meaning this test can be used) for the duration of the COVID-19 declaration under Section 56 4(b)(1) of the Act, 21 U.S.C. section 360bbb-3(b)(1), unless the authorization is terminated or revoked sooner. Performed at [facility], [address] [place], [place] [address]      RN Pressure Injury Documentation:     Estimated body mass index is 38.53 kg/m as calculated from the following:   Height as of this encounter: 5\' 11"  (1.803 m).   Weight as of this encounter: 125.3 kg.  Malnutrition Type:      Malnutrition Characteristics:      Nutrition Interventions:     Radiology Studies: CT ABDOMEN PELVIS W CONTRAST  Result Date: 06/27/2019 CLINICAL DATA:  Worsening abdominal pain and nausea. Recent admission for pancreatitis with pseudocyst. EXAM: CT ABDOMEN AND PELVIS WITH CONTRAST TECHNIQUE: Multidetector CT imaging of the abdomen and pelvis was performed using the standard protocol following bolus administration of intravenous contrast. CONTRAST:  100 cc Omnipaque 300 IV COMPARISON:  Most recent CT 05/25/2019 FINDINGS: Lower chest: Hypoventilatory atelectasis. No pleural fluid.  Prominent heart size. Hepatobiliary: Diffusely decreased hepatic density consistent with steatosis. No evidence of focal liver lesion or intrahepatic fluid collection. Clips in the gallbladder fossa postcholecystectomy. No biliary dilatation. Pancreas: 2 cyst gastrostomy tubes in place decompressing previous large peripancreatic collection. Majority of the peripancreatic collection has resolved with residual ill-defined soft tissue density about the body and tail. Small pocket centrally with mottled air measures 2.5 x 1.7 cm, series 3, image 38. Residual collection in the uncinate process with heterogeneous air in fluid measures 4.7 x 2.8 cm, series 3, image 53. Mild generalized peripancreatic fat stranding. Pancreatic duct is not well-defined. Spleen: Mildly enlarged spanning 14.5 cm. Mid splenic vein is not opacified and presumably occluded. No focal intrasplenic abnormality. Adrenals/Urinary Tract: Normal adrenal glands. Slight left renal heterogeneity is likely reactive. No hydronephrosis. No perinephric edema. Urinary bladder is physiologically distended. No bladder wall thickening. Stomach/Bowel: Bowel evaluation is limited in the absence of enteric contrast. 2 cyst gastrostomy tubes in the stomach. Gastric wall is poorly defined. There is fluid within the duodenum. No small bowel dilatation or obstruction. Mild edema of small bowel loops in the left abdomen, likely reactive. Liquid and solid stool throughout the colon. Liquid colonic stool makes detailed assessment for perienteric fluid collections difficult. Fluid in the subhepatic right upper quadrant appears to represent fluid within tortuous colon rather than a discrete pericolonic fluid collection. Fat lobule abutting the sigmoid colon was seen on prior exam likely sequela of prior epiploic appendagitis. The appendix is normal. Vascular/Lymphatic: Portions of the mid splenic vein are not opacified and likely occluded. No visualized thrombus. The portal  vein is patent. Multiple prominent peripancreatic and retroperitoneal nodes, likely reactive. Aorta bi-iliac atherosclerosis. Reproductive: Prostate is unremarkable. Other: Large pancreatic pseudocyst has improved post cyst-gastrostomy. Small residual collections as described above. Small volume of perihepatic ascites. Small amount of free fluid tracks in the pericolic gutters. Central  mesenteric edema. Musculoskeletal: Degenerative change in the spine. There are no acute or suspicious osseous abnormalities. IMPRESSION: 1. Two cyst gastrostomy catheters decompressing previous large pancreatic pseudocyst. Residual ill-defined pancreatic low-density in the region of catheters. Two small heterogeneous collections persist that are not definitively drained by catheters, 2.5 x 1.7 cm adjacent to the pancreatic tail, and 4.8 x 2.8 cm collection adjacent to the uncinate process. 2. Liquid and solid stool throughout the colon, difficult to evaluate for perienteric fluid collections. Fluid in the subhepatic right upper quadrant appears to represent fluid within tortuous colon rather than a discrete pericolonic fluid collection. 3. Portions of the mid splenic vein are not opacified and likely occluded. No visualized thrombus. 4. Hepatic steatosis. Aortic Atherosclerosis (ICD10-I70.0). Electronically Signed   By: Narda Rutherford M.D.   On: 06/27/2019 16:24   DG Chest Portable 1 View  Result Date: 06/27/2019 CLINICAL DATA:  PICC placement EXAM: PORTABLE CHEST 1 VIEW COMPARISON:  04/23/2019 and older studies FINDINGS: Right PICC tip projects near the expected confluence of the right subclavian vein and superior vena cava. Heart, mediastinum and hila are unremarkable. The lungs are clear. No pleural effusion or pneumothorax. Skeletal structures are grossly intact. IMPRESSION: 1. Right PICC tip projects at the confluence of the right subclavian vein and superior vena cava. 2. No active cardiopulmonary disease. Electronically  Signed   By: Amie Portland M.D.   On: 06/27/2019 13:42   Scheduled Meds: . allopurinol  300 mg Oral Daily  . Chlorhexidine Gluconate Cloth  6 each Topical Daily  . enoxaparin (LOVENOX) injection  0.5 mg/kg Subcutaneous Q24H  . folic acid  1 mg Oral Daily  . insulin aspart  0-15 Units Subcutaneous Q4H  . lipase/protease/amylase  36,000 Units Oral TID AC  . pantoprazole  40 mg Oral Daily  . sodium chloride flush  10-40 mL Intracatheter Q12H   Continuous Infusions: . sodium chloride 75 mL/hr at 06/28/19 1021  . cefTRIAXone (ROCEPHIN)  IV 2 g (06/27/19 2210)  . metronidazole 500 mg (06/28/19 1022)  . TPN CYCLIC-ADULT (ION)    . vancomycin 1,500 mg (06/27/19 2227)    LOS: 1 day   Merlene Laughter, DO [facility] PAGER is on AMION  If 7PM-7AM, please contact night-coverage [email or web address]

## 2019-06-28 NOTE — Consult Note (Addendum)
Referring Provider:  EDP Primary Care Physician:  Patient, No Pcp Per Primary Gastroenterologist:  Dr. Etta Quill GI cross covering for Dr. Mann/Dr. Elnoria Howard for weekend coverage.  They will resume his care on Monday, March 15.  Reason for Consultation:  Abdominal pain, recent necrotizing pancreatitis  HPI: Caleb Chambers is a 54 y.o. male with medical history significant for uncontrolled diabetes mellitus type 2 with a history of DKA, gout, hypertension, morbid obesity, pancreatitis, history of noncompliance who was recently just discharged from the hospital on 06/22/2018 where he was admitted for diabetic ketoacidosis, AKI, and acute pancreatitis with pseudocyst.  Since February he has underwent undergone extensive GI procedures, most recently cystogastrostomy tube creation and double-pigtail replacements as well as necrosectomy for his acute pancreatitis with necrosis and infected pseudocyst with staph aureus.  He was placed on vancomycin and Flagyl and transition to doxycycline metronidazole at discharge.  Plan was for him to continue antibiotics until repeat CT of the abdomen and pelvis in 2 weeks (was supposed to be this week).  Patient was on TPN during that hospitalization and it was reduced to half the dose.  He now presents to [facility] hospital again with complaints of diarrhea, nausea, vomiting and 10 out of 10 abdominal pain.  Says that the diarrhea has been profuse since Friday night, nonstop.  Multiple episodes of vomiting as well.  Upon evaluation laboratory studies really unchanged and unremarkable.  CT scan of the abdomen/pelvis with contrast showed the following:  IMPRESSION: 1. Two cyst gastrostomy catheters decompressing previous large pancreatic pseudocyst. Residual ill-defined pancreatic low-density in the region of catheters. Two small heterogeneous collections persist that are not definitively drained by catheters, 2.5 x 1.7 cm adjacent to the pancreatic tail, and 4.8 x 2.8 cm  collection adjacent to the uncinate process. 2. Liquid and solid stool throughout the colon, difficult to evaluate for perienteric fluid collections. Fluid in the subhepatic right upper quadrant appears to represent fluid within tortuous colon rather than a discrete pericolonic fluid collection. 3. Portions of the mid splenic vein are not opacified and likely occluded. No visualized thrombus. 4. Hepatic steatosis.  Aortic Atherosclerosis (ICD10-I70.0).  He has been started on Rocephin and vancomycin IV.  Stool studies for GI pathogen panel have been ordered.  At the time of our visit he is looking somewhat better, resting in bed.  Last procedure 3/3 by Dr. Meridee Score:  - No gross lesions in esophagus. - Pre-existing AXIOS cystgastrostomy stent and double pigtails present. Double pigtails removed. Pancreatic Necrosectomy performed for nearly 3 hours. AXIOS removed. Double pigtails replaced. - Erythematous mucosa in the stomach. - No gross lesions in the duodenal bulb. Duodenal deformity in second portion.    Past Medical History:  Diagnosis Date  . Diabetes mellitus without complication (HCC)   . DKA (diabetic ketoacidoses) (HCC) 05/12/2019  . Gout   . Hypertension   . Pancreatitis     Past Surgical History:  Procedure Laterality Date  . BILIARY STENT PLACEMENT  05/28/2019   Procedure: BILIARY STENT PLACEMENT;  Surgeon: Meridee Score Netty Starring., MD;  Location: [facility] ENDOSCOPY;  Service: Gastroenterology;;  plastic double pig tail stents placed through cystgastrostomy x2  . CHOLECYSTECTOMY    . ENDOROTOR N/A 06/01/2019   Procedure: [id number];  Surgeon: Mansouraty, Netty Starring., MD;  Location: [facility] ENDOSCOPY;  Service: Gastroenterology;  Laterality: N/A;  . ENDOROTOR N/A 06/03/2019   Procedure: NIDPOEUMP;  Surgeon: Mansouraty, Netty Starring., MD;  Location: [facility] ENDOSCOPY;  Service: Gastroenterology;  Laterality: N/A;  .  ENDOROTOR N/A 06/05/2019   Procedure: CHENIDPOE;  Surgeon:  Mansouraty, Netty Starring., MD;  Location: [facility] ENDOSCOPY;  Service: Gastroenterology;  Laterality: N/A;  . ENDOROTOR  06/08/2019   Procedure: UMPNTIRWE;  Surgeon: Lemar Lofty., MD;  Location: [facility] ENDOSCOPY;  Service: Gastroenterology;;  . Sloan Leiter N/A 06/12/2019   Procedure: [id number];  Surgeon: Mansouraty, Netty Starring., MD;  Location: [facility] ENDOSCOPY;  Service: Gastroenterology;  Laterality: N/A;  . ENDOROTOR N/A 06/15/2019   Procedure: [id number];  Surgeon: Mansouraty, Netty Starring., MD;  Location: [facility] ENDOSCOPY;  Service: Gastroenterology;  Laterality: N/A;  . ENDOROTOR N/A 06/17/2019   Procedure: [id number];  Surgeon: Mansouraty, Netty Starring., MD;  Location: [facility] ENDOSCOPY;  Service: Gastroenterology;  Laterality: N/A;  . ESOPHAGOGASTRODUODENOSCOPY N/A 06/01/2019   Procedure: ESOPHAGOGASTRODUODENOSCOPY (EGD) with NECROSECTOMY;  Surgeon: Mansouraty, Netty Starring., MD;  Location: [facility] ENDOSCOPY;  Service: Gastroenterology;  Laterality: N/A;  . ESOPHAGOGASTRODUODENOSCOPY Bilateral 06/03/2019   Procedure: ESOPHAGOGASTRODUODENOSCOPY (EGD) with Necrosectomy;  Surgeon: Meridee Score Netty Starring., MD;  Location: [facility] ENDOSCOPY;  Service: Gastroenterology;  Laterality: Bilateral;  . ESOPHAGOGASTRODUODENOSCOPY N/A 06/05/2019   Procedure: ESOPHAGOGASTRODUODENOSCOPY (EGD) with Necrosectomy;  Surgeon: Meridee Score Netty Starring., MD;  Location: [facility] ENDOSCOPY;  Service: Gastroenterology;  Laterality: N/A;  . ESOPHAGOGASTRODUODENOSCOPY N/A 06/08/2019   Procedure: ESOPHAGOGASTRODUODENOSCOPY (EGD) with NECROSECTOMY;  Surgeon: Mansouraty, Netty Starring., MD;  Location: [facility] ENDOSCOPY;  Service: Gastroenterology;  Laterality: N/A;  . ESOPHAGOGASTRODUODENOSCOPY N/A 06/12/2019   Procedure: ESOPHAGOGASTRODUODENOSCOPY (EGD) with NECROSECTOMY;  Surgeon: Mansouraty, Netty Starring., MD;  Location: [facility] ENDOSCOPY;  Service: Gastroenterology;  Laterality: N/A;  . ESOPHAGOGASTRODUODENOSCOPY N/A 06/15/2019   Procedure: ESOPHAGOGASTRODUODENOSCOPY (EGD) with  NECROSECTOMY;  Surgeon: Mansouraty, Netty Starring., MD;  Location: [facility] ENDOSCOPY;  Service: Gastroenterology;  Laterality: N/A;  . ESOPHAGOGASTRODUODENOSCOPY N/A 06/17/2019   Procedure: ESOPHAGOGASTRODUODENOSCOPY (EGD) with NECROSECTOMY;  Surgeon: Mansouraty, Netty Starring., MD;  Location: [facility] ENDOSCOPY;  Service: Gastroenterology;  Laterality: N/A;  . ESOPHAGOGASTRODUODENOSCOPY (EGD) WITH PROPOFOL N/A 05/28/2019   Procedure: ESOPHAGOGASTRODUODENOSCOPY (EGD) WITH PROPOFOL;  Surgeon: Meridee Score Netty Starring., MD;  Location: [facility] ENDOSCOPY;  Service: Gastroenterology;  Laterality: N/A;  . EUS Left 05/28/2019   Procedure: UPPER ENDOSCOPIC ULTRASOUND (EUS) LINEAR;  Surgeon: Lemar Lofty., MD;  Location: [facility] ENDOSCOPY;  Service: Gastroenterology;  Laterality: Left;  . PANCREATIC STENT PLACEMENT  05/28/2019   Procedure: PANCREATIC STENT PLACEMENT;  Surgeon: Meridee Score Netty Starring., MD;  Location: [facility] ENDOSCOPY;  Service: Gastroenterology;;  cystgastrostomy with axiod stent placement  . PANCREATIC STENT PLACEMENT  06/01/2019   Procedure: PANCREATIC STENT PLACEMENT;  Surgeon: Meridee Score Netty Starring., MD;  Location: [facility] ENDOSCOPY;  Service: Gastroenterology;;  . PANCREATIC STENT PLACEMENT  06/03/2019   Procedure: PANCREATIC STENT PLACEMENT;  Surgeon: Lemar Lofty., MD;  Location: [facility] ENDOSCOPY;  Service: Gastroenterology;;  double pig tail stents placed through cystgastrostomy  . PANCREATIC STENT PLACEMENT  06/05/2019   Procedure: PANCREATIC STENT PLACEMENT;  Surgeon: Meridee Score Netty Starring., MD;  Location: [facility] ENDOSCOPY;  Service: Gastroenterology;;  . PANCREATIC STENT PLACEMENT  06/08/2019   Procedure: PANCREATIC STENT PLACEMENT;  Surgeon: Lemar Lofty., MD;  Location: [facility] ENDOSCOPY;  Service: Gastroenterology;;  double pigtail cyst gastrostomy   . PANCREATIC STENT PLACEMENT  06/12/2019   Procedure: PANCREATIC STENT PLACEMENT;  Surgeon: Meridee Score Netty Starring., MD;  Location: [facility] ENDOSCOPY;  Service:  Gastroenterology;;  double pigtail stents placed through cystgastrostomy  . PANCREATIC STENT PLACEMENT  06/15/2019   Procedure: PANCREATIC STENT PLACEMENT;  Surgeon: Meridee Score Netty Starring., MD;  Location: [facility] ENDOSCOPY;  Service: Gastroenterology;;  . PANCREATIC STENT PLACEMENT  06/17/2019   Procedure: PANCREATIC STENT  PLACEMENT;  Surgeon: Meridee Score Netty Starring., MD;  Location: [facility] ENDOSCOPY;  Service: Gastroenterology;;  . Francine Graven REMOVAL  06/01/2019   Procedure: STENT REMOVAL;  Surgeon: Lemar Lofty., MD;  Location: [facility] ENDOSCOPY;  Service: Gastroenterology;;  . Francine Graven REMOVAL  06/03/2019   Procedure: STENT REMOVAL;  Surgeon: Lemar Lofty., MD;  Location: [facility] ENDOSCOPY;  Service: Gastroenterology;;  . Francine Graven REMOVAL  06/05/2019   Procedure: STENT REMOVAL;  Surgeon: Lemar Lofty., MD;  Location: [facility] ENDOSCOPY;  Service: Gastroenterology;;  . Francine Graven REMOVAL  06/08/2019   Procedure: STENT REMOVAL;  Surgeon: Lemar Lofty., MD;  Location: [facility] ENDOSCOPY;  Service: Gastroenterology;;  . Francine Graven REMOVAL  06/12/2019   Procedure: STENT REMOVAL;  Surgeon: Lemar Lofty., MD;  Location: [facility] ENDOSCOPY;  Service: Gastroenterology;;  . Francine Graven REMOVAL  06/15/2019   Procedure: STENT REMOVAL;  Surgeon: Lemar Lofty., MD;  Location: [facility] ENDOSCOPY;  Service: Gastroenterology;;  . Francine Graven REMOVAL  06/17/2019   Procedure: STENT REMOVAL;  Surgeon: Lemar Lofty., MD;  Location: [facility] ENDOSCOPY;  Service: Gastroenterology;;    Prior to Admission medications   Medication Sig Start Date End Date Taking? Authorizing Provider  allopurinol (ZYLOPRIM) 300 MG tablet Take 300 mg by mouth daily.    Yes [provider]  amLODipine (NORVASC) 10 MG tablet Take 10 mg by mouth daily. 11/30/16  Yes [provider]  bumetanide (BUMEX) 0.5 MG tablet Take 0.5 mg by mouth daily as needed (fluid).    Yes [provider]  colchicine (COLCRYS) 0.6 MG tablet Take 1 tablet  (0.6 mg total) by mouth 2 (two) times daily. Needs office visit (second notice) Patient taking differently: Take 0.6 mg by mouth daily as needed (gout flare up).  10/30/11  Yes Marte, Heather M, PA-C  doxycycline (VIBRA-TABS) 100 MG tablet Take 1 tablet (100 mg total) by mouth every 12 (twelve) hours. 06/22/19  Yes Almon Hercules, MD  folic acid (FOLVITE) 1 MG tablet Take 1 tablet (1 mg total) by mouth daily. 06/22/19  Yes Almon Hercules, MD  lipase/protease/amylase (CREON) 36000 UNITS CPEP capsule Take 1 capsule (36,000 Units total) by mouth 3 (three) times daily before meals. 06/22/19  Yes Almon Hercules, MD  lisinopril (ZESTRIL) 20 MG tablet Take 1 tablet (20 mg total) by mouth daily. 06/22/19  Yes Almon Hercules, MD  metFORMIN (GLUCOPHAGE-XR) 500 MG 24 hr tablet Take 2 tablets (1,000 mg total) by mouth daily with breakfast. 06/22/19  Yes Gonfa, Boyce Medici, MD  metroNIDAZOLE (FLAGYL) 500 MG tablet Take 1 tablet (500 mg total) by mouth every 8 (eight) hours. 06/22/19  Yes Almon Hercules, MD  morphine (MS CONTIN) 15 MG 12 hr tablet Take 1 tablet (15 mg total) by mouth every 12 (twelve) hours for 7 days. 06/22/19 06/29/19 Yes Almon Hercules, MD  pantoprazole (PROTONIX) 40 MG tablet Take 1 tablet (40 mg total) by mouth daily. 06/22/19  Yes Almon Hercules, MD  potassium chloride (KLOR-CON) 10 MEQ tablet Take 1 tablet (10 mEq total) by mouth daily. 06/22/19  Yes Almon Hercules, MD  sterile water SOLN with amino acids 10 % SOLN 1.3 g/kg, dextrose 70 % SOLN 20 % Inject into the vein See admin instructions. Provided by [facility] - administer via PICC line 7:30pm - 7:30am   Yes [provider]  HUMIRA PEN 40 MG/0.4ML PNKT 40 mg by Subdermal route every 14 (fourteen) days.  02/09/19   [provider]  insulin aspart (NOVOLOG) 100 UNIT/ML  FlexPen Inject 3 Units into the skin 3 (three) times daily with meals. 06/22/19   Mercy Riding, MD    Current Facility-Administered Medications  Medication Dose Route Frequency Provider  Last Rate Last Admin  . 0.9 %  sodium chloride infusion   Intravenous Continuous Raiford Noble [place], [place] 75 mL/hr at 06/27/19 2011 New Bag at 06/27/19 2011  . acetaminophen (TYLENOL) tablet 650 mg  650 mg Oral Q6H PRN Raiford Noble Latif, DO       Or  . acetaminophen (TYLENOL) suppository 650 mg  650 mg Rectal Q6H PRN Raiford Noble Latif, DO      . allopurinol (ZYLOPRIM) tablet 300 mg  300 mg Oral Daily Raiford Noble [place], DO   300 mg at 06/27/19 2204  . cefTRIAXone (ROCEPHIN) 2 g in sodium chloride 0.9 % 100 mL IVPB  2 g Intravenous Q24H Duanne Limerick, RPH 200 mL/hr at 06/27/19 2210 2 g at 06/27/19 2210  . Chlorhexidine Gluconate Cloth 2 % PADS 6 each  6 each Topical Daily Sheikh, Omair Latif, DO      . enoxaparin (LOVENOX) injection 65 mg  0.5 mg/kg Subcutaneous Q24H Raiford Noble [place], DO   65 mg at 06/27/19 2153  . fentaNYL (SUBLIMAZE) injection 25 mcg  25 mcg Intravenous Q2H PRN Raiford Noble [place], DO   25 mcg at 06/28/19 0041  . folic acid (FOLVITE) tablet 1 mg  1 mg Oral Daily Raiford Noble [place], DO   1 mg at 06/27/19 2204  . insulin aspart (novoLOG) injection 0-9 Units  0-9 Units Subcutaneous Q4H Raiford Noble [place], [place]   2 Units at 06/28/19 0453  . insulin glargine (LANTUS) injection 11 Units  11 Units Subcutaneous Daily Raiford Noble [place], [place]   11 Units at 06/27/19 2155  . lipase/protease/amylase (CREON) capsule 36,000 Units  36,000 Units Oral TID AC Sheikh, Omair Latif, DO      . metroNIDAZOLE (FLAGYL) IVPB 500 mg  500 mg Intravenous [address] [place], DO 100 mL/hr at 06/28/19 0048 500 mg at 06/28/19 0048  . ondansetron (ZOFRAN) tablet 4 mg  4 mg Oral Q6H PRN Raiford Noble Latif, DO       Or  . ondansetron [facility]) injection 4 mg  4 mg Intravenous Q6H PRN Raiford Noble Latif, DO      . oxyCODONE (Oxy IR/ROXICODONE) immediate release tablet 5 mg  5 mg Oral Q4H PRN Raiford Noble [place], DO   5 mg at 06/28/19 0502  . pantoprazole (PROTONIX) EC tablet 40 mg  40 mg Oral Daily  Raiford Noble [place], DO   40 mg at 06/27/19 2205  . sodium chloride flush (NS) 0.9 % injection 10-40 mL  10-40 mL Intracatheter Q12H Sheikh, Omair Latif, DO      . sodium chloride flush (NS) 0.9 % injection 10-40 mL  10-40 mL Intracatheter PRN Sheikh, Omair Latif, DO      . vancomycin (VANCOREADY) IVPB 1500 mg/300 mL  1,500 mg Intravenous Q12H Duanne Limerick, RPH 150 mL/hr at 06/27/19 2227 1,500 mg at 06/27/19 2227    Allergies as of 06/27/2019 - Review Complete 06/27/2019  Allergen Reaction Noted  . Penicillins Anaphylaxis, Hives, Swelling, and Rash 09/12/2018  . Metoprolol Other (See Comments) 02/08/2014  . Clindamycin Rash and Other (See Comments) 09/30/2013    Family History  Problem Relation Age of Onset  . Healthy Mother   . Hypertension Father     Social History   Socioeconomic History  . Marital status: Single  Spouse name: Not on file  . Number of children: Not on file  . Years of education: Not on file  . Highest education level: Not on file  Occupational History  . Not on file  Tobacco Use  . Smoking status: Never Smoker  . Smokeless tobacco: Never Used  Substance and Sexual Activity  . Alcohol use: Not Currently  . Drug use: Not on file  . Sexual activity: Not Currently  Other Topics Concern  . Not on file  Social History Narrative  . Not on file   Social Determinants of Health   Financial Resource Strain:   . Difficulty of Paying Living Expenses:   Food Insecurity:   . Worried About Programme researcher, broadcasting/film/video in the Last Year:   . Barista in the Last Year:   Transportation Needs:   . Freight forwarder (Medical):   [name] Lack of Transportation (Non-Medical):   Physical Activity:   . Days of Exercise per Week:   . Minutes of Exercise per Session:   Stress:   . Feeling of Stress :   Social Connections:   . Frequency of Communication with Friends and Family:   . Frequency of Social Gatherings with Friends and Family:   . Attends Religious  Services:   . Active Member of Clubs or Organizations:   . Attends Banker Meetings:   [name] Marital Status:   Intimate Partner Violence:   . Fear of Current or Ex-Partner:   . Emotionally Abused:   [name] Physically Abused:   . Sexually Abused:     Review of Systems: ROS is O/W negative except as mentioned in HPI.  Physical Exam: Vital signs in last 24 hours: Temp:  [97.7 F (36.5 C)-98.6 F (37 C)] 98.6 F (37 C) (03/14 0754) Pulse Rate:  [83-90] 87 (03/14 0754) Resp:  [14-31] 18 (03/14 0754) BP: (119-138)/(74-87) 119/75 (03/14 0754) SpO2:  [96 %-100 %] 100 % (03/14 0754) Weight:  [125.3 kg-128 kg] 125.3 kg (03/14 0457) Last BM Date: 06/26/19 General:  Alert, Well-developed, well-nourished, pleasant and cooperative in NAD Head:  Normocephalic and atraumatic. Eyes:  Sclera clear, no icterus.  Conjunctiva pink. Ears:  Normal auditory acuity. Mouth:  No deformity or lesions.   Lungs:  Clear throughout to auscultation.  No wheezes, crackles, or rhonchi.  Heart:  Regular rate and rhythm; no murmurs, clicks, rubs, or gallops. Abdomen:  Soft, non-distended.  BS present.  Moderate diffuse TTP>in the upper and mid-abdomen. Msk:  Symmetrical without gross deformities. Pulses:  Normal pulses noted. Extremities:  Some lower extremity edema noted. Neurologic:  Alert and oriented x 4;  grossly normal neurologically. Skin:  Intact without significant lesions or rashes. Psych:  Alert and cooperative. Normal mood and affect.  Intake/Output from previous day: 03/13 0701 - 03/14 0700 In: 1833.3 [I.V.:332.5; IV Piggyback:1500.8] Out: 650 [Urine:650]  Lab Results: Recent Labs    06/27/19 1316 06/27/19 1546 06/27/19 2326  WBC 9.3  --  5.3  HGB 9.1* 9.9* 7.8*  HCT 31.4* 29.0* 25.3*  PLT 230  --  225   BMET Recent Labs    06/27/19 1316 06/27/19 1546 06/27/19 2326  NA 131* 134* 135  K 3.8 3.9 3.7  CL 100  --  103  CO2 18*  --  22  GLUCOSE 416*  --  260*  BUN 10  --  8   CREATININE 0.63  --  0.62  CALCIUM 8.6*  --  8.6*   LFT  Recent Labs    06/27/19 2326  PROT 6.8  ALBUMIN 2.3*  AST 11*  ALT 8  ALKPHOS 87  BILITOT 0.3   Studies/Results: CT ABDOMEN PELVIS W CONTRAST  Result Date: 06/27/2019 CLINICAL DATA:  Worsening abdominal pain and nausea. Recent admission for pancreatitis with pseudocyst. EXAM: CT ABDOMEN AND PELVIS WITH CONTRAST TECHNIQUE: Multidetector CT imaging of the abdomen and pelvis was performed using the standard protocol following bolus administration of intravenous contrast. CONTRAST:  100 cc Omnipaque 300 IV COMPARISON:  Most recent CT 05/25/2019 FINDINGS: Lower chest: Hypoventilatory atelectasis. No pleural fluid. Prominent heart size. Hepatobiliary: Diffusely decreased hepatic density consistent with steatosis. No evidence of focal liver lesion or intrahepatic fluid collection. Clips in the gallbladder fossa postcholecystectomy. No biliary dilatation. Pancreas: 2 cyst gastrostomy tubes in place decompressing previous large peripancreatic collection. Majority of the peripancreatic collection has resolved with residual ill-defined soft tissue density about the body and tail. Small pocket centrally with mottled air measures 2.5 x 1.7 cm, series 3, image 38. Residual collection in the uncinate process with heterogeneous air in fluid measures 4.7 x 2.8 cm, series 3, image 53. Mild generalized peripancreatic fat stranding. Pancreatic duct is not well-defined. Spleen: Mildly enlarged spanning 14.5 cm. Mid splenic vein is not opacified and presumably occluded. No focal intrasplenic abnormality. Adrenals/Urinary Tract: Normal adrenal glands. Slight left renal heterogeneity is likely reactive. No hydronephrosis. No perinephric edema. Urinary bladder is physiologically distended. No bladder wall thickening. Stomach/Bowel: Bowel evaluation is limited in the absence of enteric contrast. 2 cyst gastrostomy tubes in the stomach. Gastric wall is poorly defined.  There is fluid within the duodenum. No small bowel dilatation or obstruction. Mild edema of small bowel loops in the left abdomen, likely reactive. Liquid and solid stool throughout the colon. Liquid colonic stool makes detailed assessment for perienteric fluid collections difficult. Fluid in the subhepatic right upper quadrant appears to represent fluid within tortuous colon rather than a discrete pericolonic fluid collection. Fat lobule abutting the sigmoid colon was seen on prior exam likely sequela of prior epiploic appendagitis. The appendix is normal. Vascular/Lymphatic: Portions of the mid splenic vein are not opacified and likely occluded. No visualized thrombus. The portal vein is patent. Multiple prominent peripancreatic and retroperitoneal nodes, likely reactive. Aorta bi-iliac atherosclerosis. Reproductive: Prostate is unremarkable. Other: Large pancreatic pseudocyst has improved post cyst-gastrostomy. Small residual collections as described above. Small volume of perihepatic ascites. Small amount of free fluid tracks in the pericolic gutters. Central mesenteric edema. Musculoskeletal: Degenerative change in the spine. There are no acute or suspicious osseous abnormalities. IMPRESSION: 1. Two cyst gastrostomy catheters decompressing previous large pancreatic pseudocyst. Residual ill-defined pancreatic low-density in the region of catheters. Two small heterogeneous collections persist that are not definitively drained by catheters, 2.5 x 1.7 cm adjacent to the pancreatic tail, and 4.8 x 2.8 cm collection adjacent to the uncinate process. 2. Liquid and solid stool throughout the colon, difficult to evaluate for perienteric fluid collections. Fluid in the subhepatic right upper quadrant appears to represent fluid within tortuous colon rather than a discrete pericolonic fluid collection. 3. Portions of the mid splenic vein are not opacified and likely occluded. No visualized thrombus. 4. Hepatic steatosis.  Aortic Atherosclerosis (ICD10-I70.0). Electronically Signed   By: Narda Rutherford M.D.   On: 06/27/2019 16:24   DG Chest Portable 1 View  Result Date: 06/27/2019 CLINICAL DATA:  PICC placement EXAM: PORTABLE CHEST 1 VIEW COMPARISON:  04/23/2019 and older studies FINDINGS: Right PICC tip projects near the expected  confluence of the right subclavian vein and superior vena cava. Heart, mediastinum and hila are unremarkable. The lungs are clear. No pleural effusion or pneumothorax. Skeletal structures are grossly intact. IMPRESSION: 1. Right PICC tip projects at the confluence of the right subclavian vein and superior vena cava. 2. No active cardiopulmonary disease. Electronically Signed   By: Amie Portland M.D.   On: 06/27/2019 13:42   IMPRESSION:  *Nausea, vomiting, diarrhea, abdominal pain: Imaging really unchanged and lab studies unremarkable.  Need to rule out C. difficile in the setting of recent extensive antibiotic use. *Recent acute on chronic necrotizing pancreatitis with infected pseudocyst s/p necrosectomy and cystgastrostomy tubes with Dr. Meridee Score. -Severe protein-calorie malnutrition  PLAN: -Supportive care for now with IV fluids, antiemetics, pain control, IV antibiotics. -Await results of GI pathogen panel studies.  Have also ordered a C. difficile PCR. -If needed then can consider scheduling Zofran dosing around-the-clock instead of as needed. -Ok for clear liquids if tolerated.  **Patient's care will be assumed by Dr. Mann/Dr. Elnoria Howard on Monday, March 15.  Princella Pellegrini. Zehr  06/28/2019, 8:13 AM    Attending physician's note   I have taken a history, examined the patient and reviewed the chart. I agree with the Advanced Practitioner's note, impression and recommendations.  54 year old male with history of type 2 diabetes, hypertension, morbid obesity, recent hospitalization with complicated acute pancreatitis with pseudocyst s/p cyst gastroostomy and necrosectomy, pseudocyst was  infected with staph aureus.  Represented with worsening abdominal pain nausea, vomiting and diarrhea  No leukocytosis or fever No significant changes on CT, cyst gastrostomy tube in place, has 2 small pseudocysts.  Continue supportive care with TPN, IV fluids Continue antibiotics Pain control Zofran every 8 hours  Once patient is able to tolerate adequate p.o., restart Creon 72,000 units with meals and 36,000 units with snacks  Dr. Lance Coon. Loreta Ave will assume patient's care tomorrow, please call with any questions  K. Scherry Ran , MD [phone]

## 2019-06-29 LAB — GLUCOSE, CAPILLARY
Glucose-Capillary: 135 mg/dL — ABNORMAL HIGH (ref 70–99)
Glucose-Capillary: 145 mg/dL — ABNORMAL HIGH (ref 70–99)
Glucose-Capillary: 173 mg/dL — ABNORMAL HIGH (ref 70–99)
Glucose-Capillary: 65 mg/dL — ABNORMAL LOW (ref 70–99)
Glucose-Capillary: 76 mg/dL (ref 70–99)
Glucose-Capillary: 89 mg/dL (ref 70–99)
Glucose-Capillary: 94 mg/dL (ref 70–99)

## 2019-06-29 LAB — CBC WITH DIFFERENTIAL/PLATELET
Abs Immature Granulocytes: 0.03 10*3/uL (ref 0.00–0.07)
Basophils Absolute: 0 10*3/uL (ref 0.0–0.1)
Basophils Relative: 0 %
Eosinophils Absolute: 0.1 10*3/uL (ref 0.0–0.5)
Eosinophils Relative: 1 %
HCT: 25.5 % — ABNORMAL LOW (ref 39.0–52.0)
Hemoglobin: 7.7 g/dL — ABNORMAL LOW (ref 13.0–17.0)
Immature Granulocytes: 1 %
Lymphocytes Relative: 24 %
Lymphs Abs: 1.3 10*3/uL (ref 0.7–4.0)
MCH: 25.6 pg — ABNORMAL LOW (ref 26.0–34.0)
MCHC: 30.2 g/dL (ref 30.0–36.0)
MCV: 84.7 fL (ref 80.0–100.0)
Monocytes Absolute: 0.3 10*3/uL (ref 0.1–1.0)
Monocytes Relative: 6 %
Neutro Abs: 3.5 10*3/uL (ref 1.7–7.7)
Neutrophils Relative %: 68 %
Platelets: 218 10*3/uL (ref 150–400)
RBC: 3.01 MIL/uL — ABNORMAL LOW (ref 4.22–5.81)
RDW: 18 % — ABNORMAL HIGH (ref 11.5–15.5)
WBC: 5.2 10*3/uL (ref 4.0–10.5)
nRBC: 0 % (ref 0.0–0.2)

## 2019-06-29 LAB — COMPREHENSIVE METABOLIC PANEL
ALT: 9 U/L (ref 0–44)
AST: 11 U/L — ABNORMAL LOW (ref 15–41)
Albumin: 2.1 g/dL — ABNORMAL LOW (ref 3.5–5.0)
Alkaline Phosphatase: 78 U/L (ref 38–126)
Anion gap: 7 (ref 5–15)
BUN: 6 mg/dL (ref 6–20)
CO2: 23 mmol/L (ref 22–32)
Calcium: 8.4 mg/dL — ABNORMAL LOW (ref 8.9–10.3)
Chloride: 108 mmol/L (ref 98–111)
Creatinine, Ser: 0.63 mg/dL (ref 0.61–1.24)
GFR calc Af Amer: >60 mL/min (ref >60)
GFR calc non Af Amer: >60 mL/min (ref >60)
Glucose, Bld: 86 mg/dL (ref 70–99)
Potassium: 2.8 mmol/L — ABNORMAL LOW (ref 3.5–5.1)
Sodium: 138 mmol/L (ref 135–145)
Total Bilirubin: 0.5 mg/dL (ref 0.3–1.2)
Total Protein: 6.4 g/dL — ABNORMAL LOW (ref 6.5–8.1)

## 2019-06-29 LAB — C DIFFICILE QUICK SCREEN W PCR REFLEX
C Diff antigen: NEGATIVE
C Diff interpretation: NOT DETECTED
C Diff toxin: NEGATIVE

## 2019-06-29 LAB — URINE CULTURE: Culture: NO GROWTH

## 2019-06-29 LAB — PHOSPHORUS: Phosphorus: 4 mg/dL (ref 2.5–4.6)

## 2019-06-29 LAB — PREALBUMIN: Prealbumin: 13.5 mg/dL — ABNORMAL LOW (ref 18–38)

## 2019-06-29 LAB — MAGNESIUM: Magnesium: 1.7 mg/dL (ref 1.7–2.4)

## 2019-06-29 LAB — TRIGLYCERIDES: Triglycerides: 115 mg/dL (ref <150)

## 2019-06-29 MED ORDER — MAGNESIUM SULFATE 2 GM/50ML IV SOLN
2.0000 g | Freq: Once | INTRAVENOUS | Status: AC
  Administered 2019-06-29: 2 g via INTRAVENOUS
  Filled 2019-06-29: qty 50

## 2019-06-29 MED ORDER — TRACE MINERALS CU-MN-SE-ZN 300-55-60-3000 MCG/ML IV SOLN
INTRAVENOUS | Status: AC
  Filled 2019-06-29: qty 947.2

## 2019-06-29 MED ORDER — POTASSIUM CHLORIDE CRYS ER 20 MEQ PO TBCR
20.0000 meq | EXTENDED_RELEASE_TABLET | ORAL | Status: DC
  Administered 2019-06-29: 12:00:00 20 meq via ORAL
  Filled 2019-06-29: qty 1

## 2019-06-29 MED ORDER — PANTOPRAZOLE SODIUM 40 MG PO TBEC
40.0000 mg | DELAYED_RELEASE_TABLET | Freq: Two times a day (BID) | ORAL | Status: DC
  Administered 2019-06-29 – 2019-07-09 (×20): 40 mg via ORAL
  Filled 2019-06-29 (×21): qty 1

## 2019-06-29 MED ORDER — SODIUM CHLORIDE 0.9 % IV SOLN
2.0000 g | Freq: Three times a day (TID) | INTRAVENOUS | Status: DC
  Administered 2019-06-29 – 2019-07-09 (×31): 2 g via INTRAVENOUS
  Filled 2019-06-29 (×30): qty 2

## 2019-06-29 MED ORDER — HYDROMORPHONE HCL 1 MG/ML IJ SOLN
1.0000 mg | INTRAMUSCULAR | Status: DC | PRN
  Administered 2019-06-29 – 2019-07-02 (×9): 1 mg via INTRAVENOUS
  Filled 2019-06-29 (×10): qty 1

## 2019-06-29 MED ORDER — HYDROCODONE-ACETAMINOPHEN 5-325 MG PO TABS
1.0000 | ORAL_TABLET | Freq: Four times a day (QID) | ORAL | Status: DC | PRN
  Administered 2019-06-29 (×2): 1 via ORAL
  Filled 2019-06-29 (×2): qty 1

## 2019-06-29 MED ORDER — POTASSIUM CHLORIDE 10 MEQ/100ML IV SOLN
10.0000 meq | INTRAVENOUS | Status: AC
  Administered 2019-06-29 (×2): 10 meq via INTRAVENOUS
  Filled 2019-06-29 (×2): qty 100

## 2019-06-29 MED ORDER — POTASSIUM CHLORIDE 10 MEQ/50ML IV SOLN
10.0000 meq | INTRAVENOUS | Status: AC
  Administered 2019-06-29 (×2): 10 meq via INTRAVENOUS
  Filled 2019-06-29 (×2): qty 50

## 2019-06-29 MED ORDER — BOOST / RESOURCE BREEZE PO LIQD CUSTOM
1.0000 | Freq: Two times a day (BID) | ORAL | Status: DC
  Administered 2019-06-29 – 2019-06-30 (×2): 1 via ORAL

## 2019-06-29 MED ORDER — ALPRAZOLAM 0.5 MG PO TABS
0.5000 mg | ORAL_TABLET | Freq: Two times a day (BID) | ORAL | Status: DC | PRN
  Administered 2019-07-01 – 2019-07-08 (×6): 0.5 mg via ORAL
  Filled 2019-06-29 (×7): qty 1

## 2019-06-29 NOTE — Progress Notes (Signed)
PROGRESS NOTE    KANNAN PROIA  WUJ:[id number] DOB: [birth year] DOA: 06/27/2019 PCP: Patient, No Pcp Per   Brief Narrative:  CLAYBORNE DIVIS is a 54 y.o. male with medical history significant for but not limited history of uncontrolled diabetes mellitus type 2 with a history of DKA, gout, hypertension, morbid obesity, pancreatitis, history of noncompliance who was recently just discharged from the hospital on 06/22/2018 where is found and admitted for diabetic ketoacidosis, AKI and acute pancreatitis with pseudocyst.  During that hospitalization he underwent multiple EGDs with cystogastrostomy tube creation and double-pigtail replacements.  He underwent multiple EGDs as well as necrosectomy treated for his acute pancreatitis with necrosis and infected pseudocyst with staph aureus.  He was placed on vancomycin and Flagyl and transition to doxycycline metronidazole at discharge.  Plan was for him to new antibiotics until repeat CT of the abdomen and pelvis in 2 weeks.  Patient was on TPN during that hospitalization and it was reduced to half the dose.  He now presents again diarrhea, nausea, vomiting and 10 out of 10 abdominal pain which was initially dull and stabbing and now is just throbbing.  Patient states that he woke up with diarrhea around 2 AM and had multiple episodes.  Tried to maintain his fluid balance and started vomiting and started having abdominal pain.  Was unable to keep anything down.  He called the gastroenterology office and he was advised to come to the ED for further evaluation recommendations.  In the ED he had a CT of the abdomen pelvis done and he had some abdominal pain that was radiating to the left.  Repeat CT of the abdomen pelvis was done and showed that there is still some residual persistent collections that were definitely not drained by the catheters adjacent to the uncinate process.  Patient clinically is not septic appearing but is complaining of significant abdominal pain.   Because of his uncontrolled symptoms he will be brought into the hospital for further evaluation and will be consulted for further recommendations.  ED Course: In the ED had basic blood work done as well as a CT of the abdomen and pelvis.  He was given a liter of normal saline and given pain control with hydromorphone and antiemetics with promethazine.  I started the patient on antibiotics with vancomycin, ceftriaxone and Flagyl and ordered blood cultures.  I asked the EDP to call GI and she is going to call the gastroenterologist on call for further evaluation.  **Interim History Gastroenterology was consulted for further evaluation recommendations and they are recommending continued supportive care with TPN and IV fluids as well as continue antibiotics for now.  Patient's pain has been uncontrolled so we will change from his IV fentanyl to IV hydromorphone and stop his oxycodone and start him on hydrocodone.  Gastroenterology initially they recommend that once patient able to start Creon to restart at 72,000 units and 36,000 units with snacks.  Patient had replacement of his PICC line and now can start TPN tonight.  He will get the full dose tonight given his poor p.o. intake.  Patient is very anxious and nervous so he was also started on some Xanax.  Antibiotics have been deescalated to IV cefepime 2 g every 8 hours as well as Flagyl 500 mg every 8 hours.  Assessment & Plan:   Active Problems:   Morbid obesity (HCC)   Abdominal pain, epigastric   Abnormal CT of the abdomen   Protein-calorie malnutrition, severe   Pancreatic  necrosis   Infected pancreatic pseudocyst   Intractable nausea and vomiting   Abdominal pain   Diabetes mellitus type 2 in obese (HCC)  Nausea vomiting diarrhea as well as abdominal pain in the setting of below rule out other etiologies including infectious -Admit to inpatient telemetry -CT of the abdomen pelvis as below -We will make the patient n.p.o. but GI advance  diet to clear; patient still not tolerating clears as well and complains of continued abdominal pain -Status post 2 L of fluid in the ED. Then will place on normal saline at 75 MLS per hour -We will start TPN again given his concern for recurrent pancreatitis with a pseudocyst; last cultures from 211 grew out MSSA fluid from the cyst drainage and B Fragillis -Check blood cultures x2 and a GI pathogen panel; GI is also recommending adding a C. difficile via PCR -I clinically do not suspect C. difficile given the patient is afebrile and has no leukocytosis but GI wants to rule this out; currently did not have any bowel movements when I evaluated him -Resume antibiotics as below and is now on IV vancomycin, IV IV ceftriaxone and IV Flagyl; antibiotics for now de-escalate from IV ceftriaxone and IV vancomycin to just IV cefepime will continue Flagyl -Antiemetics with Zofran and GI recommending scheduling every 8 hours -GI evaluated and they feel that he has no significant changes on CT and recommending continuing with supportive care for now with TPN, IV fluids, IV antibiotics and scheduled Zofran -Unfortunately TPN could not be initiated due to his PICC not being long enough so PICC line was exchanged -Changed pain control with IV fentanyl 25 mcg due to uncontrolled pain and will change to IV hydromorphone 1 mg every 3 hours as well p.o. hydrocodone-acetaminophen every 6 hours as needed  Recent Acute on Chronic Pancreatitis with Necrosis infected pseudocyst with staph aureus -Recently discharged on 06/22/2019 and he had Cystogastrostomy tube, EGDs and necrosectomy -Lipase level was 30 on admission -CT of the abdomen pelvis showed "Two cyst gastrostomy catheters decompressing previous large pancreatic pseudocyst. Residual ill-defined pancreatic low-density in the region of catheters. Two small heterogeneous collections persist that are not definitively drained by catheters, 2.5 x 1.7 cm adjacent to the  pancreatic tail, and 4.8 x 2.8 cm collection adjacent to the uncinate process. Liquid and solid stool throughout the colon, difficult to evaluate for perienteric fluid collections. Fluid in the subhepatic right upper quadrant appears to represent fluid within tortuous colon rather than a discrete pericolonic fluid collection. Portions of the mid splenic vein are not opacified and likely occluded. No visualized thrombus. Hepatic steatosis." -Resume Creon when able -We will reconsult Gastroenterology for further evaluation recommendations and they are recommending continue with supportive care as above with TPN, IV fluids, IV antibiotics and scheduled Zofran -IV antibiotics as above -Continue with normal saline at 75 mils per hour -We will have pharmacy consulted for TPN administration given the patient will be n.p.o. and TPN is to be administered tonight however PICC line will need to be removed due to The PICC Line bing use were tried with vancomycin or the TPN  -Continue to monitor his pain status and it was very uncontrolled today so we have made adjustments  Uncontrolled diabetes mellitus type 2 with hyperglycemia -Last hemoglobin A1c was 13.2 and now is 5.2 -Currently not in DKA as anion gap is normal but he does have a small metabolic acidosis -IV fluids as above -Hold Metformin XR 1000 units p.o. twice daily -Resume  Lantus 11 units daily and will place on sensitive NovoLog sliding scale every 4 for now -We will change Lantus dosing to TPN now -Continue monitor CBGs carefully admission was 416 -Repeat CBGs trending down today on Lantus and sliding scale change Lantus to with the TPN -CBGs have been ranging from now 65-1 35  Essential Hypertension  -Blood pressures were not elevated but will hold his antihypertensives of lisinopril, amlodipine and budesonide -We will resume when he is taking p.o. but blood pressures are doing well without any antihypertensives and last blood pressure was  133/76  Metabolic Acidosis -As above -Patient CO2 was 18, anion gap is 13, and chloride was 100; repeat was showing a CO2 of 23, anion gap of 7, chloride level 108 -Continue IV fluid hydration as above and repeat CMP in a.m.  Hyponatremia/Pseudohyponatremia  -Patient's sodium admission was 131 and likely low in the setting of hypoglycemia is now improved -Patient sodium is now 138 -Improved glycemic control and continue with IV fluid hydration and repeat CMP in a.m.  Severe protein calorie malnutrition  -We will obtain nutritionist consultation -Patient's prealbumin was 13.5 -Resume TPN at full dose today -Nutritionist also recommends boost breeze p.o. twice daily  Hypokalemia -Patient's potassium is 2.8 -Replete with IV KCl 20 mill close this morning and then 40 mEq later  -We will stop p.o. replacement given his poor p.o. intake -Continue monitor and trend and repeat CMP in a.m. -Further electrolyte correction to be done in TPN per pharmacy  Bilateral lower extremity edema -Likely in the setting of malnutrition, anemia and hypoalbuminemia with questionable venous insufficiency  -Hold Bumex and continue to encourage leg elevation -We will need to continue to ambulate  Gout -Resume allopurinol when able and take p.o.  GERD -We will change PPI to IV  Obesity -Estimated body mass index is 38.53 kg/m as calculated from the following:   Height as of this encounter: 5\' 11"  (1.803 m).   Weight as of this encounter: 125.3 kg. -Weight Loss and Dietary Counseling given  Normocytic Anemia -Patient's hemoglobin/hematocrit has now dropped to 7.7/25.5 -Check anemia panel in the a.m. -Likely dilutional drop from IV fluid resuscitation and patient's dehydration -Continue to monitor for signs and symptoms of bleeding; currently no overt bleeding noted -Repeat CBC in a.m.  Anxiety -We will start p.o. Xanax 0.5 mg twice daily as needed  DVT prophylaxis: Enoxaparin 65 mg  subcu every 24 Code Status: FULL CODE Family Communication: No family present at bedside Disposition Plan: Patient is from home now requiring hospitalization due to intractable nausea, vomiting pending further improvement and tolerance of diet and evaluation by GI and  Consultants:   Gastroenterology  Pharmacy with TPN   Procedures: CT Scan   Antimicrobials:  Anti-infectives (From admission, onward)   Start     Dose/Rate Route Frequency Ordered Stop   06/29/19 1200  ceFEPIme (MAXIPIME) 2 g in sodium chloride 0.9 % 100 mL IVPB     2 g 200 mL/hr over 30 Minutes Intravenous Every 8 hours 06/29/19 1103     06/27/19 1830  cefTRIAXone (ROCEPHIN) 2 g in sodium chloride 0.9 % 100 mL IVPB  Status:  Discontinued     2 g 200 mL/hr over 30 Minutes Intravenous Every 24 hours 06/27/19 1823 06/29/19 1103   06/27/19 1830  vancomycin (VANCOREADY) IVPB 1500 mg/300 mL  Status:  Discontinued     1,500 mg 150 mL/hr over 120 Minutes Intravenous Every 12 hours 06/27/19 1823 06/29/19 1103   06/27/19 1745  metroNIDAZOLE (FLAGYL) IVPB 500 mg     500 mg 100 mL/hr over 60 Minutes Intravenous Every 8 hours 06/27/19 1744       Subjective: Seen and examined at bedside and he is very nervous and anxious and had a rough night and did not sleep very well.  Continues to have significant abdominal pain and has not been eating very much.  States that he has not had any diarrhea up until the point I saw him.  No nausea or vomiting but states that his pain is very severe and feels like it was a throbbing pain where somebody is punching him.  No other concerns or complaints at this time  Objective: Vitals:   06/28/19 1653 06/28/19 2014 06/29/19 0421 06/29/19 1300  BP: 115/65 132/74 128/73 133/76  Pulse: 82 81 91 80  Resp: 18 15 15 18   Temp: 98.2 F (36.8 C) 97.6 F (36.4 C) 98.6 F (37 C) 98.7 F (37.1 C)  TempSrc: Oral  Oral Oral  SpO2: 100% 99%  100%  Weight:      Height:        Intake/Output Summary  (Last 24 hours) at 06/29/2019 1757 Last data filed at 06/29/2019 1144 Gross per 24 hour  Intake 2504.33 ml  Output 1000 ml  Net 1504.33 ml   Filed Weights   06/27/19 1306 06/28/19 0457  Weight: 128 kg 125.3 kg   Examination: Physical Exam:  Constitutional: WN/WD obese African-American male currently no acute distress appears uncomfortable and still complains of abdominal pain Eyes: Lids and conjunctivae normal, sclerae anicteric  ENMT: External Ears, Nose appear normal. Grossly normal hearing.  Neck: Appears normal, supple, no cervical masses, normal ROM, no appreciable thyromegaly; no JVD Respiratory: Diminished to auscultation bilaterally, no wheezing, rales, rhonchi or crackles. Normal respiratory effort and patient is not tachypenic.  Unlabored breathing and no accessory muscle use.  Not wearing any supplemental oxygen via nasal cannula Cardiovascular: RRR, no murmurs / rubs / gallops. S1 and S2 auscultated.  Has some 1+ lower extremity edema bilaterally Abdomen: Soft, tender to palpate, distended secondary body habitus.  Bowel sounds positive x4.  GU: Deferred. Musculoskeletal: No clubbing / cyanosis of digits/nails. No joint deformity upper and lower extremities.  Skin: No rashes, lesions, ulcers on limited skin evaluation. No induration; Warm and dry.  Neurologic: CN 2-12 grossly intact with no focal deficits. Romberg sign and cerebellar reflexes not assessed.  Psychiatric: Normal judgment and insight. Alert and oriented x 3. Normal mood and appropriate affect.   Data Reviewed: I have personally reviewed following labs and imaging studies  CBC: Recent Labs  Lab 06/27/19 1316 06/27/19 1546 06/27/19 2326 06/29/19 0311  WBC 9.3  --  5.3 5.2  NEUTROABS 8.3*  --  3.2 3.5  HGB 9.1* 9.9* 7.8* 7.7*  HCT 31.4* 29.0* 25.3* 25.5*  MCV 87.0  --  81.9 84.7  PLT 230  --  225 694   Basic Metabolic Panel: Recent Labs  Lab 06/27/19 1316 06/27/19 1546 06/27/19 2326 06/29/19 0311   NA 131* 134* 135 138  K 3.8 3.9 3.7 2.8*  CL 100  --  103 108  CO2 18*  --  22 23  GLUCOSE 416*  --  260* 86  BUN 10  --  8 6  CREATININE 0.63  --  0.62 0.63  CALCIUM 8.6*  --  8.6* 8.4*  MG 1.7  --  1.7 1.7  PHOS  --   --  3.7 4.0   GFR:  Estimated Creatinine Clearance: 145.6 mL/min (by C-G formula based on SCr of 0.63 mg/dL). Liver Function Tests: Recent Labs  Lab 06/27/19 1316 06/27/19 2326 06/29/19 0311  AST 11* 11* 11*  ALT 9 8 9   ALKPHOS 99 87 78  BILITOT 0.5 0.3 0.5  PROT 7.4 6.8 6.4*  ALBUMIN 2.6* 2.3* 2.1*   Recent Labs  Lab 06/27/19 1316  LIPASE 30   No results for input(s): AMMONIA in the last 168 hours. Coagulation Profile: No results for input(s): INR, PROTIME in the last 168 hours. Cardiac Enzymes: No results for input(s): CKTOTAL, CKMB, CKMBINDEX, TROPONINI in the last 168 hours. BNP (last 3 results) No results for input(s): PROBNP in the last 8760 hours. HbA1C: Recent Labs    06/27/19 2326  HGBA1C 8.7*   CBG: Recent Labs  Lab 06/29/19 0023 06/29/19 0215 06/29/19 0418 06/29/19 0803 06/29/19 1602  GLUCAP 65* 76 94 89 135*   Lipid Profile: Recent Labs    06/27/19 2326 06/29/19 0311  TRIG 86 115   Thyroid Function Tests: Recent Labs    06/27/19 2326  TSH 1.243   Anemia Panel: No results for input(s): VITAMINB12, FOLATE, FERRITIN, TIBC, IRON, RETICCTPCT in the last 72 hours. Sepsis Labs: Recent Labs  Lab 06/27/19 1523  LATICACIDVEN 1.2    Recent Results (from the past 240 hour(s))  SARS CORONAVIRUS 2 (TAT 6-24 HRS) Nasopharyngeal Nasopharyngeal Swab     Status: None   Collection Time: 06/27/19  5:27 PM   Specimen: Nasopharyngeal Swab  Result Value Ref Range Status   SARS Coronavirus 2 NEGATIVE NEGATIVE Final    Comment: (NOTE) SARS-CoV-2 target nucleic acids are NOT DETECTED. The SARS-CoV-2 RNA is generally detectable in upper and lower respiratory specimens during the acute phase of infection. Negative results do not  preclude SARS-CoV-2 infection, do not rule out co-infections with other pathogens, and should not be used as the sole basis for treatment or other patient management decisions. Negative results must be combined with clinical observations, patient history, and epidemiological information. The expected result is Negative. Fact Sheet for Patients: HairSlick.no Fact Sheet for Healthcare Providers: quierodirigir.com This test is not yet approved or cleared by the [place] FDA and  has been authorized for detection and/or diagnosis of SARS-CoV-2 by FDA under an Emergency Use Authorization (EUA). This EUA will remain  in effect (meaning this test can be used) for the duration of the COVID-19 declaration under Section 56 4(b)(1) of the Act, 21 U.S.C. section 360bbb-3(b)(1), unless the authorization is terminated or revoked sooner. Performed at [facility], [address] [place], [place] [address]   Culture, blood (routine x 2)     Status: None (Preliminary result)   Collection Time: 06/27/19 11:35 PM   Specimen: BLOOD  Result Value Ref Range Status   Specimen Description BLOOD RIGHT HAND  Final   Special Requests   Final    BOTTLES DRAWN AEROBIC AND ANAEROBIC Blood Culture adequate volume Performed at [facility], [address] [place], [place] [address]    Culture NO GROWTH 1 DAY  Final   Report Status PENDING  Incomplete  Culture, blood (routine x 2)     Status: None (Preliminary result)   Collection Time: 06/27/19 11:37 PM   Specimen: BLOOD  Result Value Ref Range Status   Specimen Description BLOOD RIGHT FOREARM  Final   Special Requests   Final    BOTTLES DRAWN AEROBIC AND ANAEROBIC Blood Culture adequate volume Performed at [facility], [address], [place], [place] [address]    Culture NO GROWTH 1 DAY  Final   Report Status PENDING  Incomplete  Urine culture     Status: None   Collection  Time: 06/28/19  5:12 AM   Specimen: Urine, Clean Catch  Result Value Ref Range Status   Specimen Description URINE, CLEAN CATCH  Final   Special Requests NONE  Final   Culture   Final    NO GROWTH Performed at [facility], [address] [place], [place] [address]    Report Status 06/29/2019 FINAL  Final  C difficile quick scan w PCR reflex     Status: None   Collection Time: 06/29/19 12:38 PM   Specimen: Stool  Result Value Ref Range Status   C Diff antigen NEGATIVE NEGATIVE Final   C Diff toxin NEGATIVE NEGATIVE Final   C Diff interpretation No C. difficile detected.  Final    Comment: Performed at [facility], [address] [place], [place] [address]     RN Pressure Injury Documentation:     Estimated body mass index is 38.53 kg/m as calculated from the following:   Height as of this encounter: 5\' 11"  (1.803 m).   Weight as of this encounter: 125.3 kg.  Malnutrition Type:  Nutrition Problem: Increased nutrient needs Etiology: acute illness(pancreatitis)   Malnutrition Characteristics:  Signs/Symptoms: estimated needs   Nutrition Interventions:  Interventions: Boost Breeze, TPN  Radiology Studies: EKG SITE RITE  Result Date: 06/28/2019 If Site Rite image not attached, placement could not be confirmed due to current cardiac rhythm.  06/30/2019 EKG SITE RITE  Result Date: 06/28/2019 If Site Rite image not attached, placement could not be confirmed due to current cardiac rhythm.  Scheduled Meds: . allopurinol  300 mg Oral Daily  . Chlorhexidine Gluconate Cloth  6 each Topical Daily  . enoxaparin (LOVENOX) injection  0.5 mg/kg Subcutaneous Q24H  . feeding supplement  1 Container Oral BID BM  . insulin aspart  0-15 Units Subcutaneous Q4H  . lipase/protease/amylase  36,000 Units Oral TID AC  . pantoprazole  40 mg Oral BID  . sodium chloride flush  10-40 mL Intracatheter Q12H  . sodium chloride flush  10-40 mL Intracatheter Q12H   Continuous  Infusions: . sodium chloride 75 mL/hr at 06/28/19 1021  . ceFEPime (MAXIPIME) IV 2 g (06/29/19 1454)  . metronidazole 500 mg (06/29/19 1734)  . TPN CYCLIC-ADULT (ION)      LOS: 2 days   07/01/19, DO [facility] PAGER is on AMION  If 7PM-7AM, please contact night-coverage [email or web address]

## 2019-06-29 NOTE — Progress Notes (Signed)
Pharmacy Antibiotic Note  Caleb Chambers is a 54 y.o. male admitted on 06/27/2019 with Intra- abd infection.   Pt was recently here for a complicated case of necrotizing pancreatitis. He was treated with vanc/flagyl before transition to doxy/flagyl for discharged. His culture grew MSSA and bacteriodes fragilis. D/w Dr Marland Mcalpine today and we will optimize his abx to cefepime and flagyl since cefepime is good for MSSA.  Height: 5\' 11"  (180.3 cm) Weight: 276 lb 3.8 oz (125.3 kg) IBW/kg (Calculated) : 75.3  Temp (24hrs), Avg:98.1 F (36.7 C), Min:97.6 F (36.4 C), Max:98.6 F (37 C)  Recent Labs  Lab 06/27/19 1316 06/27/19 1523 06/27/19 2326 06/29/19 0311  WBC 9.3  --  5.3 5.2  CREATININE 0.63  --  0.62 0.63  LATICACIDVEN  --  1.2  --   --     Estimated Creatinine Clearance: 145.6 mL/min (by C-G formula based on SCr of 0.63 mg/dL).    Allergies  Allergen Reactions  . Penicillins Anaphylaxis, Hives, Swelling and Rash    Spoke with patient and he reported in his 88s he had a PCN (unsure which specific drug) and developed a rash with SOB. Reported no PCNs since.  Did it involve swelling of the face/tongue/throat, SOB, or low BP? Unknown Did it involve sudden or severe rash/hives, skin peeling, or any reaction on the inside of your mouth or nose? Unknown Did you need to seek medical attention at a hospital or doctor's office? Unknown When did it last happen?mid 20's (age) If all above answers are "NO", may proc  . Metoprolol Other (See Comments)    Chest pains - reported by [facility] and  [facility] 02/08/14  . Clindamycin Rash and Other (See Comments)     reported by [facility] and [facility] 09-30-13    Antimicrobials this admission: 3/13 Ceftriaxone >> 3/15 3/13 Vancomycin >> 3/15 3/15 cefetpime>> 3/13 flagyl>>  Dose adjustments this admission: N/a  Microbiology results: 2/11: MSSA fluid from cyst drainage, B fragillis   Plan:  Dc vanc/ceftriaxone Cefepime 2g IV  q8 Continue flagyl 500mg  IV q8  4/11, PharmD, BCIDP, AAHIVP, CPP Infectious Disease Pharmacist 06/29/2019 11:05 AM

## 2019-06-29 NOTE — Plan of Care (Signed)

## 2019-06-29 NOTE — Progress Notes (Signed)
Initial Nutrition Assessment  RD working remotely.  DOCUMENTATION CODES:   Obesity unspecified  INTERVENTION:   -TPN management per pharmacy -Boost Breeze po BID, each supplement provides 250 kcal and 9 grams of protein -RD will follow for diet advancement and adjust supplement regimen as appropriate  NUTRITION DIAGNOSIS:   Increased nutrient needs related to acute illness(pancreatitis) as evidenced by estimated needs.  GOAL:   Patient will meet greater than or equal to 90% of their needs  MONITOR:   PO intake, Supplement acceptance, Diet advancement, Labs, Weight trends, Skin, I & O's  REASON FOR ASSESSMENT:   Consult New TPN/TNA  ASSESSMENT:   Caleb Chambers is a 54 y.o. male with medical history significant for but not limited history of uncontrolled diabetes mellitus type 2 with a history of DKA, gout, hypertension, morbid obesity, pancreatitis, history of noncompliance who was recently just discharged from the hospital on 06/22/2018 where is found and admitted for diabetic ketoacidosis, AKI and acute pancreatitis with pseudocyst.  During that hospitalization he underwent multiple EGDs with cystogastrostomy tube creation and double-pigtail replacements.  He underwent multiple EGDs as well as necrosectomy treated for his acute pancreatitis with necrosis and infected pseudocyst with staph aureus.  He was placed on vancomycin and Flagyl and transition to doxycycline metronidazole at discharge.  Plan was for him to new antibiotics until repeat CT of the abdomen and pelvis in 2 weeks.  Patient was on TPN during that hospitalization and it was reduced to half the dose.  He now presents again diarrhea, nausea, vomiting and 10 out of 10 abdominal pain which was initially dull and stabbing and now is just throbbing.  Patient states that he woke up with diarrhea around 2 AM and had multiple episodes.  Tried to maintain his fluid balance and started vomiting and started having abdominal  pain.  Was unable to keep anything down.  He called the gastroenterology office and he was advised to come to the ED for further evaluation recommendations.  In the ED he had a CT of the abdomen pelvis done and he had some abdominal pain that was radiating to the left.  Repeat CT of the abdomen pelvis was done and showed that there is still some residual persistent collections that were definitely not drained by the catheters adjacent to the uncinate process.  Patient clinically is not septic appearing but is complaining of significant abdominal pain.  Because of his uncontrolled symptoms he will be brought into the hospital for further evaluation and will be consulted for further recommendations.  Pt admitted with nausea, vomiting, and diarrhea with recent admission for acute on chronic pancreatitis with necrosis, infected pseudocyst with staph aureus.   Reviewed I/O's: +1.2L x 24 hours and +2.4 L since admission  UOP: 1.3 L x 24 hours  Attempted to speak with pt via phone, however, no answer.   Pt familiar to RD due to recent prior admission for pancreatitis. Pt with poor oral intake during prior admission (which improved), but sent home on TPN due to continued decreased oral intake. Per discussion with pharmacist, intake remains poor during hospitalization. Pt with generalized anxiety about eating, stating "it's all in my head". Pt unable to keep foods of liquids down for at least 2 days PTA secondary to nausea, vomiting, and diarrhea.   Reviewed wt hx; pt has experienced a 13.7% wt loss over the past 2 months, which is ongoing. Highly suspect malnutrition, which was identified during last hospitalization, is ongoing.   Pt currently  receiving half dose TPN (Cycle 108mL/hr over 12 hrs:  Infuse 45 mL/hr x 1 hr, then 91 mL/hr x 10 hrs, then 45 mL/hr x 1 hr. This TPN provides 71g AA, 155g CHO, 36g ILE for total 1171 kcal, meeting 50% of patient needs), which provides 1171 kcals and 71 grams protein,  meeting 50% of estimated kcal needs and 51% of estimated protein needs  Spoke with pharmacist Caleb Chambers) via secure chat. Both RD and pharmacist agree to increase TPN secondary to poor oral intake and on clear liquid diet (which provides minimal nutritional value eve if pt consumed 100% of clear liquids). At 1800 will increase to concentrated cyclic TPN (Cycle 0076 mL/hr over 12 hrs:  Infuse 87 mL/hr x 1 hr, then 175 mL/hr x 10 hrs, then 87 mL/hr x 1 hr). Regimen provides 2398 kcals and 142 grams protein, meeting 100% of estimated kcal and protein needs.  Lab Results  Component Value Date   HGBA1C 8.7 (H) 06/27/2019   PTA DM medications are 3 units insulin aspart TID, 1000 mg metformin daily.   Labs reviewed: K: 2.8 (on B supplementation), CBGS: 89-135 (inpatient orders for glycemic control are 0-15 units insulin aspart every 4 hours).   Diet Order:   Diet Order            Diet clear liquid Room service appropriate? Yes; Fluid consistency: Thin  Diet effective now              EDUCATION NEEDS:   No education needs have been identified at this time  Skin:  Skin Assessment: Reviewed RN Assessment  Last BM:  06/29/19  Height:   Ht Readings from Last 1 Encounters:  06/27/19 5\' 11"  (1.803 m)    Weight:   Wt Readings from Last 1 Encounters:  06/28/19 125.3 kg    Ideal Body Weight:  78.2 kg  BMI:  Body mass index is 38.53 kg/m.  Estimated Nutritional Needs:   Kcal:  2263-3354  Protein:  140-155 grams  Fluid:  > 2.3 L    Loistine Chance, RD, LDN, [place] Registered Dietitian II Certified Diabetes Care and Education Specialist Please refer to [facility] for RD and/or RD on-call/weekend/after hours pager

## 2019-06-29 NOTE — Progress Notes (Addendum)
PHARMACY - TOTAL PARENTERAL NUTRITION CONSULT NOTE   Indication: infected pancreatic pseudocyst  Patient Measurements: Height: 5\' 11"  (180.3 cm) Weight: 276 lb 3.8 oz (125.3 kg) IBW/kg (Calculated) : 75.3 TPN AdjBW (KG): 88.5 Body mass index is 38.53 kg/m.  Assessment: 8 yom on chronic TPN presenting 06/27/19 with diarrhea, 10/10 abdominal pain, N/V. PMH significant for uncontrolled DM2, gout, HTN, pancreatitis, history of noncompliance. Patient recently discharged 06/22/19 after admission for DKA, AKI, and acute pancreatitis with infected pseudocyst, s/p multiple EGD, necrosectomy procedures with cyst gastrostomy. Patient was discharged on doxy and metronidazole. TPN was reduced during previous hospitalization to 1/2 dose on discharge to supplement PO diet. Antibiotics have been broadened and GI consulted. Patient reports no missed days of TPN or toleration issues PTA, last 3/13.  Glucose / Insulin: hx DM, A1c improved to 8.7% (previously 13.2%). CBGs elevated 300-400s initially,now low at 76-94 with 30 units of insulin in TPN + SSI (utilized 5 units since 3/13 PM). Suspect lows due to poor po intake and TPN at 1/2 dose.  Electrolytes: K low 2.8-will replace, Mg 1.7-will replace, others within normal limits.  Renal: SCr and BUN WNL LFTs / TGs: LFTs / Tbili / TG within normal limits.  Prealbumin / albumin: Pre-albumin 14.1>>13.5, albumin 2.1 Intake / Output; MIVF: LBM 3/12; NS at 75 ml/hr. Zofran prn for nausea/vomiting. Currently having diarrhea - quantity not yet recorded.  GI Imaging: 3/13 CT abd/pelvis: residual persistent collections Surgeries / Procedures: none this admit  Central access: PICC placed 06/05/19 TPN start date: chronic TPN PTA; 3/14 inpatient  Current TPN Rx [facility]): -15% Amino Acid 71 g/day, Dextrose 154 g/day, Lipids 36 g/day - provides 1168 kcal/day -Electrolytes:  Ca gluconate 5 meq, Mag sulfate 19.2 meq, Potassium Acetate 0 meq, Potassium Cl 5 meq, K Phosphate 10 mmol,  Sodium Acetate 0 meq, Sodium Cl 77 meq, Sodium Phosphate 0 mmol -Additives:  Trace elements 1 mL, MVI 10 mL, Regular insulin 45 units -Administer over 12 hrs with 1 hr ramp up and 1 hr ramp down  Nutritional Goals (per last RD recommendation on 3/4): kCal: 2300-2500, Protein: 130-145 g, Fluid: >2L/day  Current Nutrition:  Clear liquids - Patient reports he is taking in a little bit. He states "he is hungry but when he goes to eat he will take 1 or 2 bites and then doesn't want anymore." He states "he thinks it may be all in his head" but he is concerned about his lack of desire to eat a full meal.   Plan:  Will increase to full TPN today with low intake: Increase concentrated cyclic TPN to full dose at 1800. Cycle 1920 mL/hr over 12 hrs:  Infuse 87 mL/hr x 1 hr, then 175 mL/hr x 10 hrs, then 87 mL/hr x 1 hr. This TPN provides 142g AA, 326g CHO, 72g ILE for total  kcal, meeting 100% of patient needs Electrolytes in TPN: 30mEq/L of Na, 2mEq/L of K, 2.33mEq/L of Ca, 15mEq/L of Mg, and 28mmol/L of Phos. Cl:Ac ratio 1:1 Add MVI MWF only due to national shortage and trace elements daily to TPN Add folic acid to TPN and discontinue oral.  D/c Lantus, as is safer to give all insulin in TPN bag.  Increase regular insulin 50 units to TPN bag due to increase dextrose amount with full TPN. Continue Moderate q4h and adjust as needed NS per MD - patient with on going diarrhea Monitor TPN labs on Mon/Thurs, GI plans, improvement of N/V and ability to tolerate PO  Give Magnesium 2g IV x1 today Give KCl IV x6 runs today: discussed with Dr. Marland Mcalpine. Concern that patient is not absorbing well po with continued abdominal pain and diarrhea.    Link Snuffer, PharmD, BCPS, BCCCP Clinical Pharmacist Clinical phone 06/29/2019 until 3PM [phone] Please refer to [facility] for [facility] numbers 06/29/2019 7:09 AM

## 2019-06-29 NOTE — Progress Notes (Addendum)
Subjective: [name] is a 54 year old black male with a history of poorly controlled diabetes mellitus with DKA, HTN, gout and medical non-compliance complicated pancreatitis resulting in a pseudocyst, requiring cyst gastrostomy with pigtail stent placement and necrosectomy that has been done on multiple occasions in the last few weeks by Dr. Justice Britain. He was discharged from the hospital on 06/22/2018 after he was admitted for diabetic ketoacidosis.  He was treated with vancomycin and Flagyl and can transition to doxycycline metronidazole. He claims he started developing abdominal pain with nausea vomiting and diarrhea after discharge and after a couple of days could not keep anything down prompting him to come back to the emergency room. He says his symptoms are similar to the symptoms he had when he was first diagnosed with pancreatitis but the pain was not as severe. He reportedly called Dr. Denzil Magnuson, over the weekend, for a refill on his pain medication but she suggested that he come to the emergency room if his pain was getting worse. The CT scan done in the emergency room admission showed some residual collection in the pancreas but no evidence of sepsis but the patient was hospitalized for further evaluation of his abdominal pain nausea vomiting and diarrhea. He claims his symptoms are somewhat better today but is requiring around-the-clock pain medications for the abdominal pain. He has had 3 loose bowel movements today but there is no blood or mucus in the stool.  Lipase was 30 on admission.  Objective: Vital signs in last 24 hours: Temp:  [97.6 F (36.4 C)-98.7 F (37.1 C)] 98.7 F (37.1 C) (03/15 1300) Pulse Rate:  [80-91] 80 (03/15 1300) Resp:  [15-18] 18 (03/15 1300) BP: (115-133)/(65-76) 133/76 (03/15 1300) SpO2:  [99 %-100 %] 100 % (03/15 1300) Last BM Date: 06/29/19  Intake/Output from previous day: 03/14 0701 - 03/15 0700 In: 2494.3 [I.V.:1494.3; IV  Piggyback:1000] Out: 1250 [Urine:1250] Intake/Output this shift: Total I/O In: 10 [I.V.:10] Out: -   General appearance: alert, cooperative, fatigued, no distress and morbidly obese Resp: clear to auscultation bilaterally Cardio: regular rate and rhythm, S1, S2 normal, no murmur, click, rub or gallop GI: soft, non-tender; morbidly obese, bowel sounds normal; no masses,  no organomegaly Extremities: extremities normal, atraumatic, no cyanosis or edema  Lab Results: Recent Labs    06/27/19 1316 06/27/19 1316 06/27/19 1546 06/27/19 2326 06/29/19 0311  WBC 9.3  --   --  5.3 5.2  HGB 9.1*   < > 9.9* 7.8* 7.7*  HCT 31.4*   < > 29.0* 25.3* 25.5*  PLT 230  --   --  225 218   < > = values in this interval not displayed.   BMET Recent Labs    06/27/19 1316 06/27/19 1316 06/27/19 1546 06/27/19 2326 06/29/19 0311  NA 131*   < > 134* 135 138  K 3.8   < > 3.9 3.7 2.8*  CL 100  --   --  103 108  CO2 18*  --   --  22 23  GLUCOSE 416*  --   --  260* 86  BUN 10  --   --  8 6  CREATININE 0.63  --   --  0.62 0.63  CALCIUM 8.6*  --   --  8.6* 8.4*   < > = values in this interval not displayed.   LFT Recent Labs    06/29/19 0311  PROT 6.4*  ALBUMIN 2.1*  AST 11*  ALT 9  ALKPHOS 78  BILITOT 0.5   Studies/Results: CT ABDOMEN PELVIS W CONTRAST  Result Date: 06/27/2019 CLINICAL DATA:  Worsening abdominal pain and nausea. Recent admission for pancreatitis with pseudocyst. EXAM: CT ABDOMEN AND PELVIS WITH CONTRAST TECHNIQUE: Multidetector CT imaging of the abdomen and pelvis was performed using the standard protocol following bolus administration of intravenous contrast. CONTRAST:  100 cc Omnipaque 300 IV COMPARISON:  Most recent CT 05/25/2019 FINDINGS: Lower chest: Hypoventilatory atelectasis. No pleural fluid. Prominent heart size. Hepatobiliary: Diffusely decreased hepatic density consistent with steatosis. No evidence of focal liver lesion or intrahepatic fluid collection. Clips in  the gallbladder fossa postcholecystectomy. No biliary dilatation. Pancreas: 2 cyst gastrostomy tubes in place decompressing previous large peripancreatic collection. Majority of the peripancreatic collection has resolved with residual ill-defined soft tissue density about the body and tail. Small pocket centrally with mottled air measures 2.5 x 1.7 cm, series 3, image 38. Residual collection in the uncinate process with heterogeneous air in fluid measures 4.7 x 2.8 cm, series 3, image 53. Mild generalized peripancreatic fat stranding. Pancreatic duct is not well-defined. Spleen: Mildly enlarged spanning 14.5 cm. Mid splenic vein is not opacified and presumably occluded. No focal intrasplenic abnormality. Adrenals/Urinary Tract: Normal adrenal glands. Slight left renal heterogeneity is likely reactive. No hydronephrosis. No perinephric edema. Urinary bladder is physiologically distended. No bladder wall thickening. Stomach/Bowel: Bowel evaluation is limited in the absence of enteric contrast. 2 cyst gastrostomy tubes in the stomach. Gastric wall is poorly defined. There is fluid within the duodenum. No small bowel dilatation or obstruction. Mild edema of small bowel loops in the left abdomen, likely reactive. Liquid and solid stool throughout the colon. Liquid colonic stool makes detailed assessment for perienteric fluid collections difficult. Fluid in the subhepatic right upper quadrant appears to represent fluid within tortuous colon rather than a discrete pericolonic fluid collection. Fat lobule abutting the sigmoid colon was seen on prior exam likely sequela of prior epiploic appendagitis. The appendix is normal. Vascular/Lymphatic: Portions of the mid splenic vein are not opacified and likely occluded. No visualized thrombus. The portal vein is patent. Multiple prominent peripancreatic and retroperitoneal nodes, likely reactive. Aorta bi-iliac atherosclerosis. Reproductive: Prostate is unremarkable. Other: Large  pancreatic pseudocyst has improved post cyst-gastrostomy. Small residual collections as described above. Small volume of perihepatic ascites. Small amount of free fluid tracks in the pericolic gutters. Central mesenteric edema. Musculoskeletal: Degenerative change in the spine. There are no acute or suspicious osseous abnormalities. IMPRESSION: 1. Two cyst gastrostomy catheters decompressing previous large pancreatic pseudocyst. Residual ill-defined pancreatic low-density in the region of catheters. Two small heterogeneous collections persist that are not definitively drained by catheters, 2.5 x 1.7 cm adjacent to the pancreatic tail, and 4.8 x 2.8 cm collection adjacent to the uncinate process. 2. Liquid and solid stool throughout the colon, difficult to evaluate for perienteric fluid collections. Fluid in the subhepatic right upper quadrant appears to represent fluid within tortuous colon rather than a discrete pericolonic fluid collection. 3. Portions of the mid splenic vein are not opacified and likely occluded. No visualized thrombus. 4. Hepatic steatosis. Aortic Atherosclerosis (ICD10-I70.0). Electronically Signed   By: Narda Rutherford M.D.   On: 06/27/2019 16:24   [place] EKG SITE RITE  Result Date: 06/28/2019 If Site Rite image not attached, placement could not be confirmed due to current cardiac rhythm.  [place] EKG SITE RITE  Result Date: 06/28/2019 If Site Rite image not attached, placement could not be confirmed due to current cardiac rhythm.  Medications: I have reviewed the patient's  current medications.  Assessment/Plan: 1) Recurrent abdominal pain nausea and vomiting in a 54 year old black male with infected pseudocyst that has been drained on multiple occasions has a cyst gastrostomy and pigtail catheters-reasons for the patient's symptoms are somewhat not clear to me I think we need to monitor his symptoms in the hospital before we can make further decisions. CT scan done on admission reveals  some residual ill-defined pancreatic low-density in the region of the catheters with 2 small heterogeneous collections that are not drained by the catheter is measuring 2.5 x 1.7 cm adjacent to the pancreatic tail and 4.8 x 2.8 cm adjacent to the uncinate process.  We will discussed this with Dr. Irish Lack already as well tomorrow. 2) HTN. 3) Poorly controlled diabetes mellitus/morbid obesity/hepatic steatosis. 4) Severe protein calorie malnutrition. 5) GERD. 6) Gout.   LOS: 2 days   Charna Elizabeth 06/29/2019, 3:41 PM

## 2019-06-30 ENCOUNTER — Encounter (HOSPITAL_COMMUNITY): Payer: Self-pay | Admitting: Internal Medicine

## 2019-06-30 DIAGNOSIS — E440 Moderate protein-calorie malnutrition: Secondary | ICD-10-CM | POA: Insufficient documentation

## 2019-06-30 LAB — COMPREHENSIVE METABOLIC PANEL
ALT: 8 U/L (ref 0–44)
AST: 11 U/L — ABNORMAL LOW (ref 15–41)
Albumin: 1.9 g/dL — ABNORMAL LOW (ref 3.5–5.0)
Alkaline Phosphatase: 70 U/L (ref 38–126)
Anion gap: 7 (ref 5–15)
BUN: 12 mg/dL (ref 6–20)
CO2: 23 mmol/L (ref 22–32)
Calcium: 8 mg/dL — ABNORMAL LOW (ref 8.9–10.3)
Chloride: 108 mmol/L (ref 98–111)
Creatinine, Ser: 0.59 mg/dL — ABNORMAL LOW (ref 0.61–1.24)
GFR calc Af Amer: >60 mL/min (ref >60)
GFR calc non Af Amer: >60 mL/min (ref >60)
Glucose, Bld: 161 mg/dL — ABNORMAL HIGH (ref 70–99)
Potassium: 3.5 mmol/L (ref 3.5–5.1)
Sodium: 138 mmol/L (ref 135–145)
Total Bilirubin: 0.4 mg/dL (ref 0.3–1.2)
Total Protein: 6 g/dL — ABNORMAL LOW (ref 6.5–8.1)

## 2019-06-30 LAB — GI PATHOGEN PANEL BY PCR, STOOL

## 2019-06-30 LAB — CBC WITH DIFFERENTIAL/PLATELET
Abs Immature Granulocytes: 0.01 10*3/uL (ref 0.00–0.07)
Basophils Absolute: 0 10*3/uL (ref 0.0–0.1)
Basophils Relative: 0 %
Eosinophils Absolute: 0.1 10*3/uL (ref 0.0–0.5)
Eosinophils Relative: 2 %
HCT: 25.1 % — ABNORMAL LOW (ref 39.0–52.0)
Hemoglobin: 7.7 g/dL — ABNORMAL LOW (ref 13.0–17.0)
Immature Granulocytes: 0 %
Lymphocytes Relative: 31 %
Lymphs Abs: 1.5 10*3/uL (ref 0.7–4.0)
MCH: 25.8 pg — ABNORMAL LOW (ref 26.0–34.0)
MCHC: 30.7 g/dL (ref 30.0–36.0)
MCV: 84.2 fL (ref 80.0–100.0)
Monocytes Absolute: 0.3 10*3/uL (ref 0.1–1.0)
Monocytes Relative: 6 %
Neutro Abs: 2.9 10*3/uL (ref 1.7–7.7)
Neutrophils Relative %: 61 %
Platelets: 211 10*3/uL (ref 150–400)
RBC: 2.98 MIL/uL — ABNORMAL LOW (ref 4.22–5.81)
RDW: 17.6 % — ABNORMAL HIGH (ref 11.5–15.5)
WBC: 4.9 10*3/uL (ref 4.0–10.5)
nRBC: 0 % (ref 0.0–0.2)

## 2019-06-30 LAB — GLUCOSE, CAPILLARY
Glucose-Capillary: 139 mg/dL — ABNORMAL HIGH (ref 70–99)
Glucose-Capillary: 148 mg/dL — ABNORMAL HIGH (ref 70–99)
Glucose-Capillary: 152 mg/dL — ABNORMAL HIGH (ref 70–99)
Glucose-Capillary: 164 mg/dL — ABNORMAL HIGH (ref 70–99)
Glucose-Capillary: 176 mg/dL — ABNORMAL HIGH (ref 70–99)
Glucose-Capillary: 248 mg/dL — ABNORMAL HIGH (ref 70–99)

## 2019-06-30 LAB — PHOSPHORUS: Phosphorus: 3.9 mg/dL (ref 2.5–4.6)

## 2019-06-30 LAB — MAGNESIUM: Magnesium: 2 mg/dL (ref 1.7–2.4)

## 2019-06-30 MED ORDER — HYDROCODONE-ACETAMINOPHEN 5-325 MG PO TABS
1.0000 | ORAL_TABLET | ORAL | Status: DC | PRN
  Administered 2019-06-30: 1 via ORAL
  Filled 2019-06-30 (×3): qty 1

## 2019-06-30 MED ORDER — KETOROLAC TROMETHAMINE 30 MG/ML IJ SOLN
30.0000 mg | Freq: Once | INTRAMUSCULAR | Status: AC
  Administered 2019-06-30: 30 mg via INTRAVENOUS
  Filled 2019-06-30: qty 1

## 2019-06-30 MED ORDER — DICYCLOMINE HCL 10 MG/5ML PO SOLN: 20.0000 mg | Freq: Three times a day (TID) | ORAL | Status: DC

## 2019-06-30 MED ORDER — POTASSIUM CHLORIDE CRYS ER 20 MEQ PO TBCR
20.0000 meq | EXTENDED_RELEASE_TABLET | Freq: Once | ORAL | Status: AC
  Administered 2019-06-30: 10:00:00 20 meq via ORAL
  Filled 2019-06-30: qty 1

## 2019-06-30 MED ORDER — TRACE MINERALS CU-MN-SE-ZN 300-55-60-3000 MCG/ML IV SOLN
INTRAVENOUS | Status: AC
  Filled 2019-06-30: qty 949.17

## 2019-06-30 MED ORDER — DICYCLOMINE HCL 10 MG PO CAPS
20.0000 mg | ORAL_CAPSULE | Freq: Three times a day (TID) | ORAL | Status: DC
  Administered 2019-06-30 – 2019-07-09 (×36): 20 mg via ORAL
  Filled 2019-06-30 (×40): qty 2

## 2019-06-30 MED ORDER — SIMETHICONE 80 MG PO CHEW: 80.0000 mg | CHEWABLE_TABLET | Freq: Four times a day (QID) | ORAL | Status: DC | PRN

## 2019-06-30 NOTE — Progress Notes (Signed)
Nutrition Follow-up  DOCUMENTATION CODES:   Non-severe (moderate) malnutrition in context of chronic illness  INTERVENTION:   -Continue Boost Breeze po BID, each supplement provides 250 kcal and 9 grams of protein -TPN management per pharmacy -RD will follow for diet advancement and adjust supplement regimen as appropriate  NUTRITION DIAGNOSIS:   Moderate Malnutrition related to chronic illness(pancreatitis) as evidenced by percent weight loss, energy intake < or equal to 75% for > or equal to 1 month, mild fat depletion, moderate fat depletion, mild muscle depletion, moderate muscle depletion.  Ongoing  GOAL:   Patient will meet greater than or equal to 90% of their needs  Met with TPN   MONITOR:   PO intake, Supplement acceptance, Diet advancement, Labs, Weight trends, Skin, I & O's  REASON FOR ASSESSMENT:   Consult New TPN/TNA  ASSESSMENT:   Caleb Chambers is a 54 y.o. male with medical history significant for but not limited history of uncontrolled diabetes mellitus type 2 with a history of DKA, gout, hypertension, morbid obesity, pancreatitis, history of noncompliance who was recently just discharged from the hospital on 06/22/2018 where is found and admitted for diabetic ketoacidosis, AKI and acute pancreatitis with pseudocyst.  During that hospitalization he underwent multiple EGDs with cystogastrostomy tube creation and double-pigtail replacements.  He underwent multiple EGDs as well as necrosectomy treated for his acute pancreatitis with necrosis and infected pseudocyst with staph aureus.  He was placed on vancomycin and Flagyl and transition to doxycycline metronidazole at discharge.  Plan was for him to new antibiotics until repeat CT of the abdomen and pelvis in 2 weeks.  Patient was on TPN during that hospitalization and it was reduced to half the dose.  He now presents again diarrhea, nausea, vomiting and 10 out of 10 abdominal pain which was initially dull and  stabbing and now is just throbbing.  Patient states that he woke up with diarrhea around 2 AM and had multiple episodes.  Tried to maintain his fluid balance and started vomiting and started having abdominal pain.  Was unable to keep anything down.  He called the gastroenterology office and he was advised to come to the ED for further evaluation recommendations.  In the ED he had a CT of the abdomen pelvis done and he had some abdominal pain that was radiating to the left.  Repeat CT of the abdomen pelvis was done and showed that there is still some residual persistent collections that were definitely not drained by the catheters adjacent to the uncinate process.  Patient clinically is not septic appearing but is complaining of significant abdominal pain.  Because of his uncontrolled symptoms he will be brought into the hospital for further evaluation and will be consulted for further recommendations.  Reviewed I/O's: -130 ml x 24 hours and +2.3 L since admission  UOP: 750 ml x 24 hours  Spoke with pt at bedside, who was pleasant and in good spirits at time of visit. Pt reports that he is frustrated that he was readmitted to the hospital, however, "I want to do it right this time and not get rushed out". Pt confirms that he was receiving half dose TPN at home PTA and tolerating well. However, oral intake remains poor. Pt shares that he has generalized anxiety around eating and often eats very little due to early satiety. Per his report, he consumed only a bowl of cereal in the AM and a few bites of chicken in the evening.   Pt shares that  he is tolerating clear liquids, but thinks he will eat more if he receives something more substantial, as the clear liquids are not appealing to him. Noted pt consumed about 25% of Boost Breeze supplement. When asked what type of food he was looking for, he replied "I'm definitely not craving any sloppy joes, but maybe some chicken noodle soup". RD explained that request  for diet advancement must go through MD. RD discussed case with MD via secure chat, who would like to hold diet advancement until after GI sees pt today.   Pt remains dependent on TPN; concentrated cyclic TPN ([id number] over 12 hrs: Infuse 103m/hr x 1 hr, then 1783mhr x 10 hrs, then 875mr x 1 hr). Regimen provides 2398 kcals and 142 grams protein, meeting 100% of estimated kcal and protein needs. Per pharmacy note, plan to continue concentrated cyclic TPN at [id number] 1924 mls over 12 hrs: 87 mL/hr x 1 hr, then 175 mL/hr x 10 hrs, then 87 mL/hr x 1 hr) Regimen will provides 2402 and 142 grams protein, meeting 100% of estimated energy and protein needs.   Reviewed wt hx; pt has experienced a 13.7% wt loss over the past 2 months, which is significant for time frame. Pt confirms he has lost weight, but unsure of UBW of how much weight he has lost. He reports his sister (who has been living him since last hospital discharge) has reported that he appears much more weak.   Labs reviewed: CBGS: 145-173 (inpatient orders for glycemic control are 0-15 units insulin aspart every 4 hours).     Most Recent Value  Orbital Region  Moderate depletion  Upper Arm Region  Mild depletion  Thoracic and Lumbar Region  No depletion  Buccal Region  Mild depletion  Temple Region  Mild depletion  Clavicle Bone Region  No depletion  Clavicle and Acromion Bone Region  No depletion  Scapular Bone Region  No depletion  Dorsal Hand  Mild depletion  Patellar Region  Mild depletion  Anterior Thigh Region  Mild depletion  Posterior Calf Region  Mild depletion  Edema (RD Assessment)  None  Hair  Reviewed  Eyes  Reviewed  Mouth  Reviewed  Skin  Reviewed  Nails  Reviewed      Diet Order:   Diet Order            Diet clear liquid Room service appropriate? Yes; Fluid consistency: Thin  Diet effective now              EDUCATION NEEDS:   Education needs have been addressed  Skin:  Skin  Assessment: Reviewed RN Assessment  Last BM:  06/29/19  Height:   Ht Readings from Last 1 Encounters:  06/27/19 '5\' 11"'  (1.803 m)    Weight:   Wt Readings from Last 1 Encounters:  06/28/19 125.3 kg    Ideal Body Weight:  78.2 kg  BMI:  Body mass index is 38.53 kg/m.  Estimated Nutritional Needs:   Kcal:  [id number]:  140-155 grams  Fluid:  > 2.3 L    JenLoistine ChanceD, LDN, CDCHickmangistered Dietitian II Certified Diabetes Care and Education Specialist Please refer to [facility] and/or RD on-call/weekend/after hours pager

## 2019-06-30 NOTE — Progress Notes (Signed)
PHARMACY - TOTAL PARENTERAL NUTRITION CONSULT NOTE  Indication:  Infected pancreatic pseudocyst  Patient Measurements: Height: 5\' 11"  (180.3 cm) Weight: 276 lb 3.8 oz (125.3 kg) IBW/kg (Calculated) : 75.3 TPN AdjBW (KG): 88.5 Body mass index is 38.53 kg/m.  Assessment:  53 YOM on chronic TPN presenting 06/27/19 with N/V/D and 10/10 abdominal pain. PMH significant for uncontrolled DM2, gout, HTN, pancreatitis and history of noncompliance. Patient recently discharged 06/22/19 after admission for DKA, AKI, and acute pancreatitis with infected pseudocyst, s/p multiple EGD, necrosectomy procedures with cyst gastrostomy. Patient was discharged on doxy and metronidazole. TPN was reduced during previous hospitalization to 1/2 dose on discharge to supplement PO diet. Antibiotics have been broadened and GI consulted. Patient reports no missed days of TPN or toleration issues PTA, last 3/13.  Glucose / Insulin: A1c 13.2 > 8.7%. CBGs 300-400s initially, then low d/t poor PO intake, now within goal.  Required 50 units of insulin in TPN + 8 units SSI. Electrolytes: K 3.5 post 4/13 outside of TPN, others WNL Renal: SCr and BUN WNL LFTs / TGs: LFTs / tbili / TG WNL Prealbumin / albumin: pre-albumin 13.5, albumin 2.1 Intake / Output; MIVF: UOP 0.2 ml/kg/hr, LBM 3/12, NS at 75 ml/hr.  Diarrhea improving per patient, 2 episodes yesterday GI Imaging: 3/13 CT abd/pelvis: residual persistent collections Surgeries / Procedures: none this admit  Central access: PICC placed 06/05/19 TPN start date: chronic TPN PTA; 3/14 inpatient  Current TPN Rx [facility]): -15% Amino Acid 71 g/day, Dextrose 154 g/day, Lipids 36 g/day - provides 1168 kcal/day -Electrolytes:  Ca gluconate 5 meq, Mag sulfate 19.2 meq, Potassium Acetate 0 meq, Potassium Cl 5 meq, K Phosphate 10 mmol, Sodium Acetate 0 meq, Sodium Cl 77 meq, Sodium Phosphate 0 mmol -Additives:  Trace elements 1 mL, MVI 10 mL, Regular insulin 45 units -Administer over 12  hrs with 1 hr ramp up and 1 hr ramp down  Nutritional Goals (per RD rec on 3/15): 2350-2550 kCal, 140-155g AA, >2.3L fluid per day  Current Nutrition:  TPN Clear liquids - minimal intake; he is concerned about his lack of desire to eat a full meal.  Resource Breeze BID - 1 charted yesterday  Plan:  Continue concentrated cyclic TPN, infuse 1924 mls over 12 hrs: 87 mL/hr x 1 hr, then 175 mL/hr x 10 hrs, then 87 mL/hr x 1 hr.  TPN provides 142g AA, 327g CHO, 72g ILE for total 2404 kCal, meeting 100% of patient needs Electrolytes in TPN: increase K slightly, Cl:Ac 1:1 Daily trace element and folate in TPN, MWF multivitamin d/t national shortage  Continue mSSI Q4H + 50 units regular insulin in TPN (off Lantus 3/15) NS at 75 ml/hr per MD KCL 4/15 PO today F/U AM labs, I/O's, CBGs  Zaleigh Bermingham D. 05-23-1981, PharmD, BCPS, BCCCP 06/30/2019, 9:11 AM

## 2019-06-30 NOTE — Progress Notes (Signed)
PROGRESS NOTE    Caleb Chambers  ZOX:[id number] DOB: 02/16/1966 DOA: 06/27/2019 PCP: Patient, No Pcp Per   Brief Narrative:  Caleb Chambers is a 54 y.o. male with medical history significant for but not limited history of uncontrolled diabetes mellitus type 2 with a history of DKA, gout, hypertension, morbid obesity, pancreatitis, history of noncompliance who was recently just discharged from the hospital on 06/22/2018 where is found and admitted for diabetic ketoacidosis, AKI and acute pancreatitis with pseudocyst.  During that hospitalization he underwent multiple EGDs with cystogastrostomy tube creation and double-pigtail replacements.  He underwent multiple EGDs as well as necrosectomy treated for his acute pancreatitis with necrosis and infected pseudocyst with staph aureus.  He was placed on vancomycin and Flagyl and transition to doxycycline metronidazole at discharge.  Plan was for him to new antibiotics until repeat CT of the abdomen and pelvis in 2 weeks.  Patient was on TPN during that hospitalization and it was reduced to half the dose.  He now presents again diarrhea, nausea, vomiting and 10 out of 10 abdominal pain which was initially dull and stabbing and now is just throbbing.  Patient states that he woke up with diarrhea around 2 AM and had multiple episodes.  Tried to maintain his fluid balance and started vomiting and started having abdominal pain.  Was unable to keep anything down.  He called the gastroenterology office and he was advised to come to the ED for further evaluation recommendations.  In the ED he had a CT of the abdomen pelvis done and he had some abdominal pain that was radiating to the left.  Repeat CT of the abdomen pelvis was done and showed that there is still some residual persistent collections that were definitely not drained by the catheters adjacent to the uncinate process.  Patient clinically is not septic appearing but is complaining of significant abdominal pain.   Because of his uncontrolled symptoms he will be brought into the hospital for further evaluation and will be consulted for further recommendations.  ED Course: In the ED had basic blood work done as well as a CT of the abdomen and pelvis.  He was given a liter of normal saline and given pain control with hydromorphone and antiemetics with promethazine.  I started the patient on antibiotics with vancomycin, ceftriaxone and Flagyl and ordered blood cultures.  I asked the EDP to call GI and she is going to call the gastroenterologist on call for further evaluation.  **Interim History Gastroenterology was consulted for further evaluation recommendations and they are recommending continued supportive care with TPN and IV fluids as well as continue antibiotics for now.  Patient's pain has been uncontrolled so we will change from his IV fentanyl to IV hydromorphone and stop his oxycodone and start him on hydrocodone however despite this still remains uncontrolled.  Will increase the frequency of his hydromorphone and also try some IV ketorolac.  Patient was complaining of significant abdominal cramping and gas pain so we started him on simethicone and also started Bentyl.  Gastroenterology initially they recommend that once patient able to start Creon to restart at 72,000 units and 36,000 units with snacks.  Patient had replacement of his PICC line and now can start TPN.  He will get the full dose tonight given his poor p.o. intake.  Patient is very anxious and nervous so he was also started on some Xanax.  Antibiotics have been deescalated to IV cefepime 2 g every 8 hours as well  as Flagyl 500 mg every 8 hours.  Dietitian has also been consulted once patient is able to take more p.o.  For now gastroenterology thinks that they need to continue monitor symptoms in the hospital before they can make further decisions and they want to discuss with Dr. Meridee Score as well about what to do this patient.  Assessment &  Plan:   Active Problems:   Morbid obesity (HCC)   Abdominal pain, epigastric   Abnormal CT of the abdomen   Protein-calorie malnutrition, severe   Pancreatic necrosis   Infected pancreatic pseudocyst   Intractable nausea and vomiting   Abdominal pain   Diabetes mellitus type 2 in obese (HCC)   Malnutrition of moderate degree  Nausea vomiting diarrhea as well as abdominal pain in the setting of below rule out other etiologies including infectious -Admit to inpatient telemetry -CT of the abdomen pelvis as below -We will make the patient n.p.o. but GI advance diet to clear; patient still not tolerating clears as well and complains of continued abdominal pain -Status post 2 L of fluid in the ED. Then will place on normal saline at 75 MLS per hour -We will start TPN again given his concern for recurrent pancreatitis with a pseudocyst; last cultures from 211 grew out MSSA fluid from the cyst drainage and B Fragillis -Check blood cultures x2 and a GI pathogen panel; GI is also recommending adding a C. difficile via PCR; overnight the patient had 3 loose watery bowel movements but states that they are better than what they were -I clinically do not suspect C. difficile given the patient is afebrile and has no leukocytosis but GI wants to rule this out;  -Resume antibiotics as below and is now on IV vancomycin, IV IV ceftriaxone and IV Flagyl; antibiotics for now de-escalate from IV ceftriaxone and IV vancomycin to just IV cefepime will continue Flagyl -Antiemetics with Zofran and GI recommending scheduling every 8 hours despite this he still has some significant abdominal pain -GI evaluated and they feel that he has no significant changes on CT and recommending continuing with supportive care for now with TPN, IV fluids, IV antibiotics and scheduled Zofran -Unfortunately TPN could not be initiated due to his PICC not being long enough so PICC line was exchanged -Changed pain control with IV  fentanyl 25 mcg due to uncontrolled pain and will change to IV hydromorphone 1 mg every 3 hours as well p.o. hydrocodone-acetaminophen every 6 hours as needed; further increased his hydromorphone frequency to every 2 hours and also tried 1 dose of IV ketorolac -Also started the patient on Bentyl and simethicone -Appreciate further gastroenterology recommendations and subspecialty support given complex medical history and lack of improvement -Gastroenterology evaluated and has discussed the case with Dr. Irish Lack Roddy and they feel that the large fluid collection in the uncinate process is causing his issues and they got asked IR to see if the uncinate collection is amenable to drainage for now we will continue antibiotics  Recent Acute on Chronic Pancreatitis with Necrosis infected pseudocyst with staph aureus -Recently discharged on 06/22/2019 and he had Cystogastrostomy tube, EGDs and necrosectomy -Lipase level was 30 on admission -CT of the abdomen pelvis showed "Two cyst gastrostomy catheters decompressing previous large pancreatic pseudocyst. Residual ill-defined pancreatic low-density in the region of catheters. Two small heterogeneous collections persist that are not definitively drained by catheters, 2.5 x 1.7 cm adjacent to the pancreatic tail, and 4.8 x 2.8 cm collection adjacent to the uncinate process. Liquid  and solid stool throughout the colon, difficult to evaluate for perienteric fluid collections. Fluid in the subhepatic right upper quadrant appears to represent fluid within tortuous colon rather than a discrete pericolonic fluid collection. Portions of the mid splenic vein are not opacified and likely occluded. No visualized thrombus. Hepatic steatosis." -Resume Creon when able -We will reconsult Gastroenterology for further evaluation recommendations and they are recommending continue with supportive care as above with TPN, IV fluids, IV antibiotics and scheduled Zofran -IV antibiotics  as above -Continue with normal saline at 75 mils per hour for now -We will have pharmacy consulted for TPN administration given the patient will be n.p.o. and TPN is to be administered tonight however PICC line will need to be removed due to The PICC Line bing use were tried with vancomycin or the TPN  -Continue to monitor his pain status and it was very uncontrolled today so we have made adjustments as above -If he continues to have significant abdominal pain may need to reimage him again  Uncontrolled diabetes mellitus type 2 with hyperglycemia -Last hemoglobin A1c was 13.2 and now is 5.2 -Currently not in DKA as anion gap is normal but he does have a small metabolic acidosis -IV fluids as above -Hold Metformin XR 1000 units p.o. twice daily -Resume Lantus 11 units daily and will place on sensitive NovoLog sliding scale every 4 for now -We will change Lantus dosing to TPN now -Continue monitor CBGs carefully admission was 416 -Repeat CBGs trending down today on Lantus and sliding scale change Lantus to with the TPN -CBGs have been ranging from now 89-173  Essential Hypertension  -Blood pressures were not elevated but will hold his antihypertensives of lisinopril, amlodipine and budesonide -We will resume when he is taking p.o. but blood pressures are doing well without any antihypertensives and last blood pressure was 139/78  Metabolic Acidosis -As above -Patient CO2 was 18, anion gap is 13, and chloride was 100; repeat was showing a CO2 of 23, anion gap of 7, chloride level 108 this a.m. -Continue IV fluid hydration as above and repeat CMP in a.m.  Hyponatremia/Pseudohyponatremia  -Patient's sodium admission was 131 and likely low in the setting of hypoglycemia is now improved -Patient sodium is now 138 -Improved glycemic control and continue with IV fluid hydration and repeat CMP in a.m.  Moderate protein calorie malnutrition  -We will obtain nutritionist  consultation -Patient's prealbumin was 13.5 -Resume TPN at full dose today -Nutritionist also recommends boost breeze p.o. twice daily  Hypokalemia -Patient's potassium was 2.8 and today is 3.5 -Will be further replete per TPN -We will stop p.o. replacement given his poor p.o. intake -Continue monitor and trend and repeat CMP in a.m. -Further electrolyte correction to be done in TPN per pharmacy  Bilateral lower extremity edema -Likely in the setting of malnutrition, anemia and hypoalbuminemia with questionable venous insufficiency  -Hold Bumex and continue to encourage leg elevation -We will need to continue to ambulate  Gout -Resume allopurinol when able and take p.o.  GERD -We will change PPI to IV  Obesity -Estimated body mass index is 38.53 kg/m as calculated from the following:   Height as of this encounter:  (1.803 m).   Weight as of this encounter: 125.3 kg. -Weight Loss and Dietary Counseling given  Normocytic Anemia -Patient's hemoglobin/hematocrit has now dropped to 7.7/25.1 -Check anemia panel in the a.m. -Likely dilutional drop from IV fluid resuscitation and patient's dehydration -Continue to monitor for signs and symptoms of  bleeding; currently no overt bleeding noted -Repeat CBC in a.m.  Anxiety -We will start p.o. Xanax 0.5 mg twice daily as needed  DVT prophylaxis: Enoxaparin 65 mg subcu every 24 Code Status: FULL CODE Family Communication: No family present at bedside Disposition Plan: Patient is from home now requiring hospitalization due to intractable nausea, vomiting pending further improvement and tolerance of diet and evaluation by GI; currently has significant abdominal pain and is having 3 loose bowel movement.  Gastroenterology is involved and he may need further endoscopies given his significant history of pancreatitis  Consultants:   Gastroenterology  Pharmacy with TPN   Procedures: CT Scan   Antimicrobials:   Anti-infectives (From admission, onward)   Start     Dose/Rate Route Frequency Ordered Stop   06/29/19 1200  ceFEPIme (MAXIPIME) 2 g in sodium chloride 0.9 % 100 mL IVPB     2 g 200 mL/hr over 30 Minutes Intravenous Every 8 hours 06/29/19 1103     06/27/19 1830  cefTRIAXone (ROCEPHIN) 2 g in sodium chloride 0.9 % 100 mL IVPB  Status:  Discontinued     2 g 200 mL/hr over 30 Minutes Intravenous Every 24 hours 06/27/19 1823 06/29/19 1103   06/27/19 1830  vancomycin (VANCOREADY) IVPB 1500 mg/300 mL  Status:  Discontinued     1,500 mg 150 mL/hr over 120 Minutes Intravenous Every 12 hours 06/27/19 1823 06/29/19 1103   06/27/19 1745  metroNIDAZOLE (FLAGYL) IVPB 500 mg     500 mg 100 mL/hr over 60 Minutes Intravenous Every 8 hours 06/27/19 1744       Subjective: Seen and examined at bedside and he he is extremely frustrated and states that he is not improving and states that he is having significant amount of abdominal pain and cannot get comfortable.  States that it feels like that he has "2 stomachs".  And hurts worse on the left side.  States he is also having a lot of gas pain and abdominal cramping.  No nausea or vomiting but is having some diarrhea and loose stools but he states that this is improved since when he came in.  No other concerns or complaints at this time.  Objective: Vitals:   06/29/19 1300 06/29/19 2017 06/30/19 0301 06/30/19 1422  BP: 133/76 131/76 120/73 139/78  Pulse: 80 80 82 79  Resp: 18 18 18 18   Temp: 98.7 F (37.1 C) 98.7 F (37.1 C) 98.8 F (37.1 C) 98.3 F (36.8 C)  TempSrc: Oral Oral Oral Oral  SpO2: 100% 99% 100% 100%  Weight:      Height:        Intake/Output Summary (Last 24 hours) at 06/30/2019 1749 Last data filed at 06/30/2019 1423 Gross per 24 hour  Intake 1209.69 ml  Output 1025 ml  Net 184.69 ml   Filed Weights   06/27/19 1306 06/28/19 0457  Weight: 128 kg 125.3 kg   Examination: Physical Exam:  Constitutional: WN/WD Caleb Chambers  African-American male currently in some distress appears significantly uncomfortable complaining of significant abdominal pain, NAD and appears calm and comfortable Eyes: Lids and conjunctivae normal, sclerae anicteric  ENMT: External Ears, Nose appear normal. Grossly normal hearing.  Neck: Appears normal, supple, no cervical masses, normal ROM, no appreciable thyromegaly; no JVD Respiratory: Diminished to auscultation bilaterally, no wheezing, rales, rhonchi or crackles. Normal respiratory effort and patient is not tachypenic. No accessory muscle use.  Unlabored breathing Cardiovascular: RRR, no murmurs / rubs / gallops. S1 and S2 auscultated.  1+ lower  extremity edema bilaterally Abdomen: Soft, tender to palpate, distended secondary body habitus. Bowel sounds positive x4.  GU: Deferred. Musculoskeletal: No clubbing / cyanosis of digits/nails. No joint deformity upper and lower extremities.  Skin: No rashes, lesions, ulcers on limited skin evaluation. No induration; Warm and dry.  Neurologic: CN 2-12 grossly intact with no focal deficits. Romberg sign and cerebellar reflexes not assessed.  Psychiatric: Normal judgment and insight. Alert and oriented x 3.  Anxious and frustrated mood and appropriate affect.   Data Reviewed: I have personally reviewed following labs and imaging studies  CBC: Recent Labs  Lab 06/27/19 1316 06/27/19 1546 06/27/19 2326 06/29/19 0311 06/30/19 0251  WBC 9.3  --  5.3 5.2 4.9  NEUTROABS 8.3*  --  3.2 3.5 2.9  HGB 9.1* 9.9* 7.8* 7.7* 7.7*  HCT 31.4* 29.0* 25.3* 25.5* 25.1*  MCV 87.0  --  81.9 84.7 84.2  PLT 230  --  225 218 073   Basic Metabolic Panel: Recent Labs  Lab 06/27/19 1316 06/27/19 1546 06/27/19 2326 06/29/19 0311 06/30/19 0251  NA 131* 134* 135 138 138  K 3.8 3.9 3.7 2.8* 3.5  CL 100  --  103 108 108  CO2 18*  --  22 23 23   GLUCOSE 416*  --  260* 86 161*  BUN 10  --  8 6 12   CREATININE 0.63  --  0.62 0.63 0.59*  CALCIUM 8.6*  --  8.6*  8.4* 8.0*  MG 1.7  --  1.7 1.7 2.0  PHOS  --   --  3.7 4.0 3.9   GFR: Estimated Creatinine Clearance: 145.6 mL/min (A) (by C-G formula based on SCr of 0.59 mg/dL (L)). Liver Function Tests: Recent Labs  Lab 06/27/19 1316 06/27/19 2326 06/29/19 0311 06/30/19 0251  AST 11* 11* 11* 11*  ALT 9 8 9 8   ALKPHOS 99 87 78 70  BILITOT 0.5 0.3 0.5 0.4  PROT 7.4 6.8 6.4* 6.0*  ALBUMIN 2.6* 2.3* 2.1* 1.9*   Recent Labs  Lab 06/27/19 1316  LIPASE 30   No results for input(s): AMMONIA in the last 168 hours. Coagulation Profile: No results for input(s): INR, PROTIME in the last 168 hours. Cardiac Enzymes: No results for input(s): CKTOTAL, CKMB, CKMBINDEX, TROPONINI in the last 168 hours. BNP (last 3 results) No results for input(s): PROBNP in the last 8760 hours. HbA1C: Recent Labs    06/27/19 2326  HGBA1C 8.7*   CBG: Recent Labs  Lab 06/29/19 2326 06/30/19 0302 06/30/19 0801 06/30/19 1114 06/30/19 1605  GLUCAP 173* 152* 148* 164* 139*   Lipid Profile: Recent Labs    06/27/19 2326 06/29/19 0311  TRIG 86 115   Thyroid Function Tests: Recent Labs    06/27/19 2326  TSH 1.243   Anemia Panel: No results for input(s): VITAMINB12, FOLATE, FERRITIN, TIBC, IRON, RETICCTPCT in the last 72 hours. Sepsis Labs: Recent Labs  Lab 06/27/19 1523  LATICACIDVEN 1.2    Recent Results (from the past 240 hour(s))  SARS CORONAVIRUS 2 (TAT 6-24 HRS) Nasopharyngeal Nasopharyngeal Swab     Status: None   Collection Time: 06/27/19  5:27 PM   Specimen: Nasopharyngeal Swab  Result Value Ref Range Status   SARS Coronavirus 2 NEGATIVE NEGATIVE Final    Comment: (NOTE) SARS-CoV-2 target nucleic acids are NOT DETECTED. The SARS-CoV-2 RNA is generally detectable in upper and lower respiratory specimens during the acute phase of infection. Negative results do not preclude SARS-CoV-2 infection, do not rule out co-infections with other  pathogens, and should not be used as the sole basis  for treatment or other patient management decisions. Negative results must be combined with clinical observations, patient history, and epidemiological information. The expected result is Negative. Fact Sheet for Patients: HairSlick.no Fact Sheet for Healthcare Providers: quierodirigir.com This test is not yet approved or cleared by the [place] FDA and  has been authorized for detection and/or diagnosis of SARS-CoV-2 by FDA under an Emergency Use Authorization (EUA). This EUA will remain  in effect (meaning this test can be used) for the duration of the COVID-19 declaration under Section 56 4(b)(1) of the Act, 21 U.S.C. section 360bbb-3(b)(1), unless the authorization is terminated or revoked sooner. Performed at [facility], [address], [place], [place] [address]   Culture, blood (routine x 2)     Status: None (Preliminary result)   Collection Time: 06/27/19 11:35 PM   Specimen: BLOOD  Result Value Ref Range Status   Specimen Description BLOOD RIGHT HAND  Final   Special Requests   Final    BOTTLES DRAWN AEROBIC AND ANAEROBIC Blood Culture adequate volume   Culture   Final    NO GROWTH 2 DAYS Performed at [facility], [address], [place], [place] [address]    Report Status PENDING  Incomplete  Culture, blood (routine x 2)     Status: None (Preliminary result)   Collection Time: 06/27/19 11:37 PM   Specimen: BLOOD  Result Value Ref Range Status   Specimen Description BLOOD RIGHT FOREARM  Final   Special Requests   Final    BOTTLES DRAWN AEROBIC AND ANAEROBIC Blood Culture adequate volume   Culture   Final    NO GROWTH 2 DAYS Performed at [facility], [address], [place], [place] [address]    Report Status PENDING  Incomplete  Urine culture     Status: None   Collection Time: 06/28/19  5:12 AM   Specimen: Urine, Clean Catch  Result Value Ref Range Status   Specimen Description  URINE, CLEAN CATCH  Final   Special Requests NONE  Final   Culture   Final    NO GROWTH Performed at [facility], [address], [place], [place] [address]    Report Status 06/29/2019 FINAL  Final  GI pathogen panel by PCR, stool     Status: None   Collection Time: 06/29/19 12:38 PM   Specimen: Stool  Result Value Ref Range Status   Plesiomonas shigelloides NOT DETECTED NOT DETECTED Final   Yersinia enterocolitica NOT DETECTED NOT DETECTED Final   Vibrio NOT DETECTED NOT DETECTED Final   Enteropathogenic E coli NOT DETECTED NOT DETECTED Final   E coli (ETEC) LT/ST NOT DETECTED NOT DETECTED Final   E coli 0157 by PCR Not applicable NOT DETECTED Final   Cryptosporidium by PCR NOT DETECTED NOT DETECTED Final   Entamoeba histolytica NOT DETECTED NOT DETECTED Final   Adenovirus F 40/41 NOT DETECTED NOT DETECTED Final   Norovirus GI/GII NOT DETECTED NOT DETECTED Final   Sapovirus NOT DETECTED NOT DETECTED Final    Comment: (NOTE) Performed At: [facility] [address] [place], [place] [id number] Jolene Schimke MD TO:[id number]    Vibrio cholerae NOT DETECTED NOT DETECTED Final   Campylobacter by PCR NOT DETECTED NOT DETECTED Final   Salmonella by PCR NOT DETECTED NOT DETECTED Final   E coli (STEC) NOT DETECTED NOT DETECTED Final   Enteroaggregative E coli NOT DETECTED NOT  DETECTED Final   Shigella by PCR NOT DETECTED NOT DETECTED Final   Cyclospora cayetanensis NOT DETECTED NOT DETECTED Final   Astrovirus NOT DETECTED NOT DETECTED Final   G lamblia by PCR NOT DETECTED NOT DETECTED Final   Rotavirus A by PCR NOT DETECTED NOT DETECTED Final  C difficile quick scan w PCR reflex     Status: None   Collection Time: 06/29/19 12:38 PM   Specimen: Stool  Result Value Ref Range Status   C Diff antigen NEGATIVE NEGATIVE Final   C Diff toxin NEGATIVE NEGATIVE Final   C Diff interpretation No C. difficile detected.  Final    Comment: Performed at [facility],  [address], [place], [place] [address]     RN Pressure Injury Documentation:     Estimated body mass index is 38.53 kg/m as calculated from the following:   Height as of this encounter: 5\' 11"  (1.803 m).   Weight as of this encounter: 125.3 kg.  Malnutrition Type:  Nutrition Problem: Moderate Malnutrition Etiology: chronic illness(pancreatitis)   Malnutrition Characteristics:  Signs/Symptoms: percent weight loss, energy intake < or equal to 75% for > or equal to 1 month, mild fat depletion, moderate fat depletion, mild muscle depletion, moderate muscle depletion Percent weight loss: 13.7 %   Nutrition Interventions:  Interventions: Boost Breeze, TPN  Radiology Studies: EKG SITE RITE  Result Date: 06/28/2019 If Site Rite image not attached, placement could not be confirmed due to current cardiac rhythm.  Scheduled Meds: . allopurinol  300 mg Oral Daily  . Chlorhexidine Gluconate Cloth  6 each Topical Daily  . dicyclomine  20 mg Oral TID AC & HS  . enoxaparin (LOVENOX) injection  0.5 mg/kg Subcutaneous Q24H  . insulin aspart  0-15 Units Subcutaneous Q4H  . lipase/protease/amylase  36,000 Units Oral TID AC  . pantoprazole  40 mg Oral BID  . sodium chloride flush  10-40 mL Intracatheter Q12H  . sodium chloride flush  10-40 mL Intracatheter Q12H   Continuous Infusions: . sodium chloride 75 mL/hr at 06/30/19 1744  . ceFEPime (MAXIPIME) IV 2 g (06/30/19 1405)  . metronidazole 500 mg (06/30/19 1745)  . TPN CYCLIC-ADULT (ION) 87 mL/hr at 06/30/19 1725    LOS: 3 days   07/02/19, DO [facility] PAGER is on AMION  If 7PM-7AM, please contact night-coverage [email or web address]

## 2019-06-30 NOTE — Plan of Care (Signed)
  Problem: Education: Goal: Knowledge of General Education information will improve Description: Including pain rating scale, medication(s)/side effects and non-pharmacologic comfort measures Outcome: Progressing   Problem: Activity: Goal: Risk for activity intolerance will decrease Outcome: Progressing   Problem: Nutrition: Goal: Adequate nutrition will be maintained Outcome: Progressing   Problem: Coping: Goal: Level of anxiety will decrease Outcome: Progressing   

## 2019-06-30 NOTE — Progress Notes (Signed)
Subjective: Feeling better with the pain medication.  Objective: Vital signs in last 24 hours: Temp:  [98.3 F (36.8 C)-98.8 F (37.1 C)] 98.3 F (36.8 C) (03/16 1422) Pulse Rate:  [79-82] 79 (03/16 1422) Resp:  [18] 18 (03/16 1422) BP: (120-139)/(73-78) 139/78 (03/16 1422) SpO2:  [99 %-100 %] 100 % (03/16 1422) Last BM Date: 06/29/19  Intake/Output from previous day: 03/15 0701 - 03/16 0700 In: 619.7 [P.O.:360; I.V.:10; IV Piggyback:249.7] Out: 750 [Urine:750] Intake/Output this shift: Total I/O In: 600 [P.O.:600] Out: 275 [Urine:275]  General appearance: alert and no distress GI: some tenderness in the left lower abdomen  Lab Results: Recent Labs    06/27/19 2326 06/29/19 0311 06/30/19 0251  WBC 5.3 5.2 4.9  HGB 7.8* 7.7* 7.7*  HCT 25.3* 25.5* 25.1*  PLT 225 218 211   BMET Recent Labs    06/27/19 2326 06/29/19 0311 06/30/19 0251  NA 135 138 138  K 3.7 2.8* 3.5  CL 103 108 108  CO2 22 23 23   GLUCOSE 260* 86 161*  BUN 8 6 12   CREATININE 0.62 0.63 0.59*  CALCIUM 8.6* 8.4* 8.0*   LFT Recent Labs    06/30/19 0251  PROT 6.0*  ALBUMIN 1.9*  AST 11*  ALT 8  ALKPHOS 70  BILITOT 0.4   PT/INR No results for input(s): LABPROT, INR in the last 72 hours. Hepatitis Panel No results for input(s): HEPBSAG, HCVAB, HEPAIGM, HEPBIGM in the last 72 hours. C-Diff Recent Labs    06/29/19 1238  CDIFFTOX NEGATIVE   Fecal Lactopherrin No results for input(s): FECLLACTOFRN in the last 72 hours.  Studies/Results: 07/02/19 EKG SITE RITE  Result Date: 06/28/2019 If Site Rite image not attached, placement could not be confirmed due to current cardiac rhythm.   Medications:  Scheduled: . allopurinol  300 mg Oral Daily  . Chlorhexidine Gluconate Cloth  6 each Topical Daily  . dicyclomine  20 mg Oral TID AC & HS  . enoxaparin (LOVENOX) injection  0.5 mg/kg Subcutaneous Q24H  . insulin aspart  0-15 Units Subcutaneous Q4H  . lipase/protease/amylase  36,000 Units Oral  TID AC  . pantoprazole  40 mg Oral BID  . sodium chloride flush  10-40 mL Intracatheter Q12H  . sodium chloride flush  10-40 mL Intracatheter Q12H   Continuous: . sodium chloride 75 mL/hr at 06/30/19 1744  . ceFEPime (MAXIPIME) IV 2 g (06/30/19 1405)  . metronidazole 500 mg (06/30/19 1745)  . TPN CYCLIC-ADULT (ION) 87 mL/hr at 06/30/19 1725    Assessment/Plan: 1) Heterogenous fluid collections in the uncinate and tail of the pancreas. 2) ABD pain.   Currently he is stable.  He is afebrile, normotensive, and not tachycardic.  The pain is the main issue for him and there are residual fluid collections identified in the uncinate and tail of the pancreas.  It does not appear that these collections are being drained by the current stents.  His WBC is normal and currently he is on cefepime and metronidazole.  Dr. 07/02/19 was kind enough to correspond with me while he is out-of-town.  There is the possibility that the larger fluid collection in the uncinate process is causing his issues.  Plan: 1) I will discuss with IR tomorrow to see if the uncinate fluid collection is amenable to drainage. 2) Continue with antibiotics.     LOS: 3 days   Najeh Credit D 06/30/2019, 5:44 PM

## 2019-07-01 LAB — BASIC METABOLIC PANEL
Anion gap: 9 (ref 5–15)
BUN: 16 mg/dL (ref 6–20)
CO2: 20 mmol/L — ABNORMAL LOW (ref 22–32)
Calcium: 8 mg/dL — ABNORMAL LOW (ref 8.9–10.3)
Chloride: 107 mmol/L (ref 98–111)
Creatinine, Ser: 0.56 mg/dL — ABNORMAL LOW (ref 0.61–1.24)
GFR calc Af Amer: >60 mL/min (ref >60)
GFR calc non Af Amer: >60 mL/min (ref >60)
Glucose, Bld: 192 mg/dL — ABNORMAL HIGH (ref 70–99)
Potassium: 3.6 mmol/L (ref 3.5–5.1)
Sodium: 136 mmol/L (ref 135–145)

## 2019-07-01 LAB — GLUCOSE, CAPILLARY
Glucose-Capillary: 173 mg/dL — ABNORMAL HIGH (ref 70–99)
Glucose-Capillary: 197 mg/dL — ABNORMAL HIGH (ref 70–99)
Glucose-Capillary: 167 mg/dL — ABNORMAL HIGH (ref 70–99)
Glucose-Capillary: 193 mg/dL — ABNORMAL HIGH (ref 70–99)

## 2019-07-01 LAB — MAGNESIUM: Magnesium: 1.9 mg/dL (ref 1.7–2.4)

## 2019-07-01 MED ORDER — DIPHENHYDRAMINE HCL 25 MG PO CAPS
25.0000 mg | ORAL_CAPSULE | Freq: Four times a day (QID) | ORAL | Status: AC | PRN
  Administered 2019-07-01 – 2019-07-02 (×2): 25 mg via ORAL
  Filled 2019-07-01 (×2): qty 1

## 2019-07-01 MED ORDER — TRACE MINERALS CU-MN-SE-ZN 300-55-60-3000 MCG/ML IV SOLN
INTRAVENOUS | Status: AC
  Filled 2019-07-01: qty 949.17

## 2019-07-01 NOTE — Plan of Care (Signed)

## 2019-07-01 NOTE — Progress Notes (Signed)
PHARMACY - TOTAL PARENTERAL NUTRITION CONSULT NOTE  Indication:  Infected pancreatic pseudocyst  Patient Measurements: Height: 5\' 11"  (180.3 cm) Weight: 276 lb 3.8 oz (125.3 kg) IBW/kg (Calculated) : 75.3 TPN AdjBW (KG): 88.5 Body mass index is 38.53 kg/m.  Assessment:  Caleb Chambers on chronic TPN presenting 06/27/19 with N/V/D and 10/10 abdominal pain. PMH significant for uncontrolled DM2, gout, HTN, pancreatitis and history of noncompliance. Patient recently discharged 06/22/19 after admission for DKA, AKI, and acute pancreatitis with infected pseudocyst, s/p multiple EGD, necrosectomy procedures with cyst gastrostomy. Patient was discharged on doxy and metronidazole. TPN was reduced during previous hospitalization to 1/2 dose on discharge to supplement PO diet. Antibiotics have been broadened and GI consulted. Patient reports no missed days of TPN or toleration issues PTA, last 3/13.  Glucose / Insulin: A1c 13.2 > 8.7%. CBGs 300-400s initially, then low d/t poor PO intake, now mildly elevated with increased PO intake.  Required 50 units of insulin in TPN + 18 units SSI. Electrolytes: all WNL (K low normal) Renal: SCr and BUN WNL LFTs / TGs: LFTs / tbili / TG WNL Prealbumin / albumin: pre-albumin 13.5, albumin 2.1 Intake / Output; MIVF: UOP 0.1 ml/kg/hr, LBM 3/12, NS at 75 ml/hr, net +6.3L.  Diarrhea improving per patient. GI Imaging: 3/13 CT abd/pelvis: residual persistent collections Surgeries / Procedures: IR to drain uncinate fluid collection if able  Central access: PICC placed 06/05/19 TPN start date: chronic TPN PTA; 3/14 inpatient  Current TPN Rx [facility]): -15% Amino Acid 71 g/day, Dextrose 154 g/day, Lipids 36 g/day - provides 1168 kcal/day -Electrolytes:  Ca gluconate 5 meq, Mag sulfate 19.2 meq, Potassium Acetate 0 meq, Potassium Cl 5 meq, K Phosphate 10 mmol, Sodium Acetate 0 meq, Sodium Cl 77 meq, Sodium Phosphate 0 mmol -Additives:  Trace elements 1 mL, MVI 10 mL, Regular insulin 45  units -Administer over 12 hrs with 1 hr ramp up and 1 hr ramp down  Nutritional Goals (per RD rec on 3/16): 2350-2550 kCal, 140-155g AA, >2.3L fluid per day  Current Nutrition:  TPN Clear liquids - consuming up to 75% of meals  Plan:  Continue concentrated cyclic TPN, infuse 1924 mls over 12 hrs: 87 mL/hr x 1 hr, then 175 mL/hr x 10 hrs, then 87 mL/hr x 1 hr.  TPN provides 142g AA, 327g CHO, 72g ILE for total 2404 kCal, meeting 100% of patient needs Electrolytes in TPN: increase K slightly, Cl:Ac 1:1 Daily trace element and folate in TPN, MWF multivitamin d/t national shortage  Continue mSSI Q4H + increase regular insulin in TPN to 57 units (off Lantus 3/15) NS at 75 ml/hr per MD F/U AM labs, I/O's, CBGs  Caleb Rengel D. 4/15, PharmD, BCPS, BCCCP 07/01/2019, 7:29 AM

## 2019-07-01 NOTE — Progress Notes (Signed)
PROGRESS NOTE    Caleb Chambers  KGU:[id number] DOB: Feb 14, 1966 DOA: 06/27/2019 PCP: Patient, No Pcp Per    Brief Narrative:  Caleb Chambers  Is a 54 year old male with past medical history remarkable for type 2 diabetes mellitus, essential hypertension, morbid obesity, pancreatitis with pseudocyst, gout, and history of noncompliance who presented with intractable nausea/vomiting and associated abdominal pain.  He called his gastroenterology office, who advised him to proceed to the ED for further evaluation.  Recently hospitalized and discharged on 06/22/2019 for DKA, AKI, and acute pancreatitis with pseudocyst.  During the hospitalization he underwent multiple EGDs with cysto gastrostomy tube creation and double pigtail replacements.  Patient also underwent necrosectomy for his acute pancreatitis with necrosis and infected pseudocyst with Staph aureus.  He was initially placed on vancomycin and Flagyl and transition to doxycycline and metronidazole at discharge.  Plan was to continue antibiotics for 2 weeks until repeat CT abdomen /pelvis was performed.  Patient was also on TPN which was reduced to half dose at time of discharge.  In the ED had basic blood work done as well as a CT of the abdomen and pelvis. He was given a liter of normal saline and given pain control with hydromorphone and antiemetics with promethazine. Patient was started the patient on antibiotics with vancomycin, ceftriaxone and Flagyl and ordered blood cultures. I asked the EDP to [place] and she is going to call the gastroenterologist on call for further evaluation.  Assessment & Plan:   Active Problems:   Morbid obesity (HCC)   Abdominal pain, epigastric   Abnormal CT of the abdomen   Protein-calorie malnutrition, severe   Pancreatic necrosis   Infected pancreatic pseudocyst   Intractable nausea and vomiting   Abdominal pain   Diabetes mellitus type 2 in obese (HCC)   Malnutrition of moderate degree   Acute  on chronic pancreatitis with necrosis Infected pseudocyst with MSSA Patient presenting with recurrent nausea/vomiting and abdominal pain. CT abd pelvis notable for two cystgastrostomy catheters decompressiing previous large pancreatic pseudocyst and two small heterogeneous collections persisting not drained by catheters; one adjacent to pancreatic tail and one adjacent to uncinate process. --GI Dr. Benson Norway Following; appreciate assistance --GI discussed with radiology; unable to place drain surrounding uncinate process given proximity to SMA --Continue antibiotics with Cefepime and Flagyl --Continue TPN --GI advancing diet to regular --continue creon  Type 2 diabetes mellitus, poorly controlled Hemoglobin A1c 8.7 06/27/2019; improved from 13.2 04/22/2019.  --continue insulin in TPN, increased to 57u by pharmacy today --Continue ISS for further coverage  Gout: continue allopurinol 332m PO daily  Pseudohyponatremia Etiology etiology likely secondary to hyperglycemia, now resolved with improved glycemic control. --monitor BMP daily  Moderate protein calorie malnutrition Prealbumin 13.5. --Nutrition following, appreciate assistance --continue full-dose TPN --boost Breeze twice daily --GI advancing diet today  Nutrition Status: Nutrition Problem: Moderate Malnutrition Etiology: chronic illness(pancreatitis) Signs/Symptoms: percent weight loss, energy intake < or equal to 75% for > or equal to 1 month, mild fat depletion, moderate fat depletion, mild muscle depletion, moderate muscle depletion Percent weight loss: 13.7 % Interventions: Boost Breeze, TPN  Hypokalemia Repleted during hospitalization.  Potassium 3.6 today. --Continue supplementation per nutrition/TPN --BMP daily  GERD: continue PPI  Obesity Body mass index is 38.53 kg/m. discussed need for lifestyle changes /weight reduction as this complicates all facets of care  Normocytic anemia Hemoglobin stable, 7.7  Anxiety:  continue xanax prn   DVT prophylaxis: Lovenox Code Status: Full Code Family Communication: Updated patient at bedside  Disposition Plan:      Patient is from: Home     Anticipated Disposition: Home with Home health RN, TPN     Barriers to discharge or conditions that needs to be met prior to discharge: improvement   Consultants:   GI - Dr. Benson Norway  Procedures:   none  Antimicrobials:   Cefepime  Flagyl   Subjective: Patient seen and examined at bedside. Resting comfortably. Abdominal pain improved. Denies N/V. No other complaints. GI advancing diet. No overnight concerns per nursing staff.  Objective: Vitals:   06/30/19 2006 07/01/19 0329 07/01/19 0800 07/01/19 1306  BP: 135/79 120/68 (!) 142/84 140/80  Pulse: 72 85 84 85  Resp: '17 18 18 16  ' Temp: 99 F (37.2 C) 98 F (36.7 C) 98.8 F (37.1 C) 98.6 F (37 C)  TempSrc: Oral  Oral Oral  SpO2: 100% 100% 100% 100%  Weight:      Height:        Intake/Output Summary (Last 24 hours) at 07/01/2019 1634 Last data filed at 07/01/2019 1500 Gross per 24 hour  Intake 3961.25 ml  Output 500 ml  Net 3461.25 ml   Filed Weights   06/27/19 1306 06/28/19 0457  Weight: 128 kg 125.3 kg    Examination:  General exam: Appears calm and comfortable  Respiratory system: Clear to auscultation. Respiratory effort normal. Cardiovascular system: S1 & S2 heard, RRR. No JVD, murmurs, rubs, gallops or clicks. No pedal edema. Gastrointestinal system: Abdomen is nondistended, soft and nontender. No organomegaly or masses felt. Normal bowel sounds heard. Central nervous system: Alert and oriented. No focal neurological deficits. Extremities: Symmetric 5 x 5 power. Skin: No rashes, lesions or ulcers Psychiatry: Judgement and insight appear normal. Mood & affect appropriate.     Data Reviewed: I have personally reviewed following labs and imaging studies  CBC: Recent Labs  Lab 06/27/19 1316 06/27/19 1546 06/27/19 2326  06/29/19 0311 06/30/19 0251  WBC 9.3  --  5.3 5.2 4.9  NEUTROABS 8.3*  --  3.2 3.5 2.9  HGB 9.1* 9.9* 7.8* 7.7* 7.7*  HCT 31.4* 29.0* 25.3* 25.5* 25.1*  MCV 87.0  --  81.9 84.7 84.2  PLT 230  --  225 218 915   Basic Metabolic Panel: Recent Labs  Lab 06/27/19 1316 06/27/19 1316 06/27/19 1546 06/27/19 2326 06/29/19 0311 06/30/19 0251 07/01/19 0438  NA 131*   < > 134* 135 138 138 136  K 3.8   < > 3.9 3.7 2.8* 3.5 3.6  CL 100  --   --  103 108 108 107  CO2 18*  --   --  '22 23 23 ' 20*  GLUCOSE 416*  --   --  260* 86 161* 192*  BUN 10  --   --  '8 6 12 16  ' CREATININE 0.63  --   --  0.62 0.63 0.59* 0.56*  CALCIUM 8.6*  --   --  8.6* 8.4* 8.0* 8.0*  MG 1.7  --   --  1.7 1.7 2.0 1.9  PHOS  --   --   --  3.7 4.0 3.9  --    < > = values in this interval not displayed.   GFR: Estimated Creatinine Clearance: 145.6 mL/min (A) (by C-G formula based on SCr of 0.56 mg/dL (L)). Liver Function Tests: Recent Labs  Lab 06/27/19 1316 06/27/19 2326 06/29/19 0311 06/30/19 0251  AST 11* 11* 11* 11*  ALT '9 8 9 8  ' ALKPHOS 99 87 78 70  BILITOT 0.5 0.3 0.5 0.4  PROT 7.4 6.8 6.4* 6.0*  ALBUMIN 2.6* 2.3* 2.1* 1.9*   Recent Labs  Lab 06/27/19 1316  LIPASE 30   No results for input(s): AMMONIA in the last 168 hours. Coagulation Profile: No results for input(s): INR, PROTIME in the last 168 hours. Cardiac Enzymes: No results for input(s): CKTOTAL, CKMB, CKMBINDEX, TROPONINI in the last 168 hours. BNP (last 3 results) No results for input(s): PROBNP in the last 8760 hours. HbA1C: No results for input(s): HGBA1C in the last 72 hours. CBG: Recent Labs  Lab 06/30/19 1605 06/30/19 2007 06/30/19 2331 07/01/19 0330 07/01/19 1211  GLUCAP 139* 176* 248* 173* 197*   Lipid Profile: Recent Labs    06/29/19 0311  TRIG 115   Thyroid Function Tests: No results for input(s): TSH, T4TOTAL, FREET4, T3FREE, THYROIDAB in the last 72 hours. Anemia Panel: No results for input(s): VITAMINB12,  FOLATE, FERRITIN, TIBC, IRON, RETICCTPCT in the last 72 hours. Sepsis Labs: Recent Labs  Lab 06/27/19 1523  LATICACIDVEN 1.2    Recent Results (from the past 240 hour(s))  SARS CORONAVIRUS 2 (TAT 6-24 HRS) Nasopharyngeal Nasopharyngeal Swab     Status: None   Collection Time: 06/27/19  5:27 PM   Specimen: Nasopharyngeal Swab  Result Value Ref Range Status   SARS Coronavirus 2 NEGATIVE NEGATIVE Final    Comment: (NOTE) SARS-CoV-2 target nucleic acids are NOT DETECTED. The SARS-CoV-2 RNA is generally detectable in upper and lower respiratory specimens during the acute phase of infection. Negative results do not preclude SARS-CoV-2 infection, do not rule out co-infections with other pathogens, and should not be used as the sole basis for treatment or other patient management decisions. Negative results must be combined with clinical observations, patient history, and epidemiological information. The expected result is Negative. Fact Sheet for Patients: SugarRoll.be Fact Sheet for Healthcare Providers: https://www.woods-mathews.com/ This test is not yet approved or cleared by the [place] FDA and  has been authorized for detection and/or diagnosis of SARS-CoV-2 by FDA under an Emergency Use Authorization (EUA). This EUA will remain  in effect (meaning this test can be used) for the duration of the COVID-19 declaration under Section 56 4(b)(1) of the Act, 21 U.S.C. section 360bbb-3(b)(1), unless the authorization is terminated or revoked sooner. Performed at [facility], [place] [address], [place], [place] [address]   Culture, blood (routine x 2)     Status: None (Preliminary result)   Collection Time: 06/27/19 11:35 PM   Specimen: BLOOD  Result Value Ref Range Status   Specimen Description BLOOD RIGHT HAND  Final   Special Requests   Final    BOTTLES DRAWN AEROBIC AND ANAEROBIC Blood Culture adequate volume   Culture   Final     NO GROWTH 3 DAYS Performed at [facility], [place] [address] [place], [place] [address]    Report Status PENDING  Incomplete  Culture, blood (routine x 2)     Status: None (Preliminary result)   Collection Time: 06/27/19 11:37 PM   Specimen: BLOOD  Result Value Ref Range Status   Specimen Description BLOOD RIGHT FOREARM  Final   Special Requests   Final    BOTTLES DRAWN AEROBIC AND ANAEROBIC Blood Culture adequate volume   Culture   Final    NO GROWTH 3 DAYS Performed at [facility], [place] [address], [place],  [address]    Report Status PENDING  Incomplete  Urine culture     Status:  None   Collection Time: 06/28/19  5:12 AM   Specimen: Urine, Clean Catch  Result Value Ref Range Status   Specimen Description URINE, CLEAN CATCH  Final   Special Requests NONE  Final   Culture   Final    NO GROWTH Performed at [facility], [address], [place], [place] [address]    Report Status 06/29/2019 FINAL  Final  GI pathogen panel by PCR, stool     Status: None   Collection Time: 06/29/19 12:38 PM   Specimen: Stool  Result Value Ref Range Status   Plesiomonas shigelloides NOT DETECTED NOT DETECTED Final   Yersinia enterocolitica NOT DETECTED NOT DETECTED Final   Vibrio NOT DETECTED NOT DETECTED Final   Enteropathogenic E coli NOT DETECTED NOT DETECTED Final   E coli (ETEC) LT/ST NOT DETECTED NOT DETECTED Final   E coli 1027 by PCR Not applicable NOT DETECTED Final   Cryptosporidium by PCR NOT DETECTED NOT DETECTED Final   Entamoeba histolytica NOT DETECTED NOT DETECTED Final   Adenovirus F 40/41 NOT DETECTED NOT DETECTED Final   Norovirus GI/GII NOT DETECTED NOT DETECTED Final   Sapovirus NOT DETECTED NOT DETECTED Final    Comment: (NOTE) Performed At: [facility] [place], [place] [id number] Rush Farmer MD [id number]    Vibrio cholerae NOT DETECTED NOT DETECTED Final   Campylobacter by PCR NOT DETECTED NOT DETECTED Final    Salmonella by PCR NOT DETECTED NOT DETECTED Final   E coli (STEC) NOT DETECTED NOT DETECTED Final   Enteroaggregative E coli NOT DETECTED NOT DETECTED Final   Shigella by PCR NOT DETECTED NOT DETECTED Final   Cyclospora cayetanensis NOT DETECTED NOT DETECTED Final   Astrovirus NOT DETECTED NOT DETECTED Final   G lamblia by PCR NOT DETECTED NOT DETECTED Final   Rotavirus A by PCR NOT DETECTED NOT DETECTED Final  C difficile quick scan w PCR reflex     Status: None   Collection Time: 06/29/19 12:38 PM   Specimen: Stool  Result Value Ref Range Status   C Diff antigen NEGATIVE NEGATIVE Final   C Diff toxin NEGATIVE NEGATIVE Final   C Diff interpretation No C. difficile detected.  Final    Comment: Performed at [facility], [place] [address] [place], [place] [address]         Radiology Studies: No results found.      Scheduled Meds: . allopurinol  300 mg Oral Daily  . Chlorhexidine Gluconate Cloth  6 each Topical Daily  . dicyclomine  20 mg Oral TID AC & HS  . enoxaparin (LOVENOX) injection  0.5 mg/kg Subcutaneous Q24H  . insulin aspart  0-15 Units Subcutaneous Q4H  . lipase/protease/amylase  36,000 Units Oral TID AC  . pantoprazole  40 mg Oral BID  . sodium chloride flush  10-40 mL Intracatheter Q12H  . sodium chloride flush  10-40 mL Intracatheter Q12H   Continuous Infusions: . sodium chloride 75 mL/hr at 06/30/19 1744  . ceFEPime (MAXIPIME) IV 2 g (07/01/19 1300)  . metronidazole 500 mg (07/01/19 1059)  . TPN CYCLIC-ADULT (ION)       LOS: 4 days    Time spent: 34 minutes spent on chart review, discussion with nursing staff, consultants, updating family and interview/physical exam; more than 50% of that time was spent in counseling and/or coordination of care.    Randy Whitener J [place] DO [facility] Available via Epic secure chat 7am-7pm After these hours, please  refer to coverage provider listed on [email or web address] 07/01/2019, 4:34 PM

## 2019-07-01 NOTE — Progress Notes (Signed)
Subjective: No new complaints.  Feeling well.  Objective: Vital signs in last 24 hours: Temp:  [98 F (36.7 C)-99 F (37.2 C)] 98.6 F (37 C) (03/17 1306) Pulse Rate:  [72-85] 85 (03/17 1306) Resp:  [16-18] 16 (03/17 1306) BP: (120-142)/(68-84) 140/80 (03/17 1306) SpO2:  [100 %] 100 % (03/17 1306) Last BM Date: 06/29/19  Intake/Output from previous day: 03/16 0701 - 03/17 0700 In: 4321.3 [P.O.:600; I.V.:2931.7; IV Piggyback:789.5] Out: 275 [Urine:275] Intake/Output this shift: Total I/O In: 0  Out: 250 [Urine:250]  General appearance: alert and no distress GI: soft, non-tender; bowel sounds normal; no masses,  no organomegaly  Lab Results: Recent Labs    06/29/19 0311 06/30/19 0251  WBC 5.2 4.9  HGB 7.7* 7.7*  HCT 25.5* 25.1*  PLT 218 211   BMET Recent Labs    06/29/19 0311 06/30/19 0251 07/01/19 0438  NA 138 138 136  K 2.8* 3.5 3.6  CL 108 108 107  CO2 23 23 20*  GLUCOSE 86 161* 192*  BUN 6 12 16   CREATININE 0.63 0.59* 0.56*  CALCIUM 8.4* 8.0* 8.0*   LFT Recent Labs    06/30/19 0251  PROT 6.0*  ALBUMIN 1.9*  AST 11*  ALT 8  ALKPHOS 70  BILITOT 0.4   PT/INR No results for input(s): LABPROT, INR in the last 72 hours. Hepatitis Panel No results for input(s): HEPBSAG, HCVAB, HEPAIGM, HEPBIGM in the last 72 hours. C-Diff Recent Labs    06/29/19 1238  CDIFFTOX NEGATIVE   Fecal Lactopherrin No results for input(s): FECLLACTOFRN in the last 72 hours.  Studies/Results: No results found.  Medications:  Scheduled: . allopurinol  300 mg Oral Daily  . Chlorhexidine Gluconate Cloth  6 each Topical Daily  . dicyclomine  20 mg Oral TID AC & HS  . enoxaparin (LOVENOX) injection  0.5 mg/kg Subcutaneous Q24H  . insulin aspart  0-15 Units Subcutaneous Q4H  . lipase/protease/amylase  36,000 Units Oral TID AC  . pantoprazole  40 mg Oral BID  . sodium chloride flush  10-40 mL Intracatheter Q12H  . sodium chloride flush  10-40 mL Intracatheter Q12H    Continuous: . sodium chloride 75 mL/hr at 06/30/19 1744  . ceFEPime (MAXIPIME) IV 2 g (07/01/19 1300)  . metronidazole 500 mg (07/01/19 1059)  . TPN CYCLIC-ADULT (ION)      Assessment/Plan: 1) S/p cystgastrostomy. 2) Diarrhea. 3) Malnutrition.   Clinically he remains stable.  I spoke with radiology and the area of the fluid collection in the uncinate process is too close to the SMA.  Radiology does not feel that it is safe to place a drain in that area.  The fluid collection in this area was noted in February and it is actually smaller.    Plan: 1) Advance to a regular diet. 2) Continue with TPN and then home TPN upon discharge. 3) Continue with antibiotics upon discharge. 4) Hopefully he can be D/C'ed tomorrow if there are no issues with him tolerating PO.  LOS: 4 days   Caleb Chambers D 07/01/2019, 1:24 PM

## 2019-07-02 LAB — CBC
HCT: 26 % — ABNORMAL LOW (ref 39.0–52.0)
Hemoglobin: 7.7 g/dL — ABNORMAL LOW (ref 13.0–17.0)
MCH: 25.1 pg — ABNORMAL LOW (ref 26.0–34.0)
MCHC: 29.6 g/dL — ABNORMAL LOW (ref 30.0–36.0)
MCV: 84.7 fL (ref 80.0–100.0)
Platelets: 181 10*3/uL (ref 150–400)
RBC: 3.07 MIL/uL — ABNORMAL LOW (ref 4.22–5.81)
RDW: 17.8 % — ABNORMAL HIGH (ref 11.5–15.5)
WBC: 4.4 10*3/uL (ref 4.0–10.5)
nRBC: 0 % (ref 0.0–0.2)

## 2019-07-02 LAB — GLUCOSE, CAPILLARY
Glucose-Capillary: 158 mg/dL — ABNORMAL HIGH (ref 70–99)
Glucose-Capillary: 161 mg/dL — ABNORMAL HIGH (ref 70–99)
Glucose-Capillary: 168 mg/dL — ABNORMAL HIGH (ref 70–99)
Glucose-Capillary: 173 mg/dL — ABNORMAL HIGH (ref 70–99)
Glucose-Capillary: 186 mg/dL — ABNORMAL HIGH (ref 70–99)
Glucose-Capillary: 188 mg/dL — ABNORMAL HIGH (ref 70–99)
Glucose-Capillary: 207 mg/dL — ABNORMAL HIGH (ref 70–99)

## 2019-07-02 LAB — COMPREHENSIVE METABOLIC PANEL
ALT: 7 U/L (ref 0–44)
AST: 9 U/L — ABNORMAL LOW (ref 15–41)
Albumin: 2 g/dL — ABNORMAL LOW (ref 3.5–5.0)
Alkaline Phosphatase: 62 U/L (ref 38–126)
Anion gap: 8 (ref 5–15)
BUN: 14 mg/dL (ref 6–20)
CO2: 21 mmol/L — ABNORMAL LOW (ref 22–32)
Calcium: 8.1 mg/dL — ABNORMAL LOW (ref 8.9–10.3)
Chloride: 106 mmol/L (ref 98–111)
Creatinine, Ser: 0.6 mg/dL — ABNORMAL LOW (ref 0.61–1.24)
GFR calc Af Amer: >60 mL/min (ref >60)
GFR calc non Af Amer: >60 mL/min (ref >60)
Glucose, Bld: 183 mg/dL — ABNORMAL HIGH (ref 70–99)
Potassium: 3.8 mmol/L (ref 3.5–5.1)
Sodium: 135 mmol/L (ref 135–145)
Total Bilirubin: <0.1 mg/dL — ABNORMAL LOW (ref 0.3–1.2)
Total Protein: 5.7 g/dL — ABNORMAL LOW (ref 6.5–8.1)

## 2019-07-02 LAB — MAGNESIUM: Magnesium: 1.8 mg/dL (ref 1.7–2.4)

## 2019-07-02 LAB — PHOSPHORUS: Phosphorus: 2.8 mg/dL (ref 2.5–4.6)

## 2019-07-02 MED ORDER — HYDROCODONE-ACETAMINOPHEN 5-325 MG PO TABS
1.0000 | ORAL_TABLET | ORAL | Status: DC | PRN
  Administered 2019-07-02 – 2019-07-03 (×3): 2 via ORAL
  Filled 2019-07-02 (×4): qty 2

## 2019-07-02 MED ORDER — MAGNESIUM SULFATE IN D5W 1-5 GM/100ML-% IV SOLN
1.0000 g | Freq: Once | INTRAVENOUS | Status: AC
  Administered 2019-07-02: 1 g via INTRAVENOUS
  Filled 2019-07-02: qty 100

## 2019-07-02 MED ORDER — BLOOD GLUCOSE MONITOR KIT: PACK | 0 refills | Status: AC

## 2019-07-02 MED ORDER — ENSURE ENLIVE PO LIQD
237.0000 mL | Freq: Two times a day (BID) | ORAL | Status: DC
  Administered 2019-07-03 (×2): 237 mL via ORAL

## 2019-07-02 MED ORDER — HYDROMORPHONE HCL 1 MG/ML IJ SOLN
1.0000 mg | INTRAMUSCULAR | Status: DC | PRN
  Administered 2019-07-02 – 2019-07-08 (×12): 1 mg via INTRAVENOUS
  Filled 2019-07-02 (×14): qty 1

## 2019-07-02 MED ORDER — TRACE MINERALS CU-MN-SE-ZN 300-55-60-3000 MCG/ML IV SOLN
INTRAVENOUS | Status: AC
  Filled 2019-07-02: qty 949.17

## 2019-07-02 NOTE — Progress Notes (Signed)
Subjective: Some SOB with exertion and still with abdominal discomfort.  Objective: Vital signs in last 24 hours: Temp:  [97.4 F (36.3 C)-98.7 F (37.1 C)] 98.7 F (37.1 C) (03/18 1950) Pulse Rate:  [80-90] 80 (03/18 1950) Resp:  [15-17] 17 (03/18 1950) BP: (125-138)/(78-87) 133/87 (03/18 1950) SpO2:  [99 %-100 %] 100 % (03/18 1950) Weight:  [129.2 kg] 129.2 kg (03/18 0500) Last BM Date: 06/26/19  Intake/Output from previous day: 03/17 0701 - 03/18 0700 In: 2393.8 [P.O.:480; I.V.:1477.5; IV Piggyback:436.3] Out: 1500 [Urine:1500] Intake/Output this shift: No intake/output data recorded.  General appearance: alert and no distress Resp: clear to auscultation bilaterally Cardio: regular rate and rhythm GI: some mild tenderness with palpation Extremities: extremities normal, atraumatic, no cyanosis or edema  Lab Results: Recent Labs    06/30/19 0251 07/02/19 0328  WBC 4.9 4.4  HGB 7.7* 7.7*  HCT 25.1* 26.0*  PLT 211 181   BMET Recent Labs    06/30/19 0251 07/01/19 0438 07/02/19 0328  NA 138 136 135  K 3.5 3.6 3.8  CL 108 107 106  CO2 23 20* 21*  GLUCOSE 161* 192* 183*  BUN 12 16 14   CREATININE 0.59* 0.56* 0.60*  CALCIUM 8.0* 8.0* 8.1*   LFT Recent Labs    07/02/19 0328  PROT 5.7*  ALBUMIN 2.0*  AST 9*  ALT 7  ALKPHOS 62  BILITOT <0.1*   PT/INR No results for input(s): LABPROT, INR in the last 72 hours. Hepatitis Panel No results for input(s): HEPBSAG, HCVAB, HEPAIGM, HEPBIGM in the last 72 hours. C-Diff No results for input(s): CDIFFTOX in the last 72 hours. Fecal Lactopherrin No results for input(s): FECLLACTOFRN in the last 72 hours.  Studies/Results: No results found.  Medications:  Scheduled: . allopurinol  300 mg Oral Daily  . Chlorhexidine Gluconate Cloth  6 each Topical Daily  . dicyclomine  20 mg Oral TID AC & HS  . enoxaparin (LOVENOX) injection  0.5 mg/kg Subcutaneous Q24H  . [START ON 07/03/2019] feeding supplement (ENSURE  ENLIVE)  237 mL Oral BID BM  . insulin aspart  0-15 Units Subcutaneous Q4H  . lipase/protease/amylase  36,000 Units Oral TID AC  . pantoprazole  40 mg Oral BID  . sodium chloride flush  10-40 mL Intracatheter Q12H  . sodium chloride flush  10-40 mL Intracatheter Q12H   Continuous: . ceFEPime (MAXIPIME) IV Stopped (07/02/19 1612)  . metronidazole 100 mL/hr at 07/02/19 1800  . TPN CYCLIC-ADULT (ION) 87 mL/hr at 07/02/19 1800    Assessment/Plan: 1) S/p necrosectomy. 2) ABD pain. 3) Malnutrition.   Clinically he is stable.  He continues to have abdominal pain and PO intake did exacerbate some of his symptoms.  He complains about SOB, but his lungs were clear.  This may be as a result of deconditioning.  Plan: 1) Okay with decreasing diet to low fat. 2) Continue with TPN. 3) Encouraged ambulation.  LOS: 5 days   Caleb Chambers 07/02/2019, 8:01 PM

## 2019-07-02 NOTE — Progress Notes (Signed)
PHARMACY - TOTAL PARENTERAL NUTRITION CONSULT NOTE  Indication:  Infected pancreatic pseudocyst  Patient Measurements: Height: 5\' 11"  (180.3 cm) Weight: 276 lb 3.8 oz (125.3 kg) IBW/kg (Calculated) : 75.3 TPN AdjBW (KG): 88.5 Body mass index is 38.53 kg/m.  Assessment:  53 YOM on chronic TPN presenting 06/27/19 with N/V/D and 10/10 abdominal pain. PMH significant for uncontrolled DM2, gout, HTN, pancreatitis and history of noncompliance. Patient recently discharged 06/22/19 after admission for DKA, AKI, and acute pancreatitis with infected pseudocyst, s/p multiple EGD, necrosectomy procedures with cyst gastrostomy. Patient was discharged on doxy and metronidazole. TPN was reduced during previous hospitalization to 1/2 dose on discharge to supplement PO diet. Antibiotics have been broadened and GI consulted. Patient reports no missed days of TPN or toleration issues PTA, last 3/13.  Glucose / Insulin: A1c 13.2 > 8.7%. CBGs up 167-207 as po diet has increased. Required 57 units of insulin in TPN + 20 units SSI.  Electrolytes: CO2 21, Mag 1.8-will replace. Others within normal limits.  Renal: SCr and BUN WNL LFTs / TGs: LFTs / tbili / TG WNL Prealbumin / albumin: pre-albumin 13.5, albumin 2 Intake / Output; MIVF: UOP 0.5 ml/kg/hr, net +7.2L.  Diarrhea improving per patient. GI Imaging: 3/13 CT abd/pelvis: residual persistent collections Surgeries / Procedures: IR to drain uncinate fluid collection if able  Central access: PICC placed 06/05/19 TPN start date: chronic TPN PTA; 3/14 inpatient  Current TPN Rx [facility]): -15% Amino Acid 71 g/day, Dextrose 154 g/day, Lipids 36 g/day - provides 1168 kcal/day -Electrolytes:  Ca gluconate 5 meq, Mag sulfate 19.2 meq, Potassium Acetate 0 meq, Potassium Cl 5 meq, K Phosphate 10 mmol, Sodium Acetate 0 meq, Sodium Cl 77 meq, Sodium Phosphate 0 mmol -Additives:  Trace elements 1 mL, MVI 10 mL, Regular insulin 45 units -Administer over 12 hrs with 1 hr ramp up  and 1 hr ramp down  Nutritional Goals (per RD rec on 3/16): 2350-2550 kCal, 140-155g AA, >2.3L fluid per day  Current Nutrition:  TPN Clear liquids >> Regular diet - consuming 0-75% of meals  Plan:  Continue concentrated cyclic TPN, infuse 0623 mls over 12 hrs: 87 mL/hr x 1 hr, then 175 mL/hr x 10 hrs, then 87 mL/hr x 1 hr.  TPN provides 142g AA, 327g CHO, 72g ILE for total 2404 kCal, meeting 100% of patient needs Electrolytes in TPN: 50 mEq/L Na, continue increased K slightly at 79mEq/L, 2.41mEq/L Ca, 15 mEq/L Mag, and 5 mmol/L Phos. Adjust Cl:Ac to 1:2 Daily trace element and folate in TPN, MWF multivitamin d/t national shortage  Continue mSSI Q4H + increase regular insulin in TPN to 60 units (off Lantus 3/15) F/U AM labs, I/O's, CBGs Follow-up discharge plan - possible tomorrow per patient.   *Patient reports that he does NOT have a glucometer to help measure his CBGs at home. Would strongly recommend that this is set up for patient for discharge. Patient may need a sliding scale meal coverage plan as spikes in CBGs are mostly after he has po intake. Would recommend AVOIDING long acting insulin as this would be dangerous when patient is in the off hours of TPN and does not taking in anything orally. Consider diabetic coordinator involvement to help establish sliding scale meal coverage plan for home use.   Consider weaning TPN as patient intake improves -- currently oral intake is erratic however, patient does state that he feels full from volume after TPN so he is not  desiring breakfast. Decreasing TPN even if down  to 75% may help improve this "full" feeling and help to improve oral intake.   Link Snuffer, PharmD, BCPS, BCCCP Clinical Pharmacist Clinical phone 07/02/2019 until 3PM [phone] Please refer to [facility] for [facility] numbers 07/02/2019, 8:18 AM

## 2019-07-02 NOTE — Progress Notes (Signed)
PROGRESS NOTE    Caleb Chambers  UYQ:[id number] DOB: 15-Jul-1965 DOA: 06/27/2019 PCP: Patient, No Pcp Per    Brief Narrative:  Caleb Chambers  Is a 54 year old male with past medical history remarkable for type 2 diabetes mellitus, essential hypertension, morbid obesity, pancreatitis with pseudocyst, gout, and history of noncompliance who presented with intractable nausea/vomiting and associated abdominal pain.  He called his gastroenterology office, who advised him to proceed to the ED for further evaluation.  Recently hospitalized and discharged on 06/22/2019 for DKA, AKI, and acute pancreatitis with pseudocyst.  During the hospitalization he underwent multiple EGDs with cysto gastrostomy tube creation and double pigtail replacements.  Patient also underwent necrosectomy for his acute pancreatitis with necrosis and infected pseudocyst with Staph aureus.  He was initially placed on vancomycin and Flagyl and transition to doxycycline and metronidazole at discharge.  Plan was to continue antibiotics for 2 weeks until repeat CT abdomen /pelvis was performed.  Patient was also on TPN which was reduced to half dose at time of discharge.  In the ED had basic blood work done as well as a CT of the abdomen and pelvis. He was given a liter of normal saline and given pain control with hydromorphone and antiemetics with promethazine. Patient was started the patient on antibiotics with vancomycin, ceftriaxone and Flagyl and ordered blood cultures. I asked the EDP to [place] and she is going to call the gastroenterologist on call for further evaluation.  Assessment & Plan:   Active Problems:   Morbid obesity (HCC)   Abdominal pain, epigastric   Abnormal CT of the abdomen   Protein-calorie malnutrition, severe   Pancreatic necrosis   Infected pancreatic pseudocyst   Intractable nausea and vomiting   Abdominal pain   Diabetes mellitus type 2 in obese (HCC)   Malnutrition of moderate degree   Acute  on chronic pancreatitis with necrosis Infected pseudocyst with MSSA Patient presenting with recurrent nausea/vomiting and abdominal pain. CT abd pelvis notable for two cystgastrostomy catheters decompressiing previous large pancreatic pseudocyst and two small heterogeneous collections persisting not drained by catheters; one adjacent to pancreatic tail and one adjacent to uncinate process. --GI Dr. Benson Norway Following; appreciate assistance --GI discussed with radiology; unable to place drain surrounding uncinate process given proximity to SMA --Continue antibiotics with Cefepime and Flagyl --Continue TPN --Diet was advanced to regular diet, but patient had worsening pain after attempting to eat --will change to low fat diet --he continues to require IV pain medications; will attempt to transition to po pain meds --continue creon  Type 2 diabetes mellitus, poorly controlled Hemoglobin A1c 8.7 06/27/2019; improved from 13.2 04/22/2019.  --continue insulin in TPN --Continue ISS for further coverage -blood sugars are stable  Gout: continue allopurinol 331m PO daily  Pseudohyponatremia Etiology etiology likely secondary to hyperglycemia, now resolved with improved glycemic control.   Moderate protein calorie malnutrition Prealbumin 13.5. --Nutrition following, appreciate assistance --continue full-dose TPN --boost Breeze twice daily  Nutrition Status: Nutrition Problem: Moderate Malnutrition Etiology: chronic illness(pancreatitis) Signs/Symptoms: percent weight loss, energy intake < or equal to 75% for > or equal to 1 month, mild fat depletion, moderate fat depletion, mild muscle depletion, moderate muscle depletion Percent weight loss: 13.7 % Interventions: Boost Breeze, TPN  Hypokalemia Repleted during hospitalization.  Potassium 3.6 today. --Continue supplementation per nutrition/TPN --BMP daily  GERD: continue PPI  Obesity Body mass index is 39.73 kg/m. discussed need for  lifestyle changes /weight reduction as this complicates all facets of care  Normocytic anemia  Hemoglobin stable, 7.7  Anxiety: continue xanax prn   DVT prophylaxis: Lovenox Code Status: Full Code Family Communication: Updated patient at bedside  Disposition Plan:      Patient is from: Home     Anticipated Disposition: Home with Home health RN, TPN     Barriers to discharge or conditions that needs to be met prior to discharge: improvement of abdominal pain. He is still requiring IV narcotics. Will attempt to transition to po.  Consultants:   GI - Dr. Benson Norway  Procedures:   none  Antimicrobials:   Cefepime  Flagyl   Subjective: Reports worsening pain after eating a regular diet today. No vomiting.   Objective: Vitals:   07/02/19 0417 07/02/19 0500 07/02/19 0824 07/02/19 1534  BP: 125/82  138/83 134/78  Pulse: 86  90 84  Resp: 15   16  Temp: 97.9 F (36.6 C)  (!) 97.4 F (36.3 C) 98.4 F (36.9 C)  TempSrc:   Oral Oral  SpO2: 99%  100% 100%  Weight:  129.2 kg    Height:        Intake/Output Summary (Last 24 hours) at 07/02/2019 1828 Last data filed at 07/02/2019 1800 Gross per 24 hour  Intake 3718 ml  Output 800 ml  Net 2918 ml   Filed Weights   06/27/19 1306 06/28/19 0457 07/02/19 0500  Weight: 128 kg 125.3 kg 129.2 kg    Examination:  General exam: Alert, awake, oriented x 3 Respiratory system: Clear to auscultation. Respiratory effort normal. Cardiovascular system:RRR. No murmurs, rubs, gallops. Gastrointestinal system: Abdomen is nondistended, soft and tender in epigastrium, left upper and left lower quadrants. No organomegaly or masses felt. Normal bowel sounds heard. Central nervous system: Alert and oriented. No focal neurological deficits. Extremities: No C/C/E, +pedal pulses Skin: No rashes, lesions or ulcers Psychiatry: Judgement and insight appear normal. Mood & affect appropriate.      Data Reviewed: I have personally reviewed following  labs and imaging studies  CBC: Recent Labs  Lab 06/27/19 1316 06/27/19 1316 06/27/19 1546 06/27/19 2326 06/29/19 0311 06/30/19 0251 07/02/19 0328  WBC 9.3  --   --  5.3 5.2 4.9 4.4  NEUTROABS 8.3*  --   --  3.2 3.5 2.9  --   HGB 9.1*   < > 9.9* 7.8* 7.7* 7.7* 7.7*  HCT 31.4*   < > 29.0* 25.3* 25.5* 25.1* 26.0*  MCV 87.0  --   --  81.9 84.7 84.2 84.7  PLT 230  --   --  225 218 211 181   < > = values in this interval not displayed.   Basic Metabolic Panel: Recent Labs  Lab 06/27/19 2326 06/29/19 0311 06/30/19 0251 07/01/19 0438 07/02/19 0328  NA 135 138 138 136 135  K 3.7 2.8* 3.5 3.6 3.8  CL 103 108 108 107 106  CO2 _0 20* 21*  GLUCOSE 260* 86 161* 192* 183*  BUN _1 CREATININE 0.62 0.63 0.59* 0.56* 0.60*  CALCIUM 8.6* 8.4* 8.0* 8.0* 8.1*  MG 1.7 1.7 2.0 1.9 1.8  PHOS 3.7 4.0 3.9  --  2.8   GFR: Estimated Creatinine Clearance: 148 mL/min (A) (by C-G formula based on SCr of 0.6 mg/dL (L)). Liver Function Tests: Recent Labs  Lab 06/27/19 1316 06/27/19 2326 06/29/19 0311 06/30/19 0251 07/02/19 0328  AST 11* 11* 11* 11* 9*  ALT _2 ALKPHOS 99 87 78 70 62  BILITOT 0.5 0.3  0.5 0.4 <0.1*  PROT 7.4 6.8 6.4* 6.0* 5.7*  ALBUMIN 2.6* 2.3* 2.1* 1.9* 2.0*   Recent Labs  Lab 06/27/19 1316  LIPASE 30   No results for input(s): AMMONIA in the last 168 hours. Coagulation Profile: No results for input(s): INR, PROTIME in the last 168 hours. Cardiac Enzymes: No results for input(s): CKTOTAL, CKMB, CKMBINDEX, TROPONINI in the last 168 hours. BNP (last 3 results) No results for input(s): PROBNP in the last 8760 hours. HbA1C: No results for input(s): HGBA1C in the last 72 hours. CBG: Recent Labs  Lab 07/02/19 0006 07/02/19 0418 07/02/19 0821 07/02/19 1141 07/02/19 1649  GLUCAP 186* 207* 173* 168* 158*   Lipid Profile: No results for input(s): CHOL, HDL, LDLCALC, TRIG, CHOLHDL, LDLDIRECT in the last 72 hours. Thyroid Function Tests: No  results for input(s): TSH, T4TOTAL, FREET4, T3FREE, THYROIDAB in the last 72 hours. Anemia Panel: No results for input(s): VITAMINB12, FOLATE, FERRITIN, TIBC, IRON, RETICCTPCT in the last 72 hours. Sepsis Labs: Recent Labs  Lab 06/27/19 1523  LATICACIDVEN 1.2    Recent Results (from the past 240 hour(s))  SARS CORONAVIRUS 2 (TAT 6-24 HRS) Nasopharyngeal Nasopharyngeal Swab     Status: None   Collection Time: 06/27/19  5:27 PM   Specimen: Nasopharyngeal Swab  Result Value Ref Range Status   SARS Coronavirus 2 NEGATIVE NEGATIVE Final    Comment: (NOTE) SARS-CoV-2 target nucleic acids are NOT DETECTED. The SARS-CoV-2 RNA is generally detectable in upper and lower respiratory specimens during the acute phase of infection. Negative results do not preclude SARS-CoV-2 infection, do not rule out co-infections with other pathogens, and should not be used as the sole basis for treatment or other patient management decisions. Negative results must be combined with clinical observations, patient history, and epidemiological information. The expected result is Negative. Fact Sheet for Patients: SugarRoll.be Fact Sheet for Healthcare Providers: https://www.woods-mathews.com/ This test is not yet approved or cleared by the [place] FDA and  has been authorized for detection and/or diagnosis of SARS-CoV-2 by FDA under an Emergency Use Authorization (EUA). This EUA will remain  in effect (meaning this test can be used) for the duration of the COVID-19 declaration under Section 56 4(b)(1) of the Act, 21 U.S.C. section 360bbb-3(b)(1), unless the authorization is terminated or revoked sooner. Performed at [facility], [place] [address], [place], [place] [address]   Culture, blood (routine x 2)     Status: None (Preliminary result)   Collection Time: 06/27/19 11:35 PM   Specimen: BLOOD  Result Value Ref Range Status   Specimen Description BLOOD  RIGHT HAND  Final   Special Requests   Final    BOTTLES DRAWN AEROBIC AND ANAEROBIC Blood Culture adequate volume   Culture   Final    NO GROWTH 4 DAYS Performed at [facility], [place] [address], [place],  [address]    Report Status PENDING  Incomplete  Culture, blood (routine x 2)     Status: None (Preliminary result)   Collection Time: 06/27/19 11:37 PM   Specimen: BLOOD  Result Value Ref Range Status   Specimen Description BLOOD RIGHT FOREARM  Final   Special Requests   Final    BOTTLES DRAWN AEROBIC AND ANAEROBIC Blood Culture adequate volume   Culture   Final    NO GROWTH 4 DAYS Performed at [facility], [place] [address] [place],  [address]    Report Status PENDING  Incomplete  Urine culture  Status: None   Collection Time: 06/28/19  5:12 AM   Specimen: Urine, Clean Catch  Result Value Ref Range Status   Specimen Description URINE, CLEAN CATCH  Final   Special Requests NONE  Final   Culture   Final    NO GROWTH Performed at [facility], [address], [place], [place] [address]    Report Status 06/29/2019 FINAL  Final  GI pathogen panel by PCR, stool     Status: None   Collection Time: 06/29/19 12:38 PM   Specimen: Stool  Result Value Ref Range Status   Plesiomonas shigelloides NOT DETECTED NOT DETECTED Final   Yersinia enterocolitica NOT DETECTED NOT DETECTED Final   Vibrio NOT DETECTED NOT DETECTED Final   Enteropathogenic E coli NOT DETECTED NOT DETECTED Final   E coli (ETEC) LT/ST NOT DETECTED NOT DETECTED Final   E coli 6045 by PCR Not applicable NOT DETECTED Final   Cryptosporidium by PCR NOT DETECTED NOT DETECTED Final   Entamoeba histolytica NOT DETECTED NOT DETECTED Final   Adenovirus F 40/41 NOT DETECTED NOT DETECTED Final   Norovirus GI/GII NOT DETECTED NOT DETECTED Final   Sapovirus NOT DETECTED NOT DETECTED Final    Comment: (NOTE) Performed At: [facility] [place], [place]  [id number] Rush Farmer MD NW:[id number]    Vibrio cholerae NOT DETECTED NOT DETECTED Final   Campylobacter by PCR NOT DETECTED NOT DETECTED Final   Salmonella by PCR NOT DETECTED NOT DETECTED Final   E coli (STEC) NOT DETECTED NOT DETECTED Final   Enteroaggregative E coli NOT DETECTED NOT DETECTED Final   Shigella by PCR NOT DETECTED NOT DETECTED Final   Cyclospora cayetanensis NOT DETECTED NOT DETECTED Final   Astrovirus NOT DETECTED NOT DETECTED Final   G lamblia by PCR NOT DETECTED NOT DETECTED Final   Rotavirus A by PCR NOT DETECTED NOT DETECTED Final  C difficile quick scan w PCR reflex     Status: None   Collection Time: 06/29/19 12:38 PM   Specimen: Stool  Result Value Ref Range Status   C Diff antigen NEGATIVE NEGATIVE Final   C Diff toxin NEGATIVE NEGATIVE Final   C Diff interpretation No C. difficile detected.  Final    Comment: Performed at [facility], [place] [address] [place], [place] [address]         Radiology Studies: No results found.      Scheduled Meds: . allopurinol  300 mg Oral Daily  . Chlorhexidine Gluconate Cloth  6 each Topical Daily  . dicyclomine  20 mg Oral TID AC & HS  . enoxaparin (LOVENOX) injection  0.5 mg/kg Subcutaneous Q24H  . [START ON 07/03/2019] feeding supplement (ENSURE ENLIVE)  237 mL Oral BID BM  . insulin aspart  0-15 Units Subcutaneous Q4H  . lipase/protease/amylase  36,000 Units Oral TID AC  . pantoprazole  40 mg Oral BID  . sodium chloride flush  10-40 mL Intracatheter Q12H  . sodium chloride flush  10-40 mL Intracatheter Q12H   Continuous Infusions: . ceFEPime (MAXIPIME) IV Stopped (07/02/19 1612)  . metronidazole 100 mL/hr at 07/02/19 1800  . TPN CYCLIC-ADULT (ION) 87 mL/hr at 07/02/19 1800     LOS: 5 days    Time spent: 21mns    JKathie Dike MD [facility] Available via Epic secure chat 7am-7pm After these hours, please refer to coverage provider listed on [email or web address] 07/02/2019, 6:28 PM

## 2019-07-02 NOTE — Plan of Care (Signed)

## 2019-07-02 NOTE — Plan of Care (Signed)

## 2019-07-02 NOTE — Plan of Care (Signed)
  Problem: Education: Goal: Knowledge of General Education information will improve Description Including pain rating scale, medication(s)/side effects and non-pharmacologic comfort measures Outcome: Progressing   Problem: Nutrition: Goal: Adequate nutrition will be maintained Outcome: Progressing   Problem: Pain Managment: Goal: General experience of comfort will improve Outcome: Progressing   

## 2019-07-02 NOTE — TOC Initial Note (Addendum)
Transition of Care [facility]) - Initial/Assessment Note    Patient Details  Name: CODEN FRANCHI MRN: [id number] Date of Birth: 04-Jan-1966  Transition of Care [facility]) CM/SW Contact:    Curlene Labrum, RN Phone Number: 07/02/2019, 12:00 PM  Clinical Narrative:                 PATTRICK BADY  Is a 54 year old male with past medical history remarkable for type 2 diabetes mellitus, essential hypertension, morbid obesity, pancreatitis with pseudocyst, gout, and history of noncompliance who presented with intractable nausea/vomiting and associated abdominal pain. Patient receiving IV home infusions for TPN and now added Iv antibiotics.  Carolynn Sayers, [facility] IV in to see patient and she states that she has already consulted [facility] to increase [facility] RN visits to [place], [place].  Patient's Primary care physician is Dr. Precious Haws.  His pharmacy is [facility] [employer] [employer] and [place].  Patient requesting Glucometer for home.  Called the hospitalist and glucometer script sent electronically to his pharmacy.  Patient currently has no needs for DME since he has a rolling walker and 3:1 at home.  No other barriers to discharge noted at this time.  Expected Discharge Plan: [place] Barriers to Discharge: Barriers Resolved   Patient Goals and CMS Choice Patient states their goals for this hospitalization and ongoing recovery are:: I'm feeling a little better, my abdomen hurts at times - I'm getting that under control and I should be going home tomorrow. CMS Medicare.gov Compare Post Acute Care list provided to:: [name] is active with the patient along with [facility] infusion Carolynn Sayers))    Expected Discharge Plan and Services Expected Discharge Plan: [place]   Discharge Planning Services: CM Consult   Living arrangements for the past 2 months: Single Family Home                 DME Arranged: Glucometer(Hospitalist called in Glucometer this morning to pharmacy - electronic  script)           [place] Agency: [place] infusion - Carolynn Sayers) Date [place]: 07/02/19([name] at bedside) Time [place]: [place] Representative spoke with at [place]: Carolynn Sayers, [facility] infusion will arrange for [facility] to increase visits to [place] for added antibiotic/TPN infusion continues  Prior Living Arrangements/Services Living arrangements for the past 2 months: [place] Lives with:: Self(brother helps patient and lives in [place]) Patient language and need for interpreter reviewed:: Yes Do you feel safe going back to the place where you live?: Yes      Need for Family Participation in Patient Care: Yes (Comment) Care giver support system in place?: Yes (comment) Current home services: DME, Home RN, Other (comment)([facility] IV infusion) Criminal Activity/Legal Involvement Pertinent to Current Situation/Hospitalization: No - Comment as needed  Activities of Daily Living Home Assistive Devices/Equipment: Walker (specify type) ADL Screening (condition at time of admission) Patient's cognitive ability adequate to safely complete daily activities?: Yes Is the patient deaf or have difficulty hearing?: No Does the patient have difficulty seeing, even when wearing glasses/contacts?: No Does the patient have difficulty concentrating, remembering, or making decisions?: No Patient able to express need for assistance with ADLs?: Yes Does the patient have difficulty dressing or bathing?: No Independently performs ADLs?: Yes (appropriate for developmental age) Does the patient have difficulty walking or climbing stairs?: Yes Weakness of Legs: Both Weakness of Arms/Hands: None  Permission Sought/Granted Permission sought to  share information with : Case Manager Permission granted to share information with : Yes, Verbal Permission Granted        Permission granted to share info w Relationship: [facility] infusion / [facility] RN      Emotional Assessment Appearance:: Appears stated age Attitude/Demeanor/Rapport: Engaged Affect (typically observed): Calm Orientation: : Oriented to Self, Oriented to Place, Oriented to  Time, Oriented to Situation Alcohol / Substance Use: Not Applicable Psych Involvement: No (comment)  Admission diagnosis:  Dehydration [E86.0] Generalized abdominal pain [R10.84] Nausea vomiting and diarrhea [R11.2, R19.7] Intractable nausea and vomiting [R11.2] Abdominal pain [R10.9] Patient Active Problem List   Diagnosis Date Noted  . Malnutrition of moderate degree 06/30/2019  . Intractable nausea and vomiting 06/27/2019  . Abdominal pain 06/27/2019  . Diabetes mellitus type 2 in obese (HCC) 06/27/2019  . Infected pancreatic pseudocyst 06/11/2019  . Pancreatic necrosis   . Protein-calorie malnutrition, severe 05/13/2019  . DKA (diabetic ketoacidosis) (HCC) 05/12/2019  . Debility 05/12/2019  . Morbid obesity (HCC)   . Abdominal pain, epigastric   . Abnormal CT of the abdomen   . Acute pancreatitis 04/20/2019  . DKA (diabetic ketoacidoses) (HCC) 04/20/2019  . Acute kidney injury [facility])    PCP:  Patient, No Pcp Per Pharmacy:   [id number] [place], [place] - [phone] AT [facility] OF [facility] & [address] [place] [address] Phone: [phone] Fax: [phone]  [facility] [place], [place] - [address] [place] [address] Phone: [phone] Fax: [phone]     Social Determinants of Health (SDOH) Interventions    Readmission Risk Interventions Readmission Risk Prevention Plan 07/02/2019 06/09/2019  Transportation Screening Complete Complete  PCP or Specialist Appt within 3-5 Days Complete -  HRI or Home Care Consult Complete -  Social Work Consult for Recovery Care Planning/Counseling Complete -  Palliative Care Screening Complete -  Medication Review Furniture conservator/restorer) Complete Complete  SW Recovery Care/Counseling Consult - Complete  Palliative Care Screening - Not Applicable  Skilled Nursing Facility - Patient Refused  Some recent data might be hidden

## 2019-07-02 NOTE — Progress Notes (Signed)
Nutrition Follow-up  DOCUMENTATION CODES:   Non-severe (moderate) malnutrition in context of chronic illness  INTERVENTION:   -D/c Boost Breeze po BID, each supplement provides 250 kcal and 9 grams of protein -TPN management per pharmacy -Ensure Enlive po BID, each supplement provides 350 kcal and 20 grams of protein -Provided "Pancreatitis Nutrition Therapy" handout from [facility] Nutrition Care Manual; attached to AVS/ discharge instructions  NUTRITION DIAGNOSIS:   Moderate Malnutrition related to chronic illness(pancreatitis) as evidenced by percent weight loss, energy intake < or equal to 75% for > or equal to 1 month, mild fat depletion, moderate fat depletion, mild muscle depletion, moderate muscle depletion.  Ongoing  GOAL:   Patient will meet greater than or equal to 90% of their needs  Met with TPN  MONITOR:   PO intake, Supplement acceptance, Diet advancement, Labs, Weight trends, Skin, I & O's  REASON FOR ASSESSMENT:   Consult New TPN/TNA  ASSESSMENT:   Caleb Chambers is a 54 y.o. male with medical history significant for but not limited history of uncontrolled diabetes mellitus type 2 with a history of DKA, gout, hypertension, morbid obesity, pancreatitis, history of noncompliance who was recently just discharged from the hospital on 06/22/2018 where is found and admitted for diabetic ketoacidosis, AKI and acute pancreatitis with pseudocyst.  During that hospitalization he underwent multiple EGDs with cystogastrostomy tube creation and double-pigtail replacements.  He underwent multiple EGDs as well as necrosectomy treated for his acute pancreatitis with necrosis and infected pseudocyst with staph aureus.  He was placed on vancomycin and Flagyl and transition to doxycycline metronidazole at discharge.  Plan was for him to new antibiotics until repeat CT of the abdomen and pelvis in 2 weeks.  Patient was on TPN during that hospitalization and it was reduced to half the dose.   He now presents again diarrhea, nausea, vomiting and 10 out of 10 abdominal pain which was initially dull and stabbing and now is just throbbing.  Patient states that he woke up with diarrhea around 2 AM and had multiple episodes.  Tried to maintain his fluid balance and started vomiting and started having abdominal pain.  Was unable to keep anything down.  He called the gastroenterology office and he was advised to come to the ED for further evaluation recommendations.  In the ED he had a CT of the abdomen pelvis done and he had some abdominal pain that was radiating to the left.  Repeat CT of the abdomen pelvis was done and showed that there is still some residual persistent collections that were definitely not drained by the catheters adjacent to the uncinate process.  Patient clinically is not septic appearing but is complaining of significant abdominal pain.  Because of his uncontrolled symptoms he will be brought into the hospital for further evaluation and will be consulted for further recommendations.  Reviewed I/O's: +894 ml x 24 hours and +7.2 L since admission  UOP: 1.5 L x 24 hours  Spoke with pt at bedside, who was pleasant and in good spirits today. He reports he is happy to be eating solid foods, but continues to eat very little (noted breakfast tray pretty much unattempted). Pt shares that he often feels full, especially after TPN has stopped running. He also continues to report early satiety. Discussed with pt consuming small, frequent meals and encouraged a low fat, low fiber diet to assist with pancreatitis and food tolerance. Also discussed diet recommendations with pt's sister, Maudie Mercury, via speakerphone.   Pt remains on  TPN0- receiving concentrated cyclic TPN, infuse 7998 mls over 12 hrs: 87 mL/hr x 1 hr, then 175 mL/hr x 10 hrs, then 87 mL/hr x 1 hr. Regimen provides 2404 kcals and 142 grams protein, meeting 100% of estimated kcals and protein. Per pharmacy note, may reduce TPN as pt's  intake starts to improve.  Labs reviewed: CBGS: [id number] (inpatient orders for glycemic control are 0-15 units insulin aspart every 4 hours).  Diet Order:   Diet Order            Diet regular Room service appropriate? Yes; Fluid consistency: Thin  Diet effective now              EDUCATION NEEDS:   Education needs have been addressed  Skin:  Skin Assessment: Reviewed RN Assessment  Last BM:  06/29/19  Height:   Ht Readings from Last 1 Encounters:  06/27/19 _0  (1.803 m)    Weight:   Wt Readings from Last 1 Encounters:  07/02/19 129.2 kg    Ideal Body Weight:  78.2 kg  BMI:  Body mass index is 39.73 kg/m.  Estimated Nutritional Needs:   Kcal:  7215-8727  Protein:  140-155 grams  Fluid:  > 2.3 L    Loistine Chance, RD, LDN, [place] Registered Dietitian II Certified Diabetes Care and Education Specialist Please refer to [facility] for RD and/or RD on-call/weekend/after hours pager

## 2019-07-03 LAB — GLUCOSE, CAPILLARY
Glucose-Capillary: 168 mg/dL — ABNORMAL HIGH (ref 70–99)
Glucose-Capillary: 171 mg/dL — ABNORMAL HIGH (ref 70–99)
Glucose-Capillary: 152 mg/dL — ABNORMAL HIGH (ref 70–99)
Glucose-Capillary: 150 mg/dL — ABNORMAL HIGH (ref 70–99)
Glucose-Capillary: 180 mg/dL — ABNORMAL HIGH (ref 70–99)

## 2019-07-03 LAB — BASIC METABOLIC PANEL
Anion gap: 8 (ref 5–15)
BUN: 11 mg/dL (ref 6–20)
CO2: 22 mmol/L (ref 22–32)
Calcium: 8.3 mg/dL — ABNORMAL LOW (ref 8.9–10.3)
Chloride: 106 mmol/L (ref 98–111)
Creatinine, Ser: 0.5 mg/dL — ABNORMAL LOW (ref 0.61–1.24)
GFR calc Af Amer: >60 mL/min (ref >60)
GFR calc non Af Amer: >60 mL/min (ref >60)
Glucose, Bld: 146 mg/dL — ABNORMAL HIGH (ref 70–99)
Potassium: 3.9 mmol/L (ref 3.5–5.1)
Sodium: 136 mmol/L (ref 135–145)

## 2019-07-03 LAB — CULTURE, BLOOD (ROUTINE X 2)
Culture: NO GROWTH
Culture: NO GROWTH
Special Requests: ADEQUATE
Special Requests: ADEQUATE

## 2019-07-03 LAB — MAGNESIUM: Magnesium: 1.7 mg/dL (ref 1.7–2.4)

## 2019-07-03 MED ORDER — TRACE MINERALS CU-MN-SE-ZN 300-55-60-3000 MCG/ML IV SOLN
INTRAVENOUS | Status: AC
  Filled 2019-07-03: qty 710.4

## 2019-07-03 MED ORDER — SUCRALFATE 1 GM/10ML PO SUSP
1.0000 g | Freq: Three times a day (TID) | ORAL | Status: DC
  Administered 2019-07-03: 1 g via ORAL
  Filled 2019-07-03 (×10): qty 10

## 2019-07-03 MED ORDER — MAGNESIUM SULFATE 2 GM/50ML IV SOLN
2.0000 g | Freq: Once | INTRAVENOUS | Status: AC
  Administered 2019-07-03: 2 g via INTRAVENOUS
  Filled 2019-07-03: qty 50

## 2019-07-03 NOTE — Progress Notes (Signed)
Patient received 1120-1278mL of TPN overnight, pt unable to receive full dose d/t temporary occulusion of line.   IV team consulted and assessed PICC line.  IV team unable to restart TPN d/t line being disconnected and the increased risk for infection once reconnected.  Pharmacy and [facility] On Call MD notified of amount of TPN patient received and both teams with follow up this morning.  Will continue to monitor.

## 2019-07-03 NOTE — Progress Notes (Signed)
Spoke with patient about his diabetes. Patient was followed by our team during the last admission. States that the "last time he was in the hospital, he was sent home with 3 days worth of insulin and that was all." With the TPN being run for 12 hours and with insulin in the TPN, recommend adding Novolog 3 units TID with meals by injection (insulin pen) when TPN is off during the day. He will need a prescription for a home blood glucose meter, strips, and lancets at discharge (order # [id number]) so that he can purchase a meter and supplies that are covered by insurance.  Spoke with the pharmacist following his case. Agreed that the patient will need follow up with a PCP, especially when the TPN is stopped and basal insulin will need to be added to his regimen at that time.  Will continue to monitor blood sugars while in the hospital.  Smith Mince RN BSN CDE Diabetes Coordinator Pager: [phone]  8am-5pm

## 2019-07-03 NOTE — Progress Notes (Signed)
PROGRESS NOTE    Caleb Chambers  DHR:[id number] DOB: 1965/11/29 DOA: 06/27/2019 PCP: Patient, No Pcp Per    Brief Narrative:  Caleb Chambers  Is a 54 year old male with past medical history remarkable for type 2 diabetes mellitus, essential hypertension, morbid obesity, pancreatitis with pseudocyst, gout, and history of noncompliance who presented with intractable nausea/vomiting and associated abdominal pain.  He called his gastroenterology office, who advised him to proceed to the ED for further evaluation.  Recently hospitalized and discharged on 06/22/2019 for DKA, AKI, and acute pancreatitis with pseudocyst.  During the hospitalization he underwent multiple EGDs with cysto gastrostomy tube creation and double pigtail replacements.  Patient also underwent necrosectomy for his acute pancreatitis with necrosis and infected pseudocyst with Staph aureus.  He was initially placed on vancomycin and Flagyl and transition to doxycycline and metronidazole at discharge.  Plan was to continue antibiotics for 2 weeks until repeat CT abdomen /pelvis was performed.  Patient was also on TPN which was reduced to half dose at time of discharge.  In the ED had basic blood work done as well as a CT of the abdomen and pelvis. He was given a liter of normal saline and given pain control with hydromorphone and antiemetics with promethazine. Patient was started the patient on antibiotics with vancomycin, ceftriaxone and Flagyl and ordered blood cultures. I asked the EDP to [place] and she is going to call the gastroenterologist on call for further evaluation.  Assessment & Plan:   Active Problems:   Morbid obesity (HCC)   Abdominal pain, epigastric   Abnormal CT of the abdomen   Protein-calorie malnutrition, severe   Pancreatic necrosis   Infected pancreatic pseudocyst   Intractable nausea and vomiting   Abdominal pain   Diabetes mellitus type 2 in obese (HCC)   Malnutrition of moderate degree   Acute  on chronic pancreatitis with necrosis Infected pseudocyst with MSSA Patient presenting with recurrent nausea/vomiting and abdominal pain. CT abd pelvis notable for two cystgastrostomy catheters decompressiing previous large pancreatic pseudocyst and two small heterogeneous collections persisting not drained by catheters; one adjacent to pancreatic tail and one adjacent to uncinate process.  Gastroenterology discussed case with IR, unable to place drain surrounding uncinate process given proximity to SMA. --GI Dr. Benson Norway Following; appreciate assistance --Continue antibiotics with Cefepime and Flagyl --Continue TPN; reduce dose to 75% today for abd pain/fullness; pharmacy following for management --Low-fat diet --continue creon with meals  Type 2 diabetes mellitus, poorly controlled Hemoglobin A1c 8.7 06/27/2019; improved from 13.2 04/22/2019.  --continue insulin in TPN, currently 45u regular insulin; management per pharmacy --Continue ISS for further coverage --Diabetic educator consult, given his complex case with TPN and irregular meal consumption  Gout: continue allopurinol 334m PO daily  Pseudohyponatremia Etiology etiology likely secondary to hyperglycemia, now resolved with improved glycemic control. --monitor BMP daily  Moderate protein calorie malnutrition Prealbumin 13.5. --Nutrition following, appreciate assistance --continue full-dose TPN --boost Breeze twice daily --GI advancing diet today  Nutrition Status: Nutrition Problem: Moderate Malnutrition Etiology: chronic illness(pancreatitis) Signs/Symptoms: percent weight loss, energy intake < or equal to 75% for > or equal to 1 month, mild fat depletion, moderate fat depletion, mild muscle depletion, moderate muscle depletion Percent weight loss: 13.7 % Interventions: Boost Breeze, TPN  Hypokalemia Repleted during hospitalization.  Potassium 3.9 today. --Continue supplementation per nutrition/TPN --BMP  daily  Hypomagnesmia Magnesium 1.7 today, will replete. --Repeat magnesium in a.m.  GERD: continue PPI  Obesity Body mass index is 39.73 kg/m. discussed need  for lifestyle changes /weight reduction as this complicates all facets of care  Normocytic anemia Hemoglobin stable, 7.7  Anxiety: continue xanax prn   DVT prophylaxis: Lovenox Code Status: Full Code Family Communication: Updated patient at bedside  Disposition Plan:      Patient is from: Home     Anticipated Disposition: Home with Home health RN, TPN     Barriers to discharge or conditions that needs to be met prior to discharge: Decreased reliance on IV pain medicine, sign off by gastroenterology, abdominal pain improved with better toleration of oral intake  Consultants:   GI - Dr. Benson Norway  Procedures:   none  Antimicrobials:   Cefepime  Flagyl   Subjective: Patient seen and examined at bedside. Resting comfortably.  Continues with abdominal discomfort, abdominal fullness and nausea.  Feels like he would feel better if he could vomit. No other complaints. GI advancing diet. No overnight concerns per nursing staff.  Objective: Vitals:   07/02/19 1534 07/02/19 1950 07/03/19 0332 07/03/19 0752  BP: 134/78 133/87 (!) 145/81 138/81  Pulse: 84 80 84 92  Resp: _0 Temp: 98.4 F (36.9 C) 98.7 F (37.1 C) 98.5 F (36.9 C) 98.6 F (37 C)  TempSrc: Oral Oral Oral Oral  SpO2: 100% 100% 99% 98%  Weight:      Height:        Intake/Output Summary (Last 24 hours) at 07/03/2019 1230 Last data filed at 07/03/2019 0835 Gross per 24 hour  Intake 3230.73 ml  Output 2350 ml  Net 880.73 ml   Filed Weights   06/27/19 1306 06/28/19 0457 07/02/19 0500  Weight: 128 kg 125.3 kg 129.2 kg    Examination:  General exam: Appears calm and comfortable  Respiratory system: Clear to auscultation. Respiratory effort normal. Cardiovascular system: S1 & S2 heard, RRR. No JVD, murmurs, rubs, gallops or clicks. No  pedal edema. Gastrointestinal system: Abdomen is nondistended, soft and mild generalized tenderness. No organomegaly or masses felt. Normal bowel sounds heard. Central nervous system: Alert and oriented. No focal neurological deficits. Extremities: Symmetric 5 x 5 power. Skin: No rashes, lesions or ulcers Psychiatry: Judgement and insight appear normal. Mood & affect appropriate.     Data Reviewed: I have personally reviewed following labs and imaging studies  CBC: Recent Labs  Lab 06/27/19 1316 06/27/19 1316 06/27/19 1546 06/27/19 2326 06/29/19 0311 06/30/19 0251 07/02/19 0328  WBC 9.3  --   --  5.3 5.2 4.9 4.4  NEUTROABS 8.3*  --   --  3.2 3.5 2.9  --   HGB 9.1*   < > 9.9* 7.8* 7.7* 7.7* 7.7*  HCT 31.4*   < > 29.0* 25.3* 25.5* 25.1* 26.0*  MCV 87.0  --   --  81.9 84.7 84.2 84.7  PLT 230  --   --  225 218 211 181   < > = values in this interval not displayed.   Basic Metabolic Panel: Recent Labs  Lab 06/27/19 2326 06/27/19 2326 06/29/19 0311 06/30/19 0251 07/01/19 0438 07/02/19 0328 07/03/19 0452  NA 135   < > 138 138 136 135 136  K 3.7   < > 2.8* 3.5 3.6 3.8 3.9  CL 103   < > 108 108 107 106 106  CO2 22   < > 23 23 20* 21* 22  GLUCOSE 260*   < > 86 161* 192* 183* 146*  BUN 8   < > _1 CREATININE 0.62   < >  0.63 0.59* 0.56* 0.60* 0.50*  CALCIUM 8.6*   < > 8.4* 8.0* 8.0* 8.1* 8.3*  MG 1.7   < > 1.7 2.0 1.9 1.8 1.7  PHOS 3.7  --  4.0 3.9  --  2.8  --    < > = values in this interval not displayed.   GFR: Estimated Creatinine Clearance: 148 mL/min (A) (by C-G formula based on SCr of 0.5 mg/dL (L)). Liver Function Tests: Recent Labs  Lab 06/27/19 1316 06/27/19 2326 06/29/19 0311 06/30/19 0251 07/02/19 0328  AST 11* 11* 11* 11* 9*  ALT _0 ALKPHOS 99 87 78 70 62  BILITOT 0.5 0.3 0.5 0.4 <0.1*  PROT 7.4 6.8 6.4* 6.0* 5.7*  ALBUMIN 2.6* 2.3* 2.1* 1.9* 2.0*   Recent Labs  Lab 06/27/19 1316  LIPASE 30   No results for input(s):  AMMONIA in the last 168 hours. Coagulation Profile: No results for input(s): INR, PROTIME in the last 168 hours. Cardiac Enzymes: No results for input(s): CKTOTAL, CKMB, CKMBINDEX, TROPONINI in the last 168 hours. BNP (last 3 results) No results for input(s): PROBNP in the last 8760 hours. HbA1C: No results for input(s): HGBA1C in the last 72 hours. CBG: Recent Labs  Lab 07/02/19 1952 07/02/19 2357 07/03/19 0333 07/03/19 0810 07/03/19 1133  GLUCAP 161* 188* 168* 171* 152*   Lipid Profile: No results for input(s): CHOL, HDL, LDLCALC, TRIG, CHOLHDL, LDLDIRECT in the last 72 hours. Thyroid Function Tests: No results for input(s): TSH, T4TOTAL, FREET4, T3FREE, THYROIDAB in the last 72 hours. Anemia Panel: No results for input(s): VITAMINB12, FOLATE, FERRITIN, TIBC, IRON, RETICCTPCT in the last 72 hours. Sepsis Labs: Recent Labs  Lab 06/27/19 1523  LATICACIDVEN 1.2    Recent Results (from the past 240 hour(s))  SARS CORONAVIRUS 2 (TAT 6-24 HRS) Nasopharyngeal Nasopharyngeal Swab     Status: None   Collection Time: 06/27/19  5:27 PM   Specimen: Nasopharyngeal Swab  Result Value Ref Range Status   SARS Coronavirus 2 NEGATIVE NEGATIVE Final    Comment: (NOTE) SARS-CoV-2 target nucleic acids are NOT DETECTED. The SARS-CoV-2 RNA is generally detectable in upper and lower respiratory specimens during the acute phase of infection. Negative results do not preclude SARS-CoV-2 infection, do not rule out co-infections with other pathogens, and should not be used as the sole basis for treatment or other patient management decisions. Negative results must be combined with clinical observations, patient history, and epidemiological information. The expected result is Negative. Fact Sheet for Patients: SugarRoll.be Fact Sheet for Healthcare Providers: https://www.woods-mathews.com/ This test is not yet approved or cleared by the [place]  FDA and  has been authorized for detection and/or diagnosis of SARS-CoV-2 by FDA under an Emergency Use Authorization (EUA). This EUA will remain  in effect (meaning this test can be used) for the duration of the COVID-19 declaration under Section 56 4(b)(1) of the Act, 21 U.S.C. section 360bbb-3(b)(1), unless the authorization is terminated or revoked sooner. Performed at [facility], [place] [address] [place], [place] [address]   Culture, blood (routine x 2)     Status: None   Collection Time: 06/27/19 11:35 PM   Specimen: BLOOD  Result Value Ref Range Status   Specimen Description BLOOD RIGHT HAND  Final   Special Requests   Final    BOTTLES DRAWN AEROBIC AND ANAEROBIC Blood Culture adequate volume   Culture   Final    NO GROWTH 5 DAYS Performed at [facility], [address] [place], [place] [address]    Report Status 07/03/2019 FINAL  Final  Culture, blood (routine x 2)     Status: None   Collection Time: 06/27/19 11:37 PM   Specimen: BLOOD  Result Value Ref Range Status   Specimen Description BLOOD RIGHT FOREARM  Final   Special Requests   Final    BOTTLES DRAWN AEROBIC AND ANAEROBIC Blood Culture adequate volume   Culture   Final    NO GROWTH 5 DAYS Performed at [facility], [place] [address], [place], [place] [address]    Report Status 07/03/2019 FINAL  Final  Urine culture     Status: None   Collection Time: 06/28/19  5:12 AM   Specimen: Urine, Clean Catch  Result Value Ref Range Status   Specimen Description URINE, CLEAN CATCH  Final   Special Requests NONE  Final   Culture   Final    NO GROWTH Performed at [facility], [place] [address], [place], [place] [address]    Report Status 06/29/2019 FINAL  Final  GI pathogen panel by PCR, stool     Status: None   Collection Time: 06/29/19 12:38 PM   Specimen: Stool  Result Value Ref Range Status   Plesiomonas shigelloides NOT DETECTED NOT DETECTED Final   Yersinia enterocolitica NOT DETECTED  NOT DETECTED Final   Vibrio NOT DETECTED NOT DETECTED Final   Enteropathogenic E coli NOT DETECTED NOT DETECTED Final   E coli (ETEC) LT/ST NOT DETECTED NOT DETECTED Final   E coli 5883 by PCR Not applicable NOT DETECTED Final   Cryptosporidium by PCR NOT DETECTED NOT DETECTED Final   Entamoeba histolytica NOT DETECTED NOT DETECTED Final   Adenovirus F 40/41 NOT DETECTED NOT DETECTED Final   Norovirus GI/GII NOT DETECTED NOT DETECTED Final   Sapovirus NOT DETECTED NOT DETECTED Final    Comment: (NOTE) Performed At: [facility] [place], [place] [id number] Rush Farmer MD RA:[id number]    Vibrio cholerae NOT DETECTED NOT DETECTED Final   Campylobacter by PCR NOT DETECTED NOT DETECTED Final   Salmonella by PCR NOT DETECTED NOT DETECTED Final   E coli (STEC) NOT DETECTED NOT DETECTED Final   Enteroaggregative E coli NOT DETECTED NOT DETECTED Final   Shigella by PCR NOT DETECTED NOT DETECTED Final   Cyclospora cayetanensis NOT DETECTED NOT DETECTED Final   Astrovirus NOT DETECTED NOT DETECTED Final   G lamblia by PCR NOT DETECTED NOT DETECTED Final   Rotavirus A by PCR NOT DETECTED NOT DETECTED Final  C difficile quick scan w PCR reflex     Status: None   Collection Time: 06/29/19 12:38 PM   Specimen: Stool  Result Value Ref Range Status   C Diff antigen NEGATIVE NEGATIVE Final   C Diff toxin NEGATIVE NEGATIVE Final   C Diff interpretation No C. difficile detected.  Final    Comment: Performed at [facility], [place] [address] [place], [place] [address]         Radiology Studies: No results found.      Scheduled Meds: . allopurinol  300 mg Oral Daily  . Chlorhexidine Gluconate Cloth  6 each Topical Daily  . dicyclomine  20 mg Oral TID AC & HS  . enoxaparin (LOVENOX) injection  0.5 mg/kg Subcutaneous Q24H  . feeding supplement (ENSURE ENLIVE)  237 mL Oral BID BM  . insulin aspart  0-15 Units Subcutaneous Q4H  . lipase/protease/amylase  36,000 Units Oral TID AC  . pantoprazole  40 mg Oral BID  . sodium chloride flush  10-40 mL Intracatheter Q12H  . sodium chloride flush  10-40 mL Intracatheter Q12H  . sucralfate  1 g Oral TID WC & HS   Continuous Infusions: . ceFEPime (MAXIPIME) IV 2 g (07/03/19 0541)  . metronidazole 500 mg (07/03/19 1110)  . TPN CYCLIC-ADULT (ION)       LOS: 6 days    Time spent: 36 minutes spent on chart review, discussion with nursing staff, consultants, updating family and interview/physical exam; more than 50% of that time was spent in counseling and/or coordination of care.    Sherrick Araki J [place] DO [facility] Available via Epic secure chat 7am-7pm After these hours, please refer to coverage provider listed on [email or web address] 07/03/2019, 12:30 PM
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
## 2019-07-04 DIAGNOSIS — R112 Nausea with vomiting, unspecified: Secondary | ICD-10-CM

## 2019-07-04 DIAGNOSIS — E440 Moderate protein-calorie malnutrition: Secondary | ICD-10-CM

## 2019-07-04 LAB — BASIC METABOLIC PANEL
Anion gap: 9 (ref 5–15)
BUN: 7 mg/dL (ref 6–20)
CO2: 23 mmol/L (ref 22–32)
Calcium: 8.5 mg/dL — ABNORMAL LOW (ref 8.9–10.3)
Chloride: 104 mmol/L (ref 98–111)
Creatinine, Ser: 0.52 mg/dL — ABNORMAL LOW (ref 0.61–1.24)
GFR calc Af Amer: >60 mL/min (ref >60)
GFR calc non Af Amer: >60 mL/min (ref >60)
Glucose, Bld: 155 mg/dL — ABNORMAL HIGH (ref 70–99)
Potassium: 3.6 mmol/L (ref 3.5–5.1)
Sodium: 136 mmol/L (ref 135–145)

## 2019-07-04 LAB — GLUCOSE, CAPILLARY
Glucose-Capillary: 126 mg/dL — ABNORMAL HIGH (ref 70–99)
Glucose-Capillary: 146 mg/dL — ABNORMAL HIGH (ref 70–99)
Glucose-Capillary: 153 mg/dL — ABNORMAL HIGH (ref 70–99)
Glucose-Capillary: 160 mg/dL — ABNORMAL HIGH (ref 70–99)
Glucose-Capillary: 168 mg/dL — ABNORMAL HIGH (ref 70–99)
Glucose-Capillary: 179 mg/dL — ABNORMAL HIGH (ref 70–99)
Glucose-Capillary: 182 mg/dL — ABNORMAL HIGH (ref 70–99)

## 2019-07-04 LAB — PHOSPHORUS: Phosphorus: 3.3 mg/dL (ref 2.5–4.6)

## 2019-07-04 LAB — MAGNESIUM: Magnesium: 1.8 mg/dL (ref 1.7–2.4)

## 2019-07-04 MED ORDER — OXYCODONE HCL 5 MG PO TABS
10.0000 mg | ORAL_TABLET | Freq: Four times a day (QID) | ORAL | Status: AC
  Administered 2019-07-04 – 2019-07-05 (×4): 10 mg via ORAL
  Filled 2019-07-04 (×4): qty 2

## 2019-07-04 MED ORDER — TRACE MINERALS CU-MN-SE-ZN 300-55-60-3000 MCG/ML IV SOLN: INTRAVENOUS | Status: DC

## 2019-07-04 MED ORDER — TRACE MINERALS CU-MN-SE-ZN 300-55-60-3000 MCG/ML IV SOLN
INTRAVENOUS | Status: AC
  Filled 2019-07-04: qty 710.4

## 2019-07-04 NOTE — Plan of Care (Signed)

## 2019-07-04 NOTE — Progress Notes (Addendum)
    Progress Note Weekend Coverage Dr. Elnoria Howard   Subjective  Chief Complaint: Status post necrosectomy for infected fluid collection/W ON, severe malnutrition, persistent abdominal pain, shortness of breath  This morning patient is in good spirits and tells me that he feels better than he did yesterday.  He was able to take a few bites of his food but tells me that this gives him indigestion.  Does tell me that the pain medicine he is on does not completely take care of the pain, it may lower it from 8/10 down to a 6/10 but does not completely relieve it.  He wonders if there is anything else that would be better.  Also tells me that any p.o. medicines just seem not to do anything.   Objective   Vital signs in last 24 hours: Temp:  [97.8 F (36.6 C)-98.3 F (36.8 C)] 98.3 F (36.8 C) (03/20 0418) Pulse Rate:  [81-86] 86 (03/20 0418) Resp:  [15-18] 15 (03/20 0418) BP: (129-135)/(71-83) 129/83 (03/20 0418) SpO2:  [100 %] 100 % (03/20 0418) Last BM Date: 07/02/19 General:    AA male in NAD Heart:  Regular rate and rhythm; no murmurs Lungs: Respirations even and unlabored, lungs CTA bilaterally Abdomen:  Soft, moderate LUQ/Rpigastric ttp and nondistended. Normal bowel sounds. Extremities:  Without edema. Neurologic:  Alert and oriented,  grossly normal neurologically. Psych:  Cooperative. Normal mood and affect.  Intake/Output from previous day: 03/19 0701 - 03/20 0700 In: 2091.7 [P.O.:720; I.V.:860.8; IV Piggyback:510.9] Out: 2200 [Urine:2200]  Lab Results: Recent Labs    07/02/19 0328  WBC 4.4  HGB 7.7*  HCT 26.0*  PLT 181   BMET Recent Labs    07/02/19 0328 07/03/19 0452 07/04/19 0329  NA 135 136 136  K 3.8 3.9 3.6  CL 106 106 104  CO2 21* 22 23  GLUCOSE 183* 146* 155*  BUN 14 11 7   CREATININE 0.60* 0.50* 0.52*  CALCIUM 8.1* 8.3* 8.5*   LFT Recent Labs    07/02/19 0328  PROT 5.7*  ALBUMIN 2.0*  AST 9*  ALT 7  ALKPHOS 62  BILITOT <0.1*     Assessment /  Plan:   Assessment: 1.  Status post necrosectomy for infected fluid collection/WN 2.  Severe malnutrition 3.  Persistent abdominal pain 4.  Shortness of breath  Plan: 1.  Continue Sucralfate and Pantoprazole twice daily 2.  Continue with TPN 3.  Continue antibiotics 4.  Patient is on Dilaudid every 4 hours as needed.  Discussed with Dr. 07/04/19.  Patient is not on any scheduled scheduled pain medicine.  At this time will stop Norco/Vicodin and start Oxycodone 10 mg scheduled every 6 hours for the next 24 hours.  This can be held if patient is asleep or not in any pain.  Patient can then have Dilaudid for breakthrough pain. 5.  Please await any further recommendations Dr. Rhea Belton  We will continue to follow for Dr. Rhea Belton over the weekend.   LOS: 7 days   Elnoria Howard  07/04/2019, 8:44 AM

## 2019-07-04 NOTE — Progress Notes (Signed)
PROGRESS NOTE    KENSON GROH  GUR:[id number] DOB: 1965/04/20 DOA: 06/27/2019 PCP: Patient, No Pcp Per    Brief Narrative:  [name]  Is a 54 year old male with past medical history remarkable for type 2 diabetes mellitus, essential hypertension, morbid obesity, pancreatitis with pseudocyst, gout, and history of noncompliance who presented with intractable nausea/vomiting and associated abdominal pain.  He called his gastroenterology office, who advised him to proceed to the ED for further evaluation.  Recently hospitalized and discharged on 06/22/2019 for DKA, AKI, and acute pancreatitis with pseudocyst.  During the hospitalization he underwent multiple EGDs with cysto gastrostomy tube creation and double pigtail replacements.  Patient also underwent necrosectomy for his acute pancreatitis with necrosis and infected pseudocyst with Staph aureus.  He was initially placed on vancomycin and Flagyl and transition to doxycycline and metronidazole at discharge.  Plan was to continue antibiotics for 2 weeks until repeat CT abdomen /pelvis was performed.  Patient was also on TPN which was reduced to half dose at time of discharge.  In the ED had basic blood work done as well as a CT of the abdomen and pelvis. He was given a liter of normal saline and given pain control with hydromorphone and antiemetics with promethazine. Patient was started the patient on antibiotics with vancomycin, ceftriaxone and Flagyl and ordered blood cultures. I asked the EDP to Urs[place] and she is going to call the gastroenterologist on call for further evaluation.  Assessment & Plan:   Active Problems:   Morbid obesity (HCC)   Abdominal pain, epigastric   Abnormal CT of the abdomen   Protein-calorie malnutrition, severe   Pancreatic necrosis   Infected pancreatic pseudocyst   Intractable nausea and vomiting   Abdominal pain   Diabetes mellitus type 2 in obese (HCC)   Malnutrition of moderate degree   Acute  on chronic pancreatitis with necrosis Infected pseudocyst with MSSA Patient presenting with recurrent nausea/vomiting and abdominal pain. CT abd pelvis notable for two cystgastrostomy catheters decompressiing previous large pancreatic pseudocyst and two small heterogeneous collections persisting not drained by catheters; one adjacent to pancreatic tail and one adjacent to uncinate process.  Gastroenterology discussed case with IR, unable to place drain surrounding uncinate process given proximity to SMA. --GI Dr. Benson Norway following; appreciate assistance --Continue antibiotics with Cefepime and Flagyl --Continue TPN; reduced dose to 75% for abd pain/fullness; pharmacy following for management --GI started scheduled oxycodone 63m PO q6h scheduled today, with dilaudid IV for breakthrough pain --Low-fat diet --continue creon with meals  Type 2 diabetes mellitus, poorly controlled Hemoglobin A1c 8.7 06/27/2019; improved from 13.2 04/22/2019.  --continue insulin in TPN, currently 45u regular insulin; management per pharmacy --avoiding basal insulin while on TPN --Continue ISS for further coverage --Diabetic educator consult, given his complex case with TPN and irregular meal consumption  Gout: continue allopurinol 3025mPO daily  Pseudohyponatremia Etiology etiology likely secondary to hyperglycemia, now resolved with improved glycemic control. --monitor BMP daily  Moderate protein calorie malnutrition Prealbumin 13.5. --Nutrition following, appreciate assistance --continue TPN, 75% dose --boost Breeze twice daily --Low fat diet  Nutrition Status: Nutrition Problem: Moderate Malnutrition Etiology: chronic illness(pancreatitis) Signs/Symptoms: percent weight loss, energy intake < or equal to 75% for > or equal to 1 month, mild fat depletion, moderate fat depletion, mild muscle depletion, moderate muscle depletion Percent weight loss: 13.7 % Interventions: Boost Breeze,  TPN  Hypokalemia Repleted during hospitalization.  Potassium 3.9 today. --Continue supplementation per nutrition/TPN --BMP daily  Hypomagnesmia Magnesium 1.8 today,  will replete.  GERD: continue PPI  Obesity Body mass index is 39.73 kg/m. discussed need for lifestyle changes /weight reduction as this complicates all facets of care  Normocytic anemia Hemoglobin stable, 7.7  Anxiety: continue xanax prn   DVT prophylaxis: Lovenox Code Status: Full Code Family Communication: Updated patient at bedside  Lines: Right UE PICC: placed 06/28/2019 Disposition Plan:      Patient is from: Home     Anticipated Disposition: Home with Home health RN, TPN     Barriers to discharge or conditions that needs to be met prior to discharge: Decreased reliance on IV pain medicine, sign off by gastroenterology, abdominal pain improved with better toleration of oral intake  Consultants:   GI - Dr. Benson Norway  Procedures:   none  Antimicrobials:   Cefepime 3/15>>  Flagyl 3/13>>  Vancomycin 3/13 - 3/15  Ceftriaxone 3/13 - 3/15    Subjective: Patient seen and examined at bedside.  Sitting at bedside eating breakfast.  States abdominal discomfort has improved greatly since yesterday.  States oral pain medication not too effective and IV pain medication wears off quickly.  GI scheduled oxycodone every 6 hours today.  Continues on TPN.  No other complaints. No overnight concerns per nursing staff.  Objective: Vitals:   07/03/19 1424 07/03/19 1945 07/04/19 0418 07/04/19 0846  BP: 133/71 135/81 129/83 138/83  Pulse: 82 81 86 91  Resp: '18  15 18  ' Temp: 98.3 F (36.8 C) 97.8 F (36.6 C) 98.3 F (36.8 C) (!) 97.5 F (36.4 C)  TempSrc: Oral Oral  Oral  SpO2: 100% 100% 100% 99%  Weight:      Height:        Intake/Output Summary (Last 24 hours) at 07/04/2019 1339 Last data filed at 07/04/2019 1007 Gross per 24 hour  Intake 2091.72 ml  Output 2000 ml  Net 91.72 ml   Filed Weights    06/27/19 1306 06/28/19 0457 07/02/19 0500  Weight: 128 kg 125.3 kg 129.2 kg    Examination:  General exam: Appears calm and comfortable  Respiratory system: Clear to auscultation. Respiratory effort normal. Cardiovascular system: S1 & S2 heard, RRR. No JVD, murmurs, rubs, gallops or clicks. No pedal edema. Gastrointestinal system: Abdomen is nondistended, soft and mild generalized tenderness. No organomegaly or masses felt. Normal bowel sounds heard. Central nervous system: Alert and oriented. No focal neurological deficits. Extremities: Symmetric 5 x 5 power. Skin: No rashes, lesions or ulcers Psychiatry: Judgement and insight appear normal. Mood & affect appropriate.     Data Reviewed: I have personally reviewed following labs and imaging studies  CBC: Recent Labs  Lab 06/27/19 1316 06/27/19 1316 06/27/19 1546 06/27/19 2326 06/29/19 0311 06/30/19 0251 07/02/19 0328  WBC 9.3  --   --  5.3 5.2 4.9 4.4  NEUTROABS 8.3*  --   --  3.2 3.5 2.9  --   HGB 9.1*   < > 9.9* 7.8* 7.7* 7.7* 7.7*  HCT 31.4*   < > 29.0* 25.3* 25.5* 25.1* 26.0*  MCV 87.0  --   --  81.9 84.7 84.2 84.7  PLT 230  --   --  225 218 211 181   < > = values in this interval not displayed.   Basic Metabolic Panel: Recent Labs  Lab 06/27/19 2326 06/27/19 2326 06/29/19 0311 06/29/19 0311 06/30/19 0251 07/01/19 0438 07/02/19 0328 07/03/19 0452 07/04/19 0329  NA 135   < > 138   < > 138 136 135 136 136  K  3.7   < > 2.8*   < > 3.5 3.6 3.8 3.9 3.6  CL 103   < > 108   < > 108 107 106 106 104  CO2 22   < > 23   < > 23 20* 21* 22 23  GLUCOSE 260*   < > 86   < > 161* 192* 183* 146* 155*  BUN 8   < > 6   < > '12 16 14 11 7  ' CREATININE 0.62   < > 0.63   < > 0.59* 0.56* 0.60* 0.50* 0.52*  CALCIUM 8.6*   < > 8.4*   < > 8.0* 8.0* 8.1* 8.3* 8.5*  MG 1.7   < > 1.7   < > 2.0 1.9 1.8 1.7 1.8  PHOS 3.7  --  4.0  --  3.9  --  2.8  --  3.3   < > = values in this interval not displayed.   GFR: Estimated Creatinine  Clearance: 148 mL/min (A) (by C-G formula based on SCr of 0.52 mg/dL (L)). Liver Function Tests: Recent Labs  Lab 06/27/19 1316 06/27/19 2326 06/29/19 0311 06/30/19 0251 07/02/19 0328  AST 11* 11* 11* 11* 9*  ALT '9 8 9 8 7  ' ALKPHOS 99 87 78 70 62  BILITOT 0.5 0.3 0.5 0.4 <0.1*  PROT 7.4 6.8 6.4* 6.0* 5.7*  ALBUMIN 2.6* 2.3* 2.1* 1.9* 2.0*   Recent Labs  Lab 06/27/19 1316  LIPASE 30   No results for input(s): AMMONIA in the last 168 hours. Coagulation Profile: No results for input(s): INR, PROTIME in the last 168 hours. Cardiac Enzymes: No results for input(s): CKTOTAL, CKMB, CKMBINDEX, TROPONINI in the last 168 hours. BNP (last 3 results) No results for input(s): PROBNP in the last 8760 hours. HbA1C: No results for input(s): HGBA1C in the last 72 hours. CBG: Recent Labs  Lab 07/03/19 2138 07/04/19 0001 07/04/19 0419 07/04/19 0844 07/04/19 1151  GLUCAP 180* 182* 160* 153* 179*   Lipid Profile: No results for input(s): CHOL, HDL, LDLCALC, TRIG, CHOLHDL, LDLDIRECT in the last 72 hours. Thyroid Function Tests: No results for input(s): TSH, T4TOTAL, FREET4, T3FREE, THYROIDAB in the last 72 hours. Anemia Panel: No results for input(s): VITAMINB12, FOLATE, FERRITIN, TIBC, IRON, RETICCTPCT in the last 72 hours. Sepsis Labs: Recent Labs  Lab 06/27/19 1523  LATICACIDVEN 1.2    Recent Results (from the past 240 hour(s))  SARS CORONAVIRUS 2 (TAT 6-24 HRS) Nasopharyngeal Nasopharyngeal Swab     Status: None   Collection Time: 06/27/19  5:27 PM   Specimen: Nasopharyngeal Swab  Result Value Ref Range Status   SARS Coronavirus 2 NEGATIVE NEGATIVE Final    Comment: (NOTE) SARS-CoV-2 target nucleic acids are NOT DETECTED. The SARS-CoV-2 RNA is generally detectable in upper and lower respiratory specimens during the acute phase of infection. Negative results do not preclude SARS-CoV-2 infection, do not rule out co-infections with other pathogens, and should not be used as  the sole basis for treatment or other patient management decisions. Negative results must be combined with clinical observations, patient history, and epidemiological information. The expected result is Negative. Fact Sheet for Patients: SugarRoll.be Fact Sheet for Healthcare Providers: https://www.woods-mathews.com/ This test is not yet approved or cleared by the [place] FDA and  has been authorized for detection and/or diagnosis of SARS-CoV-2 by FDA under an Emergency Use Authorization (EUA). This EUA will remain  in effect (meaning this test can be used) for the duration of the COVID-19  declaration under Section 56 4(b)(1) of the Act, 21 U.S.C. section 360bbb-3(b)(1), unless the authorization is terminated or revoked sooner. Performed at [facility], [place] [address] [place], [place] [address]   Culture, blood (routine x 2)     Status: None   Collection Time: 06/27/19 11:35 PM   Specimen: BLOOD  Result Value Ref Range Status   Specimen Description BLOOD RIGHT HAND  Final   Special Requests   Final    BOTTLES DRAWN AEROBIC AND ANAEROBIC Blood Culture adequate volume   Culture   Final    NO GROWTH 5 DAYS Performed at [facility], [place] [address], [place], [place] [address]    Report Status 07/03/2019 FINAL  Final  Culture, blood (routine x 2)     Status: None   Collection Time: 06/27/19 11:37 PM   Specimen: BLOOD  Result Value Ref Range Status   Specimen Description BLOOD RIGHT FOREARM  Final   Special Requests   Final    BOTTLES DRAWN AEROBIC AND ANAEROBIC Blood Culture adequate volume   Culture   Final    NO GROWTH 5 DAYS Performed at [facility], [place] [address] [place], [place] [address]    Report Status 07/03/2019 FINAL  Final  Urine culture     Status: None   Collection Time: 06/28/19  5:12 AM   Specimen: Urine, Clean Catch  Result Value Ref Range Status   Specimen Description URINE, CLEAN CATCH   Final   Special Requests NONE  Final   Culture   Final    NO GROWTH Performed at [facility], [place] [address], [place], [place] [address]    Report Status 06/29/2019 FINAL  Final  GI pathogen panel by PCR, stool     Status: None   Collection Time: 06/29/19 12:38 PM   Specimen: Stool  Result Value Ref Range Status   Plesiomonas shigelloides NOT DETECTED NOT DETECTED Final   Yersinia enterocolitica NOT DETECTED NOT DETECTED Final   Vibrio NOT DETECTED NOT DETECTED Final   Enteropathogenic E coli NOT DETECTED NOT DETECTED Final   E coli (ETEC) LT/ST NOT DETECTED NOT DETECTED Final   E coli 0160 by PCR Not applicable NOT DETECTED Final   Cryptosporidium by PCR NOT DETECTED NOT DETECTED Final   Entamoeba histolytica NOT DETECTED NOT DETECTED Final   Adenovirus F 40/41 NOT DETECTED NOT DETECTED Final   Norovirus GI/GII NOT DETECTED NOT DETECTED Final   Sapovirus NOT DETECTED NOT DETECTED Final    Comment: (NOTE) Performed At: [facility] [place], [place] [id number] Rush Farmer MD DU:[id number]    Vibrio cholerae NOT DETECTED NOT DETECTED Final   Campylobacter by PCR NOT DETECTED NOT DETECTED Final   Salmonella by PCR NOT DETECTED NOT DETECTED Final   E coli (STEC) NOT DETECTED NOT DETECTED Final   Enteroaggregative E coli NOT DETECTED NOT DETECTED Final   Shigella by PCR NOT DETECTED NOT DETECTED Final   Cyclospora cayetanensis NOT DETECTED NOT DETECTED Final   Astrovirus NOT DETECTED NOT DETECTED Final   G lamblia by PCR NOT DETECTED NOT DETECTED Final   Rotavirus A by PCR NOT DETECTED NOT DETECTED Final  C difficile quick scan w PCR reflex     Status: None   Collection Time: 06/29/19 12:38 PM   Specimen: Stool  Result Value Ref Range Status   C Diff antigen NEGATIVE NEGATIVE Final   C Diff toxin NEGATIVE NEGATIVE Final  C Diff interpretation No C. difficile detected.  Final    Comment: Performed at [facility], [place] [address]  [place], [place] [address]         Radiology Studies: No results found.      Scheduled Meds: . allopurinol  300 mg Oral Daily  . Chlorhexidine Gluconate Cloth  6 each Topical Daily  . dicyclomine  20 mg Oral TID AC & HS  . enoxaparin (LOVENOX) injection  0.5 mg/kg Subcutaneous Q24H  . feeding supplement (ENSURE ENLIVE)  237 mL Oral BID BM  . insulin aspart  0-15 Units Subcutaneous Q4H  . lipase/protease/amylase  36,000 Units Oral TID AC  . oxyCODONE  10 mg Oral Q6H  . pantoprazole  40 mg Oral BID  . sodium chloride flush  10-40 mL Intracatheter Q12H  . sodium chloride flush  10-40 mL Intracatheter Q12H  . sucralfate  1 g Oral TID WC & HS   Continuous Infusions: . ceFEPime (MAXIPIME) IV 2 g (07/04/19 0548)  . metronidazole 500 mg (07/04/19 1038)  . TPN CYCLIC-ADULT (ION)       LOS: 7 days    Time spent: 37 minutes spent on chart review, discussion with nursing staff, consultants, updating family and interview/physical exam; more than 50% of that time was spent in counseling and/or coordination of care.    Talullah Abate J [place] DO [facility] Available via Epic secure chat 7am-7pm After these hours, please refer to coverage provider listed on [email or web address] 07/04/2019, 1:39 PM

## 2019-07-04 NOTE — Progress Notes (Deleted)
Discharge instructions given, IV removed. Discharged via wheelchair with wife 

## 2019-07-04 NOTE — Progress Notes (Signed)
PHARMACY - TOTAL PARENTERAL NUTRITION CONSULT NOTE  Indication:  Infected pancreatic pseudocyst  Patient Measurements: Height: 5\' 11"  (180.3 cm) Weight: 276 lb 3.8 oz (125.3 kg) IBW/kg (Calculated) : 75.3 TPN AdjBW (KG): 88.5 Body mass index is 38.53 kg/m.  Assessment:  Caleb Chambers on chronic TPN presenting 06/27/19 with N/V/D and 10/10 abdominal pain. PMH significant for uncontrolled DM2, gout, HTN, pancreatitis and history of noncompliance. Patient recently discharged 06/22/19 after admission for DKA, AKI, and acute pancreatitis with infected pseudocyst, s/p multiple EGD, necrosectomy procedures with cyst gastrostomy. Patient was discharged on doxy and metronidazole. TPN was reduced during previous hospitalization to 1/2 dose on discharge to supplement PO diet. Antibiotics have been broadened and GI consulted. Patient reports no missed days of TPN or toleration issues PTA, last 3/13.  Glucose / Insulin: A1c 13.2 > 8.7%. CBGs 150-182. Required 45 units of insulin in TPN + 17 units moderate SSI last 24hrs.  Electrolytes:  Lytes ok - K 3.6, Mg 1.8, Phos 3.3.  Renal: SCr 0.Caleb (stable) and BUN WNL LFTs / TGs: LFTs / tbili / TG WNL Prealbumin / albumin: pre-albumin 13.5, albumin 2 Intake / Output; MIVF: UOP 0.5 ml/kg/hr, net +7.2L.  Diarrhea improving per patient. Sucralfate added. Continues on Protonix BID.  GI Imaging: 3/13 CT abd/pelvis: residual persistent collections Surgeries / Procedures: IR to drain uncinate fluid collection if able  Central access: PICC placed 06/05/19 TPN start date: chronic TPN PTA; 3/14 inpatient  Current TPN Rx [facility]): -15% Amino Acid 71 g/day, Dextrose 154 g/day, Lipids 36 g/day - provides 1168 kcal/day -Electrolytes:  Ca gluconate 5 meq, Mag sulfate 19.2 meq, Potassium Acetate 0 meq, Potassium Cl 5 meq, K Phosphate 10 mmol, Sodium Acetate 0 meq, Sodium Cl 77 meq, Sodium Phosphate 0 mmol -Additives:  Trace elements 1 mL, MVI 10 mL, Regular insulin 45 units -Administer  over 12 hrs with 1 hr ramp up and 1 hr ramp down  Nutritional Goals (per RD rec on 3/16): 2350-2550 kCal, 140-155g AA, >2.3L fluid per day  Current Nutrition:  TPN  Regular diet - consumed 100% of lunch and dinner yesterday once abdominal pain and nausea resolved.   Ensure Enlive po BID (each supplement provides 350 kcal and 20g protein) - none yet.  Plan:  Per discussion with Dr. Benson Norway and Dr. [place] 3/19: ok to reduce TPN to 75% of goal. Patient in agreement and Pma at [facility] updated 3/19.   Continue custom concentrated cyclic TPN to ~88%:  infuse 1440 mls over 12 hrs: 65 mL/hr x 1 hr, then 131 mL/hr x 10 hrs, then 65 mL/hr x 1 hr.  TPN provides 107g AA, 245g CHO, 54g ILE for total 1798 kCal, meeting 75% of patient needs. Electrolytes in TPN: 50 mEq/L Na, continue increased K at 52mEq/L, 2.64mEq/L Ca, 15 mEq/L Mag, and 5 mmol/L Phos. Adjust Cl:Ac to 1:2 Daily trace element and folate in TPN, MWF multivitamin d/t national shortage  Continue moderate SSI Q4H. Continue regular insulin in TPN to 45units (off Lantus 3/15) with reduction in TPN.  F/U AM labs, I/O's, CBGs  *DM Coordinator recommending patient to receive Novolog 3 units TID with meals by injection when TPN is off during the day. AVOID basal insulin while on cyclic TPN due to risk of lows during TPN off periods. Will eventually need transitioned back to basal insulin when TPN is weaned off.   Sloan Leiter, PharmD, BCPS, BCCCP Clinical Pharmacist Clinical phone 07/04/2019 until 3PM [phone] Please refer to [facility] for [facility]  Pharmacy numbers 07/04/2019, 8:26 AM

## 2019-07-05 ENCOUNTER — Inpatient Hospital Stay: Payer: Self-pay

## 2019-07-05 ENCOUNTER — Inpatient Hospital Stay (HOSPITAL_COMMUNITY): Payer: BC Managed Care – PPO

## 2019-07-05 LAB — BASIC METABOLIC PANEL
Anion gap: 8 (ref 5–15)
BUN: 9 mg/dL (ref 6–20)
CO2: 24 mmol/L (ref 22–32)
Calcium: 8.4 mg/dL — ABNORMAL LOW (ref 8.9–10.3)
Chloride: 104 mmol/L (ref 98–111)
Creatinine, Ser: 0.48 mg/dL — ABNORMAL LOW (ref 0.61–1.24)
GFR calc Af Amer: >60 mL/min (ref >60)
GFR calc non Af Amer: >60 mL/min (ref >60)
Glucose, Bld: 131 mg/dL — ABNORMAL HIGH (ref 70–99)
Potassium: 4.1 mmol/L (ref 3.5–5.1)
Sodium: 136 mmol/L (ref 135–145)

## 2019-07-05 LAB — GLUCOSE, CAPILLARY
Glucose-Capillary: 121 mg/dL — ABNORMAL HIGH (ref 70–99)
Glucose-Capillary: 134 mg/dL — ABNORMAL HIGH (ref 70–99)
Glucose-Capillary: 150 mg/dL — ABNORMAL HIGH (ref 70–99)
Glucose-Capillary: 167 mg/dL — ABNORMAL HIGH (ref 70–99)
Glucose-Capillary: 181 mg/dL — ABNORMAL HIGH (ref 70–99)
Glucose-Capillary: 190 mg/dL — ABNORMAL HIGH (ref 70–99)

## 2019-07-05 MED ORDER — OXYCODONE HCL 5 MG PO TABS
10.0000 mg | ORAL_TABLET | Freq: Four times a day (QID) | ORAL | Status: AC | PRN
  Administered 2019-07-05 (×3): 10 mg via ORAL
  Filled 2019-07-05 (×3): qty 2

## 2019-07-05 MED ORDER — HYDROCORTISONE 1 % EX CREA
TOPICAL_CREAM | Freq: Two times a day (BID) | CUTANEOUS | Status: DC
  Filled 2019-07-05: qty 28

## 2019-07-05 MED ORDER — SODIUM CHLORIDE 0.9 % IV SOLN
INTRAVENOUS | Status: DC | PRN
  Administered 2019-07-05: 1000 mL via INTRAVENOUS

## 2019-07-05 MED ORDER — DIPHENHYDRAMINE HCL 25 MG PO CAPS
25.0000 mg | ORAL_CAPSULE | Freq: Four times a day (QID) | ORAL | Status: DC | PRN
  Administered 2019-07-05 – 2019-07-08 (×5): 25 mg via ORAL
  Filled 2019-07-05 (×5): qty 1

## 2019-07-05 MED ORDER — FAMOTIDINE 20 MG PO TABS
40.0000 mg | ORAL_TABLET | Freq: Two times a day (BID) | ORAL | Status: DC
  Administered 2019-07-05 – 2019-07-09 (×8): 40 mg via ORAL
  Filled 2019-07-05 (×7): qty 2

## 2019-07-05 MED ORDER — DIPHENHYDRAMINE HCL 25 MG PO CAPS
25.0000 mg | ORAL_CAPSULE | Freq: Once | ORAL | Status: AC
  Administered 2019-07-05: 25 mg via ORAL
  Filled 2019-07-05: qty 1

## 2019-07-05 MED ORDER — TRACE MINERALS CU-MN-SE-ZN 300-55-60-3000 MCG/ML IV SOLN
INTRAVENOUS | Status: AC
  Filled 2019-07-05: qty 710.4

## 2019-07-05 MED ORDER — ALTEPLASE 2 MG IJ SOLR
2.0000 mg | INTRAMUSCULAR | Status: AC
  Administered 2019-07-05 (×2): 2 mg

## 2019-07-05 MED ORDER — SODIUM CHLORIDE 0.9% FLUSH
10.0000 mL | INTRAVENOUS | Status: DC | PRN
  Administered 2019-07-05: 10 mL

## 2019-07-05 MED ORDER — SODIUM CHLORIDE 0.9% FLUSH
10.0000 mL | Freq: Two times a day (BID) | INTRAVENOUS | Status: DC
  Administered 2019-07-05 – 2019-07-09 (×4): 10 mL

## 2019-07-05 MED ORDER — ALUM & MAG HYDROXIDE-SIMETH 200-200-20 MG/5ML PO SUSP: 15.0000 mL | ORAL | Status: DC | PRN

## 2019-07-05 NOTE — Progress Notes (Signed)
PHARMACY - TOTAL PARENTERAL NUTRITION CONSULT NOTE  Indication:  Infected pancreatic pseudocyst  Patient Measurements: Height: 5\' 11"  (180.3 cm) Weight: 276 lb 3.8 oz (125.3 kg) IBW/kg (Calculated) : 75.3 TPN AdjBW (KG): 88.5 Body mass index is 38.53 kg/m.  Assessment:  53 YOM on chronic TPN presenting 06/27/19 with N/V/D and 10/10 abdominal pain. PMH significant for uncontrolled DM2, gout, HTN, pancreatitis and history of noncompliance. Patient recently discharged 06/22/19 after admission for DKA, AKI, and acute pancreatitis with infected pseudocyst, s/p multiple EGD, necrosectomy procedures with cyst gastrostomy. Patient was discharged on doxy and metronidazole. TPN was reduced during previous hospitalization to 1/2 dose on discharge to supplement PO diet. Antibiotics have been broadened and GI consulted. Patient reports no missed days of TPN or toleration issues PTA, last 3/13.  Glucose / Insulin: A1c 13.2 > 8.7%. CBGs improved, 126-168. Required 45 units of insulin in TPN + 13 units moderate SSI last 24 hrs (amt of SSI dependent on PO intake).  Electrolytes:  Lytes ok - K 4.1, 3/20: Mg 1.8, Phos 3.3.  Renal: SCr 0.48 (stable) and BUN WNL LFTs / TGs: LFTs / tbili / TG WNL Prealbumin / albumin: pre-albumin 13.5, albumin 2 Intake / Output; MIVF: UOP 0.5 ml/kg/hr, net +7.2L.  Diarrhea improving per patient. Sucralfate added. Continues on Protonix BID.  GI Imaging: 3/13 CT abd/pelvis: residual persistent collections Surgeries / Procedures: IR to drain uncinate fluid collection if able  Central access: PICC placed 06/05/19 TPN start date: chronic TPN PTA; 3/14 inpatient  Current TPN Rx [facility]): -15% Amino Acid 71 g/day, Dextrose 154 g/day, Lipids 36 g/day - provides 1168 kcal/day -Electrolytes:  Ca gluconate 5 meq, Mag sulfate 19.2 meq, Potassium Acetate 0 meq, Potassium Cl 5 meq, K Phosphate 10 mmol, Sodium Acetate 0 meq, Sodium Cl 77 meq, Sodium Phosphate 0 mmol -Additives:  Trace elements 1  mL, MVI 10 mL, Regular insulin 45 units -Administer over 12 hrs with 1 hr ramp up and 1 hr ramp down  Nutritional Goals (per RD rec on 3/16): 2350-2550 kCal, 140-155g AA, >2.3L fluid per day  Current Nutrition:  TPN  Regular diet - consumed 100% of breakfast yesterday with improved pain control. Lunch and Dinner not recorded.  Ensure Enlive po BID (each supplement provides 350 kcal and 20g protein) - none yet.  Plan:  Per discussion with Dr. 4/16 and Dr. Elnoria Howard 3/19: ok to reduce TPN to 75% of goal. Patient in agreement and Pam, RN at [facility] updated 3/19.   Continue custom concentrated cyclic TPN to ~75%:  infuse 4/19 mls over 12 hrs: 65 mL/hr x 1 hr, then 131 mL/hr x 10 hrs, then 65 mL/hr x 1 hr.  TPN provides 107g AA, 245g CHO, 54g ILE for total 1798 kCal, meeting 75% of patient needs. Electrolytes in TPN: 50 mEq/L Na, 65 mEq/L K, 2.61mEq/L Ca, 15 mEq/L Mag, and 5 mmol/L Phos. Adjust Cl:Ac to 1:2 Daily trace element and folate in TPN, MWF multivitamin d/t national shortage  Continue moderate SSI Q4H. Continue regular insulin in TPN at 45 units.  F/U AM labs, I/O's, CBGs  *DM Coordinator recommending patient to receive Novolog 3 units TID with meals by injection when TPN is off during the day. AVOID basal insulin while on cyclic TPN due to risk of lows during TPN off periods. Will eventually need transitioned back to basal insulin when TPN is weaned off.   4m, PharmD, BCPS, BCCCP Clinical Pharmacist Clinical phone 07/05/2019 until 3PM [phone] Please refer to  AMION for [place] numbers 07/05/2019, 8:39 AM

## 2019-07-05 NOTE — Plan of Care (Signed)
  Problem: Education: Goal: Knowledge of General Education information will improve Description: Including pain rating scale, medication(s)/side effects and non-pharmacologic comfort measures Outcome: Progressing   Problem: Clinical Measurements: Goal: Respiratory complications will improve Outcome: Progressing Note: On room air   Problem: Activity: Goal: Risk for activity intolerance will decrease Outcome: Progressing Note: Independent in room, tolerating well   Problem: Nutrition: Goal: Adequate nutrition will be maintained Outcome: Progressing   Problem: Coping: Goal: Level of anxiety will decrease Outcome: Progressing   Problem: Elimination: Goal: Will not experience complications related to bowel motility Outcome: Progressing Note: BM this shift Goal: Will not experience complications related to urinary retention Outcome: Progressing   Problem: Pain Managment: Goal: General experience of comfort will improve Outcome: Progressing Note: Treated abdomin pain twice with oxycodone

## 2019-07-05 NOTE — Progress Notes (Signed)
Attempted to remove RA PICC line. Tender proximal to the insertion site with palpation, flinching and withdrawing with retraction of PICC.  No drainage or redness noted, no swelling present, arm warm and has strong radial pulses. Assessed vein with ultrasound, compressible, clear and large. Dr [place] notified. LA PICC removed with resistance noted while removing line. New PICC order received and placed in the left brachial vein.

## 2019-07-05 NOTE — Progress Notes (Signed)
PROGRESS NOTE    Caleb Chambers  AGT:[id number] DOB: 30-May-1965 DOA: 06/27/2019 PCP: Patient, No Pcp Per    Brief Narrative:  Caleb Chambers  Is a 54 year old male with past medical history remarkable for type 2 diabetes mellitus, essential hypertension, morbid obesity, pancreatitis with pseudocyst, gout, and history of noncompliance who presented with intractable nausea/vomiting and associated abdominal pain.  He called his gastroenterology office, who advised him to proceed to the ED for further evaluation.  Recently hospitalized and discharged on 06/22/2019 for DKA, AKI, and acute pancreatitis with pseudocyst.  During the hospitalization he underwent multiple EGDs with cysto gastrostomy tube creation and double pigtail replacements.  Patient also underwent necrosectomy for his acute pancreatitis with necrosis and infected pseudocyst with Staph aureus.  He was initially placed on vancomycin and Flagyl and transition to doxycycline and metronidazole at discharge.  Plan was to continue antibiotics for 2 weeks until repeat CT abdomen /pelvis was performed.  Patient was also on TPN which was reduced to half dose at time of discharge.  In the ED had basic blood work done as well as a CT of the abdomen and pelvis. He was given a liter of normal saline and given pain control with hydromorphone and antiemetics with promethazine. Patient was started the patient on antibiotics with vancomycin, ceftriaxone and Flagyl and ordered blood cultures. I asked the EDP to [place] and she is going to call the gastroenterologist on call for further evaluation.  Assessment & Plan:   Active Problems:   Morbid obesity (HCC)   Abdominal pain, epigastric   Abnormal CT of the abdomen   Protein-calorie malnutrition, severe   Pancreatic necrosis   Infected pancreatic pseudocyst   Intractable nausea and vomiting   Abdominal pain   Diabetes mellitus type 2 in obese (HCC)   Malnutrition of moderate degree   Acute  on chronic pancreatitis with necrosis Infected pseudocyst with MSSA Patient presenting with recurrent nausea/vomiting and abdominal pain. CT abd pelvis notable for two cystgastrostomy catheters decompressiing previous large pancreatic pseudocyst and two small heterogeneous collections persisting not drained by catheters; one adjacent to pancreatic tail and one adjacent to uncinate process.  Gastroenterology discussed case with IR, unable to place drain surrounding uncinate process given proximity to SMA. --GI Dr. Benson Norway following; appreciate assistance --Continue antibiotics with Cefepime and Flagyl --Continue TPN; reduced dose to 75% for abd pain/fullness; pharmacy following for management --GI started scheduled oxycodone 61m PO q6h scheduled, with dilaudid IV for breakthrough pain --Low-fat diet --continue creon with meals  Type 2 diabetes mellitus, poorly controlled Hemoglobin A1c 8.7 06/27/2019; improved from 13.2 04/22/2019.  --continue insulin in TPN, currently 45u regular insulin; management per pharmacy --avoiding basal insulin while on TPN --Continue ISS for further coverage --Diabetic educator consult, given his complex case with TPN and irregular meal consumption  Gout: continue allopurinol 3044mPO daily  Pseudohyponatremia Etiology etiology likely secondary to hyperglycemia, now resolved with improved glycemic control. --monitor BMP daily  Moderate protein calorie malnutrition Prealbumin 13.5. --Nutrition following, appreciate assistance --continue TPN, 75% dose --boost Breeze twice daily --Low fat diet  Nutrition Status: Nutrition Problem: Moderate Malnutrition Etiology: chronic illness(pancreatitis) Signs/Symptoms: percent weight loss, energy intake < or equal to 75% for > or equal to 1 month, mild fat depletion, moderate fat depletion, mild muscle depletion, moderate muscle depletion Percent weight loss: 13.7 % Interventions: Boost Breeze, TPN  Hypokalemia Repleted  during hospitalization.  Potassium 3.9 today. --Continue supplementation per nutrition/TPN --BMP daily  Hypomagnesmia Magnesium 1.8 today, will  replete.  GERD: continue PPI  Obesity Body mass index is 38.88 kg/m. discussed need for lifestyle changes /weight reduction as this complicates all facets of care  Normocytic anemia Hemoglobin stable, 7.7  Anxiety: continue xanax prn   DVT prophylaxis: Lovenox Code Status: Full Code Family Communication: Updated patient at bedside  Lines: Right UE PICC: placed 06/28/2019 Disposition Plan:      Patient is from: Home     Anticipated Disposition: Home with Home health RN, TPN     Barriers to discharge or conditions that needs to be met prior to discharge: Decreased reliance on IV pain medicine, sign off by gastroenterology, abdominal pain improved with better toleration of oral intake  Consultants:   GI - Dr. Benson Norway  Procedures:   none  Antimicrobials:   Cefepime 3/15>>  Flagyl 3/13>>  Vancomycin 3/13 - 3/15  Ceftriaxone 3/13 - 3/15    Subjective: Patient seen and examined at bedside.  Sitting at bedside eating breakfast. Continues with improved abdominal discomfort with scheduled oxycodone.  No other complaints.  Denies headache, no chest pain, no palpitations, no shortness of breath.  Notified by nursing regarding occluded PICC line, will attempt exchange today.  No other overnight concerns per nursing staff.  Objective: Vitals:   07/04/19 1944 07/05/19 0353 07/05/19 0719 07/05/19 0835  BP: 129/84 132/84 128/75   Pulse: 86 83 91   Resp: '17 17 16   ' Temp: 98.8 F (37.1 C) 98.6 F (37 C) 98.4 F (36.9 C)   TempSrc: Oral Oral Oral   SpO2: 100% 100% 99%   Weight:    126.5 kg  Height:        Intake/Output Summary (Last 24 hours) at 07/05/2019 1216 Last data filed at 07/05/2019 1032 Gross per 24 hour  Intake 960 ml  Output 1950 ml  Net -990 ml   Filed Weights   06/28/19 0457 07/02/19 0500 07/05/19 0835  Weight:  125.3 kg 129.2 kg 126.5 kg    Examination:  General exam: Appears calm and comfortable  Respiratory system: Clear to auscultation. Respiratory effort normal. Cardiovascular system: S1 & S2 heard, RRR. No JVD, murmurs, rubs, gallops or clicks. No pedal edema. Gastrointestinal system: Abdomen is nondistended, soft and mild generalized tenderness. No organomegaly or masses felt. Normal bowel sounds heard. Central nervous system: Alert and oriented. No focal neurological deficits. Extremities: Symmetric 5 x 5 power. Skin: No rashes, lesions or ulcers Psychiatry: Judgement and insight appear normal. Mood & affect appropriate.     Data Reviewed: I have personally reviewed following labs and imaging studies  CBC: Recent Labs  Lab 06/29/19 0311 06/30/19 0251 07/02/19 0328  WBC 5.2 4.9 4.4  NEUTROABS 3.5 2.9  --   HGB 7.7* 7.7* 7.7*  HCT 25.5* 25.1* 26.0*  MCV 84.7 84.2 84.7  PLT 218 211 832   Basic Metabolic Panel: Recent Labs  Lab 06/29/19 0311 06/29/19 0311 06/30/19 0251 06/30/19 0251 07/01/19 0438 07/02/19 0328 07/03/19 0452 07/04/19 0329 07/05/19 0556  NA 138   < > 138   < > 136 135 136 136 136  K 2.8*   < > 3.5   < > 3.6 3.8 3.9 3.6 4.1  CL 108   < > 108   < > 107 106 106 104 104  CO2 23   < > 23   < > 20* 21* '22 23 24  ' GLUCOSE 86   < > 161*   < > 192* 183* 146* 155* 131*  BUN 6   < >  12   < > '16 14 11 7 9  ' CREATININE 0.63   < > 0.59*   < > 0.56* 0.60* 0.50* 0.52* 0.48*  CALCIUM 8.4*   < > 8.0*   < > 8.0* 8.1* 8.3* 8.5* 8.4*  MG 1.7   < > 2.0  --  1.9 1.8 1.7 1.8  --   PHOS 4.0  --  3.9  --   --  2.8  --  3.3  --    < > = values in this interval not displayed.   GFR: Estimated Creatinine Clearance: 146.4 mL/min (A) (by C-G formula based on SCr of 0.48 mg/dL (L)). Liver Function Tests: Recent Labs  Lab 06/29/19 0311 06/30/19 0251 07/02/19 0328  AST 11* 11* 9*  ALT '9 8 7  ' ALKPHOS 78 70 62  BILITOT 0.5 0.4 <0.1*  PROT 6.4* 6.0* 5.7*  ALBUMIN 2.1* 1.9*  2.0*   No results for input(s): LIPASE, AMYLASE in the last 168 hours. No results for input(s): AMMONIA in the last 168 hours. Coagulation Profile: No results for input(s): INR, PROTIME in the last 168 hours. Cardiac Enzymes: No results for input(s): CKTOTAL, CKMB, CKMBINDEX, TROPONINI in the last 168 hours. BNP (last 3 results) No results for input(s): PROBNP in the last 8760 hours. HbA1C: No results for input(s): HGBA1C in the last 72 hours. CBG: Recent Labs  Lab 07/04/19 1945 07/04/19 2351 07/05/19 0354 07/05/19 0847 07/05/19 1213  GLUCAP 168* 146* 150* 134* 167*   Lipid Profile: No results for input(s): CHOL, HDL, LDLCALC, TRIG, CHOLHDL, LDLDIRECT in the last 72 hours. Thyroid Function Tests: No results for input(s): TSH, T4TOTAL, FREET4, T3FREE, THYROIDAB in the last 72 hours. Anemia Panel: No results for input(s): VITAMINB12, FOLATE, FERRITIN, TIBC, IRON, RETICCTPCT in the last 72 hours. Sepsis Labs: No results for input(s): PROCALCITON, LATICACIDVEN in the last 168 hours.  Recent Results (from the past 240 hour(s))  SARS CORONAVIRUS 2 (TAT 6-24 HRS) Nasopharyngeal Nasopharyngeal Swab     Status: None   Collection Time: 06/27/19  5:27 PM   Specimen: Nasopharyngeal Swab  Result Value Ref Range Status   SARS Coronavirus 2 NEGATIVE NEGATIVE Final    Comment: (NOTE) SARS-CoV-2 target nucleic acids are NOT DETECTED. The SARS-CoV-2 RNA is generally detectable in upper and lower respiratory specimens during the acute phase of infection. Negative results do not preclude SARS-CoV-2 infection, do not rule out co-infections with other pathogens, and should not be used as the sole basis for treatment or other patient management decisions. Negative results must be combined with clinical observations, patient history, and epidemiological information. The expected result is Negative. Fact Sheet for Patients: SugarRoll.be Fact Sheet for Healthcare  Providers: https://www.woods-mathews.com/ This test is not yet approved or cleared by the [place] FDA and  has been authorized for detection and/or diagnosis of SARS-CoV-2 by FDA under an Emergency Use Authorization (EUA). This EUA will remain  in effect (meaning this test can be used) for the duration of the COVID-19 declaration under Section 56 4(b)(1) of the Act, 21 U.S.C. section 360bbb-3(b)(1), unless the authorization is terminated or revoked sooner. Performed at [facility], [place] [address], [place], [place] [address]   Culture, blood (routine x 2)     Status: None   Collection Time: 06/27/19 11:35 PM   Specimen: BLOOD  Result Value Ref Range Status   Specimen Description BLOOD RIGHT HAND  Final   Special Requests   Final    BOTTLES DRAWN AEROBIC AND  ANAEROBIC Blood Culture adequate volume   Culture   Final    NO GROWTH 5 DAYS Performed at [facility], [place] [address], [place], [place] [address]    Report Status 07/03/2019 FINAL  Final  Culture, blood (routine x 2)     Status: None   Collection Time: 06/27/19 11:37 PM   Specimen: BLOOD  Result Value Ref Range Status   Specimen Description BLOOD RIGHT FOREARM  Final   Special Requests   Final    BOTTLES DRAWN AEROBIC AND ANAEROBIC Blood Culture adequate volume   Culture   Final    NO GROWTH 5 DAYS Performed at [facility], [place] [address], [place], [place] [address]    Report Status 07/03/2019 FINAL  Final  Urine culture     Status: None   Collection Time: 06/28/19  5:12 AM   Specimen: Urine, Clean Catch  Result Value Ref Range Status   Specimen Description URINE, CLEAN CATCH  Final   Special Requests NONE  Final   Culture   Final    NO GROWTH Performed at [facility], [place] [address] [place], [place] [address]    Report Status 06/29/2019 FINAL  Final  GI pathogen panel by PCR, stool     Status: None   Collection Time: 06/29/19 12:38 PM   Specimen: Stool  Result Value  Ref Range Status   Plesiomonas shigelloides NOT DETECTED NOT DETECTED Final   Yersinia enterocolitica NOT DETECTED NOT DETECTED Final   Vibrio NOT DETECTED NOT DETECTED Final   Enteropathogenic E coli NOT DETECTED NOT DETECTED Final   E coli (ETEC) LT/ST NOT DETECTED NOT DETECTED Final   E coli 3291 by PCR Not applicable NOT DETECTED Final   Cryptosporidium by PCR NOT DETECTED NOT DETECTED Final   Entamoeba histolytica NOT DETECTED NOT DETECTED Final   Adenovirus F 40/41 NOT DETECTED NOT DETECTED Final   Norovirus GI/GII NOT DETECTED NOT DETECTED Final   Sapovirus NOT DETECTED NOT DETECTED Final    Comment: (NOTE) Performed At: [facility] [place], [place] [id number] Rush Farmer MD [id number]    Vibrio cholerae NOT DETECTED NOT DETECTED Final   Campylobacter by PCR NOT DETECTED NOT DETECTED Final   Salmonella by PCR NOT DETECTED NOT DETECTED Final   E coli (STEC) NOT DETECTED NOT DETECTED Final   Enteroaggregative E coli NOT DETECTED NOT DETECTED Final   Shigella by PCR NOT DETECTED NOT DETECTED Final   Cyclospora cayetanensis NOT DETECTED NOT DETECTED Final   Astrovirus NOT DETECTED NOT DETECTED Final   G lamblia by PCR NOT DETECTED NOT DETECTED Final   Rotavirus A by PCR NOT DETECTED NOT DETECTED Final  C difficile quick scan w PCR reflex     Status: None   Collection Time: 06/29/19 12:38 PM   Specimen: Stool  Result Value Ref Range Status   C Diff antigen NEGATIVE NEGATIVE Final   C Diff toxin NEGATIVE NEGATIVE Final   C Diff interpretation No C. difficile detected.  Final    Comment: Performed at [facility], [place] [address], [place],  [address]         Radiology Studies: DG CHEST PORT 1 VIEW  Result Date: 07/05/2019 CLINICAL DATA:  PICC line. EXAM: PORTABLE CHEST 1 VIEW COMPARISON:  06/27/2019 FINDINGS: Patient has RIGHT-sided PICC line, tip overlying the level of the superior vena cava. Heart is enlarged and accentuated by  AP position of patient. There is  minimal atelectasis in the MEDIAL lung bases. No pneumothorax or consolidation. IMPRESSION: Interval repositioning of RIGHT-sided PICC line. Tip now overlies the superior vena cava. Mild bibasilar atelectasis. Electronically Signed   By: Nolon Nations M.D.   On: 07/05/2019 09:58        Scheduled Meds: . allopurinol  300 mg Oral Daily  . Chlorhexidine Gluconate Cloth  6 each Topical Daily  . dicyclomine  20 mg Oral TID AC & HS  . enoxaparin (LOVENOX) injection  0.5 mg/kg Subcutaneous Q24H  . famotidine  40 mg Oral BID  . feeding supplement (ENSURE ENLIVE)  237 mL Oral BID BM  . hydrocortisone cream   Topical BID  . insulin aspart  0-15 Units Subcutaneous Q4H  . lipase/protease/amylase  36,000 Units Oral TID AC  . pantoprazole  40 mg Oral BID  . sodium chloride flush  10-40 mL Intracatheter Q12H  . sodium chloride flush  10-40 mL Intracatheter Q12H  . sucralfate  1 g Oral TID WC & HS   Continuous Infusions: . ceFEPime (MAXIPIME) IV 2 g (07/05/19 0530)  . metronidazole 500 mg (07/05/19 0205)  . TPN CYCLIC-ADULT (ION)       LOS: 8 days    Time spent: 35 minutes spent on chart review, discussion with nursing staff, consultants, updating family and interview/physical exam; more than 50% of that time was spent in counseling and/or coordination of care.    Loriann Bosserman J [place] DO [facility] Available via Epic secure chat 7am-7pm After these hours, please refer to coverage provider listed on [email or web address] 07/05/2019, 12:16 PM

## 2019-07-05 NOTE — Progress Notes (Signed)
Peripherally Inserted Central Catheter Placement  The IV Nurse has discussed with the patient and/or persons authorized to consent for the patient, the purpose of this procedure and the potential benefits and risks involved with this procedure.  The benefits include less needle sticks, lab draws from the catheter, and the patient may be discharged home with the catheter. Risks include, but not limited to, infection, bleeding, blood clot (thrombus formation), and puncture of an artery; nerve damage and irregular heartbeat and possibility to perform a PICC exchange if needed/ordered by physician.  Alternatives to this procedure were also discussed.  Bard Power PICC patient education guide, fact sheet on infection prevention and patient information card has been provided to patient /or left at bedside.    PICC Placement Documentation  PICC Double Lumen 07/05/19 PICC Left Brachial 45 cm 1 cm (Active)  Indication for Insertion or Continuance of Line Administration of hyperosmolar/irritating solutions (i.e. TPN, Vancomycin, etc.) 07/05/19 1600  Exposed Catheter (cm) 1 cm 07/05/19 1600  Site Assessment Clean;Dry;Intact 07/05/19 1600  Lumen #1 Status Flushed;Blood return noted;Saline locked 07/05/19 1600  Lumen #2 Status Flushed;Blood return noted;Saline locked 07/05/19 1600  Dressing Type Transparent 07/05/19 1600  Dressing Status Clean;Dry;Intact;Antimicrobial disc in place 07/05/19 1600  Line Care Connections checked and tightened 07/05/19 1600  Dressing Change Due 07/12/19 07/05/19 1600       Edwin Cap 07/05/2019, 4:13 PM

## 2019-07-05 NOTE — Progress Notes (Addendum)
    Progress Note Weekend coverage for Dr. Elnoria Howard  Subjective  Chief Complaint: Status post necrosectomy for infected fluid/WN, severe malnutrition, persistent abdominal pain, shortness of breath  Today, the patient tells me that he felt a lot better pain wise yesterday when he had scheduled Oxycodone.  He did continue to use the Dilaudid doses as well, but overall was a lot more comfortable.  Does tell me he continues to have reflux symptoms.  Per nursing they are unable to flush the patient's port.  They request a chest x-ray ordered.   Objective   Vital signs in last 24 hours: Temp:  [98.2 F (36.8 C)-98.8 F (37.1 C)] 98.4 F (36.9 C) (03/21 0719) Pulse Rate:  [83-91] 91 (03/21 0719) Resp:  [16-18] 16 (03/21 0719) BP: (128-134)/(75-84) 128/75 (03/21 0719) SpO2:  [99 %-100 %] 99 % (03/21 0719) Last BM Date: 07/02/19 General:    AA male in NAD Heart:  Regular rate and rhythm; no murmurs Lungs: Respirations even and unlabored, lungs CTA bilaterally Abdomen:  Soft, moderate left upper quadrant/epigastric TTP and nondistended. Normal bowel sounds. Extremities:  Without edema. Neurologic:  Alert and oriented,  grossly normal neurologically. Psych:  Cooperative. Normal mood and affect.  Intake/Output from previous day: 03/20 0701 - 03/21 0700 In: 960 [P.O.:960] Out: 1700 [Urine:1700] Intake/Output this shift: Total I/O In: -  Out: 650 [Urine:650]  Lab Results: BMET Recent Labs    07/03/19 0452 07/04/19 0329 07/05/19 0556  NA 136 136 136  K 3.9 3.6 4.1  CL 106 104 104  CO2 22 23 24   GLUCOSE 146* 155* 131*  BUN 11 7 9   CREATININE 0.50* 0.52* 0.48*  CALCIUM 8.3* 8.5* 8.4*    Studies/Results: DG CHEST PORT 1 VIEW  Result Date: 07/05/2019 CLINICAL DATA:  PICC line. EXAM: PORTABLE CHEST 1 VIEW COMPARISON:  06/27/2019 FINDINGS: Patient has RIGHT-sided PICC line, tip overlying the level of the superior vena cava. Heart is enlarged and accentuated by AP position of  patient. There is minimal atelectasis in the MEDIAL lung bases. No pneumothorax or consolidation. IMPRESSION: Interval repositioning of RIGHT-sided PICC line. Tip now overlies the superior vena cava. Mild bibasilar atelectasis. Electronically Signed   By: 07/07/2019 M.D.   On: 07/05/2019 09:58     Assessment / Plan:   Assessment: 1.  Status post necrosectomy for infected fluid collections/WON 2.  Severe malnutrition 3.  Persistent abdominal pain 4.  Shortness of breath  Plan: 1.  Continue Sucralfate and Pantoprazole twice daily 2.  Continue TPN with p.o. as tolerated 3.  Continue antibiotics 4.  We will continue Oxycodone 10 mg scheduled every 6 hours for another 24 hours.  Again this can be held if patient is asleep or not in any pain, can continue Dilaudid for breakthrough. 5.  Added Famotidine 40 mg p.o. twice daily and also added Mylanta as needed for continued reflux symptoms. 6.  Please await any further recommendations from Dr. Norva Pavlov.  Dr. 07/07/2019 will resume care tomorrow.    LOS: 8 days   Rhea Belton  07/05/2019, 10:11 AM

## 2019-07-06 LAB — COMPREHENSIVE METABOLIC PANEL
ALT: 7 U/L (ref 0–44)
AST: 10 U/L — ABNORMAL LOW (ref 15–41)
Albumin: 2.1 g/dL — ABNORMAL LOW (ref 3.5–5.0)
Alkaline Phosphatase: 62 U/L (ref 38–126)
Anion gap: 7 (ref 5–15)
BUN: 10 mg/dL (ref 6–20)
CO2: 26 mmol/L (ref 22–32)
Calcium: 8.6 mg/dL — ABNORMAL LOW (ref 8.9–10.3)
Chloride: 104 mmol/L (ref 98–111)
Creatinine, Ser: 0.49 mg/dL — ABNORMAL LOW (ref 0.61–1.24)
GFR calc Af Amer: >60 mL/min (ref >60)
GFR calc non Af Amer: >60 mL/min (ref >60)
Glucose, Bld: 164 mg/dL — ABNORMAL HIGH (ref 70–99)
Potassium: 4.1 mmol/L (ref 3.5–5.1)
Sodium: 137 mmol/L (ref 135–145)
Total Bilirubin: 0.4 mg/dL (ref 0.3–1.2)
Total Protein: 6.2 g/dL — ABNORMAL LOW (ref 6.5–8.1)

## 2019-07-06 LAB — GLUCOSE, CAPILLARY
Glucose-Capillary: 141 mg/dL — ABNORMAL HIGH (ref 70–99)
Glucose-Capillary: 142 mg/dL — ABNORMAL HIGH (ref 70–99)
Glucose-Capillary: 175 mg/dL — ABNORMAL HIGH (ref 70–99)
Glucose-Capillary: 170 mg/dL — ABNORMAL HIGH (ref 70–99)
Glucose-Capillary: 175 mg/dL — ABNORMAL HIGH (ref 70–99)

## 2019-07-06 LAB — DIFFERENTIAL
Abs Immature Granulocytes: 0 10*3/uL (ref 0.00–0.07)
Basophils Absolute: 0 10*3/uL (ref 0.0–0.1)
Basophils Relative: 0 %
Eosinophils Absolute: 0.2 10*3/uL (ref 0.0–0.5)
Eosinophils Relative: 5 %
Immature Granulocytes: 0 %
Lymphocytes Relative: 44 %
Lymphs Abs: 1.5 10*3/uL (ref 0.7–4.0)
Monocytes Absolute: 0.4 10*3/uL (ref 0.1–1.0)
Monocytes Relative: 11 %
Neutro Abs: 1.4 10*3/uL — ABNORMAL LOW (ref 1.7–7.7)
Neutrophils Relative %: 40 %

## 2019-07-06 LAB — PHOSPHORUS: Phosphorus: 3.8 mg/dL (ref 2.5–4.6)

## 2019-07-06 LAB — CBC
HCT: 28.5 % — ABNORMAL LOW (ref 39.0–52.0)
Hemoglobin: 8.5 g/dL — ABNORMAL LOW (ref 13.0–17.0)
MCH: 25.3 pg — ABNORMAL LOW (ref 26.0–34.0)
MCHC: 29.8 g/dL — ABNORMAL LOW (ref 30.0–36.0)
MCV: 84.8 fL (ref 80.0–100.0)
Platelets: 175 10*3/uL (ref 150–400)
RBC: 3.36 MIL/uL — ABNORMAL LOW (ref 4.22–5.81)
RDW: 17.7 % — ABNORMAL HIGH (ref 11.5–15.5)
WBC: 3.4 10*3/uL — ABNORMAL LOW (ref 4.0–10.5)
nRBC: 0 % (ref 0.0–0.2)

## 2019-07-06 LAB — PREALBUMIN: Prealbumin: 13.6 mg/dL — ABNORMAL LOW (ref 18–38)

## 2019-07-06 LAB — TRIGLYCERIDES: Triglycerides: 70 mg/dL (ref <150)

## 2019-07-06 LAB — MAGNESIUM: Magnesium: 1.7 mg/dL (ref 1.7–2.4)

## 2019-07-06 MED ORDER — TRACE MINERALS CU-MN-SE-ZN 300-55-60-3000 MCG/ML IV SOLN
INTRAVENOUS | Status: AC
  Filled 2019-07-06: qty 710.4

## 2019-07-06 MED ORDER — OXYCODONE HCL 5 MG PO TABS
10.0000 mg | ORAL_TABLET | Freq: Four times a day (QID) | ORAL | Status: DC | PRN
  Administered 2019-07-06 – 2019-07-08 (×4): 10 mg via ORAL
  Filled 2019-07-06 (×4): qty 2

## 2019-07-06 NOTE — Progress Notes (Signed)
Brief Advanced Endoscopy Note  Had a chance to review the patient's chart and imaging. I am significantly happy with how the large necroma has improved. The double pigtail stents are probably still allowing there to be air within the cyst-cavity. Drainage of the region in the uncinate can be difficult endoscopically but may need to be considered. It would not be via AXIOS but rather the older cystgastrostomy technique. However, it is not clear to me that the patient should be having significant pain from the way things look currently. Agree with continued CREON but would consider increasing to 72000 units with each meal and 27800 units with each snack. If patient's pain persists even after continued pain management then would consider repeat cross-sectional imaging with IV/PO contrast. The previously placed double pigtail stents should not be causing him pain but can certainly be removed if necessary within this hospitalization or preferentially in a few more weeks. Discussed case with Dr. Loreta Ave. Happy to be available for further help/aid in the coming days now that I am back in town. Dr. Mann/Dr. Elnoria Howard will continue GI care at this time and further recommendations.  Corliss Parish, MD [facility] Advanced Endoscopy Office # [phone]

## 2019-07-06 NOTE — Progress Notes (Signed)
PROGRESS NOTE    Caleb Chambers  WIO:[id number] DOB: 06/29/1965 DOA: 06/27/2019 PCP: Patient, No Pcp Per    Brief Narrative:  Caleb Chambers  Is a 54 year old male with past medical history remarkable for type 2 diabetes mellitus, essential hypertension, morbid obesity, pancreatitis with pseudocyst, gout, and history of noncompliance who presented with intractable nausea/vomiting and associated abdominal pain.  He called his gastroenterology office, who advised him to proceed to the ED for further evaluation.  Recently hospitalized and discharged on 06/22/2019 for DKA, AKI, and acute pancreatitis with pseudocyst.  During the hospitalization he underwent multiple EGDs with cysto gastrostomy tube creation and double pigtail replacements.  Patient also underwent necrosectomy for his acute pancreatitis with necrosis and infected pseudocyst with Staph aureus.  He was initially placed on vancomycin and Flagyl and transition to doxycycline and metronidazole at discharge.  Plan was to continue antibiotics for 2 weeks until repeat CT abdomen /pelvis was performed.  Patient was also on TPN which was reduced to half dose at time of discharge.  In the ED had basic blood work done as well as a CT of the abdomen and pelvis. He was given a liter of normal saline and given pain control with hydromorphone and antiemetics with promethazine. Patient was started the patient on antibiotics with vancomycin, ceftriaxone and Flagyl and ordered blood cultures. I asked the EDP to [place] and she is going to call the gastroenterologist on call for further evaluation.  Assessment & Plan:   Active Problems:   Morbid obesity (HCC)   Abdominal pain, epigastric   Abnormal CT of the abdomen   Protein-calorie malnutrition, severe   Pancreatic necrosis   Infected pancreatic pseudocyst   Intractable nausea and vomiting   Abdominal pain   Diabetes mellitus type 2 in obese (HCC)   Malnutrition of moderate degree   Acute  on chronic pancreatitis with necrosis Infected pseudocyst with MSSA Patient presenting with recurrent nausea/vomiting and abdominal pain. CT abd pelvis notable for two cystgastrostomy catheters decompressiing previous large pancreatic pseudocyst and two small heterogeneous collections persisting not drained by catheters; one adjacent to pancreatic tail and one adjacent to uncinate process.  Gastroenterology discussed case with IR, unable to place drain surrounding uncinate process given proximity to SMA. --GI Dr. Benson Norway following; appreciate assistance --Continue antibiotics with Cefepime and Flagyl --Continue TPN; reduced dose to 75% for abd pain/fullness; pharmacy following for management --GI started scheduled oxycodone 88m PO q6h scheduled, with dilaudid IV for breakthrough pain --Low-fat diet --continue creon with meals --Await further GI recommendations, regarding antibiotic course/duration; further diagnostic/imaging, and timing of disposition  Type 2 diabetes mellitus, poorly controlled Hemoglobin A1c 8.7 06/27/2019; improved from 13.2 04/22/2019.  --continue insulin in TPN, currently 45u regular insulin; management per pharmacy --avoiding basal insulin while on TPN --Continue ISS for further coverage --Diabetic educator consult, given his complex case with TPN and irregular meal consumption  Gout: continue allopurinol 3084mPO daily  Pseudohyponatremia Etiology etiology likely secondary to hyperglycemia, now resolved with improved glycemic control. --monitor BMP daily  Moderate protein calorie malnutrition Prealbumin 13.5. --Nutrition following, appreciate assistance --continue TPN, 75% dose --boost Breeze twice daily --Low fat diet  Nutrition Status: Nutrition Problem: Moderate Malnutrition Etiology: chronic illness(pancreatitis) Signs/Symptoms: percent weight loss, energy intake < or equal to 75% for > or equal to 1 month, mild fat depletion, moderate fat depletion, mild  muscle depletion, moderate muscle depletion Percent weight loss: 13.7 % Interventions: Boost Breeze, TPN  Hypokalemia Repleted during hospitalization.  Potassium 3.9  today. --Continue supplementation per nutrition/TPN --BMP daily  Hypomagnesmia Magnesium 1.8 today, will replete.  GERD: continue PPI  Obesity Body mass index is 38.88 kg/m. discussed need for lifestyle changes /weight reduction as this complicates all facets of care  Normocytic anemia Hemoglobin stable, 7.7  Anxiety: continue xanax prn   DVT prophylaxis: Lovenox Code Status: Full Code Family Communication: Updated patient at bedside  Lines: Right UE PICC: placed 06/28/2019 Disposition Plan:      Patient is from: Home     Anticipated Disposition: Home with Home health RN, TPN     Barriers to discharge or conditions that needs to be met prior to discharge: Decreased reliance on IV pain medicine, sign off by gastroenterology, abdominal pain improved with better toleration of oral intake  Consultants:   GI - Dr. Benson Norway  Procedures:   none  Antimicrobials:   Cefepime 3/15>>  Flagyl 3/13>>  Vancomycin 3/13 - 3/15  Ceftriaxone 3/13 - 3/15    Subjective: Patient seen and examined at bedside.  Sleeping but easily arousable.  Continues with improved abdominal discomfort. No other complaints.  Awaiting further GI recommendations.  Denies headache, no chest pain, no palpitations, no shortness of breath.  Notified by nursing regarding occluded PICC line as per day with PICC line changed to left upper extremity.  No acute concerns overnight per nursing staff.  Objective: Vitals:   07/05/19 0835 07/05/19 1932 07/06/19 0255 07/06/19 0741  BP:  (!) 149/91 120/70 (!) 143/81  Pulse:  88 89 88  Resp:  '18 17 18  ' Temp:  98.6 F (37 C) 98.1 F (36.7 C) 98.6 F (37 C)  TempSrc:  Oral Oral Oral  SpO2:  100% 100% 100%  Weight: 126.5 kg     Height:        Intake/Output Summary (Last 24 hours) at 07/06/2019  0749 Last data filed at 07/06/2019 0617 Gross per 24 hour  Intake 3567.88 ml  Output 650 ml  Net 2917.88 ml   Filed Weights   06/28/19 0457 07/02/19 0500 07/05/19 0835  Weight: 125.3 kg 129.2 kg 126.5 kg    Examination:  General exam: Appears calm and comfortable  Respiratory system: Clear to auscultation. Respiratory effort normal. Cardiovascular system: S1 & S2 heard, RRR. No JVD, murmurs, rubs, gallops or clicks. No pedal edema. Gastrointestinal system: Abdomen is nondistended, soft and mild generalized tenderness. No organomegaly or masses felt. Normal bowel sounds heard. Central nervous system: Alert and oriented. No focal neurological deficits. Extremities: Symmetric 5 x 5 power. Skin: No rashes, lesions or ulcers Psychiatry: Judgement and insight appear normal. Mood & affect appropriate.     Data Reviewed: I have personally reviewed following labs and imaging studies  CBC: Recent Labs  Lab 06/30/19 0251 07/02/19 0328 07/06/19 0621  WBC 4.9 4.4 3.4*  NEUTROABS 2.9  --  1.4*  HGB 7.7* 7.7* 8.5*  HCT 25.1* 26.0* 28.5*  MCV 84.2 84.7 84.8  PLT 211 181 003   Basic Metabolic Panel: Recent Labs  Lab 06/30/19 0251 06/30/19 0251 07/01/19 0438 07/01/19 0438 07/02/19 0328 07/03/19 0452 07/04/19 0329 07/05/19 0556 07/06/19 0621  NA 138   < > 136   < > 135 136 136 136 137  K 3.5   < > 3.6   < > 3.8 3.9 3.6 4.1 4.1  CL 108   < > 107   < > 106 106 104 104 104  CO2 23   < > 20*   < > 21* 22 23 24  26  GLUCOSE 161*   < > 192*   < > 183* 146* 155* 131* 164*  BUN 12   < > 16   < > '14 11 7 9 10  ' CREATININE 0.59*   < > 0.56*   < > 0.60* 0.50* 0.52* 0.48* 0.49*  CALCIUM 8.0*   < > 8.0*   < > 8.1* 8.3* 8.5* 8.4* 8.6*  MG 2.0   < > 1.9  --  1.8 1.7 1.8  --  1.7  PHOS 3.9  --   --   --  2.8  --  3.3  --  3.8   < > = values in this interval not displayed.   GFR: Estimated Creatinine Clearance: 146.4 mL/min (A) (by C-G formula based on SCr of 0.49 mg/dL (L)). Liver  Function Tests: Recent Labs  Lab 06/30/19 0251 07/02/19 0328 07/06/19 0621  AST 11* 9* 10*  ALT '8 7 7  ' ALKPHOS 70 62 62  BILITOT 0.4 <0.1* 0.4  PROT 6.0* 5.7* 6.2*  ALBUMIN 1.9* 2.0* 2.1*   No results for input(s): LIPASE, AMYLASE in the last 168 hours. No results for input(s): AMMONIA in the last 168 hours. Coagulation Profile: No results for input(s): INR, PROTIME in the last 168 hours. Cardiac Enzymes: No results for input(s): CKTOTAL, CKMB, CKMBINDEX, TROPONINI in the last 168 hours. BNP (last 3 results) No results for input(s): PROBNP in the last 8760 hours. HbA1C: No results for input(s): HGBA1C in the last 72 hours. CBG: Recent Labs  Lab 07/05/19 1651 07/05/19 1933 07/05/19 2331 07/06/19 0338 07/06/19 0738  GLUCAP 121* 181* 190* 141* 142*   Lipid Profile: Recent Labs    07/06/19 0621  TRIG 70   Thyroid Function Tests: No results for input(s): TSH, T4TOTAL, FREET4, T3FREE, THYROIDAB in the last 72 hours. Anemia Panel: No results for input(s): VITAMINB12, FOLATE, FERRITIN, TIBC, IRON, RETICCTPCT in the last 72 hours. Sepsis Labs: No results for input(s): PROCALCITON, LATICACIDVEN in the last 168 hours.  Recent Results (from the past 240 hour(s))  SARS CORONAVIRUS 2 (TAT 6-24 HRS) Nasopharyngeal Nasopharyngeal Swab     Status: None   Collection Time: 06/27/19  5:27 PM   Specimen: Nasopharyngeal Swab  Result Value Ref Range Status   SARS Coronavirus 2 NEGATIVE NEGATIVE Final    Comment: (NOTE) SARS-CoV-2 target nucleic acids are NOT DETECTED. The SARS-CoV-2 RNA is generally detectable in upper and lower respiratory specimens during the acute phase of infection. Negative results do not preclude SARS-CoV-2 infection, do not rule out co-infections with other pathogens, and should not be used as the sole basis for treatment or other patient management decisions. Negative results must be combined with clinical observations, patient history, and  epidemiological information. The expected result is Negative. Fact Sheet for Patients: SugarRoll.be Fact Sheet for Healthcare Providers: https://www.woods-mathews.com/ This test is not yet approved or cleared by the [place] FDA and  has been authorized for detection and/or diagnosis of SARS-CoV-2 by FDA under an Emergency Use Authorization (EUA). This EUA will remain  in effect (meaning this test can be used) for the duration of the COVID-19 declaration under Section 56 4(b)(1) of the Act, 21 U.S.C. section 360bbb-3(b)(1), unless the authorization is terminated or revoked sooner. Performed at [facility], [place] [address] [place], [place] [address]   Culture, blood (routine x 2)     Status: None   Collection Time: 06/27/19 11:35 PM   Specimen: BLOOD  Result Value Ref Range Status  Specimen Description BLOOD RIGHT HAND  Final   Special Requests   Final    BOTTLES DRAWN AEROBIC AND ANAEROBIC Blood Culture adequate volume   Culture   Final    NO GROWTH 5 DAYS Performed at [facility], [address] [place], [place] [address]    Report Status 07/03/2019 FINAL  Final  Culture, blood (routine x 2)     Status: None   Collection Time: 06/27/19 11:37 PM   Specimen: BLOOD  Result Value Ref Range Status   Specimen Description BLOOD RIGHT FOREARM  Final   Special Requests   Final    BOTTLES DRAWN AEROBIC AND ANAEROBIC Blood Culture adequate volume   Culture   Final    NO GROWTH 5 DAYS Performed at [facility], [place] [address], [place], [place] [address]    Report Status 07/03/2019 FINAL  Final  Urine culture     Status: None   Collection Time: 06/28/19  5:12 AM   Specimen: Urine, Clean Catch  Result Value Ref Range Status   Specimen Description URINE, CLEAN CATCH  Final   Special Requests NONE  Final   Culture   Final    NO GROWTH Performed at [facility], [place] [address], [place], [place] [address]     Report Status 06/29/2019 FINAL  Final  GI pathogen panel by PCR, stool     Status: None   Collection Time: 06/29/19 12:38 PM   Specimen: Stool  Result Value Ref Range Status   Plesiomonas shigelloides NOT DETECTED NOT DETECTED Final   Yersinia enterocolitica NOT DETECTED NOT DETECTED Final   Vibrio NOT DETECTED NOT DETECTED Final   Enteropathogenic E coli NOT DETECTED NOT DETECTED Final   E coli (ETEC) LT/ST NOT DETECTED NOT DETECTED Final   E coli 3154 by PCR Not applicable NOT DETECTED Final   Cryptosporidium by PCR NOT DETECTED NOT DETECTED Final   Entamoeba histolytica NOT DETECTED NOT DETECTED Final   Adenovirus F 40/41 NOT DETECTED NOT DETECTED Final   Norovirus GI/GII NOT DETECTED NOT DETECTED Final   Sapovirus NOT DETECTED NOT DETECTED Final    Comment: (NOTE) Performed At: [facility] [place], [place] [id number] Rush Farmer MD KD:[id number]    Vibrio cholerae NOT DETECTED NOT DETECTED Final   Campylobacter by PCR NOT DETECTED NOT DETECTED Final   Salmonella by PCR NOT DETECTED NOT DETECTED Final   E coli (STEC) NOT DETECTED NOT DETECTED Final   Enteroaggregative E coli NOT DETECTED NOT DETECTED Final   Shigella by PCR NOT DETECTED NOT DETECTED Final   Cyclospora cayetanensis NOT DETECTED NOT DETECTED Final   Astrovirus NOT DETECTED NOT DETECTED Final   G lamblia by PCR NOT DETECTED NOT DETECTED Final   Rotavirus A by PCR NOT DETECTED NOT DETECTED Final  C difficile quick scan w PCR reflex     Status: None   Collection Time: 06/29/19 12:38 PM   Specimen: Stool  Result Value Ref Range Status   C Diff antigen NEGATIVE NEGATIVE Final   C Diff toxin NEGATIVE NEGATIVE Final   C Diff interpretation No C. difficile detected.  Final    Comment: Performed at [facility], [place] [address] [place], [place] [address]         Radiology Studies: DG CHEST PORT 1 VIEW  Result Date: 07/05/2019 CLINICAL DATA:  PICC line. EXAM: PORTABLE CHEST 1  VIEW COMPARISON:  06/27/2019 FINDINGS: Patient has RIGHT-sided PICC line,  tip overlying the level of the superior vena cava. Heart is enlarged and accentuated by AP position of patient. There is minimal atelectasis in the MEDIAL lung bases. No pneumothorax or consolidation. IMPRESSION: Interval repositioning of RIGHT-sided PICC line. Tip now overlies the superior vena cava. Mild bibasilar atelectasis. Electronically Signed   By: Nolon Nations M.D.   On: 07/05/2019 09:58   [place] EKG SITE RITE  Result Date: 07/05/2019 If Site Rite image not attached, placement could not be confirmed due to current cardiac rhythm.       Scheduled Meds: . allopurinol  300 mg Oral Daily  . Chlorhexidine Gluconate Cloth  6 each Topical Daily  . dicyclomine  20 mg Oral TID AC & HS  . enoxaparin (LOVENOX) injection  0.5 mg/kg Subcutaneous Q24H  . famotidine  40 mg Oral BID  . feeding supplement (ENSURE ENLIVE)  237 mL Oral BID BM  . hydrocortisone cream   Topical BID  . insulin aspart  0-15 Units Subcutaneous Q4H  . lipase/protease/amylase  36,000 Units Oral TID AC  . pantoprazole  40 mg Oral BID  . sodium chloride flush  10-40 mL Intracatheter Q12H  . sodium chloride flush  10-40 mL Intracatheter Q12H  . sucralfate  1 g Oral TID WC & HS   Continuous Infusions: . sodium chloride 10 mL/hr at 07/05/19 1729  . ceFEPime (MAXIPIME) IV 2 g (07/06/19 0641)  . metronidazole 500 mg (07/06/19 0254)     LOS: 9 days    Time spent: 35 minutes spent on chart review, discussion with nursing staff, consultants, updating family and interview/physical exam; more than 50% of that time was spent in counseling and/or coordination of care.    Maliah Pyles J [place] DO [facility] Available via Epic secure chat 7am-7pm After these hours, please refer to coverage provider listed on [email or web address] 07/06/2019, 7:49 AM

## 2019-07-06 NOTE — Plan of Care (Signed)
  Problem: Health Behavior/Discharge Planning: Goal: Ability to manage health-related needs will improve Outcome: Progressing   

## 2019-07-06 NOTE — Progress Notes (Signed)
PHARMACY - TOTAL PARENTERAL NUTRITION CONSULT NOTE  Indication:  Infected pancreatic pseudocyst  Patient Measurements: Height: 5\' 11"  (180.3 cm) Weight: 276 lb 3.8 oz (125.3 kg) IBW/kg (Calculated) : 75.3 TPN AdjBW (KG): 88.5 Body mass index is 38.53 kg/m.  Assessment:  53 YOM on chronic TPN presenting 06/27/19 with N/V/D and 10/10 abdominal pain. PMH significant for uncontrolled DM2, gout, HTN, pancreatitis and history of noncompliance. Patient recently discharged 06/22/19 after admission for DKA, AKI, and acute pancreatitis with infected pseudocyst, s/p multiple EGD, necrosectomy procedures with cyst gastrostomy. Patient was discharged on doxy and metronidazole. TPN was reduced during previous hospitalization to 1/2 dose on discharge to supplement PO diet. Antibiotics have been broadened and GI consulted. Patient reports no missed days of TPN or toleration issues PTA, last 3/13.  Glucose / Insulin: A1c 13.2 > 8.7%. CBGs improved, 141-181. Required 45 units of insulin in TPN + 13 units moderate SSI last 24 hrs (amt of SSI dependent on PO intake).  Electrolytes:  Lytes ok - K 4.1, Mg 1.7, Phos 3.8.  Renal: SCr 0.49 (stable) and BUN WNL LFTs / TGs: LFTs / tbili / TG WNL Prealbumin / albumin: pre-albumin 13.6, albumin 2.1 Intake / Output; MIVF: UOP 0.4 ml/kg/hr + 3 unmeasured occurances.  3/21 LBM. Sucralfate added. Continues on Protonix BID.  GI Imaging: 3/13 CT abd/pelvis: residual persistent collections Surgeries / Procedures: IR to drain uncinate fluid collection if able  Central access: PICC placed 06/05/19 TPN start date: chronic TPN PTA; 3/14 inpatient  Current TPN Rx [facility]): -15% Amino Acid 71 g/day, Dextrose 154 g/day, Lipids 36 g/day - provides 1168 kcal/day -Electrolytes:  Ca gluconate 5 meq, Mag sulfate 19.2 meq, Potassium Acetate 0 meq, Potassium Cl 5 meq, K Phosphate 10 mmol, Sodium Acetate 0 meq, Sodium Cl 77 meq, Sodium Phosphate 0 mmol -Additives:  Trace elements 1 mL, MVI 10  mL, Regular insulin 45 units -Administer over 12 hrs with 1 hr ramp up and 1 hr ramp down  Nutritional Goals (per RD rec on 3/16): 2350-2550 kCal, 140-155g AA, >2.3L fluid per day  Current Nutrition:  TPN  Regular diet - consumed 100% of breakfast yesterday with improved pain control and 25 % of one additional meal.  Ensure Enlive po BID (each supplement provides 350 kcal and 20g protein) - none charted as consumed.  Plan:  Per discussion with Dr. Benson Norway and Dr. [place] 3/19: ok to reduce TPN to 75% of goal. Patient in agreement and Pam, RN at [facility] updated 3/19.   Continue custom concentrated cyclic TPN to ~96%:  infuse 1440 mls over 12 hrs: 65 mL/hr x 1 hr, then 131 mL/hr x 10 hrs, then 65 mL/hr x 1 hr.  TPN provides 107g AA, 245g CHO, 54g ILE for total 1798 kCal, meeting 75% of patient needs. Electrolytes in TPN: 50 mEq/L Na, 65 mEq/L K, 2.72mEq/L Ca, 15 mEq/L Mag, and 5 mmol/L Phos. Adjust Cl:Ac to 1:2 Daily trace element and folate in TPN, MWF multivitamin d/t national shortage  Continue moderate SSI Q4H. Continue regular insulin in TPN at 45 units.  F/U AM labs, I/O's, CBGs  *DM Coordinator recommending patient to receive Novolog 3 units TID with meals by injection when TPN is off during the day. AVOID basal insulin while on cyclic TPN due to risk of lows during TPN off periods. Will eventually need transitioned back to basal insulin when TPN is weaned off.   Alanda Slim, PharmD, [facility] Clinical Pharmacist Please see AMION for all Pharmacists' Contact Phone  Numbers 07/06/2019, 9:13 AM

## 2019-07-07 LAB — GLUCOSE, CAPILLARY
Glucose-Capillary: 131 mg/dL — ABNORMAL HIGH (ref 70–99)
Glucose-Capillary: 133 mg/dL — ABNORMAL HIGH (ref 70–99)
Glucose-Capillary: 140 mg/dL — ABNORMAL HIGH (ref 70–99)
Glucose-Capillary: 161 mg/dL — ABNORMAL HIGH (ref 70–99)
Glucose-Capillary: 173 mg/dL — ABNORMAL HIGH (ref 70–99)
Glucose-Capillary: 177 mg/dL — ABNORMAL HIGH (ref 70–99)

## 2019-07-07 MED ORDER — PANCRELIPASE (LIP-PROT-AMYL) 36000-114000 UNITS PO CPEP
72000.0000 [IU] | ORAL_CAPSULE | Freq: Three times a day (TID) | ORAL | Status: DC
  Administered 2019-07-07 – 2019-07-08 (×6): 72000 [IU] via ORAL
  Filled 2019-07-07 (×9): qty 2

## 2019-07-07 MED ORDER — TRACE MINERALS CU-MN-SE-ZN 300-55-60-3000 MCG/ML IV SOLN
INTRAVENOUS | Status: AC
  Filled 2019-07-07: qty 710.4

## 2019-07-07 NOTE — Progress Notes (Signed)
Subjective: No new complaints.  Feeling well with the current pain control.  Objective: Vital signs in last 24 hours: Temp:  [98.6 F (37 C)-98.8 F (37.1 C)] 98.7 F (37.1 C) (03/23 0408) Pulse Rate:  [88] 88 (03/23 0408) Resp:  [17-18] 18 (03/23 0408) BP: (127-143)/(76-81) 140/76 (03/23 0408) SpO2:  [99 %-100 %] 99 % (03/23 0408) Weight:  [131.4 kg] 131.4 kg (03/23 0408) Last BM Date: 07/05/19  Intake/Output from previous day: 03/22 0701 - 03/23 0700 In: 2385.9 [P.O.:840; I.V.:1245.9; IV Piggyback:300] Out: 1750 [Urine:1750] Intake/Output this shift: No intake/output data recorded.  General appearance: alert and no distress GI: soft, non-tender; bowel sounds normal; no masses,  no organomegaly  Lab Results: Recent Labs    07/06/19 0621  WBC 3.4*  HGB 8.5*  HCT 28.5*  PLT 175   BMET Recent Labs    07/05/19 0556 07/06/19 0621  NA 136 137  K 4.1 4.1  CL 104 104  CO2 24 26  GLUCOSE 131* 164*  BUN 9 10  CREATININE 0.48* 0.49*  CALCIUM 8.4* 8.6*   LFT Recent Labs    07/06/19 0621  PROT 6.2*  ALBUMIN 2.1*  AST 10*  ALT 7  ALKPHOS 62  BILITOT 0.4   PT/INR No results for input(s): LABPROT, INR in the last 72 hours. Hepatitis Panel No results for input(s): HEPBSAG, HCVAB, HEPAIGM, HEPBIGM in the last 72 hours. C-Diff No results for input(s): CDIFFTOX in the last 72 hours. Fecal Lactopherrin No results for input(s): FECLLACTOFRN in the last 72 hours.  Studies/Results: DG CHEST PORT 1 VIEW  Result Date: 07/05/2019 CLINICAL DATA:  PICC line. EXAM: PORTABLE CHEST 1 VIEW COMPARISON:  06/27/2019 FINDINGS: Patient has RIGHT-sided PICC line, tip overlying the level of the superior vena cava. Heart is enlarged and accentuated by AP position of patient. There is minimal atelectasis in the MEDIAL lung bases. No pneumothorax or consolidation. IMPRESSION: Interval repositioning of RIGHT-sided PICC line. Tip now overlies the superior vena cava. Mild bibasilar  atelectasis. Electronically Signed   By: Nolon Nations M.D.   On: 07/05/2019 09:58   [place] EKG SITE RITE  Result Date: 07/05/2019 If Site Rite image not attached, placement could not be confirmed due to current cardiac rhythm.   Medications:  Scheduled: . allopurinol  300 mg Oral Daily  . Chlorhexidine Gluconate Cloth  6 each Topical Daily  . dicyclomine  20 mg Oral TID AC & HS  . enoxaparin (LOVENOX) injection  0.5 mg/kg Subcutaneous Q24H  . famotidine  40 mg Oral BID  . feeding supplement (ENSURE ENLIVE)  237 mL Oral BID BM  . hydrocortisone cream   Topical BID  . insulin aspart  0-15 Units Subcutaneous Q4H  . lipase/protease/amylase  72,000 Units Oral TID AC  . pantoprazole  40 mg Oral BID  . sodium chloride flush  10-40 mL Intracatheter Q12H  . sodium chloride flush  10-40 mL Intracatheter Q12H  . sucralfate  1 g Oral TID WC & HS   Continuous: . sodium chloride 10 mL/hr at 07/05/19 1729  . ceFEPime (MAXIPIME) IV 2 g (07/07/19 0513)  . metronidazole 500 mg (07/07/19 0051)    Assessment/Plan: 1) S/p necrosectomy. 2) Severe malnutrition.   The patient is stable and his pain is under control.  At this time he is able to be discharged home.  Dr. Donneta Romberg input is greatly appreciated.  The patient will follow up in the office next Monday and he will maintain home TPN.  He can maintain  his antibiotics for now.  LOS: 10 days   Peyton Rossner D 07/07/2019, 7:09 AM

## 2019-07-07 NOTE — Progress Notes (Signed)
Nutrition Follow-up  DOCUMENTATION CODES:   Non-severe (moderate) malnutrition in context of chronic illness  INTERVENTION:   -D/c Ensure Enlive po BID, each supplement provides 350 kcal and 20 grams of protein -TPN management per pharmacy  NUTRITION DIAGNOSIS:   Moderate Malnutrition related to chronic illness(pancreatitis) as evidenced by percent weight loss, energy intake < or equal to 75% for > or equal to 1 month, mild fat depletion, moderate fat depletion, mild muscle depletion, moderate muscle depletion.  Ongoing  GOAL:   Patient will meet greater than or equal to 90% of their needs  Progressing   MONITOR:   PO intake, Supplement acceptance, Diet advancement, Labs, Weight trends, Skin, I & O's  REASON FOR ASSESSMENT:   Consult New TPN/TNA  ASSESSMENT:   Caleb Chambers is a 54 y.o. male with medical history significant for but not limited history of uncontrolled diabetes mellitus type 2 with a history of DKA, gout, hypertension, morbid obesity, pancreatitis, history of noncompliance who was recently just discharged from the hospital on 06/22/2018 where is found and admitted for diabetic ketoacidosis, AKI and acute pancreatitis with pseudocyst.  During that hospitalization he underwent multiple EGDs with cystogastrostomy tube creation and double-pigtail replacements.  He underwent multiple EGDs as well as necrosectomy treated for his acute pancreatitis with necrosis and infected pseudocyst with staph aureus.  He was placed on vancomycin and Flagyl and transition to doxycycline metronidazole at discharge.  Plan was for him to new antibiotics until repeat CT of the abdomen and pelvis in 2 weeks.  Patient was on TPN during that hospitalization and it was reduced to half the dose.  He now presents again diarrhea, nausea, vomiting and 10 out of 10 abdominal pain which was initially dull and stabbing and now is just throbbing.  Patient states that he woke up with diarrhea around 2 AM  and had multiple episodes.  Tried to maintain his fluid balance and started vomiting and started having abdominal pain.  Was unable to keep anything down.  He called the gastroenterology office and he was advised to come to the ED for further evaluation recommendations.  In the ED he had a CT of the abdomen pelvis done and he had some abdominal pain that was radiating to the left.  Repeat CT of the abdomen pelvis was done and showed that there is still some residual persistent collections that were definitely not drained by the catheters adjacent to the uncinate process.  Patient clinically is not septic appearing but is complaining of significant abdominal pain.  Because of his uncontrolled symptoms he will be brought into the hospital for further evaluation and will be consulted for further recommendations.  3/19- transitioned to cyclic TPN at 79% of needed  Visited pt in room. He was sleeping soundly and did not arouse to voice.   Pt with erratic intake. Noted meal completion 10-100%. Pt is refusing Ensure supplements.   Pt continues to receive TPN (cyclic TPN, which provides around 75% of estimated kcal and protein needs)- infuse 1440 mls over 12 hrs: 65 mL/hr x 1 hr, then 131 mL/hr x 10 hrs, then 65 mL/hr x 1 hr. Regimen provides 1798 kcals and 107 grams protein.   Labs reviewed: CBGS: 131-173 (inpatient orders for glycemic control are 0-15 units insulin aspart every 4 hours).   Diet Order:   Diet Order            Diet heart healthy/carb modified Room service appropriate? Yes; Fluid consistency: Thin  Diet effective now  EDUCATION NEEDS:   Education needs have been addressed  Skin:  Skin Assessment: Reviewed RN Assessment  Last BM:  07/07/19  Height:   Ht Readings from Last 1 Encounters:  06/27/19 5\' 11"  (1.803 m)    Weight:   Wt Readings from Last 1 Encounters:  07/07/19 131.4 kg    Ideal Body Weight:  78.2 kg  BMI:  Body mass index is 40.4  kg/m.  Estimated Nutritional Needs:   Kcal:  07/09/19  Protein:  140-155 grams  Fluid:  > 2.3 L    [id number], RD, LDN, CDCES Registered Dietitian II Certified Diabetes Care and Education Specialist Please refer to [facility] for RD and/or RD on-call/weekend/after hours pager

## 2019-07-07 NOTE — Progress Notes (Addendum)
PROGRESS NOTE    Caleb Chambers  [id number]:[id number] DOB: Nov 28, 1965 DOA: 06/27/2019 PCP: Patient, No Pcp Per    Brief Narrative:  Caleb Chambers  Is a 53 year old male with past medical history remarkable for type 2 diabetes mellitus, essential hypertension, morbid obesity, pancreatitis with pseudocyst, gout, and history of noncompliance who presented with intractable nausea/vomiting and associated abdominal pain.  He called his gastroenterology office, who advised him to proceed to the ED for further evaluation.  Recently hospitalized and discharged on 06/22/2019 for DKA, AKI, and acute pancreatitis with pseudocyst.  During the hospitalization he underwent multiple EGDs with cysto gastrostomy tube creation and double pigtail replacements.  Patient also underwent necrosectomy for his acute pancreatitis with necrosis and infected pseudocyst with Staph aureus.  He was initially placed on vancomycin and Flagyl and transition to doxycycline and metronidazole at discharge.  Plan was to continue antibiotics for 2 weeks until repeat CT abdomen /pelvis was performed.  Patient was also on TPN which was reduced to half dose at time of discharge.  In the ED had basic blood work done as well as a CT of the abdomen and pelvis. He was given a liter of normal saline and given pain control with hydromorphone and antiemetics with promethazine. Patient was started the patient on antibiotics with vancomycin, ceftriaxone and Flagyl and ordered blood cultures. I asked the EDP to [place] and she is going to call the gastroenterologist on call for further evaluation.  Assessment & Plan:   Active Problems:   Morbid obesity (HCC)   Abdominal pain, epigastric   Abnormal CT of the abdomen   Protein-calorie malnutrition, severe   Pancreatic necrosis   Infected pancreatic pseudocyst   Intractable nausea and vomiting   Abdominal pain   Diabetes mellitus type 2 in obese (HCC)   Malnutrition of moderate degree   Acute  on chronic pancreatitis with necrosis Infected pseudocyst with MSSA Patient presenting with recurrent nausea/vomiting and abdominal pain. CT abd pelvis notable for two cystgastrostomy catheters decompressiing previous large pancreatic pseudocyst and two small heterogeneous collections persisting not drained by catheters; one adjacent to pancreatic tail and one adjacent to uncinate process.  Gastroenterology discussed case with IR, unable to place drain surrounding uncinate process given proximity to SMA. --GI Dr. Benson Chambers following; appreciate assistance --Continue antibiotics with Cefepime and Flagyl --Continue TPN; reduced dose to 75% for abd pain/fullness; pharmacy following for management --GI started scheduled oxycodone 37m PO q6h scheduled, with dilaudid IV for breakthrough pain --Low-fat diet --increased Creon to 72,000u with meals today --Await further GI recommendations, regarding antibiotic course/duration (note today only states to continue current antibiotics)  ADDENDUM 1427: Discussed case with GI, Dr. HBenson Chambers  Okay to discharge on oral antibiotics with TPN.  Has scheduled followed with GI on Monday.  Will plan discharge home on cefdinir for additional 14 days.  Plan discharge on 07/08/2019.  Case management updated to ensure her home health services are in place for continued TPN starting tomorrow.  Type 2 diabetes mellitus, poorly controlled Hemoglobin A1c 8.7 06/27/2019; improved from 13.2 04/22/2019.  --continue insulin in TPN, currently 45u regular insulin; management per pharmacy --avoiding basal insulin while on TPN --Continue ISS for further coverage --Diabetic educator consult, given his complex case with TPN and irregular meal consumption  Gout: continue allopurinol 3039mPO daily  Pseudohyponatremia Etiology etiology likely secondary to hyperglycemia, now resolved with improved glycemic control. --monitor BMP daily  Moderate protein calorie malnutrition Prealbumin  13.5. --Nutrition following, appreciate assistance --continue TPN, 75%  dose --boost Breeze twice daily --Low fat diet  Nutrition Status: Nutrition Problem: Moderate Malnutrition Etiology: chronic illness(pancreatitis) Signs/Symptoms: percent weight loss, energy intake < or equal to 75% for > or equal to 1 month, mild fat depletion, moderate fat depletion, mild muscle depletion, moderate muscle depletion Percent weight loss: 13.7 % Interventions: Boost Breeze, TPN  Hypokalemia Repleted during hospitalization.  Potassium 3.9 today. --Continue supplementation per nutrition/TPN --BMP daily  Hypomagnesmia Magnesium 1.8 today, will replete.  GERD: continue PPI  Obesity Body mass index is 40.4 kg/m. discussed need for lifestyle changes /weight reduction as this complicates all facets of care  Normocytic anemia Hemoglobin stable, 7.7  Anxiety: continue xanax prn   DVT prophylaxis: Lovenox Code Status: Full Code Family Communication: Updated patient at bedside  Lines: Right UE PICC: placed 07/06/2019 Disposition Plan:      Patient is from: Home     Anticipated Disposition: Home with Home health RN, TPN; likely 3/24 once GI clarifies duration of antibiotics     Barriers to discharge or conditions that needs to be met prior to discharge: Decreased reliance on IV pain medicine  Consultants:   GI - Dr. Benson Chambers  Procedures:   none  Antimicrobials:   Cefepime 3/15>>  Flagyl 3/13>>  Vancomycin 3/13 - 3/15  Ceftriaxone 3/13 - 3/15    Subjective: Patient seen and examined at bedside.  Continues with improved abdominal discomfort, states worse after meals.  Will increase Creon to 72,000 units 3 times daily AC.  No other complaints.  GI recommends to continue antibiotics outpatient, but does not indicate duration.  Denies headache, no chest pain, no palpitations, no shortness of breath. No acute concerns overnight per nursing staff.  Objective: Vitals:   07/06/19 0741  07/06/19 1926 07/07/19 0408 07/07/19 0818  BP: (!) 143/81 127/77 140/76 128/71  Pulse: 88 88 88 83  Resp: '18 17 18   ' Temp: 98.6 F (37 C) 98.8 F (37.1 C) 98.7 F (37.1 C) 98.7 F (37.1 C)  TempSrc: Oral Oral Oral Oral  SpO2: 100% 100% 99% 99%  Weight:   131.4 kg   Height:        Intake/Output Summary (Last 24 hours) at 07/07/2019 0827 Last data filed at 07/07/2019 0819 Gross per 24 hour  Intake 2385.85 ml  Output 1150 ml  Net 1235.85 ml   Filed Weights   07/02/19 0500 07/05/19 0835 07/07/19 0408  Weight: 129.2 kg 126.5 kg 131.4 kg    Examination:  General exam: Appears calm and comfortable  Respiratory system: Clear to auscultation. Respiratory effort normal. Cardiovascular system: S1 & S2 heard, RRR. No JVD, murmurs, rubs, gallops or clicks. No pedal edema. Gastrointestinal system: Abdomen is nondistended, soft and mild generalized tenderness. No organomegaly or masses felt. Normal bowel sounds heard. Central nervous system: Alert and oriented. No focal neurological deficits. Extremities: Symmetric 5 x 5 power. Skin: No rashes, lesions or ulcers Psychiatry: Judgement and insight appear normal. Mood & affect appropriate.     Data Reviewed: I have personally reviewed following labs and imaging studies  CBC: Recent Labs  Lab 07/02/19 0328 07/06/19 0621  WBC 4.4 3.4*  NEUTROABS  --  1.4*  HGB 7.7* 8.5*  HCT 26.0* 28.5*  MCV 84.7 84.8  PLT 181 169   Basic Metabolic Panel: Recent Labs  Lab 07/01/19 0438 07/01/19 0438 07/02/19 0328 07/03/19 0452 07/04/19 0329 07/05/19 0556 07/06/19 0621  NA 136   < > 135 136 136 136 137  K 3.6   < >  3.8 3.9 3.6 4.1 4.1  CL 107   < > 106 106 104 104 104  CO2 20*   < > 21* '22 23 24 26  ' GLUCOSE 192*   < > 183* 146* 155* 131* 164*  BUN 16   < > '14 11 7 9 10  ' CREATININE 0.56*   < > 0.60* 0.50* 0.52* 0.48* 0.49*  CALCIUM 8.0*   < > 8.1* 8.3* 8.5* 8.4* 8.6*  MG 1.9  --  1.8 1.7 1.8  --  1.7  PHOS  --   --  2.8  --  3.3  --   3.8   < > = values in this interval not displayed.   GFR: Estimated Creatinine Clearance: 149.3 mL/min (A) (by C-G formula based on SCr of 0.49 mg/dL (L)). Liver Function Tests: Recent Labs  Lab 07/02/19 0328 07/06/19 0621  AST 9* 10*  ALT 7 7  ALKPHOS 62 62  BILITOT <0.1* 0.4  PROT 5.7* 6.2*  ALBUMIN 2.0* 2.1*   No results for input(s): LIPASE, AMYLASE in the last 168 hours. No results for input(s): AMMONIA in the last 168 hours. Coagulation Profile: No results for input(s): INR, PROTIME in the last 168 hours. Cardiac Enzymes: No results for input(s): CKTOTAL, CKMB, CKMBINDEX, TROPONINI in the last 168 hours. BNP (last 3 results) No results for input(s): PROBNP in the last 8760 hours. HbA1C: No results for input(s): HGBA1C in the last 72 hours. CBG: Recent Labs  Lab 07/06/19 1619 07/06/19 1926 07/07/19 0008 07/07/19 0404 07/07/19 0809  GLUCAP 170* 175* 161* 133* 140*   Lipid Profile: Recent Labs    07/06/19 0621  TRIG 70   Thyroid Function Tests: No results for input(s): TSH, T4TOTAL, FREET4, T3FREE, THYROIDAB in the last 72 hours. Anemia Panel: No results for input(s): VITAMINB12, FOLATE, FERRITIN, TIBC, IRON, RETICCTPCT in the last 72 hours. Sepsis Labs: No results for input(s): PROCALCITON, LATICACIDVEN in the last 168 hours.  Recent Results (from the past 240 hour(s))  SARS CORONAVIRUS 2 (TAT 6-24 HRS) Nasopharyngeal Nasopharyngeal Swab     Status: None   Collection Time: 06/27/19  5:27 PM   Specimen: Nasopharyngeal Swab  Result Value Ref Range Status   SARS Coronavirus 2 NEGATIVE NEGATIVE Final    Comment: (NOTE) SARS-CoV-2 target nucleic acids are NOT DETECTED. The SARS-CoV-2 RNA is generally detectable in upper and lower respiratory specimens during the acute phase of infection. Negative results do not preclude SARS-CoV-2 infection, do not rule out co-infections with other pathogens, and should not be used as the sole basis for treatment or other  patient management decisions. Negative results must be combined with clinical observations, patient history, and epidemiological information. The expected result is Negative. Fact Sheet for Patients: SugarRoll.be Fact Sheet for Healthcare Providers: https://www.woods-mathews.com/ This test is not yet approved or cleared by the [place] FDA and  has been authorized for detection and/or diagnosis of SARS-CoV-2 by FDA under an Emergency Use Authorization (EUA). This EUA will remain  in effect (meaning this test can be used) for the duration of the COVID-19 declaration under Section 56 4(b)(1) of the Act, 21 U.S.C. section 360bbb-3(b)(1), unless the authorization is terminated or revoked sooner. Performed at [facility], [place] [address], [place], [place] [address]   Culture, blood (routine x 2)     Status: None   Collection Time: 06/27/19 11:35 PM   Specimen: BLOOD  Result Value Ref Range Status   Specimen Description BLOOD RIGHT HAND  Final  Special Requests   Final    BOTTLES DRAWN AEROBIC AND ANAEROBIC Blood Culture adequate volume   Culture   Final    NO GROWTH 5 DAYS Performed at [facility], [place] [address], [place], [place] [address]    Report Status 07/03/2019 FINAL  Final  Culture, blood (routine x 2)     Status: None   Collection Time: 06/27/19 11:37 PM   Specimen: BLOOD  Result Value Ref Range Status   Specimen Description BLOOD RIGHT FOREARM  Final   Special Requests   Final    BOTTLES DRAWN AEROBIC AND ANAEROBIC Blood Culture adequate volume   Culture   Final    NO GROWTH 5 DAYS Performed at [facility], [place] [address], [place], [place] [address]    Report Status 07/03/2019 FINAL  Final  Urine culture     Status: None   Collection Time: 06/28/19  5:12 AM   Specimen: Urine, Clean Catch  Result Value Ref Range Status   Specimen Description URINE, CLEAN CATCH  Final   Special Requests NONE  Final    Culture   Final    NO GROWTH Performed at [facility], [place] [address], [place], [place] [address]    Report Status 06/29/2019 FINAL  Final  GI pathogen panel by PCR, stool     Status: None   Collection Time: 06/29/19 12:38 PM   Specimen: Stool  Result Value Ref Range Status   Plesiomonas shigelloides NOT DETECTED NOT DETECTED Final   Yersinia enterocolitica NOT DETECTED NOT DETECTED Final   Vibrio NOT DETECTED NOT DETECTED Final   Enteropathogenic E coli NOT DETECTED NOT DETECTED Final   E coli (ETEC) LT/ST NOT DETECTED NOT DETECTED Final   E coli 6629 by PCR Not applicable NOT DETECTED Final   Cryptosporidium by PCR NOT DETECTED NOT DETECTED Final   Entamoeba histolytica NOT DETECTED NOT DETECTED Final   Adenovirus F 40/41 NOT DETECTED NOT DETECTED Final   Norovirus GI/GII NOT DETECTED NOT DETECTED Final   Sapovirus NOT DETECTED NOT DETECTED Final    Comment: (NOTE) Performed At: [facility] [place], [place] [id number] Rush Farmer MD TW:[id number]    Vibrio cholerae NOT DETECTED NOT DETECTED Final   Campylobacter by PCR NOT DETECTED NOT DETECTED Final   Salmonella by PCR NOT DETECTED NOT DETECTED Final   E coli (STEC) NOT DETECTED NOT DETECTED Final   Enteroaggregative E coli NOT DETECTED NOT DETECTED Final   Shigella by PCR NOT DETECTED NOT DETECTED Final   Cyclospora cayetanensis NOT DETECTED NOT DETECTED Final   Astrovirus NOT DETECTED NOT DETECTED Final   G lamblia by PCR NOT DETECTED NOT DETECTED Final   Rotavirus A by PCR NOT DETECTED NOT DETECTED Final  C difficile quick scan w PCR reflex     Status: None   Collection Time: 06/29/19 12:38 PM   Specimen: Stool  Result Value Ref Range Status   C Diff antigen NEGATIVE NEGATIVE Final   C Diff toxin NEGATIVE NEGATIVE Final   C Diff interpretation No C. difficile detected.  Final    Comment: Performed at [facility], [place] [address] [place], [place] [address]          Radiology Studies: DG CHEST PORT 1 VIEW  Result Date: 07/05/2019 CLINICAL DATA:  PICC line. EXAM: PORTABLE CHEST 1 VIEW COMPARISON:  06/27/2019 FINDINGS: Patient has RIGHT-sided PICC line, tip overlying the level of the superior vena cava.  Heart is enlarged and accentuated by AP position of patient. There is minimal atelectasis in the MEDIAL lung bases. No pneumothorax or consolidation. IMPRESSION: Interval repositioning of RIGHT-sided PICC line. Tip now overlies the superior vena cava. Mild bibasilar atelectasis. Electronically Signed   By: Nolon Nations M.D.   On: 07/05/2019 09:58   [place] EKG SITE RITE  Result Date: 07/05/2019 If Site Rite image not attached, placement could not be confirmed due to current cardiac rhythm.       Scheduled Meds: . allopurinol  300 mg Oral Daily  . Chlorhexidine Gluconate Cloth  6 each Topical Daily  . dicyclomine  20 mg Oral TID AC & HS  . enoxaparin (LOVENOX) injection  0.5 mg/kg Subcutaneous Q24H  . famotidine  40 mg Oral BID  . feeding supplement (ENSURE ENLIVE)  237 mL Oral BID BM  . hydrocortisone cream   Topical BID  . insulin aspart  0-15 Units Subcutaneous Q4H  . lipase/protease/amylase  72,000 Units Oral TID AC  . pantoprazole  40 mg Oral BID  . sodium chloride flush  10-40 mL Intracatheter Q12H  . sodium chloride flush  10-40 mL Intracatheter Q12H  . sucralfate  1 g Oral TID WC & HS   Continuous Infusions: . sodium chloride 10 mL/hr at 07/05/19 1729  . ceFEPime (MAXIPIME) IV 2 g (07/07/19 0513)  . metronidazole 500 mg (07/07/19 0051)     LOS: 10 days    Time spent: 34 minutes spent on chart review, discussion with nursing staff, consultants, updating family and interview/physical exam; more than 50% of that time was spent in counseling and/or coordination of care.    Sofiah Lyne J [place] DO [facility] Available via Epic secure chat 7am-7pm After these hours, please refer to coverage provider listed on  [email or web address] 07/07/2019, 8:27 AM

## 2019-07-07 NOTE — Plan of Care (Signed)
  Problem: Health Behavior/Discharge Planning: Goal: Ability to manage health-related needs will improve Outcome: Progressing   

## 2019-07-07 NOTE — Plan of Care (Signed)
  Problem: Clinical Measurements: Goal: Ability to maintain clinical measurements within normal limits will improve Outcome: Progressing   Problem: Pain Managment: Goal: General experience of comfort will improve Outcome: Progressing   Problem: Nutrition: Goal: Adequate nutrition will be maintained Outcome: Not Progressing   

## 2019-07-07 NOTE — Progress Notes (Addendum)
PHARMACY - TOTAL PARENTERAL NUTRITION CONSULT NOTE  Indication:  Infected pancreatic pseudocyst  Patient Measurements: Height: 5\' 11"  (180.3 cm) Weight: 276 lb 3.8 oz (125.3 kg) IBW/kg (Calculated) : 75.3 TPN AdjBW (KG): 88.5 Body mass index is 38.53 kg/m.  Assessment:  53 YOM on chronic TPN presenting 06/27/19 with N/V/D and 10/10 abdominal pain. PMH significant for uncontrolled DM2, gout, HTN, pancreatitis and history of noncompliance. Patient recently discharged 06/22/19 after admission for DKA, AKI, and acute pancreatitis with infected pseudocyst, s/p multiple EGD, necrosectomy procedures with cyst gastrostomy. Patient was discharged on doxy and metronidazole. TPN was reduced during previous hospitalization to 1/2 dose on discharge to supplement PO diet. Antibiotics have been broadened and GI consulted. Patient reports no missed days of TPN or toleration issues PTA, last 3/13.  Glucose / Insulin: A1c 13.2 > 8.7%. CBGs improved, 133-175. Required 45 units of insulin in TPN + 16 units moderate SSI last 24 hrs (amt of SSI dependent on PO intake).  Electrolytes:  No new labs 3/23 - Lytes ok - K 4.1, Mg 1.7, Phos 3.8.  Renal: SCr 0.49 (stable) and BUN WNL LFTs / TGs: LFTs / tbili / TG WNL Prealbumin / albumin: pre-albumin 13.6, albumin 2.1 Intake / Output; MIVF: UOP 0.6 ml/kg/hr + 2 unmeasured occurances.  3/21 LBM. Sucralfate added. Continues on Protonix BID.  GI Imaging: 3/13 CT abd/pelvis: residual persistent collections Surgeries / Procedures: IR to drain uncinate fluid collection if able  Central access: PICC placed 06/05/19 TPN start date: chronic TPN PTA; 3/14 inpatient  Current TPN Rx [facility]): -15% Amino Acid 71 g/day, Dextrose 154 g/day, Lipids 36 g/day - provides 1168 kcal/day -Electrolytes:  Ca gluconate 5 meq, Mag sulfate 19.2 meq, Potassium Acetate 0 meq, Potassium Cl 5 meq, K Phosphate 10 mmol, Sodium Acetate 0 meq, Sodium Cl 77 meq, Sodium Phosphate 0 mmol -Additives:  Trace  elements 1 mL, MVI 10 mL, Regular insulin 45 units -Administer over 12 hrs with 1 hr ramp up and 1 hr ramp down  Nutritional Goals (per RD rec on 3/16): 2350-2550 kCal, 140-155g AA, >2.3L fluid per day  Current Nutrition:  TPN  Regular diet - consumed 100% of breakfast yesterday with improved pain control and 25 % of one additional meal.  Ensure Enlive po BID (each supplement provides 350 kcal and 20g protein) - none charted as consumed.  Plan:  Per discussion with Dr. Benson [place] and Dr. [place] 3/19: ok to reduce TPN to 75% of goal. Patient in agreement and Pam, RN at [facility] updated 3/19.   Continue custom concentrated cyclic TPN to ~63%:  infuse 1440 mls over 12 hrs: 65 mL/hr x 1 hr, then 131 mL/hr x 10 hrs, then 65 mL/hr x 1 hr.  TPN provides 107g AA, 245g CHO, 54g ILE for total 1798 kCal, meeting 75% of patient needs. Electrolytes in TPN: 50 mEq/L Na, 65 mEq/L K, 2.2mEq/L Ca, 15 mEq/L Mag, and 5 mmol/L Phos. Adjust Cl:Ac to 1:2 Daily trace element and folate in TPN, MWF multivitamin d/t national shortage  Continue moderate SSI Q4H. Continue regular insulin in TPN at 45 units.  F/U Mon/Thurs labs, I/O's, CBGs F/U discharge - current plan to discharge Wednesday once GI defines ABX duration  *DM Coordinator recommending patient to receive Novolog 3 units TID with meals by injection when TPN is off during the day. AVOID basal insulin while on cyclic TPN due to risk of lows during TPN off periods. Will eventually need transitioned back to basal insulin when TPN is  weaned off.   Jeanella Cara, PharmD, [facility] Clinical Pharmacist Please see AMION for all Pharmacists' Contact Phone Numbers 07/07/2019, 9:03 AM

## 2019-07-07 NOTE — TOC Progression Note (Signed)
Transition of Care [facility]) - Progression Note    Patient Details  Name: Caleb Chambers MRN: [id number] Date of Birth: Aug 31, 1965  Transition of Care [facility]) CM/SW Contact  Janae Bridgeman, RN Phone Number: 07/07/2019, 2:29 PM  Clinical Narrative:    Spoke with Dr. Eric [place] regarding needs for transition to home planned for 07/08/19.  New TPN orders to be placed by Dr. [place] along with reinstatement of Home Health RN needs for IV line maintenance.  Called Jeri Modena, RN with [facility] and notified her and [facility] about resuming home health services starting after patient's discharge home.   Expected Discharge Plan: Home w Home Health Services Barriers to Discharge: Barriers Resolved  Expected Discharge Plan and Services Expected Discharge Plan: Home w Home Health Services   Discharge Planning Services: CM Consult   Living arrangements for the past 2 months: Single Family Home                 DME Arranged: Glucometer(Hospitalist called in Glucometer this morning to pharmacy - electronic script)           HH Agency: [facility] infusion - Jeri Modena) Date [facility] Contacted: 07/02/19([address] Filter at bedside) Time [facility] Agency Contacted: [id number] Representative spoke with at [facility] Agency: Jeri Modena, [facility] infusion will arrange for [facility] to increase visits to M,Th for added antibiotic/TPN infusion continues   Social Determinants of Health (SDOH) Interventions    Readmission Risk Interventions Readmission Risk Prevention Plan 07/02/2019 06/09/2019  Transportation Screening Complete Complete  PCP or Specialist Appt within 3-5 Days Complete -  HRI or Home Care Consult Complete -  Social Work Consult for Recovery Care Planning/Counseling Complete -  Palliative Care Screening Complete -  Medication Review [facility]) Complete Complete  SW Recovery Care/Counseling Consult - Complete  Palliative Care Screening - Not Applicable  Skilled Nursing  Facility - Patient Refused  Some recent data might be hidden

## 2019-07-08 DIAGNOSIS — R197 Diarrhea, unspecified: Secondary | ICD-10-CM

## 2019-07-08 LAB — GLUCOSE, CAPILLARY
Glucose-Capillary: 147 mg/dL — ABNORMAL HIGH (ref 70–99)
Glucose-Capillary: 163 mg/dL — ABNORMAL HIGH (ref 70–99)
Glucose-Capillary: 168 mg/dL — ABNORMAL HIGH (ref 70–99)
Glucose-Capillary: 170 mg/dL — ABNORMAL HIGH (ref 70–99)
Glucose-Capillary: 179 mg/dL — ABNORMAL HIGH (ref 70–99)
Glucose-Capillary: 193 mg/dL — ABNORMAL HIGH (ref 70–99)
Glucose-Capillary: 199 mg/dL — ABNORMAL HIGH (ref 70–99)

## 2019-07-08 MED ORDER — OXYCODONE HCL 5 MG PO TABS
15.0000 mg | ORAL_TABLET | ORAL | Status: DC | PRN
  Administered 2019-07-08 – 2019-07-09 (×2): 15 mg via ORAL
  Filled 2019-07-08 (×4): qty 3

## 2019-07-08 MED ORDER — OXYCODONE HCL 5 MG PO TABS
5.0000 mg | ORAL_TABLET | Freq: Once | ORAL | Status: AC
  Administered 2019-07-08: 5 mg via ORAL
  Filled 2019-07-08: qty 1

## 2019-07-08 MED ORDER — LIVING WELL WITH DIABETES BOOK
Freq: Once | Status: AC
  Filled 2019-07-08: qty 1

## 2019-07-08 MED ORDER — TRACE MINERALS CU-MN-SE-ZN 300-55-60-3000 MCG/ML IV SOLN
INTRAVENOUS | Status: AC
  Filled 2019-07-08: qty 710.4

## 2019-07-08 MED ORDER — PANCRELIPASE (LIP-PROT-AMYL) 36000-114000 UNITS PO CPEP
36000.0000 [IU] | ORAL_CAPSULE | Freq: Two times a day (BID) | ORAL | Status: DC | PRN
  Filled 2019-07-08: qty 1

## 2019-07-08 NOTE — Progress Notes (Signed)
PHARMACY - TOTAL PARENTERAL NUTRITION CONSULT NOTE  Indication:  Infected pancreatic pseudocyst  Patient Measurements: Height: 5\' 11"  (180.3 cm) Weight: 276 lb 3.8 oz (125.3 kg) IBW/kg (Calculated) : 75.3 TPN AdjBW (KG): 88.5 Body mass index is 38.53 kg/m.  Assessment:  53 YOM on chronic TPN presenting 06/27/19 with N/V/D and 10/10 abdominal pain. PMH significant for uncontrolled DM2, gout, HTN, pancreatitis and history of noncompliance. Patient recently discharged 06/22/19 after admission for DKA, AKI, and acute pancreatitis with infected pseudocyst, s/p multiple EGD, necrosectomy procedures with cyst gastrostomy. Patient was discharged on doxy and metronidazole. TPN was reduced during previous hospitalization to 1/2 dose on discharge to supplement PO diet. Antibiotics have been broadened and GI consulted. Patient reports no missed days of TPN or toleration issues PTA, last 3/13.  Glucose / Insulin: A1c 13.2 > 8.7%. CBGs improved, 133-175. Required 45 units of insulin in TPN + 16 units moderate SSI last 24 hrs (amt of SSI dependent on PO intake).  Electrolytes:  Labs from 3/22 - Lytes ok - K 4.1, Mg 1.7, Phos 3.8.  Renal: SCr 0.49 (stable) and BUN WNL LFTs / TGs: LFTs / tbili / TG WNL Prealbumin / albumin: pre-albumin 13.6, albumin 2.1 Intake / Output; MIVF: UOP 0.6 ml/kg/hr + 2 unmeasured occurances.  3/21 LBM. Sucralfate added. Continues on Protonix BID.  GI Imaging: 3/13 CT abd/pelvis: residual persistent collections Surgeries / Procedures: IR to drain uncinate fluid collection if able  Central access: PICC placed 06/05/19 TPN start date: chronic TPN PTA; 3/14 inpatient  Current TPN Rx [facility]): -15% Amino Acid 71 g/day, Dextrose 154 g/day, Lipids 36 g/day - provides 1168 kcal/day -Electrolytes:  Ca gluconate 5 meq, Mag sulfate 19.2 meq, Potassium Acetate 0 meq, Potassium Cl 5 meq, K Phosphate 10 mmol, Sodium Acetate 0 meq, Sodium Cl 77 meq, Sodium Phosphate 0 mmol -Additives:  Trace  elements 1 mL, MVI 10 mL, Regular insulin 45 units -Administer over 12 hrs with 1 hr ramp up and 1 hr ramp down  Nutritional Goals (per RD rec on 3/23): 2350-2550 kCal, 140-155g AA, >2.3L fluid per day  Current Nutrition:  TPN  Regular diet - consumed 100% of breakfast yesterday with improved pain control and 25 % of one additional meal.  Ensure Enlive po BID (each supplement provides 350 kcal and 20g protein) - none charted as consumed; patient is refusing  Plan:  Per discussion with Dr. Benson [place] and Dr. [place] 3/19: ok to reduce TPN to 75% of goal. Patient in agreement and Pam, RN at [facility] updated 3/19. Ronnell [place], Pharmacist on 3/24.  Plan for discharge 3/25 per Dr. Florene Glen.  Home Health order for TPN per [facility], [facility], [facility] in place.   Continue custom concentrated cyclic TPN to ~08%:  infuse 1440 mls over 12 hrs: 65 mL/hr x 1 hr, then 131 mL/hr x 10 hrs, then 65 mL/hr x 1 hr.  TPN provides 107g AA, 245g CHO, 54g ILE for total 1798 kCal, meeting 75% of patient needs. Electrolytes in TPN: 50 mEq/L Na, 65 mEq/L K, 2.60mEq/L Ca, 15 mEq/L Mag, and 5 mmol/L Phos. Adjust Cl:Ac to 1:2 Daily trace element and folate in TPN, MWF multivitamin d/t national shortage  Continue moderate SSI Q4H. Continue regular insulin in TPN at 45 units.  F/U Mon/Thurs labs, I/O's, CBGs. Will recheck folate with tomorrow labs.  F/U discharge - current plan to discharge Wednesday once GI defines ABX duration  *DM Coordinator recommending patient to receive Novolog 3 units TID with meals by injection when TPN  is off during the day. AVOID basal insulin while on cyclic TPN due to risk of lows during TPN off periods. Will eventually need transitioned back to basal insulin when TPN is weaned off.   Link Snuffer, PharmD, BCPS, BCCCP Clinical Pharmacist Please refer to [facility] for [facility] Pharmacy numbers 07/08/2019, 9:08 AM

## 2019-07-08 NOTE — Progress Notes (Signed)
PROGRESS NOTE    [name]  JKD:[id number] DOB: 02-Apr-1966 DOA: 06/27/2019 PCP: Patient, No Pcp Per   Brief Narrative:  Caleb Chambers  Is Caleb Chambers 54 year old male with past medical history remarkable for type 2 diabetes mellitus, essential hypertension, morbid obesity, pancreatitis with pseudocyst, gout, and history of noncompliance who presented with intractable nausea/vomiting and associated abdominal pain.  He called his gastroenterology office, who advised him to proceed to the ED for further evaluation.  Recently hospitalized and discharged on 06/22/2019 for DKA, AKI, and acute pancreatitis with pseudocyst.  During the hospitalization he underwent multiple EGDs with cysto gastrostomy tube creation and double pigtail replacements.  Patient also underwent necrosectomy for his acute pancreatitis with necrosis and infected pseudocyst with Staph aureus.  He was initially placed on vancomycin and Flagyl and transition to doxycycline and metronidazole at discharge.  Plan was to continue antibiotics for 2 weeks until repeat CT abdomen /pelvis was performed.  Patient was also on TPN which was reduced to half dose at time of discharge.  In the ED had basic blood work done as well as Calhoun Reichardt CT of the abdomen and pelvis. He was given Ashaunti Treptow liter of normal saline and given pain control with hydromorphone and antiemetics with promethazine. Patient was started the patient on antibiotics with vancomycin, ceftriaxone and Flagyl and ordered blood cultures. I asked the EDP to [place] and she is going to call the gastroenterologist on call for further evaluation.  Assessment & Plan:   Active Problems:   Morbid obesity (HCC)   Abdominal pain, epigastric   Abnormal CT of the abdomen   Protein-calorie malnutrition, severe   Pancreatic necrosis   Infected pancreatic pseudocyst   Intractable nausea and vomiting   Abdominal pain   Diabetes mellitus type 2 in obese (HCC)   Malnutrition of moderate degree   Acute  on chronic pancreatitis with necrosis Infected pseudocyst with MSSA Patient presenting with recurrent nausea/vomiting and abdominal pain. CT abd pelvis notable for two cystgastrostomy catheters decompressiing previous large pancreatic pseudocyst and two small heterogeneous collections persisting not drained by catheters; one adjacent to pancreatic tail and one adjacent to uncinate process.  Gastroenterology discussed case with IR, unable to place drain surrounding uncinate process given proximity to SMA. --GI Dr. Benson [place] following; appreciate assistance -> per 3/23 note, ok for discharge --still c/o significant pain, will adjust pain meds and follow (he'd been getting oxycodone with dilaudid - discussed need to find appropriate dose off IV abx) --Continue antibiotics with Cefepime and Flagyl --Continue TPN; reduced dose to 75% for abd pain/fullness; pharmacy following for management --Low-fat diet --increased Creon to 72,000u with meals and 36,000 with snacks per Dr. Donneta Romberg note --Plan for d/c 3/25 AM with plan for cefdinir x14 days - will continue to work on pain management today   Type 2 diabetes mellitus, poorly controlled Hemoglobin A1c 8.7 06/27/2019; improved from 13.2 04/22/2019.  --continue insulin in TPN, currently 45u regular insulin; management per pharmacy -- discharge with 3 units TID novolog with meals when TPN off during the day --avoiding basal insulin while on TPN --Continue ISS for further coverage --Diabetic educator consult, given his complex case with TPN and irregular meal consumption  Gout: continue allopurinol 36m PO daily  Pseudohyponatremia Etiology etiology likely secondary to hyperglycemia, now resolved with improved glycemic control. --monitor BMP daily  Moderate protein calorie malnutrition Prealbumin 13.5. --Nutrition following, appreciate assistance --continue TPN, 75% dose --boost Breeze twice daily --Low fat diet  Nutrition Status: Nutrition  Problem: Moderate **Note De-Identified vi Obfusction** Mlnutrition Etiology: chronic illness(pncretitis) Signs/Symptoms: percent weight loss, energy intke < or equl to 75% for > or equl to 1 month, mild ft depletion, moderte ft depletion, mild muscle depletion, moderte muscle depletion Percent weight loss: 13.7 % Interventions: Boost Breeze, TPN  Hypoklemi Repleted during hospitliztion.  Potssium 3.9 tody. --Continue supplementtion per nutrition/TPN --BMP dily  Hypomgnesmi Mgnesium 1.8 tody, will replete.  GERD: continue PPI  Obesity Body mss index is 40.4 kg/m. discussed need for lifestyle chnges /weight reduction s this complictes ll fcets of cre  Normocytic nemi Hemoglobin stble, 7.7  Anxiety: continue xnx prn  DVT prophylxis: lovenox Code Sttus: full Fmily Communiction: none t bedside Disposition Pln:  . Ptient cme from: home            . Anticipted d/c plce: home . Brriers to d/c OR conditions which need to be met to effect  sfe d/c: pending improvement in pin control, hopeful for dischrge 3/25  Consultnts:   GI  Procedures:   none  Antimicrobils:  Anti-infectives (From dmission, onwrd)   Strt     Dose/Rte Route Frequency Ordered Stop   06/29/19 1200  ceFEPIme (MAXIPIME) 2 g in sodium chloride 0.9 % 100 mL IVPB     2 g 200 mL/hr over 30 Minutes Intrvenous Every 8 hours 06/29/19 1103     06/27/19 1830  cefTRIAXone (ROCEPHIN) 2 g in sodium chloride 0.9 % 100 mL IVPB  Sttus:  Discontinued     2 g 200 mL/hr over 30 Minutes Intrvenous Every 24 hours 06/27/19 1823 06/29/19 1103   06/27/19 1830  vncomycin (VANCOREADY) IVPB 1500 mg/300 mL  Sttus:  Discontinued     1,500 mg 150 mL/hr over 120 Minutes Intrvenous Every 12 hours 06/27/19 1823 06/29/19 1103   06/27/19 1745  metroNIDAZOLE (FLAGYL) IVPB 500 mg     500 mg 100 mL/hr over 60 Minutes Intrvenous Every 8 hours 06/27/19 1744       Subjective: C/o pin Notes pin improved with  oxycodone nd diludid together, doesn't notice much diff with orl meds  Objective: Vitls:   07/07/19 1935 07/08/19 0505 07/08/19 0755 07/08/19 1447  BP: 125/64 120/62 129/75 129/69  Pulse: 80 80 83 80  Resp: _0 Temp: 98.6 F (37 C) 98.4 F (36.9 C) 98.3 F (36.8 C) 98.7 F (37.1 C)  TempSrc: Orl Orl Orl Orl  SpO2: 99% 100% 99% 100%  Weight:      Height:        Intke/Output Summry (Lst 24 hours) t 07/08/2019 1645 Lst dt filed t 07/08/2019 1300 Gross per 24 hour  Intke 800 ml  Output 1500 ml  Net -700 ml   Filed Weights   07/02/19 0500 07/05/19 0835 07/07/19 0408  Weight: 129.2 kg 126.5 kg 131.4 kg    Exmintion:  Generl exm: Appers clm nd comfortble  Respirtory system: Cler to usculttion. Respirtory effort norml. Crdiovsculr system: S1 & S2 herd, RRR. Gstrointestinl system: Abdomen is protubernt, soft nd TTP in L qudrnts.  Centrl nervous system: Alert nd oriented. No focl neurologicl deficits. Extremities: no LEE Skin: No rshes, lesions or ulcers Psychitry: Judgement nd insight pper norml. Mood & ffect pproprite.     Dt Reviewed: I hve personlly reviewed following lbs nd imging studies  CBC: Recent Lbs  Lb 07/02/19 0328 07/06/19 0621  WBC 4.4 3.4*  NEUTROABS  --  1.4*  HGB 7.7* 8.5*  HCT 26.0* 28.5*  MCV 84.7 84.8  PLT 181 175  Basic Metabolic Panel: Recent Labs  Lab 07/02/19 0328 07/03/19 0452 07/04/19 0329 07/05/19 0556 07/06/19 0621  N 135 136 136 136 137  K 3.8 3.9 3.6 4.1 4.1  CL 106 106 104 104 104  CO2 21* _0 GLUCOSE 183* 146* 155* 131* 164*  BUN _1 CRETININE 0.60* 0.50* 0.52* 0.48* 0.49*  CLCIUM 8.1* 8.3* 8.5* 8.4* 8.6*  MG 1.8 1.7 1.8  --  1.7  PHOS 2.8  --  3.3  --  3.8   GFR: Estimated Creatinine Clearance: 149.3 mL/min () (by C-G formula based on SCr of 0.49 mg/dL (L)). Liver Function Tests: Recent Labs  Lab 07/02/19 0328  07/06/19 0621  ST 9* 10*  LT 7 7  LKPHOS 62 62  BILITOT <0.1* 0.4  PROT 5.7* 6.2*  LBUMIN 2.0* 2.1*   No results for input(s): LIPSE, MYLSE in the last 168 hours. No results for input(s): MMONI in the last 168 hours. Coagulation Profile: No results for input(s): INR, PROTIME in the last 168 hours. Cardiac Enzymes: No results for input(s): CKTOTL, CKMB, CKMBINDEX, TROPONINI in the last 168 hours. BNP (last 3 results) No results for input(s): PROBNP in the last 8760 hours. Hb1C: No results for input(s): HGB1C in the last 72 hours. CBG: Recent Labs  Lab 07/07/19 2356 07/08/19 0427 07/08/19 0753 07/08/19 1118 07/08/19 1630  GLUCP 193* 170* 179* 163* 147*   Lipid Profile: Recent Labs    07/06/19 0621  TRIG 70   Thyroid Function Tests: No results for input(s): TSH, T4TOTL, FREET4, T3FREE, THYROIDB in the last 72 hours. nemia Panel: No results for input(s): VITMINB12, FOLTE, FERRITIN, TIBC, IRON, RETICCTPCT in the last 72 hours. Sepsis Labs: No results for input(s): PROCLCITON, LTICCIDVEN in the last 168 hours.  Recent Results (from the past 240 hour(s))  GI pathogen panel by PCR, stool     Status: None   Collection Time: 06/29/19 12:38 PM   Specimen: Stool  Result Value Ref Range Status   Plesiomonas shigelloides NOT DETECTED NOT DETECTED Final   Yersinia enterocolitica NOT DETECTED NOT DETECTED Final   Vibrio NOT DETECTED NOT DETECTED Final   Enteropathogenic E coli NOT DETECTED NOT DETECTED Final   E coli (ETEC) LT/ST NOT DETECTED NOT DETECTED Final   E coli 1157 by PCR Not applicable NOT DETECTED Final   Cryptosporidium by PCR NOT DETECTED NOT DETECTED Final   Entamoeba histolytica NOT DETECTED NOT DETECTED Final   denovirus F 40/41 NOT DETECTED NOT DETECTED Final   Norovirus GI/GII NOT DETECTED NOT DETECTED Final   Sapovirus NOT DETECTED NOT DETECTED Final    Comment: (NOTE) Performed t: [facility] [place],  [place] [id number] Rush Farmer MD CB:[id number]    Vibrio cholerae NOT DETECTED NOT DETECTED Final   Campylobacter by PCR NOT DETECTED NOT DETECTED Final   Salmonella by PCR NOT DETECTED NOT DETECTED Final   E coli (STEC) NOT DETECTED NOT DETECTED Final   Enteroaggregative E coli NOT DETECTED NOT DETECTED Final   Shigella by PCR NOT DETECTED NOT DETECTED Final   Cyclospora cayetanensis NOT DETECTED NOT DETECTED Final   strovirus NOT DETECTED NOT DETECTED Final   G lamblia by PCR NOT DETECTED NOT DETECTED Final   Rotavirus  by PCR NOT DETECTED NOT DETECTED Final  C difficile quick scan w PCR reflex     Status: None   Collection Time: 06/29/19 12:38 PM   Specimen: Stool  Result Value  Ref Range Status   C Diff antigen NEGATIVE NEGATIVE Final   C Diff toxin NEGATIVE NEGATIVE Final   C Diff interpretation No C. difficile detected.  Final    Comment: Performed at [facility], [place] [address], [place], [place] [address]         Radiology Studies: No results found.      Scheduled Meds: . allopurinol  300 mg Oral Daily  . Chlorhexidine Gluconate Cloth  6 each Topical Daily  . dicyclomine  20 mg Oral TID AC & HS  . enoxaparin (LOVENOX) injection  0.5 mg/kg Subcutaneous Q24H  . famotidine  40 mg Oral BID  . hydrocortisone cream   Topical BID  . insulin aspart  0-15 Units Subcutaneous Q4H  . lipase/protease/amylase  72,000 Units Oral TID AC  . pantoprazole  40 mg Oral BID  . sodium chloride flush  10-40 mL Intracatheter Q12H  . sodium chloride flush  10-40 mL Intracatheter Q12H  . sucralfate  1 g Oral TID WC & HS   Continuous Infusions: . sodium chloride 10 mL/hr at 07/05/19 1729  . ceFEPime (MAXIPIME) IV 2 g (07/08/19 1355)  . metronidazole 500 mg (07/08/19 0953)  . TPN CYCLIC-ADULT (ION)       LOS: 11 days    Time spent: over 30 min    Fayrene Helper, MD [facility]   To contact the attending provider between 7A-7P or the covering provider during  after hours 7P-7A, please log into the web site [email or web address] and access using universal [place] password for that web site. If you do not have the password, please call the hospital operator.  07/08/2019, 4:45 PM

## 2019-07-09 ENCOUNTER — Inpatient Hospital Stay (HOSPITAL_COMMUNITY): Payer: BC Managed Care – PPO

## 2019-07-09 DIAGNOSIS — R609 Edema, unspecified: Secondary | ICD-10-CM

## 2019-07-09 LAB — COMPREHENSIVE METABOLIC PANEL
ALT: 9 U/L (ref 0–44)
AST: 12 U/L — ABNORMAL LOW (ref 15–41)
Albumin: 2.2 g/dL — ABNORMAL LOW (ref 3.5–5.0)
Alkaline Phosphatase: 65 U/L (ref 38–126)
Anion gap: 7 (ref 5–15)
BUN: 9 mg/dL (ref 6–20)
CO2: 24 mmol/L (ref 22–32)
Calcium: 8.5 mg/dL — ABNORMAL LOW (ref 8.9–10.3)
Chloride: 104 mmol/L (ref 98–111)
Creatinine, Ser: 0.45 mg/dL — ABNORMAL LOW (ref 0.61–1.24)
GFR calc Af Amer: >60 mL/min (ref >60)
GFR calc non Af Amer: >60 mL/min (ref >60)
Glucose, Bld: 143 mg/dL — ABNORMAL HIGH (ref 70–99)
Potassium: 3.9 mmol/L (ref 3.5–5.1)
Sodium: 135 mmol/L (ref 135–145)
Total Bilirubin: 0.3 mg/dL (ref 0.3–1.2)
Total Protein: 6.5 g/dL (ref 6.5–8.1)

## 2019-07-09 LAB — CBC WITH DIFFERENTIAL/PLATELET
Abs Immature Granulocytes: 0.01 10*3/uL (ref 0.00–0.07)
Basophils Absolute: 0 10*3/uL (ref 0.0–0.1)
Basophils Relative: 0 %
Eosinophils Absolute: 0.2 10*3/uL (ref 0.0–0.5)
Eosinophils Relative: 4 %
HCT: 30.3 % — ABNORMAL LOW (ref 39.0–52.0)
Hemoglobin: 9.1 g/dL — ABNORMAL LOW (ref 13.0–17.0)
Immature Granulocytes: 0 %
Lymphocytes Relative: 41 %
Lymphs Abs: 1.4 10*3/uL (ref 0.7–4.0)
MCH: 25.5 pg — ABNORMAL LOW (ref 26.0–34.0)
MCHC: 30 g/dL (ref 30.0–36.0)
MCV: 84.9 fL (ref 80.0–100.0)
Monocytes Absolute: 0.3 10*3/uL (ref 0.1–1.0)
Monocytes Relative: 9 %
Neutro Abs: 1.5 10*3/uL — ABNORMAL LOW (ref 1.7–7.7)
Neutrophils Relative %: 46 %
Platelets: 170 10*3/uL (ref 150–400)
RBC: 3.57 MIL/uL — ABNORMAL LOW (ref 4.22–5.81)
RDW: 17.2 % — ABNORMAL HIGH (ref 11.5–15.5)
WBC: 3.4 10*3/uL — ABNORMAL LOW (ref 4.0–10.5)
nRBC: 0 % (ref 0.0–0.2)

## 2019-07-09 LAB — MAGNESIUM: Magnesium: 1.6 mg/dL — ABNORMAL LOW (ref 1.7–2.4)

## 2019-07-09 LAB — FOLATE: Folate: 14.2 ng/mL (ref >5.9)

## 2019-07-09 LAB — GLUCOSE, CAPILLARY
Glucose-Capillary: 263 mg/dL — ABNORMAL HIGH (ref 70–99)
Glucose-Capillary: 126 mg/dL — ABNORMAL HIGH (ref 70–99)
Glucose-Capillary: 158 mg/dL — ABNORMAL HIGH (ref 70–99)

## 2019-07-09 LAB — PHOSPHORUS: Phosphorus: 3.7 mg/dL (ref 2.5–4.6)

## 2019-07-09 MED ORDER — HEPARIN SOD (PORK) LOCK FLUSH 100 UNIT/ML IV SOLN
250.0000 [IU] | INTRAVENOUS | Status: AC | PRN
  Administered 2019-07-09: 250 [IU]
  Filled 2019-07-09: qty 2.5

## 2019-07-09 MED ORDER — OXYCODONE HCL 10 MG PO TABS: 10.0000 mg | ORAL_TABLET | Freq: Four times a day (QID) | ORAL | 0 refills | Status: AC | PRN

## 2019-07-09 MED ORDER — INSULIN ASPART 100 UNIT/ML FLEXPEN: 3.0000 [IU] | PEN_INJECTOR | Freq: Three times a day (TID) | SUBCUTANEOUS | 0 refills | Status: AC

## 2019-07-09 MED ORDER — PANCRELIPASE (LIP-PROT-AMYL) 36000-114000 UNITS PO CPEP: 72000.0000 [IU] | ORAL_CAPSULE | Freq: Three times a day (TID) | ORAL | 0 refills | Status: AC

## 2019-07-09 MED ORDER — MAGNESIUM SULFATE 2 GM/50ML IV SOLN
2.0000 g | Freq: Once | INTRAVENOUS | Status: AC
  Administered 2019-07-09: 2 g via INTRAVENOUS
  Filled 2019-07-09: qty 50

## 2019-07-09 MED ORDER — CEFDINIR 300 MG PO CAPS: 600.0000 mg | ORAL_CAPSULE | Freq: Every day | ORAL | 0 refills | Status: AC

## 2019-07-09 MED FILL — oxyCODONE HCL 10 MG TABS: 10 | 5 days supply | Qty: 20 | Fill #0

## 2019-07-09 MED FILL — CEFDINIR 300 MG CAPSULE: 300 | 14 days supply | Qty: 28 | Fill #0

## 2019-07-09 MED FILL — CREON DR 12,000 UNITS CAP: 12000 | 20 days supply | Qty: 360 | Fill #0

## 2019-07-09 NOTE — Plan of Care (Signed)

## 2019-07-09 NOTE — Discharge Summary (Signed)
Physician Discharge Summary  Caleb Chambers DJT:[id number] DOB: 09-06-1965 DOA: 06/27/2019  PCP: Caleb Chambers, No Pcp Per  Admit date: 06/27/2019 Discharge date: 07/09/2019  Time spent: 40 minutes  Recommendations for Outpatient Follow-up:  1. Follow outpatient CBC/CMP 2. Follow pain control outpatient 3. Follow BG's outpatient - getting insulin in TPN and mealtime insulin (mealtime insulin was prescribed on discharge, but appears he had this prescribed at discharge at the last hospitalization and this was not filled by pharmacy) 4. Follow antibiotic regimen outpatient - discharged with 14 days cefdinir 5. Follow BP outpatient, continue lisinopril, he's not taking amlodipine, d/c this from med list   Discharge Diagnoses:  Active Problems:   Morbid obesity (HCC)   Abdominal pain, epigastric   Abnormal CT of the abdomen   Protein-calorie malnutrition, severe   Pancreatic necrosis   Infected pancreatic pseudocyst   Nausea vomiting and diarrhea   Abdominal pain   Diabetes mellitus type 2 in obese (HCC)   Malnutrition of moderate degree  Discharge Condition: stable  Diet recommendation: heart healthy/carb mod  Filed Weights   07/02/19 0500 07/05/19 0835 07/07/19 0408  Weight: 129.2 kg 126.5 kg 131.4 kg   History of present illness:  Caleb Chambers Caleb Chambers 54 year old male with past medical history remarkable for type 2 diabetes mellitus, essential hypertension, morbid obesity, pancreatitis with pseudocyst, gout, and history of noncompliance who presented with intractable nausea/vomiting and associated abdominal pain. He called his gastroenterology office, who advised him to proceed to the ED for further evaluation.  Recently hospitalized and discharged on 06/22/2019 for DKA, AKI, and acute pancreatitis with pseudocyst. During the hospitalization he underwent multiple EGDs with cysto gastrostomy tube creation and double pigtail replacements. Caleb Chambers also underwent necrosectomy for his  acute pancreatitis with necrosis and infected pseudocyst with Staph aureus. He was initially placed on vancomycin and Flagyl and transition to doxycycline and metronidazole at discharge. Plan was to continue antibiotics for 2 weeks until repeat CT abdomen /pelvis was performed. Caleb Chambers was also on TPN which was reduced to half dose at time of discharge.  In the ED had basic blood work done as well as Eugean Arnott CT of the abdomen and pelvis. He was given Jahnyla Parrillo liter of normal saline and given pain control with hydromorphone and antiemetics with promethazine. Caleb Chambers was started the Caleb Chambers on antibiotics with vancomycin, ceftriaxone and Flagyl and ordered blood cultures. I asked the EDP to [place] and she is going to call the gastroenterologist on call for further evaluation  He was seen by gastroenterology who discussed case with IR and noted given proximity to SMA, drain unable to be placed.  Supportive care with TPN, IVF, antibiotics, and pain control.  Dr. Rush Landmark recommended increase creon, consider repeat imaging.  Double pigtail stents can be removed if necessary (preferentially in Julien Oscar few weeks).  He was doing well on 3/25 and discharged with follow up with Dr. Benson Norway.    Hospital Course:  Acute on chronic pancreatitis with necrosis Infected pseudocyst with MSSA Caleb Chambers presenting with recurrent nausea/vomiting and abdominal pain. CT abd pelvis notable for two cystgastrostomy catheters decompressiing previous large pancreatic pseudocyst and two small heterogeneous collections persisting not drained by catheters; one adjacent to pancreatic tail and one adjacent to uncinate process. Gastroenterology discussed case with IR, unable to place drain surrounding uncinate process given proximity to SMA. --GI Dr. Benson Norway following; appreciate assistance -> per 3/23 note, ok for discharge - per previous provider discussion with GI, recommend cefdinir x 14 days at discharge - discharged with **Note De-Identified vi Obfusction** oxycodone 10 mg q6  prn --Continue TPN per phrmcy, receives insulin with this s well - dischrge with TPN with home helth --Low-ft diet --incresed Creon to 72,000uwith melsnd 36,000 with sncks per Dr. Donnet Romberg note --Pln for d/c 3/25 AM with pln for cefdinir x14 dys  Type 2 dibetes mellitus, poorly controlled Hemoglobin A1c 8.7 06/27/2019; improved from 13.2 04/22/2019.  --continue insulin in TPN, currently 45u regulr insulin; mngement per phrmcy -- dischrge with 3 units TID novolog with mels when TPN off during the dy - rx written for this t dischrge, but pt recently filled this here nd phrmcy did not fill tody - I ws not wre until fter he ws dischrged --voiding bsl insulin while on TPN --Continue ISS for further coverge --Dibetic eductor consult, given his complex cse with TPN nd irregulr mel consumption  Gout:continue llopurinol 358m PO dily  Pseudohypontremi Etiology etiology likely secondry to hyperglycemi, now resolved with improved glycemic control. --monitor BMP dily  Moderte protein clorie mlnutrition Prelbumin 13.5. --Nutrition following, pprecite ssistnce --continue TPN, 75% dose --boost Breeze twice dily --Low ft diet  Nutrition Sttus: Nutrition Problem: Moderte Mlnutrition Etiology: chronic illness(pncretitis) Signs/Symptoms: percent weight loss, energy intke < or equl to 75% for > or equl to 1 month, mild ft depletion, moderte ft depletion, mild muscle depletion, moderte muscle depletion Percent weight loss: 13.7 % Interventions: Boost Breeze, TPN  Hypoklemi Repleted during hospitliztion. Potssium 3.9 tody. --Continue supplementtion per nutrition/TPN --BMP dily  Hypomgnesmi Mgnesium 1.8 tody, will replete.  GERD:continue PPI  Obesity Body mss index is 40.4 kg/m.discussed need for lifestyle chnges /weight reduction s this complictes ll fcets of cre  Normocytic  nemi Hemoglobin stble, 7.7  Anxiety:received xnx prn  Hypertension: resume lisinopril on d/c - he sys he's no longer tking mlodipine, will d/c this  Left Foot Pin: plin films negtive, negtive UKorefor DVT.  He notes this does not feel like gout.  On exm, mild tenderness on dorsum of foot.  No significnt redness.  Recommended outptient follow up nd given return precutions.    Procedures:  none  Consulttions:  GI  Anti-infectives (From dmission, onwrd)   Strt     Dose/Rte Route Frequency Ordered Stop   07/09/19 0000  cefdinir (OMNICEF) 300 MG cpsule     600 mg Orl Dily 07/09/19 1328 07/23/19 2359   06/29/19 1200  ceFEPIme (MAXIPIME) 2 g in sodium chloride 0.9 % 100 mL IVPB     2 g 200 mL/hr over 30 Minutes Intrvenous Every 8 hours 06/29/19 1103     06/27/19 1830  cefTRIAXone (ROCEPHIN) 2 g in sodium chloride 0.9 % 100 mL IVPB  Sttus:  Discontinued     2 g 200 mL/hr over 30 Minutes Intrvenous Every 24 hours 06/27/19 1823 06/29/19 1103   06/27/19 1830  vncomycin (VANCOREADY) IVPB 1500 mg/300 mL  Sttus:  Discontinued     1,500 mg 150 mL/hr over 120 Minutes Intrvenous Every 12 hours 06/27/19 1823 06/29/19 1103   06/27/19 1745  metroNIDAZOLE (FLAGYL) IVPB 500 mg     500 mg 100 mL/hr over 60 Minutes Intrvenous Every 8 hours 06/27/19 1744       Dischrge Exm: Vitls:   07/09/19 0751 07/09/19 1300  BP: 140/88 (!) 141/93  Pulse: 87 93  Resp: 16 15  Temp: 97.9 F (36.6 C) 98.6 F (37 C)  SpO2: 100% 99%   C/o LLE pin he's noticed tody Abdominl discomfort, bout  5, improved fter pin meds  Generl: No **Note De-Identified vi Obfusction** cute distress.  Sitting up t edge of bed, comfortble. Crdiovsculr: Hert sounds show  regulr rte, nd rhythm Lungs: Cler to usculttion bilterlly  Abdomen: Soft, ttp on L, nondistended  Neurologicl: Alert nd oriented 3. Moves ll extremities 4. Crnil nerves II through XII grossly intct. Skin: Wrm nd dry. No rshes or  lesions. Extremities: mild edem bilterl LE, possibly slightly more on L.  TTP mildly to dorsum of L foot, mild.  Dischrge Instructions   Dischrge Instructions    Cll MD for:  difficulty brething, hedche or visul disturbnces   Complete by: As directed    Cll MD for:  extreme ftigue   Complete by: As directed    Cll MD for:  hives   Complete by: As directed    Cll MD for:  persistnt dizziness or light-hededness   Complete by: As directed    Cll MD for:  persistnt nuse nd vomiting   Complete by: As directed    Cll MD for:  redness, tenderness, or signs of infection (pin, swelling, redness, odor or green/yellow dischrge round incision site)   Complete by: As directed    Cll MD for:  severe uncontrolled pin   Complete by: As directed    Cll MD for:  temperture >100.4   Complete by: As directed    Diet - low sodium hert helthy   Complete by: As directed    Dischrge instructions   Complete by: As directed    You were seen for pncretitis.    You've improved fter IV ntibiotics.  We're sending you home with ntibiotics.  Increse your creon dose (tke 72,000 with mels nd 36,000 with sncks).  Continue your TPN.  We'll lso send you with mediction for pin, oxycodone.  Restrt your lisinopril for your blood pressure.  Follow this outptient, we're going to discontinue your mlodipine since you hve not been tking this t home.  Plese follow up with Dr. Benson Norwy outptient.  For your left foot, you did not hve ny blood clots in tht leg nd the x ry ws norml.  Plese follow up with your PCP regrding the pin.  Return for new, recurrent, or worsening symptoms.  Plese sk your PCP to request records from this hospitliztion so they know wht ws done nd wht the next steps will be.   Increse ctivity slowly   Complete by: As directed      Allergies s of 07/09/2019      Rections   Penicillins Anphylxis, Hives, Swelling, Rsh   Spoke  with ptient nd he reported in his 69s he hd  PCN (unsure which specific drug) nd developed  rsh with SOB. Reported no PCNs since.  Did it involve swelling of the fce/tongue/throt, SOB, or low BP? Unknown Did it involve sudden or severe rsh/hives, skin peeling, or ny rection on the inside of your mouth or nose? Unknown Did you need to seek medicl ttention t  hospitl or doctor's office? Unknown When did it lst hppen?mid 20's (ge) If ll bove nswers re "NO", my proc   Metoprolol Other (See Comments)   Chest pins - reported by [facility] nd  [facility] 02/08/14   Clindmycin Rsh, Other (See Comments)    reported by [facility] nd [facility] 09-30-13      Mediction List    STOP tking these medictions   mLODipine 10 MG tblet Commonly known s: NORVASC   doxycycline 100 MG tblet Commonly known s: VIBRA-TABS   metroNIDAZOLE 500 MG **Note De-Identified vi Obfusction** tblet Commonly known s: FLAGYL   morphine 15 MG 12 hr tblet Commonly known s: MS CONTIN     TAKE these medictions   llopurinol 300 MG tblet Commonly known s: ZYLOPRIM Tke 300 mg by mouth dily.   blood glucose meter kit nd supplies Kit Dispense bsed on ptient nd insurnce preference. Use up to four times dily s directed. (FOR ICD-9 250.00, 250.01).   bumetnide 0.5 MG tblet Commonly known s: BUMEX Tke 0.5 mg by mouth dily s needed (fluid).   cefdinir 300 MG cpsule Commonly known s: OMNICEF Tke 2 cpsules (600 mg totl) by mouth dily for 14 dys.   colchicine 0.6 MG tblet Commonly known s: Colcrys Tke 1 tblet (0.6 mg totl) by mouth 2 (two) times dily. Needs office visit (second notice) Wht chnged:   when to tke this  resons to tke this  dditionl instructions   folic cid 1 MG tblet Commonly known s: FOLVITE Tke 1 tblet (1 mg totl) by mouth dily.   Humir Pen 40 MG/0.4ML Pnkt Generic drug: Adlimumb 40 mg by Subderml route every 14 (fourteen) dys.   insulin sprt  100 UNIT/ML FlexPen Commonly known s: NOVOLOG Inject 3 Units into the skin 3 (three) times dily with mels.   lipse/protese/mylse 36000 UNITS Cpep cpsule Commonly known s: CREON Tke 2 cpsules (72,000 Units totl) by mouth 3 (three) times dily before mels. Wht chnged: how much to tke   lisinopril 20 MG tblet Commonly known s: ZESTRIL Tke 1 tblet (20 mg totl) by mouth dily.   metFORMIN 500 MG 24 hr tblet Commonly known s: GLUCOPHAGE-XR Tke 2 tblets (1,000 mg totl) by mouth dily with brekfst.   Oxycodone HCl 10 MG Tbs Tke 1 tblet (10 mg totl) by mouth every 6 (six) hours s needed for up to 5 dys for moderte pin or severe pin.   pntoprzole 40 MG tblet Commonly known s: PROTONIX Tke 1 tblet (40 mg totl) by mouth dily.   potssium chloride 10 MEQ tblet Commonly known s: KLOR-CON Tke 1 tblet (10 mEq totl) by mouth dily.   sterile wter SOLN with mino cids 10 % SOLN 1.3 g/kg, dextrose 70 % SOLN 20 % Inject into the vein See dmin instructions. Provided by [facility] - dminister vi PICC line 7:30pm - 7:30m      Allergies  Allergen Rections  . Penicillins Anphylxis, Hives, Swelling nd Rsh    Spoke with ptient nd he reported in his 60s he hd  PCN (unsure which specific drug) nd developed  rsh with SOB. Reported no PCNs since.  Did it involve swelling of the fce/tongue/throt, SOB, or low BP? Unknown Did it involve sudden or severe rsh/hives, skin peeling, or ny rection on the inside of your mouth or nose? Unknown Did you need to seek medicl ttention t  hospitl or doctor's office? Unknown When did it lst hppen?mid 20's (ge) If ll bove nswers re "NO", my proc  . Metoprolol Other (See Comments)    Chest pins - reported by [facility] nd  [facility] 02/08/14  . Clindmycin Rsh nd Other (See Comments)     reported by [facility] 09-30-13   Follow-up Informtion    [facility], [facility]  Follow up.   Specilty: [facility] Why: [place] needs continue for RN for IV infusion nursing needs t home. Contct informtion: [place] [address] [phone]        [facility] Follow up.  Why: [facility] will be following you for IV infusion needs - #[phone]       Bartholome Bill, MD. Call.   Specialty: Family Medicine Why: Followup as needed with your Primary Care Physician in the next week for close followup after your discharge home from the hospital today. Contact information: [place] [address] [phone]        Carol Ada, MD Follow up.   Specialty: Gastroenterology Why: Call for Aryah Doering follow up appointment with Dr. Benson Norway next week Contact information: [address] [place] [address] [phone]            The results of significant diagnostics from this hospitalization (including imaging, microbiology, ancillary and laboratory) are listed below for reference.    Significant Diagnostic Studies: CT ABDOMEN PELVIS W CONTRAST  Result Date: 06/27/2019 CLINICAL DATA:  Worsening abdominal pain and nausea. Recent admission for pancreatitis with pseudocyst. EXAM: CT ABDOMEN AND PELVIS WITH CONTRAST TECHNIQUE: Multidetector CT imaging of the abdomen and pelvis was performed using the standard protocol following bolus administration of intravenous contrast. CONTRAST:  100 cc Omnipaque 300 IV COMPARISON:  Most recent CT 05/25/2019 FINDINGS: Lower chest: Hypoventilatory atelectasis. No pleural fluid. Prominent heart size. Hepatobiliary: Diffusely decreased hepatic density consistent with steatosis. No evidence of focal liver lesion or intrahepatic fluid collection. Clips in the gallbladder fossa postcholecystectomy. No biliary dilatation. Pancreas: 2 cyst gastrostomy tubes in place decompressing previous large peripancreatic collection. Majority of the  peripancreatic collection has resolved with residual ill-defined soft tissue density about the body and tail. Small pocket centrally with mottled air measures 2.5 x 1.7 cm, series 3, image 38. Residual collection in the uncinate process with heterogeneous air in fluid measures 4.7 x 2.8 cm, series 3, image 53. Mild generalized peripancreatic fat stranding. Pancreatic duct is not well-defined. Spleen: Mildly enlarged spanning 14.5 cm. Mid splenic vein is not opacified and presumably occluded. No focal intrasplenic abnormality. Adrenals/Urinary Tract: Normal adrenal glands. Slight left renal heterogeneity is likely reactive. No hydronephrosis. No perinephric edema. Urinary bladder is physiologically distended. No bladder wall thickening. Stomach/Bowel: Bowel evaluation is limited in the absence of enteric contrast. 2 cyst gastrostomy tubes in the stomach. Gastric wall is poorly defined. There is fluid within the duodenum. No small bowel dilatation or obstruction. Mild edema of small bowel loops in the left abdomen, likely reactive. Liquid and solid stool throughout the colon. Liquid colonic stool makes detailed assessment for perienteric fluid collections difficult. Fluid in the subhepatic right upper quadrant appears to represent fluid within tortuous colon rather than Blayne Garlick discrete pericolonic fluid collection. Fat lobule abutting the sigmoid colon was seen on prior exam likely sequela of prior epiploic appendagitis. The appendix is normal. Vascular/Lymphatic: Portions of the mid splenic vein are not opacified and likely occluded. No visualized thrombus. The portal vein is patent. Multiple prominent peripancreatic and retroperitoneal nodes, likely reactive. Aorta bi-iliac atherosclerosis. Reproductive: Prostate is unremarkable. Other: Large pancreatic pseudocyst has improved post cyst-gastrostomy. Small residual collections as described above. Small volume of perihepatic ascites. Small amount of free fluid tracks in  the pericolic gutters. Central mesenteric edema. Musculoskeletal: Degenerative change in the spine. There are no acute or suspicious osseous abnormalities. IMPRESSION: 1. Two cyst gastrostomy catheters decompressing previous large pancreatic pseudocyst. Residual ill-defined pancreatic low-density in the region of catheters. Two small heterogeneous collections persist that are not definitively drained by catheters, 2.5 x 1.7 cm adjacent to the pancreatic tail, and 4.8 x 2.8 cm  collection adjacent to the uncinate process. 2. Liquid and solid stool throughout the colon, difficult to evaluate for perienteric fluid collections. Fluid in the subhepatic right upper quadrant appears to represent fluid within tortuous colon rather than Myli Pae discrete pericolonic fluid collection. 3. Portions of the mid splenic vein are not opacified and likely occluded. No visualized thrombus. 4. Hepatic steatosis. Aortic Atherosclerosis (ICD10-I70.0). Electronically Signed   By: Keith Rake M.D.   On: 06/27/2019 16:24   DG CHEST PORT 1 VIEW  Result Date: 07/05/2019 CLINICAL DATA:  PICC line. EXAM: PORTABLE CHEST 1 VIEW COMPARISON:  06/27/2019 FINDINGS: Caleb Chambers has RIGHT-sided PICC line, tip overlying the level of the superior vena cava. Heart is enlarged and accentuated by AP position of Caleb Chambers. There is minimal atelectasis in the MEDIAL lung bases. No pneumothorax or consolidation. IMPRESSION: Interval repositioning of RIGHT-sided PICC line. Tip now overlies the superior vena cava. Mild bibasilar atelectasis. Electronically Signed   By: Nolon Nations M.D.   On: 07/05/2019 09:58   DG Chest Portable 1 View  Result Date: 06/27/2019 CLINICAL DATA:  PICC placement EXAM: PORTABLE CHEST 1 VIEW COMPARISON:  04/23/2019 and older studies FINDINGS: Right PICC tip projects near the expected confluence of the right subclavian vein and superior vena cava. Heart, mediastinum and hila are unremarkable. The lungs are clear. No pleural effusion  or pneumothorax. Skeletal structures are grossly intact. IMPRESSION: 1. Right PICC tip projects at the confluence of the right subclavian vein and superior vena cava. 2. No active cardiopulmonary disease. Electronically Signed   By: Lajean Manes M.D.   On: 06/27/2019 13:42   DG Foot 2 Views Left  Result Date: 07/09/2019 CLINICAL DATA:  Left foot pain, no known injury, initial encounter EXAM: LEFT FOOT - 2 VIEW COMPARISON:  None. FINDINGS: No acute fracture or dislocation is noted. Calcaneal spurring and tarsal degenerative change is seen. No soft tissue abnormality is noted. IMPRESSION: No acute abnormality seen. Electronically Signed   By: Inez Catalina M.D.   On: 07/09/2019 12:06   VAS [place] LOWER EXTREMITY VENOUS (DVT)  Result Date: 07/09/2019  Lower Venous DVTStudy Indications: Edema.  Risk Factors: None identified. Comparison Study: No prior studies. Performing Technologist: Oliver Hum RVT  Examination Guidelines: Alysah Carton complete evaluation includes B-mode imaging, spectral Doppler, color Doppler, and power Doppler as needed of all accessible portions of each vessel. Bilateral testing is considered an integral part of Philipe Laswell complete examination. Limited examinations for reoccurring indications may be performed as noted. The reflux portion of the exam is performed with the Caleb Chambers in reverse Trendelenburg.  +-----+---------------+---------+-----------+----------+--------------+ RIGHTCompressibilityPhasicitySpontaneityPropertiesThrombus Aging +-----+---------------+---------+-----------+----------+--------------+ CFV  Full           Yes      Yes                                 +-----+---------------+---------+-----------+----------+--------------+   +---------+---------------+---------+-----------+----------+--------------+ LEFT     CompressibilityPhasicitySpontaneityPropertiesThrombus Aging +---------+---------------+---------+-----------+----------+--------------+ CFV      Full            Yes      Yes                                 +---------+---------------+---------+-----------+----------+--------------+ SFJ      Full                                                        +---------+---------------+---------+-----------+----------+--------------+  FV Prox  Full                                                        +---------+---------------+---------+-----------+----------+--------------+ FV Mid   Full                                                        +---------+---------------+---------+-----------+----------+--------------+ FV DistalFull                                                        +---------+---------------+---------+-----------+----------+--------------+ PFV      Full                                                        +---------+---------------+---------+-----------+----------+--------------+ POP      Full           Yes      Yes                                 +---------+---------------+---------+-----------+----------+--------------+ PTV      Full                                                        +---------+---------------+---------+-----------+----------+--------------+ PERO     Full                                                        +---------+---------------+---------+-----------+----------+--------------+     Summary: RIGHT: - No evidence of common femoral vein obstruction.  LEFT: - There is no evidence of deep vein thrombosis in the lower extremity.  - No cystic structure found in the popliteal fossa.  *See table(s) above for measurements and observations. Electronically signed by Servando Snare MD on 07/09/2019 at 4:39:42 PM.    Final    [place] EKG SITE RITE  Result Date: 07/05/2019 If Site Rite image not attached, placement could not be confirmed due to current cardiac rhythm.  [place] EKG SITE RITE  Result Date: 06/28/2019 If Site Rite image not attached, placement could not be confirmed due to  current cardiac rhythm.  [place] EKG SITE RITE  Result Date: 06/28/2019 If Site Rite image not attached, placement could not be confirmed due to current cardiac rhythm.   Microbiology: No results found for this or any previous visit (from the past 240 hour(s)).   Labs: Basic Metabolic Panel: Recent Labs  Lab 07/03/19 0452 07/04/19 0329 07/05/19 0556 07/06/19 1194 07/09/19  0635  NA 136 136 136 137 135  K 3.9 3.6 4.1 4.1 3.9  CL 106 104 104 104 104  CO2 _0 GLUCOSE 146* 155* 131* 164* 143*  BUN _1 CREATININE 0.50* 0.52* 0.48* 0.49* 0.45*  CALCIUM 8.3* 8.5* 8.4* 8.6* 8.5*  MG 1.7 1.8  --  1.7 1.6*  PHOS  --  3.3  --  3.8 3.7   Liver Function Tests: Recent Labs  Lab 07/06/19 0621 07/09/19 0635  AST 10* 12*  ALT 7 9  ALKPHOS 62 65  BILITOT 0.4 0.3  PROT 6.2* 6.5  ALBUMIN 2.1* 2.2*   No results for input(s): LIPASE, AMYLASE in the last 168 hours. No results for input(s): AMMONIA in the last 168 hours. CBC: Recent Labs  Lab 07/06/19 0621 07/09/19 0635  WBC 3.4* 3.4*  NEUTROABS 1.4* 1.5*  HGB 8.5* 9.1*  HCT 28.5* 30.3*  MCV 84.8 84.9  PLT 175 170   Cardiac Enzymes: No results for input(s): CKTOTAL, CKMB, CKMBINDEX, TROPONINI in the last 168 hours. BNP: BNP (last 3 results) Recent Labs    04/23/19 0450  BNP 99.2    ProBNP (last 3 results) No results for input(s): PROBNP in the last 8760 hours.  CBG: Recent Labs  Lab 07/08/19 2019 07/08/19 2355 07/09/19 0415 07/09/19 0751 07/09/19 1138  GLUCAP 168* 199* 263* 126* 158*       Signed:  Fayrene Helper MD.  [facility] 07/09/2019, 5:43 PM

## 2019-07-09 NOTE — Progress Notes (Signed)
Patient is discharged home on home health, discharged instruction given to patient, patient received his medications from transition of Care pharmacy. He claimed that he supposed to have his insulin but it is not in the bag. Rn checked with pharmacy and the pharmacist states the insurance denied it because it is not time for a refill. He just got his insulin pen on 3/08/ but patient states he didn't think he got it. Patient was recommended to get it from [facility] at $40 patient states he doesn't have money. Patient is advice to search his insulin once he gets home.

## 2019-07-09 NOTE — Progress Notes (Signed)
Inpatient Diabetes Program Recommendations  AACE/ADA: New Consensus Statement on Inpatient Glycemic Control (2015)  Target Ranges:  Prepandial:   less than 140 mg/dL      Peak postprandial:   less than 180 mg/dL (1-2 hours)      Critically ill patients:  140 - 180 mg/dL   Lab Results  Component Value Date   GLUCAP 126 (H) 07/09/2019   HGBA1C 8.7 (H) 06/27/2019      Note:  Spoke with patient at bedside this morning. Educated patient on insulin pen use at home. . Reviewed all steps of insulin pen including attachment of needle, 2-unit air shot, dialing up dose, giving injection, removing needle, disposal of sharps, storage of unused insulin, disposal of insulin etc. Patient has used insulin pens in the past.  Also reviewed trouble shooting with insulin pen.  He will inject 3 units Novolog TID with meals.  He is aware to check his blood sugar before meals and at bedtime.  We reviewed hypoglycemia and treatment.  He is aware he needs to bring his meter with him to follow up appointments for review.  He states he will have someone pick up his meter from the pharmacy today.  No further questions at this time.     Thank you, Dulce Sellar, RN, BSN Diabetes Coordinator Inpatient Diabetes Program [phone] (team pager from 8a-5p)

## 2019-07-09 NOTE — Plan of Care (Signed)
  Problem: Health Behavior/Discharge Planning: Goal: Ability to manage health-related needs will improve Outcome: Progressing   Problem: Activity: Goal: Risk for activity intolerance will decrease Outcome: Progressing   Problem: Pain Managment: Goal: General experience of comfort will improve Outcome: Progressing   Problem: Safety: Goal: Ability to remain free from injury will improve Outcome: Progressing   Problem: Skin Integrity: Goal: Risk for impaired skin integrity will decrease Outcome: Progressing   

## 2019-07-09 NOTE — TOC Transition Note (Signed)
Transition of Care [facility]) - CM/SW Discharge Note   Patient Details  Name: Caleb Chambers MRN: [id number] Date of Birth: [birth year]  Transition of Care [facility]) CM/SW Contact:  Curlene Labrum, RN Phone Number: 07/09/2019, 1:46 PM   Clinical Narrative:   Case management and Carolynn Sayers, IV infusion Care coordinator met with the patient at the bedside regarding discharge planning.  [name], was made aware that the patient would not have a ride home until 3 pm to help coordinate IV infusions from home.  Patient's family will be picking up his glucometer from the drug store.  Talked with the patient and the patient his concerned about controlling his abdominal pain from home without IV medications.  Dr Florene Glen is placing discharge medications - delivered through [facility] to the bedside - and aware of patient's concern for pain management.  Patient given the number to call Dr. Merilyn Baba office to arrange follow-up with primary care this coming week if he had concerns of pain management as well.    No other barriers to discharge noted at this time.    Final next level of care: [place] Barriers to Discharge: Barriers Resolved   Patient Goals and CMS Choice Patient states their goals for this hospitalization and ongoing recovery are:: I'm feeling a little better, my abdomen hurts at times - I'm getting that under control and I should be going home tomorrow. CMS Medicare.gov Compare Post Acute Care list provided to:: [name] is active with the patient along with [facility] infusion Carolynn Sayers))    Discharge Placement                       Discharge Plan and Services   Discharge Planning Services: CM Consult            DME Arranged: Glucometer(Hospitalist called in Glucometer this morning to pharmacy - electronic script)           [place]: [place] infusion - Carolynn Sayers) Date [name]: 07/02/19([name] at bedside) Time  [place]: 3235 Representative spoke with at [place]: Carolynn Sayers, [facility] infusion will arrange for [facility] to increase visits to M,Th for added antibiotic/TPN infusion continues  Social Determinants of Health (SDOH) Interventions     Readmission Risk Interventions Readmission Risk Prevention Plan 07/02/2019 06/09/2019  Transportation Screening Complete Complete  PCP or Specialist Appt within 3-5 Days Complete -  HRI or Home Care Consult Complete -  Social Work Consult for [place] Planning/Counseling Complete -  Palliative Care Screening Complete -  Medication Review Press photographer) Complete Complete  SW Recovery Care/Counseling Consult - Complete  [place] - Patient Refused  Some recent data might be hidden

## 2019-07-09 NOTE — Progress Notes (Signed)
Left lower extremity venous duplex has been completed. Preliminary results can be found in CV Proc through chart review.   07/09/19 12:01 PM Olen Cordial RVT

## 2019-07-09 NOTE — Progress Notes (Signed)
Nutrition Follow-up  DOCUMENTATION CODES:   Non-severe (moderate) malnutrition in context of chronic illness  INTERVENTION:   -TPN management per pharmacy  NUTRITION DIAGNOSIS:   Moderate Malnutrition related to chronic illness(pancreatitis) as evidenced by percent weight loss, energy intake < or equal to 75% for > or equal to 1 month, mild fat depletion, moderate fat depletion, mild muscle depletion, moderate muscle depletion.  Ongoing  GOAL:   Patient will meet greater than or equal to 90% of their needs  Progressing   MONITOR:   PO intake, Supplement acceptance, Diet advancement, Labs, Weight trends, Skin, I & O's  REASON FOR ASSESSMENT:   Consult New TPN/TNA  ASSESSMENT:   Caleb Chambers is a 54 y.o. male with medical history significant for but not limited history of uncontrolled diabetes mellitus type 2 with a history of DKA, gout, hypertension, morbid obesity, pancreatitis, history of noncompliance who was recently just discharged from the hospital on 06/22/2018 where is found and admitted for diabetic ketoacidosis, AKI and acute pancreatitis with pseudocyst.  During that hospitalization he underwent multiple EGDs with cystogastrostomy tube creation and double-pigtail replacements.  He underwent multiple EGDs as well as necrosectomy treated for his acute pancreatitis with necrosis and infected pseudocyst with staph aureus.  He was placed on vancomycin and Flagyl and transition to doxycycline metronidazole at discharge.  Plan was for him to new antibiotics until repeat CT of the abdomen and pelvis in 2 weeks.  Patient was on TPN during that hospitalization and it was reduced to half the dose.  He now presents again diarrhea, nausea, vomiting and 10 out of 10 abdominal pain which was initially dull and stabbing and now is just throbbing.  Patient states that he woke up with diarrhea around 2 AM and had multiple episodes.  Tried to maintain his fluid balance and started vomiting  and started having abdominal pain.  Was unable to keep anything down.  He called the gastroenterology office and he was advised to come to the ED for further evaluation recommendations.  In the ED he had a CT of the abdomen pelvis done and he had some abdominal pain that was radiating to the left.  Repeat CT of the abdomen pelvis was done and showed that there is still some residual persistent collections that were definitely not drained by the catheters adjacent to the uncinate process.  Patient clinically is not septic appearing but is complaining of significant abdominal pain.  Because of his uncontrolled symptoms he will be brought into the hospital for further evaluation and will be consulted for further recommendations.  Reviewed I/O's: +1.2 L x 24 hours and +11.9 L since admission  UOP: 1.3 L x 24 hours   Pt receiving nursing care at time of visit. Intake remains erratic; noted meal completion 10-100%.   Pt remains on cyclic TPN (infuse 5573 mls over 12 hrs: 65 mL/hr x 1 hr, then 131 mL/hr x 10 hrs, then 65 mL/hr x 1 hr). Regimen provides 1798 kcals and 107 grams protein needs, meeting 75% of estimated kcal and protein needs.   Per MD notes, pt will discharge home today.   Labs reviewed: CBGS: 126-158 (inpatient orders for glycemic control are 0-15 units insulin aspart every 4 hours).   Diet Order:   Diet Order            Diet - low sodium heart healthy        Diet heart healthy/carb modified Room service appropriate? Yes; Fluid consistency: Thin  Diet  effective now              EDUCATION NEEDS:   Education needs have been addressed  Skin:  Skin Assessment: Reviewed RN Assessment  Last BM:  07/07/19  Height:   Ht Readings from Last 1 Encounters:  06/27/19 5\' 11"  (1.803 m)    Weight:   Wt Readings from Last 1 Encounters:  07/07/19 131.4 kg    Ideal Body Weight:  78.2 kg  BMI:  Body mass index is 40.4 kg/m.  Estimated Nutritional Needs:   Kcal:   07/09/19  Protein:  140-155 grams  Fluid:  > 2.3 L    [id number], RD, LDN, CDCES Registered Dietitian II Certified Diabetes Care and Education Specialist Please refer to [facility] for RD and/or RD on-call/weekend/after hours pager

## 2019-07-31 ENCOUNTER — Other Ambulatory Visit (HOSPITAL_COMMUNITY): Payer: Self-pay | Admitting: Gastroenterology

## 2019-07-31 ENCOUNTER — Other Ambulatory Visit: Payer: Self-pay | Admitting: Gastroenterology

## 2019-07-31 DIAGNOSIS — K861 Other chronic pancreatitis: Secondary | ICD-10-CM

## 2019-08-11 ENCOUNTER — Ambulatory Visit (HOSPITAL_COMMUNITY): Payer: BC Managed Care – PPO

## 2019-08-11 ENCOUNTER — Encounter (HOSPITAL_COMMUNITY): Payer: Self-pay

## 2019-09-10 ENCOUNTER — Ambulatory Visit (HOSPITAL_COMMUNITY)
Admission: RE | Admit: 2019-09-10 | Discharge: 2019-09-10 | Disposition: A | Discharge status: Home / Self Care | Payer: BC Managed Care – PPO | Source: Ambulatory Visit | Attending: Gastroenterology | Admitting: Gastroenterology

## 2019-09-10 ENCOUNTER — Encounter (HOSPITAL_COMMUNITY): Payer: Self-pay

## 2019-09-10 ENCOUNTER — Other Ambulatory Visit: Payer: Self-pay

## 2019-09-10 DIAGNOSIS — K861 Other chronic pancreatitis: Secondary | ICD-10-CM

## 2019-09-10 LAB — POCT I-STAT CREATININE: Creatinine, Ser: 0.5 mg/dL — ABNORMAL LOW (ref 0.61–1.24)

## 2019-09-10 MED ORDER — SODIUM CHLORIDE (PF) 0.9 % IJ SOLN
INTRAMUSCULAR | Status: AC
  Filled 2019-09-10: qty 50

## 2019-09-10 MED ORDER — IOHEXOL 300 MG/ML  SOLN
100.0000 mL | Freq: Once | INTRAMUSCULAR | Status: AC | PRN
  Administered 2019-09-10: 100 mL via INTRAVENOUS

## 2019-09-16 ENCOUNTER — Other Ambulatory Visit: Payer: Self-pay | Admitting: Gastroenterology

## 2019-10-06 ENCOUNTER — Other Ambulatory Visit (HOSPITAL_COMMUNITY)
Admission: RE | Admit: 2019-10-06 | Discharge: 2019-10-06 | Disposition: A | Discharge status: Home / Self Care | Payer: BC Managed Care – PPO | Source: Ambulatory Visit | Attending: Gastroenterology | Admitting: Gastroenterology

## 2019-10-06 DIAGNOSIS — Z20822 Contact with and (suspected) exposure to covid-19: Secondary | ICD-10-CM | POA: Insufficient documentation

## 2019-10-06 DIAGNOSIS — Z01812 Encounter for preprocedural laboratory examination: Secondary | ICD-10-CM | POA: Diagnosis present

## 2019-10-06 LAB — SARS CORONAVIRUS 2 (TAT 6-24 HRS): SARS Coronavirus 2: NEGATIVE

## 2019-10-09 ENCOUNTER — Ambulatory Visit (HOSPITAL_COMMUNITY): Payer: BC Managed Care – PPO | Admitting: Certified Registered Nurse Anesthetist

## 2019-10-09 ENCOUNTER — Encounter (HOSPITAL_COMMUNITY)
Admission: RE | Disposition: A | Discharge status: Home / Self Care | Payer: Self-pay | Source: Home / Self Care | Attending: Gastroenterology

## 2019-10-09 ENCOUNTER — Other Ambulatory Visit: Payer: Self-pay

## 2019-10-09 ENCOUNTER — Encounter (HOSPITAL_COMMUNITY): Payer: Self-pay | Admitting: Gastroenterology

## 2019-10-09 ENCOUNTER — Ambulatory Visit (HOSPITAL_COMMUNITY)
Admission: RE | Admit: 2019-10-09 | Discharge: 2019-10-09 | Disposition: A | Discharge status: Home / Self Care | Payer: BC Managed Care – PPO | Attending: Gastroenterology | Admitting: Gastroenterology

## 2019-10-09 DIAGNOSIS — E119 Type 2 diabetes mellitus without complications: Secondary | ICD-10-CM | POA: Diagnosis not present

## 2019-10-09 DIAGNOSIS — Z881 Allergy status to other antibiotic agents status: Secondary | ICD-10-CM | POA: Diagnosis not present

## 2019-10-09 DIAGNOSIS — Z9049 Acquired absence of other specified parts of digestive tract: Secondary | ICD-10-CM | POA: Diagnosis not present

## 2019-10-09 DIAGNOSIS — Z8249 Family history of ischemic heart disease and other diseases of the circulatory system: Secondary | ICD-10-CM | POA: Insufficient documentation

## 2019-10-09 DIAGNOSIS — Z8719 Personal history of other diseases of the digestive system: Secondary | ICD-10-CM | POA: Diagnosis not present

## 2019-10-09 DIAGNOSIS — I10 Essential (primary) hypertension: Secondary | ICD-10-CM | POA: Insufficient documentation

## 2019-10-09 DIAGNOSIS — Z9889 Other specified postprocedural states: Secondary | ICD-10-CM | POA: Insufficient documentation

## 2019-10-09 DIAGNOSIS — Z4589 Encounter for adjustment and management of other implanted devices: Secondary | ICD-10-CM | POA: Insufficient documentation

## 2019-10-09 DIAGNOSIS — Z880 Allergy status to penicillin: Secondary | ICD-10-CM | POA: Diagnosis not present

## 2019-10-09 DIAGNOSIS — Z888 Allergy status to other drugs, medicaments and biological substances status: Secondary | ICD-10-CM | POA: Diagnosis not present

## 2019-10-09 HISTORY — PX: STENT REMOVAL: SHX6421

## 2019-10-09 HISTORY — PX: ESOPHAGOGASTRODUODENOSCOPY (EGD) WITH PROPOFOL: SHX5813

## 2019-10-09 LAB — GLUCOSE, CAPILLARY
Glucose-Capillary: 137 mg/dL — ABNORMAL HIGH (ref 70–99)
Glucose-Capillary: 126 mg/dL — ABNORMAL HIGH (ref 70–99)

## 2019-10-09 SURGERY — ESOPHAGOGASTRODUODENOSCOPY (EGD) WITH PROPOFOL
Anesthesia: Monitor Anesthesia Care

## 2019-10-09 MED ORDER — SODIUM CHLORIDE 0.9 % IV SOLN: INTRAVENOUS | Status: DC

## 2019-10-09 MED ORDER — ONDANSETRON HCL 4 MG/2ML IJ SOLN
INTRAMUSCULAR | Status: DC | PRN
  Administered 2019-10-09: 4 mg via INTRAVENOUS

## 2019-10-09 MED ORDER — LIDOCAINE HCL (CARDIAC) PF 100 MG/5ML IV SOSY
PREFILLED_SYRINGE | INTRAVENOUS | Status: DC | PRN
Start: 2019-10-09 — End: 2019-10-09
  Administered 2019-10-09: 60 mg via INTRAVENOUS

## 2019-10-09 MED ORDER — LACTATED RINGERS IV SOLN
INTRAVENOUS | Status: AC | PRN
  Administered 2019-10-09: 1000 mL via INTRAVENOUS

## 2019-10-09 MED ORDER — PROPOFOL 500 MG/50ML IV EMUL
INTRAVENOUS | Status: DC | PRN
  Administered 2019-10-09: 125 ug/kg/min via INTRAVENOUS

## 2019-10-09 MED ORDER — PROPOFOL 500 MG/50ML IV EMUL
INTRAVENOUS | Status: DC | PRN
  Administered 2019-10-09: 50 mg via INTRAVENOUS

## 2019-10-09 SURGICAL SUPPLY — 14 items

## 2019-10-09 NOTE — Anesthesia Preprocedure Evaluation (Signed)
Anesthesia Evaluation  Patient identified by MRN, date of birth, ID band Patient awake    Reviewed: Allergy & Precautions, NPO status , Patient's Chart, lab work & pertinent test results  History of Anesthesia Complications Negative for: history of anesthetic complications  Airway Mallampati: II  TM Distance: >3 FB Neck ROM: Full    Dental  (+) Teeth Intact   Pulmonary neg pulmonary ROS,    Pulmonary exam normal        Cardiovascular hypertension, Pt. on medications Normal cardiovascular exam     Neuro/Psych negative neurological ROS  negative psych ROS   GI/Hepatic Neg liver ROS, necrotizing pancreatitis s/p stent   Endo/Other  diabetes, Insulin Dependent  Renal/GU negative Renal ROS  negative genitourinary   Musculoskeletal  (+) Arthritis ,   Abdominal   Peds  Hematology negative hematology ROS (+)   Anesthesia Other Findings   Reproductive/Obstetrics                             Anesthesia Physical Anesthesia Plan  ASA: III  Anesthesia Plan: MAC   Post-op Pain Management:    Induction: Intravenous  PONV Risk Score and Plan: 1 and Propofol infusion, TIVA and Treatment may vary due to age or medical condition  Airway Management Planned: Natural Airway, Nasal Cannula and Simple Face Mask  Additional Equipment: None  Intra-op Plan:   Post-operative Plan:   Informed Consent: I have reviewed the patients History and Physical, chart, labs and discussed the procedure including the risks, benefits and alternatives for the proposed anesthesia with the patient or authorized representative who has indicated his/her understanding and acceptance.       Plan Discussed with:   Anesthesia Plan Comments:         Anesthesia Quick Evaluation

## 2019-10-09 NOTE — H&P (Signed)
Caleb Chambers HPI: The patient is s/p WON with stent placement. He responded very well to the treatment. The patient is here today to have a removal of the stents. The recent CT scan reviewed by Dr. Meridee Score shows that he is ready to undergo the stent removals.  He feels well and he managed to lose a significant amount of weight.  Caleb Chambers was recently cleared to return to work.  Past Medical History:  Diagnosis Date  . Diabetes mellitus without complication (HCC)   . DKA (diabetic ketoacidoses) (HCC) 05/12/2019  . Gout   . Hypertension   . Pancreatitis     Past Surgical History:  Procedure Laterality Date  . BILIARY STENT PLACEMENT  05/28/2019   Procedure: BILIARY STENT PLACEMENT;  Surgeon: Meridee Score Netty Starring., MD;  Location: [facility] ENDOSCOPY;  Service: Gastroenterology;;  plastic double pig tail stents placed through cystgastrostomy x2  . CHOLECYSTECTOMY    . ENDOROTOR N/A 06/01/2019   Procedure: [id number];  Surgeon: Mansouraty, Netty Starring., MD;  Location: [facility] ENDOSCOPY;  Service: Gastroenterology;  Laterality: N/A;  . ENDOROTOR N/A 06/03/2019   Procedure: [id number];  Surgeon: Mansouraty, Netty Starring., MD;  Location: [facility] ENDOSCOPY;  Service: Gastroenterology;  Laterality: N/A;  . ENDOROTOR N/A 06/05/2019   Procedure: [id number];  Surgeon: Mansouraty, Netty Starring., MD;  Location: [facility] ENDOSCOPY;  Service: Gastroenterology;  Laterality: N/A;  . ENDOROTOR  06/08/2019   Procedure: [id number];  Surgeon: Lemar Lofty., MD;  Location: [facility] ENDOSCOPY;  Service: Gastroenterology;;  . Sloan Leiter N/A 06/12/2019   Procedure: [id number];  Surgeon: Mansouraty, Netty Starring., MD;  Location: [facility] ENDOSCOPY;  Service: Gastroenterology;  Laterality: N/A;  . ENDOROTOR N/A 06/15/2019   Procedure: TXMIWOEHO;  Surgeon: Mansouraty, Netty Starring., MD;  Location: [facility] ENDOSCOPY;  Service: Gastroenterology;  Laterality: N/A;  . ENDOROTOR N/A 06/17/2019   Procedure: [id number];  Surgeon: Mansouraty, Netty Starring., MD;  Location:  [facility] ENDOSCOPY;  Service: Gastroenterology;  Laterality: N/A;  . ESOPHAGOGASTRODUODENOSCOPY N/A 06/01/2019   Procedure: ESOPHAGOGASTRODUODENOSCOPY (EGD) with NECROSECTOMY;  Surgeon: Mansouraty, Netty Starring., MD;  Location: [facility] ENDOSCOPY;  Service: Gastroenterology;  Laterality: N/A;  . ESOPHAGOGASTRODUODENOSCOPY Bilateral 06/03/2019   Procedure: ESOPHAGOGASTRODUODENOSCOPY (EGD) with Necrosectomy;  Surgeon: Meridee Score Netty Starring., MD;  Location: [facility] ENDOSCOPY;  Service: Gastroenterology;  Laterality: Bilateral;  . ESOPHAGOGASTRODUODENOSCOPY N/A 06/05/2019   Procedure: ESOPHAGOGASTRODUODENOSCOPY (EGD) with Necrosectomy;  Surgeon: Meridee Score Netty Starring., MD;  Location: [facility] ENDOSCOPY;  Service: Gastroenterology;  Laterality: N/A;  . ESOPHAGOGASTRODUODENOSCOPY N/A 06/08/2019   Procedure: ESOPHAGOGASTRODUODENOSCOPY (EGD) with NECROSECTOMY;  Surgeon: Mansouraty, Netty Starring., MD;  Location: [facility] ENDOSCOPY;  Service: Gastroenterology;  Laterality: N/A;  . ESOPHAGOGASTRODUODENOSCOPY N/A 06/12/2019   Procedure: ESOPHAGOGASTRODUODENOSCOPY (EGD) with NECROSECTOMY;  Surgeon: Mansouraty, Netty Starring., MD;  Location: [facility] ENDOSCOPY;  Service: Gastroenterology;  Laterality: N/A;  . ESOPHAGOGASTRODUODENOSCOPY N/A 06/15/2019   Procedure: ESOPHAGOGASTRODUODENOSCOPY (EGD) with NECROSECTOMY;  Surgeon: Mansouraty, Netty Starring., MD;  Location: [facility] ENDOSCOPY;  Service: Gastroenterology;  Laterality: N/A;  . ESOPHAGOGASTRODUODENOSCOPY N/A 06/17/2019   Procedure: ESOPHAGOGASTRODUODENOSCOPY (EGD) with NECROSECTOMY;  Surgeon: Mansouraty, Netty Starring., MD;  Location: [facility] ENDOSCOPY;  Service: Gastroenterology;  Laterality: N/A;  . ESOPHAGOGASTRODUODENOSCOPY (EGD) WITH PROPOFOL N/A 05/28/2019   Procedure: ESOPHAGOGASTRODUODENOSCOPY (EGD) WITH PROPOFOL;  Surgeon: Meridee Score Netty Starring., MD;  Location: [facility] ENDOSCOPY;  Service: Gastroenterology;  Laterality: N/A;  . EUS Left 05/28/2019   Procedure: UPPER ENDOSCOPIC ULTRASOUND (EUS) LINEAR;  Surgeon:  Lemar Lofty., MD;  Location: [facility] ENDOSCOPY;  Service: Gastroenterology;  Laterality: Left;  . PANCREATIC STENT PLACEMENT  05/28/2019   Procedure: PANCREATIC STENT PLACEMENT;  Surgeon: Lemar Lofty., MD;  Location: [facility] ENDOSCOPY;  Service: Gastroenterology;;  cystgastrostomy with axiod stent placement  . PANCREATIC STENT PLACEMENT  06/01/2019   Procedure: PANCREATIC STENT PLACEMENT;  Surgeon: Meridee Score Netty Starring., MD;  Location: [facility] ENDOSCOPY;  Service: Gastroenterology;;  . PANCREATIC STENT PLACEMENT  06/03/2019   Procedure: PANCREATIC STENT PLACEMENT;  Surgeon: Lemar Lofty., MD;  Location: [facility] ENDOSCOPY;  Service: Gastroenterology;;  double pig tail stents placed through cystgastrostomy  . PANCREATIC STENT PLACEMENT  06/05/2019   Procedure: PANCREATIC STENT PLACEMENT;  Surgeon: Meridee Score Netty Starring., MD;  Location: [facility] ENDOSCOPY;  Service: Gastroenterology;;  . PANCREATIC STENT PLACEMENT  06/08/2019   Procedure: PANCREATIC STENT PLACEMENT;  Surgeon: Lemar Lofty., MD;  Location: [facility] ENDOSCOPY;  Service: Gastroenterology;;  double pigtail cyst gastrostomy   . PANCREATIC STENT PLACEMENT  06/12/2019   Procedure: PANCREATIC STENT PLACEMENT;  Surgeon: Meridee Score Netty Starring., MD;  Location: [facility] ENDOSCOPY;  Service: Gastroenterology;;  double pigtail stents placed through cystgastrostomy  . PANCREATIC STENT PLACEMENT  06/15/2019   Procedure: PANCREATIC STENT PLACEMENT;  Surgeon: Meridee Score Netty Starring., MD;  Location: [facility] ENDOSCOPY;  Service: Gastroenterology;;  . PANCREATIC STENT PLACEMENT  06/17/2019   Procedure: PANCREATIC STENT PLACEMENT;  Surgeon: Lemar Lofty., MD;  Location: [facility] ENDOSCOPY;  Service: Gastroenterology;;  . Francine Graven REMOVAL  06/01/2019   Procedure: STENT REMOVAL;  Surgeon: Lemar Lofty., MD;  Location: [facility] ENDOSCOPY;  Service: Gastroenterology;;  . Francine Graven REMOVAL  06/03/2019   Procedure: STENT REMOVAL;  Surgeon: Lemar Lofty., MD;   Location: [facility] ENDOSCOPY;  Service: Gastroenterology;;  . Francine Graven REMOVAL  06/05/2019   Procedure: STENT REMOVAL;  Surgeon: Lemar Lofty., MD;  Location: [facility] ENDOSCOPY;  Service: Gastroenterology;;  . Francine Graven REMOVAL  06/08/2019   Procedure: STENT REMOVAL;  Surgeon: Lemar Lofty., MD;  Location: [facility] ENDOSCOPY;  Service: Gastroenterology;;  . Francine Graven REMOVAL  06/12/2019   Procedure: STENT REMOVAL;  Surgeon: Lemar Lofty., MD;  Location: [facility] ENDOSCOPY;  Service: Gastroenterology;;  . Francine Graven REMOVAL  06/15/2019   Procedure: STENT REMOVAL;  Surgeon: Lemar Lofty., MD;  Location: [facility] ENDOSCOPY;  Service: Gastroenterology;;  . Francine Graven REMOVAL  06/17/2019   Procedure: STENT REMOVAL;  Surgeon: Lemar Lofty., MD;  Location: [facility] ENDOSCOPY;  Service: Gastroenterology;;    Family History  Problem Relation Age of Onset  . Healthy Mother   . Hypertension Father     Social History:  reports that he has never smoked. He has never used smokeless tobacco. He reports previous alcohol use. No history on file for drug use.  Allergies:  Allergies  Allergen Reactions  . Penicillins Anaphylaxis, Hives, Swelling and Rash    Spoke with patient and he reported in his 40s he had a PCN (unsure which specific drug) and developed a rash with SOB. Reported no PCNs since.  Did it involve swelling of the face/tongue/throat, SOB, or low BP? Unknown Did it involve sudden or severe rash/hives, skin peeling, or any reaction on the inside of your mouth or nose? Unknown Did you need to seek medical attention at a hospital or doctor's office? Unknown When did it last happen?mid 20's (age) If all above answers are "NO", may proc  . Metoprolol Other (See Comments)    Chest pains - reported by [facility] and  [facility] 02/08/14  . Clindamycin Rash and Other (See Comments)     reported by [facility] and [facility] 09-30-13    Medications:  Scheduled:  Continuous: . sodium chloride    .  lactated  ringers 1,000 mL (10/09/19 0720)    Results for orders placed or performed during the hospital encounter of 10/09/19 (from the past 24 hour(s))  Glucose, capillary     Status: Abnormal   Collection Time: 10/09/19  7:19 AM  Result Value Ref Range   Glucose-Capillary 137 (H) 70 - 99 mg/dL     No results found.  ROS:  As stated above in the HPI otherwise negative.  Blood pressure 127/77, pulse 93, temperature 98.8 F (37.1 C), temperature source Oral, resp. rate 15, height 5\' 11"  (1.803 m), weight 120.2 kg, SpO2 99 %.    PE: Gen: NAD, Alert and Oriented HEENT:  [place]/AT, EOMI Neck: Supple, no LAD Lungs: CTA Bilaterally CV: RRR without M/G/R ABD: Soft, NTND, +BS Ext: No C/C/E  Assessment/Plan: 1) WON stent removal.  Latima Hamza D 10/09/2019, 7:31 AM

## 2019-10-09 NOTE — Discharge Instructions (Signed)

## 2019-10-09 NOTE — Op Note (Addendum)
[facility] Patient Name: Caleb Chambers Procedure Date: 10/09/2019 MRN: [id number] Attending MD: Jeani Hawking , MD Date of Birth: 1965/08/17 CSN: [id number] Age: 54 Admit Type: Outpatient Procedure:                Upper GI endoscopy Indications:              Therapeutic procedure Providers:                Jeani Hawking, MD, Rogue Jury, RN, Glory Rosebush,                            RN, Arlee Muslim Tech., Technician, Greig Right,                            CRNA Referring MD:              Medicines:                Propofol per Anesthesia Complications:            No immediate complications. Estimated Blood Loss:     Estimated blood loss: none. Procedure:                Pre-Anesthesia Assessment:                           - Prior to the procedure, a History and Physical                            was performed, and patient medications and                            allergies were reviewed. The patient's tolerance of                            previous anesthesia was also reviewed. The risks                            and benefits of the procedure and the sedation                            options and risks were discussed with the patient.                            All questions were answered, and informed consent                            was obtained. Prior Anticoagulants: The patient has                            taken no previous anticoagulant or antiplatelet                            agents. ASA Grade Assessment: III - A patient with                            severe  systemic disease. After reviewing the risks                            and benefits, the patient was deemed in                            satisfactory condition to undergo the procedure.                           - Sedation was administered by an anesthesia                            professional. Deep sedation was attained.                           After obtaining informed consent, the endoscope  was                            passed under direct vision. Throughout the                            procedure, the patient's blood pressure, pulse, and                            oxygen saturations were monitored continuously. The                            GIF-H190 ([id number] Olympus gastroscope was                            introduced through the mouth, and advanced to the                            second part of duodenum. The upper GI endoscopy was                            accomplished without difficulty. The patient                            tolerated the procedure well. Scope In: Scope Out: Findings:      The esophagus was normal.      Two double pigtail plastic stents were found in the gastric body. Stent       removal was accomplished with a snare. The stents were easily removed       without any difficulty or tension. A relook endoscopy was performed and       there was no evidence of any overt complications.      The examined duodenum was normal. Impression:               - Normal esophagus.                           - Pre-existing gastric stent, removed.                           -  Normal examined duodenum. Moderate Sedation:      Not Applicable - Patient had care per Anesthesia. Recommendation:           - Patient has a contact number available for                            emergencies. The signs and symptoms of potential                            delayed complications were discussed with the                            patient. Return to normal activities tomorrow.                            Written discharge instructions were provided to the                            patient.                           - Resume previous diet.                           - Continue present medications.                           - Return to GI clinic in 3 months. Procedure Code(s):        --- Professional ---                           [phone], Esophagogastroduodenoscopy, flexible,                             transoral; with removal of foreign body(s) Diagnosis Code(s):        --- Professional ---                           Z97.8, Presence of other specified devices CPT copyright 2019 [employer]. All rights reserved. The codes documented in this report are preliminary and upon coder review may  be revised to meet current compliance requirements. Jeani Hawking, MD Jeani Hawking, MD 10/09/2019 8:37:37 AM This report has been signed electronically. Number of Addenda: 0

## 2019-10-09 NOTE — Anesthesia Postprocedure Evaluation (Signed)
Anesthesia Post Note  Patient: Caleb Chambers  Procedure(s) Performed: ESOPHAGOGASTRODUODENOSCOPY (EGD) WITH PROPOFOL (N/A ) STENT REMOVAL     Patient location during evaluation: Endoscopy Anesthesia Type: MAC Level of consciousness: awake and alert Pain management: pain level controlled Vital Signs Assessment: post-procedure vital signs reviewed and stable Respiratory status: spontaneous breathing, nonlabored ventilation and respiratory function stable Cardiovascular status: blood pressure returned to baseline and stable Postop Assessment: no apparent nausea or vomiting Anesthetic complications: no   No complications documented.  Last Vitals:  Vitals:   10/09/19 0715 10/09/19 0835  BP: 127/77 128/74  Pulse: 93 76  Resp: 15 15  Temp: 37.1 C   SpO2: 99% 100%    Last Pain:  Vitals:   10/09/19 0835  TempSrc:   PainSc: 0-No pain                 Lidia Collum

## 2019-10-09 NOTE — Transfer of Care (Signed)
Immediate Anesthesia Transfer of Care Note  Patient: Caleb Chambers  Procedure(s) Performed: Procedure(s): ESOPHAGOGASTRODUODENOSCOPY (EGD) WITH PROPOFOL (N/A) STENT REMOVAL  Patient Location: PACU  Anesthesia Type:MAC  Level of Consciousness:  sedated, patient cooperative and responds to stimulation  Airway & Oxygen Therapy:Patient Spontanous Breathing and Patient connected to face mask oxgen  Post-op Assessment:  Report given to PACU RN and Post -op Vital signs reviewed and stable  Post vital signs:  Reviewed and stable  Last Vitals:  Vitals:   10/09/19 0715  BP: 127/77  Pulse: 93  Resp: 15  Temp: 37.1 C  SpO2: 83%    Complications: No apparent anesthesia complications

## 2020-04-08 IMAGING — DX DG CHEST 1V PORT
1 series · 1 of 1 positions shown · non-contrast
Comparison: 06/27/2019

CLINICAL DATA: PICC line.

EXAM:
PORTABLE CHEST 1 VIEW

[chest]
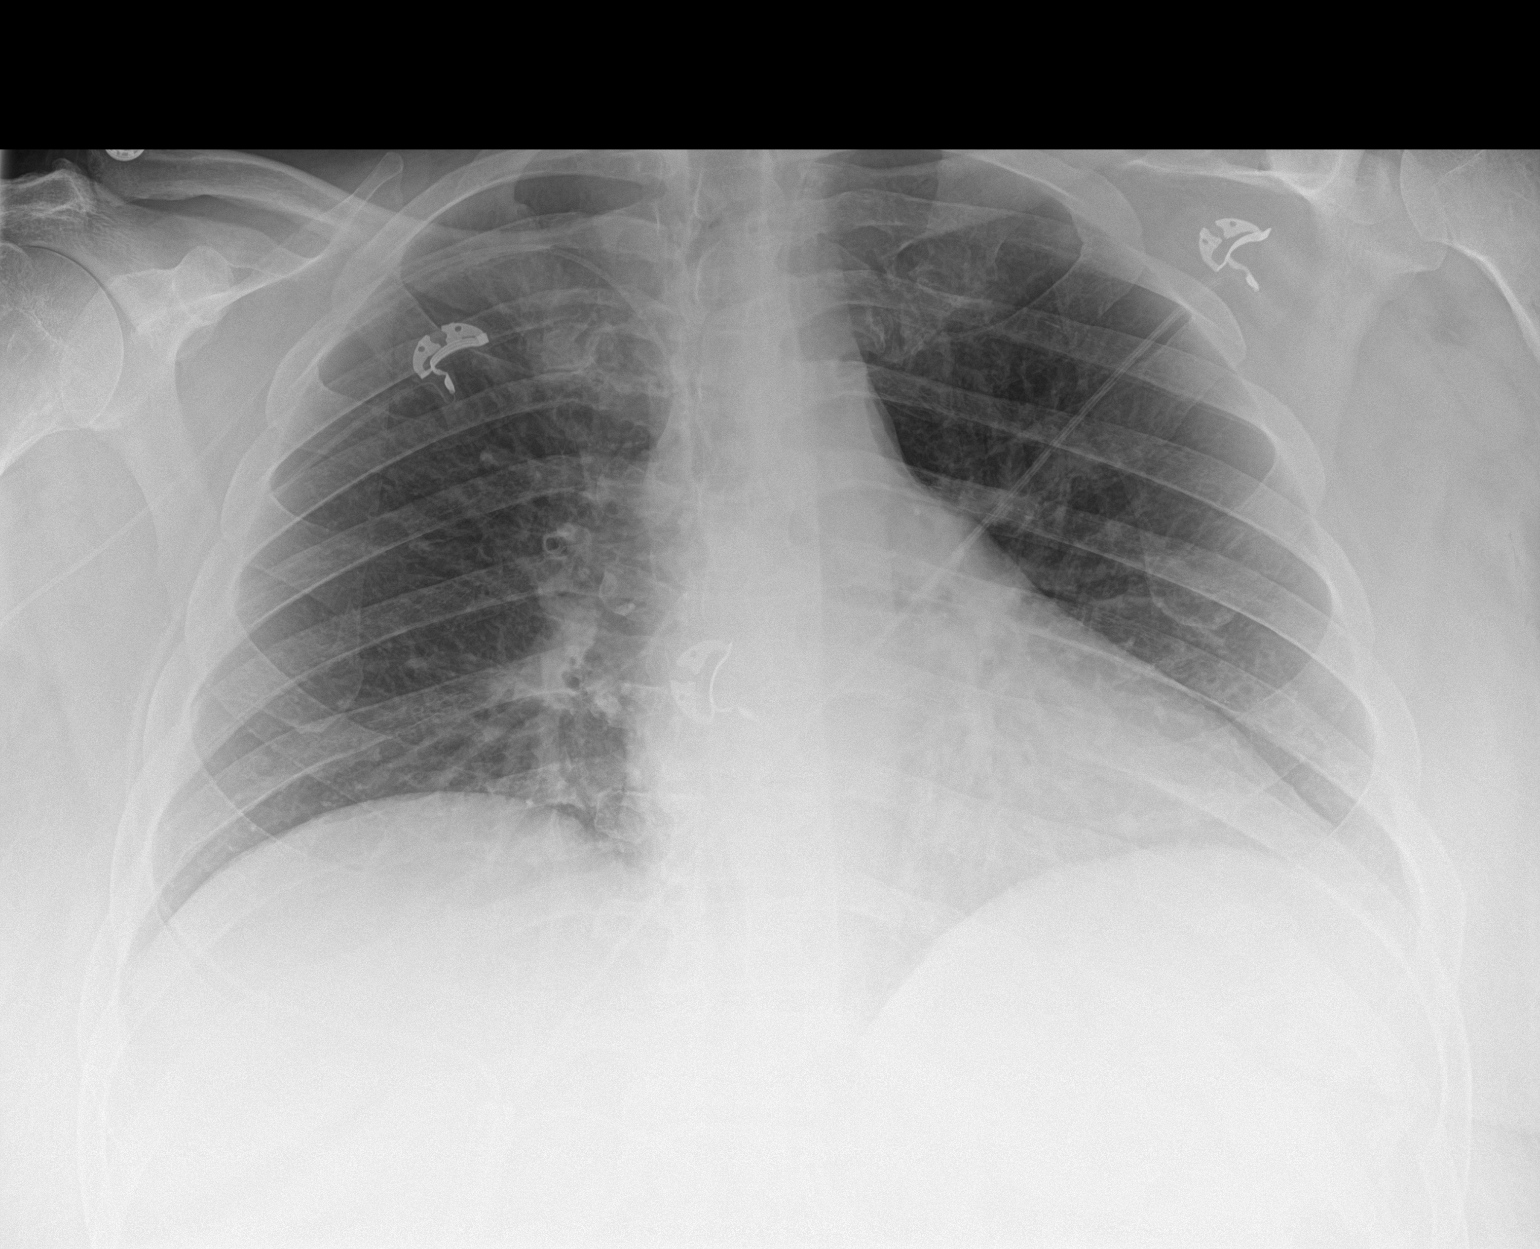

[1 of 1 positions shown; findings below may reference images not displayed]

FINDINGS: Patient has RIGHT-sided PICC line, tip overlying the level of the
superior vena cava. Heart is enlarged and accentuated by AP position
of patient. There is minimal atelectasis in the MEDIAL lung bases.
No pneumothorax or consolidation.
IMPRESSION: Interval repositioning of RIGHT-sided PICC line. Tip now overlies
the superior vena cava.

Mild bibasilar atelectasis.

## 2020-06-14 IMAGING — CT CT ABDOMEN W/ CM
3 of 5 series · 15 of 46 positions shown, 17 images · IV contrast (APPLIED)
Comparison: 05/25/2019

CLINICAL DATA: Follow-up pancreatitis

EXAM:
CT ABDOMEN WITH CONTRAST
TECHNIQUE: Multidetector CT imaging of the abdomen was performed using the
standard protocol following bolus administration of intravenous
contrast.
CONTRAST:  100mL OMNIPAQUE IOHEXOL 300 MG/ML  SOLN

[Series 2: axial st · axial · 0.83mm/px · z∈[-302,-62]mm · 9 of 61 slices shown, 11 images]
[im 7/61  soft-tissue]
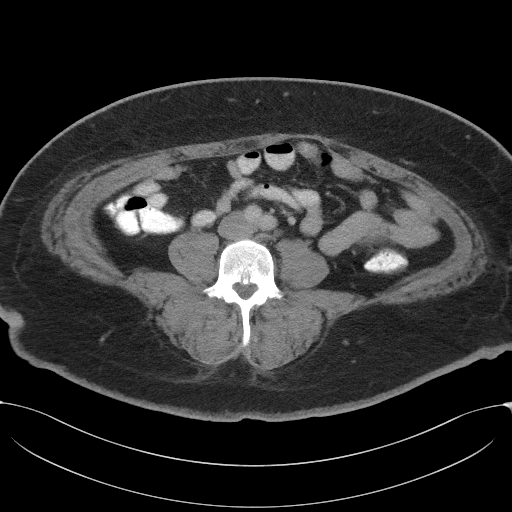
[im 7/61  bone]
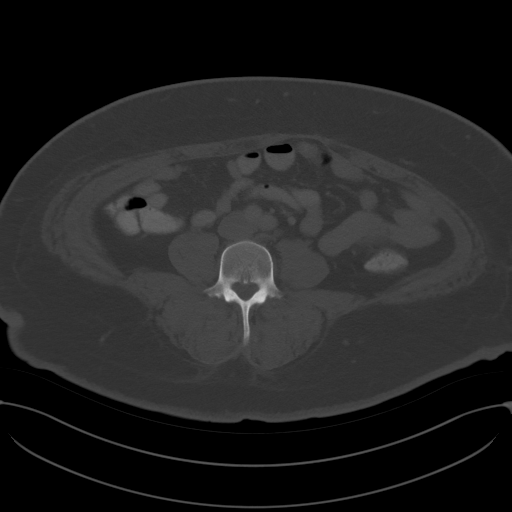
[im 13/61  soft-tissue]
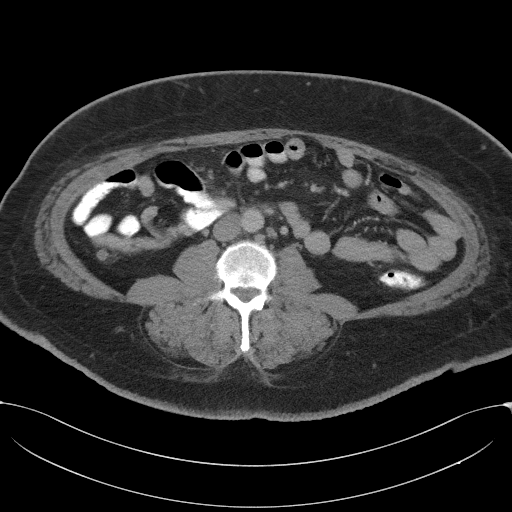
[im 19/61  soft-tissue]
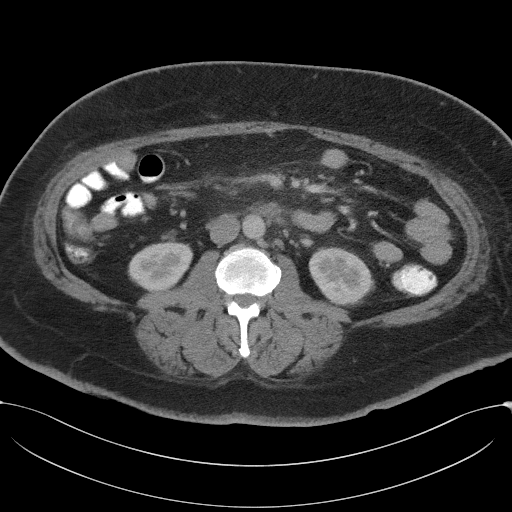
[im 25/61  soft-tissue]
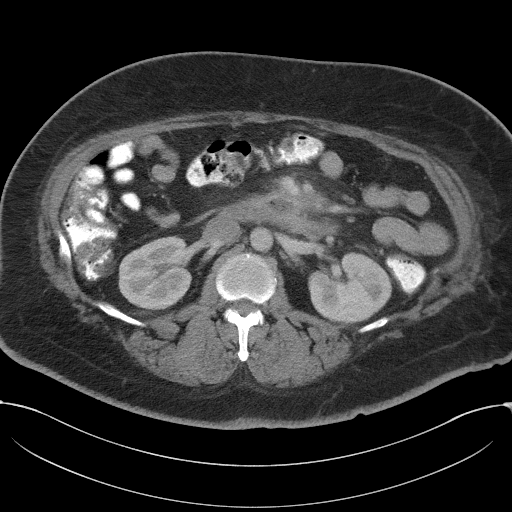
[im 31/61  soft-tissue]
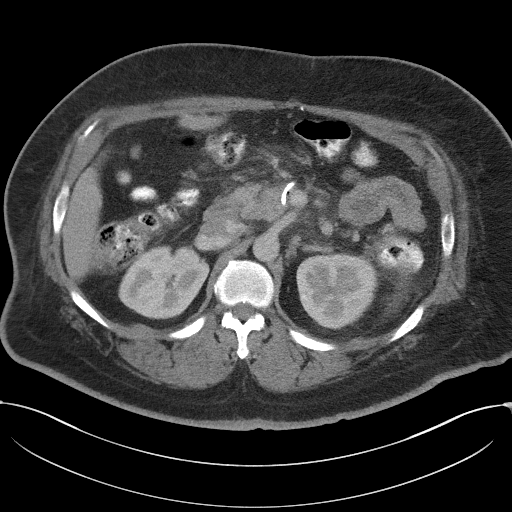
[im 37/61  soft-tissue]
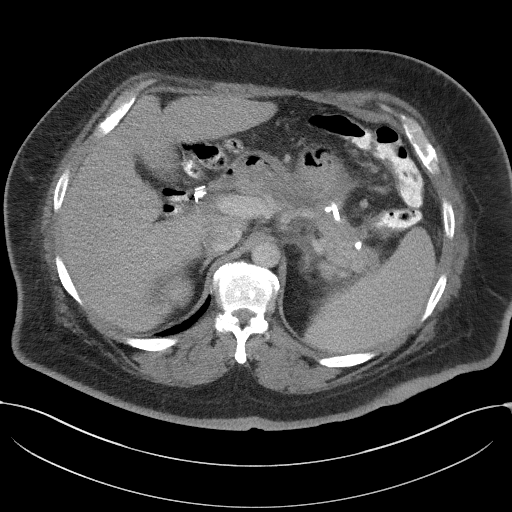
[im 43/61  soft-tissue]
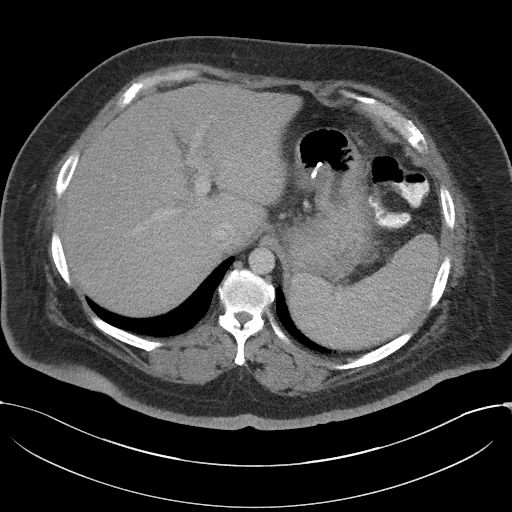
[im 49/61  soft-tissue]
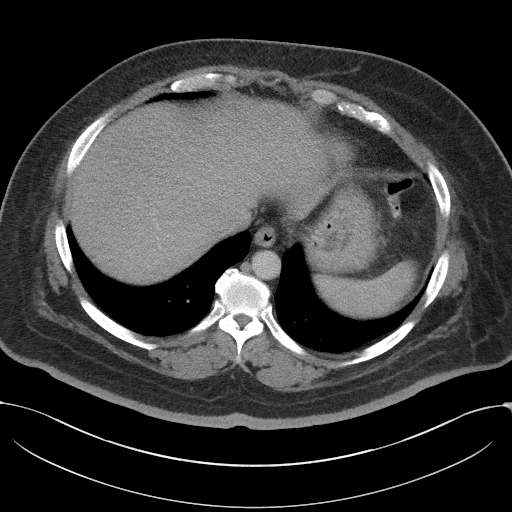
[im 55/61  soft-tissue]
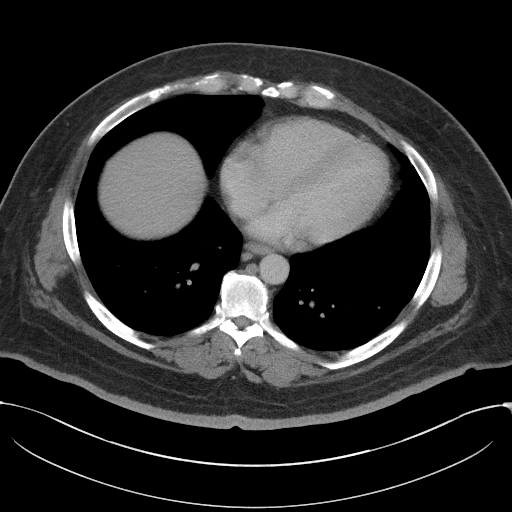
[im 55/61  bone]
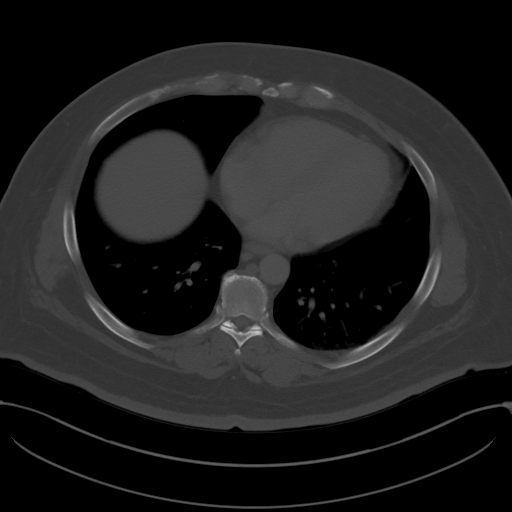

[Series 4: lung bases · axial · 0.73mm/px · z∈[-174,-130]mm · 3 of 77 slices shown]
[im 6/77  bone]
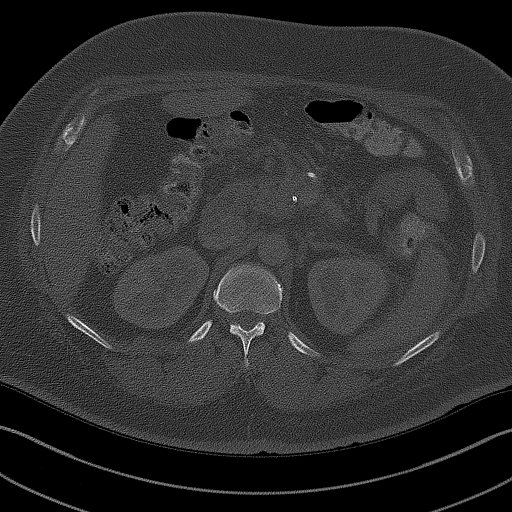
[im 17/77  bone]
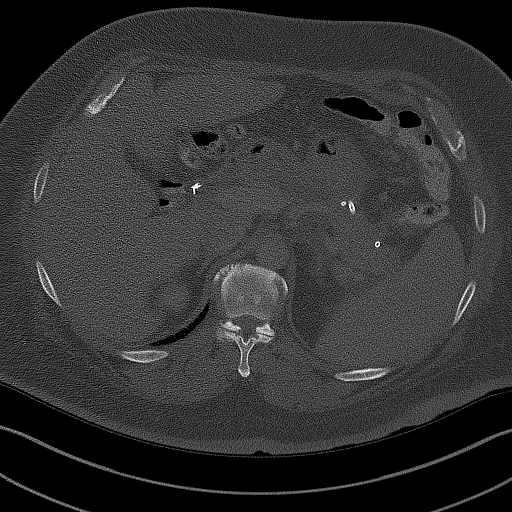
[im 28/77  bone]
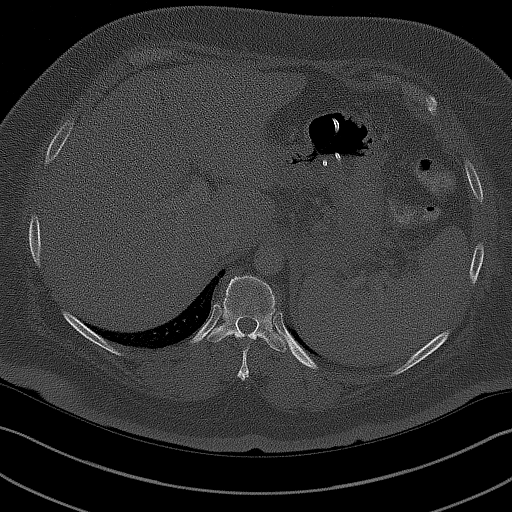

[Series 5: coronal st · coronal · 0.59mm/px · 3 of 105 slices shown]
[im 35/105  soft-tissue]
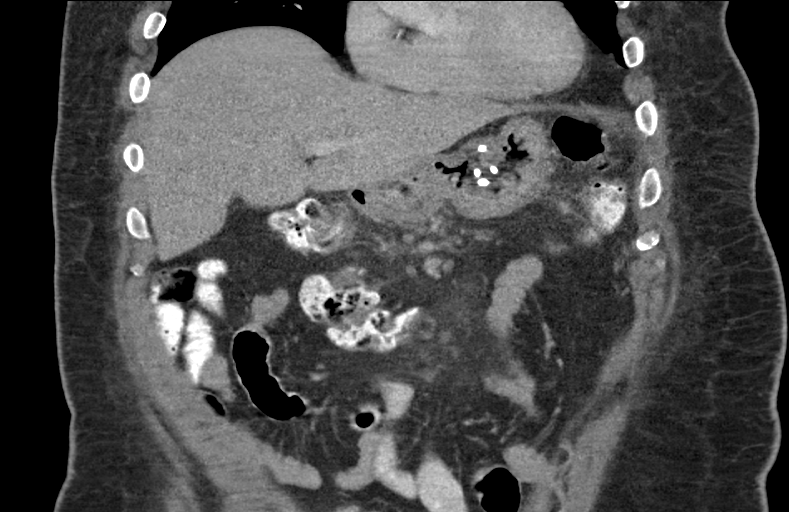
[im 47/105  soft-tissue]
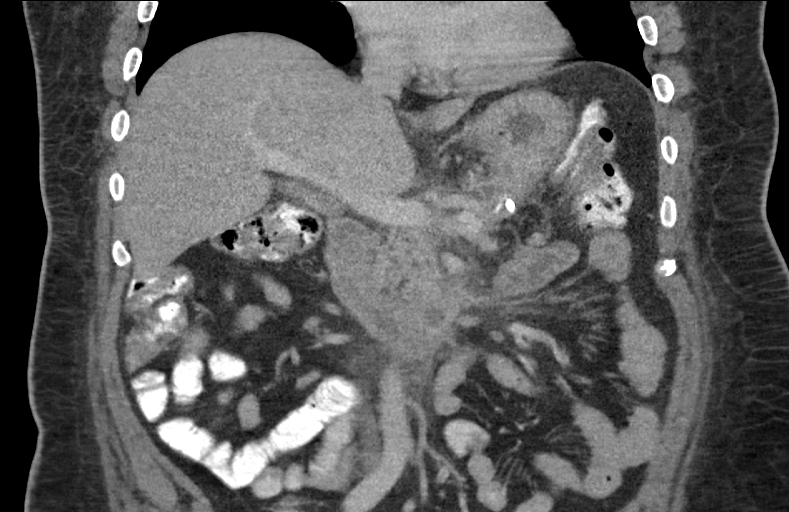
[im 58/105  soft-tissue]
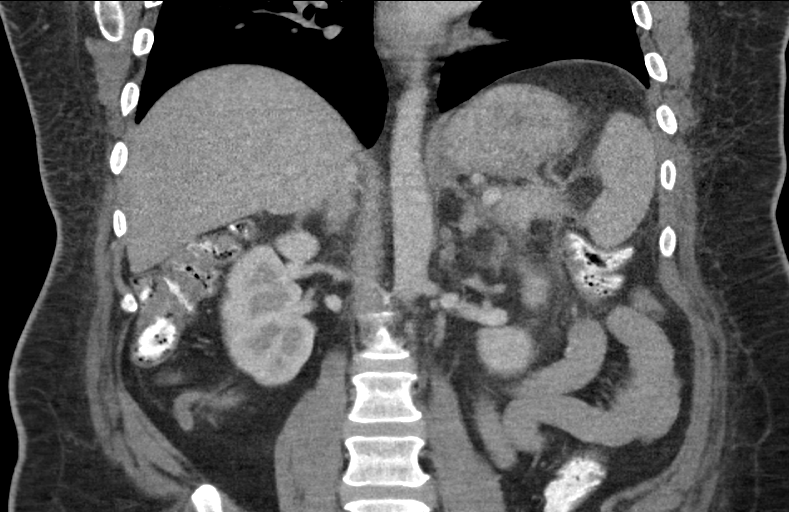

[15 of 46 positions shown; findings below may reference images not displayed]

FINDINGS: Lower chest: Calcified granuloma identified within the right lower
lobe. No acute abnormality.

Hepatobiliary: No focal liver abnormality is seen. Status post
cholecystectomy. No biliary dilatation.

Pancreas: Continued edema and peripancreatic fat stranding
associated with the pancreas. 2 transgastric drainage catheters are
again noted with distal pigtails in the head and tail of pancreas.
There has been interval resolution of previously noted fluid
collection along the tail of pancreas. The fluid and gas collection
within the uncinate process of the pancreas measures 2.1 x 1.1 cm,
image 37/2. Previously 4.8 x 2.8 cm. No new fluid collections
identified.

Spleen: Normal in size without focal abnormality.

Adrenals/Urinary Tract: Normal appearance of the adrenal glands. The
kidneys are unremarkable. No mass or hydronephrosis.

Stomach/Bowel: The stomach appears nondistended. No bowel wall
thickening, inflammation or distension noted.

Vascular/Lymphatic: Normal appearance of the abdominal aorta. No
aneurysm. The portal vein remains patent. Multiple peripancreatic
and retroperitoneal lymph nodes are non pathologically enlarged and
likely reactive.

Other: Trace perisplenic fluid. No new fluid collections identified.

Musculoskeletal: Mild thoracolumbar spondylosis.
IMPRESSION: 1. Interval resolution of previously noted fluid collection along
the tail of pancreas.
2. Interval decrease in size of fluid and gas collection within the
uncinate process of the pancreas.
3. No new fluid collections identified.

## 2021-10-18 ENCOUNTER — Encounter: Payer: Self-pay | Admitting: Neurology

## 2021-10-18 ENCOUNTER — Ambulatory Visit: Payer: BC Managed Care – PPO | Admitting: Neurology
